# Patient Record
Sex: Male | Born: 1970
Health system: Southern US, Community
[De-identification: ages and names within clinical notes are randomized; demographics above are authoritative.]

## PROBLEM LIST (undated history)

## (undated) DIAGNOSIS — E119 Type 2 diabetes mellitus without complications: Secondary | ICD-10-CM

## (undated) DIAGNOSIS — G43909 Migraine, unspecified, not intractable, without status migrainosus: Secondary | ICD-10-CM

## (undated) HISTORY — DX: Migraine, unspecified, not intractable, without status migrainosus: G43.909

---

## 2006-07-31 ENCOUNTER — Emergency Department (HOSPITAL_COMMUNITY): Admission: EM | Admit: 2006-07-31 | Discharge: 2006-07-31 | Payer: Self-pay | Admitting: Emergency Medicine

## 2007-11-21 ENCOUNTER — Emergency Department (HOSPITAL_COMMUNITY): Admission: EM | Admit: 2007-11-21 | Discharge: 2007-11-21 | Payer: Self-pay | Admitting: Emergency Medicine

## 2013-06-30 ENCOUNTER — Encounter (HOSPITAL_COMMUNITY): Payer: Self-pay | Admitting: Emergency Medicine

## 2013-06-30 ENCOUNTER — Inpatient Hospital Stay (HOSPITAL_COMMUNITY)
Admission: EM | Admit: 2013-06-30 | Discharge: 2013-07-04 | DRG: 639 | Disposition: A | Discharge status: Home / Self Care | Payer: Self-pay | Attending: Internal Medicine | Admitting: Internal Medicine

## 2013-06-30 ENCOUNTER — Emergency Department (INDEPENDENT_AMBULATORY_CARE_PROVIDER_SITE_OTHER)
Admission: EM | Admit: 2013-06-30 | Discharge: 2013-06-30 | Disposition: A | Discharge status: Other Acute Inpatient Hospital | Payer: Self-pay | Source: Home / Self Care | Attending: Family Medicine | Admitting: Family Medicine

## 2013-06-30 DIAGNOSIS — R112 Nausea with vomiting, unspecified: Secondary | ICD-10-CM | POA: Diagnosis present

## 2013-06-30 DIAGNOSIS — IMO0001 Reserved for inherently not codable concepts without codable children: Secondary | ICD-10-CM | POA: Diagnosis present

## 2013-06-30 DIAGNOSIS — E1110 Type 2 diabetes mellitus with ketoacidosis without coma: Secondary | ICD-10-CM | POA: Diagnosis present

## 2013-06-30 DIAGNOSIS — Z833 Family history of diabetes mellitus: Secondary | ICD-10-CM

## 2013-06-30 DIAGNOSIS — E1165 Type 2 diabetes mellitus with hyperglycemia: Secondary | ICD-10-CM

## 2013-06-30 DIAGNOSIS — F17200 Nicotine dependence, unspecified, uncomplicated: Secondary | ICD-10-CM | POA: Diagnosis present

## 2013-06-30 DIAGNOSIS — E785 Hyperlipidemia, unspecified: Secondary | ICD-10-CM | POA: Diagnosis present

## 2013-06-30 DIAGNOSIS — R739 Hyperglycemia, unspecified: Secondary | ICD-10-CM

## 2013-06-30 DIAGNOSIS — E860 Dehydration: Secondary | ICD-10-CM | POA: Diagnosis present

## 2013-06-30 DIAGNOSIS — Z794 Long term (current) use of insulin: Secondary | ICD-10-CM

## 2013-06-30 DIAGNOSIS — D696 Thrombocytopenia, unspecified: Secondary | ICD-10-CM | POA: Diagnosis present

## 2013-06-30 DIAGNOSIS — E1310 Other specified diabetes mellitus with ketoacidosis without coma: Principal | ICD-10-CM | POA: Diagnosis present

## 2013-06-30 DIAGNOSIS — R7309 Other abnormal glucose: Secondary | ICD-10-CM

## 2013-06-30 LAB — URINALYSIS, ROUTINE W REFLEX MICROSCOPIC
Bilirubin Urine: NEGATIVE
Glucose, UA: >1000 mg/dL — AB
Hgb urine dipstick: NEGATIVE
Ketones, ur: >80 mg/dL — AB
Leukocytes, UA: NEGATIVE
Nitrite: NEGATIVE
Protein, ur: NEGATIVE mg/dL
Specific Gravity, Urine: 1.037 — ABNORMAL HIGH (ref 1.005–1.030)
Urobilinogen, UA: 0.2 mg/dL (ref 0.0–1.0)
pH: 5 (ref 5.0–8.0)

## 2013-06-30 LAB — POCT I-STAT, CHEM 8
BUN: 14 mg/dL (ref 6–23)
Calcium, Ion: 1.27 mmol/L — ABNORMAL HIGH (ref 1.12–1.23)
Chloride: 104 mEq/L (ref 96–112)
Creatinine, Ser: 0.9 mg/dL (ref 0.50–1.35)
Glucose, Bld: 352 mg/dL — ABNORMAL HIGH (ref 70–99)
HCT: 58 % — ABNORMAL HIGH (ref 39.0–52.0)
Hemoglobin: 19.7 g/dL — ABNORMAL HIGH (ref 13.0–17.0)
Potassium: 4.8 mEq/L (ref 3.7–5.3)
Sodium: 137 mEq/L (ref 137–147)
TCO2: 15 mmol/L (ref 0–100)

## 2013-06-30 LAB — POCT URINALYSIS DIP (DEVICE)
Glucose, UA: 500 mg/dL — AB
Ketones, ur: 80 mg/dL — AB
Leukocytes, UA: NEGATIVE
Nitrite: NEGATIVE
Protein, ur: 100 mg/dL — AB
Specific Gravity, Urine: >=1.03 (ref 1.005–1.030)
Urobilinogen, UA: 0.2 mg/dL (ref 0.0–1.0)
pH: 5.5 (ref 5.0–8.0)

## 2013-06-30 LAB — POCT I-STAT 3, VENOUS BLOOD GAS (G3P V)
Acid-base deficit: 13 mmol/L — ABNORMAL HIGH (ref 0.0–2.0)
Bicarbonate: 11.7 mEq/L — ABNORMAL LOW (ref 20.0–24.0)
O2 Saturation: 94 %
TCO2: 12 mmol/L (ref 0–100)
pCO2, Ven: 25.7 mmHg — ABNORMAL LOW (ref 45.0–50.0)
pH, Ven: 7.268 (ref 7.250–7.300)
pO2, Ven: 81 mmHg — ABNORMAL HIGH (ref 30.0–45.0)

## 2013-06-30 LAB — CBC WITH DIFFERENTIAL/PLATELET
Basophils Absolute: 0.1 10*3/uL (ref 0.0–0.1)
Basophils Relative: 1 % (ref 0–1)
Eosinophils Absolute: 0.1 10*3/uL (ref 0.0–0.7)
Eosinophils Relative: 1 % (ref 0–5)
HCT: 51.5 % (ref 39.0–52.0)
Hemoglobin: 17.2 g/dL — ABNORMAL HIGH (ref 13.0–17.0)
Lymphocytes Relative: 44 % (ref 12–46)
Lymphs Abs: 2.5 10*3/uL (ref 0.7–4.0)
MCH: 26.9 pg (ref 26.0–34.0)
MCHC: 33.4 g/dL (ref 30.0–36.0)
MCV: 80.5 fL (ref 78.0–100.0)
Monocytes Absolute: 0.4 10*3/uL (ref 0.1–1.0)
Monocytes Relative: 8 % (ref 3–12)
Neutro Abs: 2.6 10*3/uL (ref 1.7–7.7)
Neutrophils Relative %: 46 % (ref 43–77)
Platelets: 150 10*3/uL (ref 150–400)
RBC: 6.4 MIL/uL — ABNORMAL HIGH (ref 4.22–5.81)
RDW: 13.6 % (ref 11.5–15.5)
WBC: 5.6 10*3/uL (ref 4.0–10.5)

## 2013-06-30 LAB — GLUCOSE, CAPILLARY
Glucose-Capillary: 386 mg/dL — ABNORMAL HIGH (ref 70–99)
Glucose-Capillary: 347 mg/dL — ABNORMAL HIGH (ref 70–99)
Glucose-Capillary: 361 mg/dL — ABNORMAL HIGH (ref 70–99)

## 2013-06-30 LAB — COMPREHENSIVE METABOLIC PANEL
ALT: 28 U/L (ref 0–53)
AST: 24 U/L (ref 0–37)
Albumin: 4.5 g/dL (ref 3.5–5.2)
Alkaline Phosphatase: 110 U/L (ref 39–117)
BUN: 14 mg/dL (ref 6–23)
CO2: 12 mEq/L — ABNORMAL LOW (ref 19–32)
Calcium: 9.5 mg/dL (ref 8.4–10.5)
Chloride: 94 mEq/L — ABNORMAL LOW (ref 96–112)
Creatinine, Ser: 0.83 mg/dL (ref 0.50–1.35)
GFR calc Af Amer: >90 mL/min (ref >90)
GFR calc non Af Amer: >90 mL/min (ref >90)
Glucose, Bld: 359 mg/dL — ABNORMAL HIGH (ref 70–99)
Potassium: 5 mEq/L (ref 3.7–5.3)
Sodium: 137 mEq/L (ref 137–147)
Total Bilirubin: 0.6 mg/dL (ref 0.3–1.2)
Total Protein: 8.5 g/dL — ABNORMAL HIGH (ref 6.0–8.3)

## 2013-06-30 LAB — URINE MICROSCOPIC-ADD ON

## 2013-06-30 MED ORDER — SODIUM CHLORIDE 0.9 % IV SOLN
INTRAVENOUS | Status: AC
  Administered 2013-07-01: 19:00:00 via INTRAVENOUS
  Filled 2013-06-30: qty 1

## 2013-06-30 MED ORDER — ENOXAPARIN SODIUM 40 MG/0.4ML ~~LOC~~ SOLN
40.0000 mg | SUBCUTANEOUS | Status: DC
  Administered 2013-07-01 – 2013-07-03 (×3): 40 mg via SUBCUTANEOUS
  Filled 2013-06-30 (×3): qty 0.4

## 2013-06-30 MED ORDER — DEXTROSE 50 % IV SOLN
25.0000 mL | INTRAVENOUS | Status: DC | PRN
Start: 2013-06-30 — End: 2013-07-02

## 2013-06-30 MED ORDER — DEXTROSE-NACL 5-0.45 % IV SOLN: INTRAVENOUS | Status: DC

## 2013-06-30 MED ORDER — SODIUM CHLORIDE 0.9 % IV SOLN
INTRAVENOUS | Status: DC
  Administered 2013-06-30: 2.9 [IU]/h via INTRAVENOUS
  Filled 2013-06-30 (×2): qty 1

## 2013-06-30 MED ORDER — SODIUM CHLORIDE 0.9 % IV SOLN: INTRAVENOUS | Status: DC

## 2013-06-30 MED ORDER — SODIUM CHLORIDE 0.9 % IV BOLUS (SEPSIS)
1000.0000 mL | Freq: Once | INTRAVENOUS | Status: AC
  Administered 2013-06-30: 1000 mL via INTRAVENOUS

## 2013-06-30 MED ORDER — SODIUM CHLORIDE 0.9 % IV SOLN
1000.0000 mL | INTRAVENOUS | Status: DC
  Administered 2013-06-30: 1000 mL via INTRAVENOUS

## 2013-06-30 MED ORDER — SODIUM CHLORIDE 0.9 % IV SOLN
INTRAVENOUS | Status: DC
  Administered 2013-06-30: 23:00:00 via INTRAVENOUS

## 2013-06-30 MED ORDER — POTASSIUM CHLORIDE 10 MEQ/100ML IV SOLN
10.0000 meq | INTRAVENOUS | Status: AC
  Administered 2013-06-30 – 2013-07-01 (×4): 10 meq via INTRAVENOUS
  Filled 2013-06-30 (×4): qty 100

## 2013-06-30 MED ORDER — DEXTROSE-NACL 5-0.45 % IV SOLN
INTRAVENOUS | Status: AC
  Administered 2013-07-01 (×2): via INTRAVENOUS

## 2013-06-30 NOTE — ED Provider Notes (Signed)
CSN: [id number]     Arrival date & time 06/30/13  1502 History   First MD Initiated Contact with Patient 06/30/13 1626     Chief Complaint  Patient presents with  . Emesis   (Consider location/radiation/quality/duration/timing/severity/associated sxs/prior Treatment) HPI Comments: Patient presents with 3 days of nausea, vomiting, loose BMs, polyuria and polydipsia. Denies fever, abdominal pain, known ill contacts. States symptoms are mode worse when he tries to eat or drink. States there is little he can do to make symptoms better. No dysuria or hematuria. No URI sx. Reports difficulty keeping clear liquids down over past 24-48 hours.    The history is provided by the patient.    History reviewed. No pertinent past medical history. History reviewed. No pertinent past surgical history. History reviewed. No pertinent family history. History  Substance Use Topics  . Smoking status: Current Every Day Smoker  . Smokeless tobacco: Not on file  . Alcohol Use: Yes    Review of Systems  Constitutional: Positive for appetite change and fatigue.  HENT: Negative.   Eyes: Negative.   Respiratory: Negative.   Cardiovascular: Negative.   Gastrointestinal: Positive for nausea, vomiting and diarrhea. Negative for constipation and blood in stool.  Endocrine: Negative for polydipsia and polyuria.  Genitourinary: Positive for urgency and frequency. Negative for dysuria and hematuria.  Musculoskeletal: Negative.   Skin: Negative.   Allergic/Immunologic: Negative for immunocompromised state.  Neurological: Positive for dizziness. Negative for seizures, light-headedness and headaches.  Psychiatric/Behavioral: Negative for confusion.    Allergies  Review of patient's allergies indicates no known allergies.  Home Medications  No current outpatient prescriptions on file. There were no vitals taken for this visit. Physical Exam  Nursing note and vitals reviewed. Constitutional: He is oriented to  person, place, and time. He appears well-developed and well-nourished. No distress.  HENT:  Head: Normocephalic and atraumatic.  Nose: Nose normal.  Mouth/Throat: Oropharynx is clear and moist.  Eyes: Conjunctivae are normal. No scleral icterus.  Neck: Normal range of motion. Neck supple.  Cardiovascular: Regular rhythm and normal heart sounds.   Mild tachycardia  Pulmonary/Chest: Effort normal and breath sounds normal. No respiratory distress. He has no wheezes.  Abdominal: Soft. He exhibits no distension. There is no tenderness.  +hypoactive BS  Musculoskeletal: Normal range of motion.  Neurological: He is alert and oriented to person, place, and time.  Skin: Skin is warm and dry.  Psychiatric: He has a normal mood and affect. His behavior is normal.    ED Course  Procedures (including critical care time) Labs Review Labs Reviewed - No data to display Imaging Review No results found.    MDM  History and labs and suggest new onset diabetes with dehydration and ketonuria. Patient states he is not surprised given that his four brothers are diabetic patients. Will transfer to [facility] for re-hydration and glucose stabilization. Given outpatient resource information prior to transfer.     Jess Barters [place], [place] 06/30/13 1740

## 2013-06-30 NOTE — H&P (Signed)
[facility] History and Physical  Joshua Escobar DOB: Apr 23, 1971 DOA: 06/30/2013  Referring physician: ER physician. PCP: No PCP Per Patient   Chief Complaint: Polydipsia and polyuria.  HPI: Joshua Escobar is a 43 y.o. male with no significant past medical history presented to the urgent care at [facility] with complaints of polyuria and polydipsia. Patient has been experiencing these symptoms for a week. Patient also has nausea vomiting yesterday. Denies any chest pain shortness of breath abdominal pain or diarrhea. Patient's blood sugar was found to be elevated and was referred to the ER. In the ER patient's labs show elevated anion gap with elevated blood sugar and is being admitted for diabetic ketoacidosis and new-onset diabetes mellitus type 2.   Review of Systems: As presented in the history of presenting illness, rest negative.  Past Medical History  Diagnosis Date  . Medical history non-contributory    Past Surgical History  Procedure Laterality Date  . No past surgeries     Social History:  reports that he has been smoking.  He does not have any smokeless tobacco history on file. He reports that he drinks alcohol. His drug history is not on file. Where does patient live home. Can patient participate in ADLs? Yes.  No Known Allergies  Family History:  Family History  Problem Relation Age of Onset  . Diabetes Mellitus II Sister   . Diabetes Mellitus II Brother       Prior to Admission medications   Not on File    Physical Exam: Filed Vitals:   06/30/13 2045 06/30/13 2115 06/30/13 2144 06/30/13 2145  BP: 131/92 137/99 131/91 128/89  Pulse: 93 95 92 90  Temp:      TempSrc:      Resp:   20   Weight:      SpO2: 98% 97% 100% 100%     General:  Well-developed and nourished.  Eyes: Anicteric no pallor.  ENT: No discharge from ears eyes nose mouth.  Neck: No mass felt.  Cardiovascular: S1-S2 heard.  Respiratory: No rhonchi  or crepitations.  Abdomen: Soft nontender bowel sounds present.  Skin: No rash.  Musculoskeletal: No edema.  Psychiatric: Appears normal.  Neurologic: Alert awake oriented to time place and person. Moves all extremities.  Labs on Admission:  Basic Metabolic Panel:  Recent Labs Lab 06/30/13 1702 06/30/13 1805  NA 137 137  K 4.8 5.0  CL 104 94*  CO2  --  12*  GLUCOSE 352* 359*  BUN 14 14  CREATININE 0.90 0.83  CALCIUM  --  9.5   Liver Function Tests:  Recent Labs Lab 06/30/13 1805  AST 24  ALT 28  ALKPHOS 110  BILITOT 0.6  PROT 8.5*  ALBUMIN 4.5   No results found for this basename: LIPASE, AMYLASE,  in the last 168 hours No results found for this basename: AMMONIA,  in the last 168 hours CBC:  Recent Labs Lab 06/30/13 1702 06/30/13 1805  WBC  --  5.6  NEUTROABS  --  2.6  HGB 19.7* 17.2*  HCT 58.0* 51.5  MCV  --  80.5  PLT  --  150   Cardiac Enzymes: No results found for this basename: CKTOTAL, CKMB, CKMBINDEX, TROPONINI,  in the last 168 hours  BNP (last 3 results) No results found for this basename: PROBNP,  in the last 8760 hours CBG:  Recent Labs Lab 06/30/13 1803 06/30/13 2132 06/30/13 2229  GLUCAP 386* 361* 347*  Radiological Exams on Admission: No results found.   Assessment/Plan Principal Problem:   Diabetic ketoacidosis Active Problems:   Nausea with vomiting   DKA (diabetic ketoacidoses)   1. DKA with new-onset diabetes mellitus - probably diabetes mellitus type 2. Patient has been placed on IV insulin infusion with aggressive IV hydration. Change to subcutaneous Lantus once anion gap gets corrected. I have ordered anti-glutamic acid decarboxylase antibodies and C-peptide to differentiate type I and type 2 diabetes mellitus. Closely follow metabolic panel. Check hemoglobin A1c. Lipid panel. TSH. 2. Nausea vomiting probably from diabetes mellitus - patient abdomen appears benign. Check LFTs. 3. Tobacco abuse - strongly  advised to quit smoking.    Code Status: Full code.  Family Communication: None.  Disposition Plan: Admit to inpatient.    KAKRAKANDY,ARSHAD N. [facility] Pager [phone].  If 7PM-7AM, please contact night-coverage [email or web address] Password [email or web address] 06/30/2013, 10:37 PM

## 2013-06-30 NOTE — ED Notes (Addendum)
C/o vomiting x past 3 days, reportedly unable to keep down anything . Last stool 2-3 days ago. NAD at present. Denies pain

## 2013-06-30 NOTE — ED Provider Notes (Signed)
CSN: [id number]     Arrival date & time 06/30/13  1737 History   First MD Initiated Contact with Patient 06/30/13 2000     Chief Complaint  Patient presents with  . Emesis   (Consider location/radiation/quality/duration/timing/severity/associated sxs/prior Treatment) Patient is a 43 y.o. male presenting with vomiting. The history is provided by the patient.  Emesis  patient here complaining of 3 days of worsening polyuria, polydipsia as well as emesis. Went to urgent care Center and was diagnosed with new-onset diabetes. He does note recent weight loss. Has a strong family history of diabetes. Currently denies any diarrhea. Does note some weakness. No urinary symptoms of dysuria or hematuria. No vomiting for the past 48 hours. Denies any abdominal pain. No treatment used prior to arrival and nothing makes her symptoms better worse in no prior history of same.  History reviewed. No pertinent past medical history. History reviewed. No pertinent past surgical history. History reviewed. No pertinent family history. History  Substance Use Topics  . Smoking status: Current Every Day Smoker  . Smokeless tobacco: Not on file  . Alcohol Use: Yes    Review of Systems  Gastrointestinal: Positive for vomiting.  All other systems reviewed and are negative.    Allergies  Review of patient's allergies indicates no known allergies.  Home Medications  No current outpatient prescriptions on file. BP 140/92  Pulse 100  Temp(Src) 98.3 F (36.8 C) (Oral)  Resp 20  Wt 183 lb 7 oz (83.207 kg)  SpO2 100% Physical Exam  Nursing note and vitals reviewed. Constitutional: He is oriented to person, place, and time. He appears well-developed and well-nourished.  Non-toxic appearance. No distress.  HENT:  Head: Normocephalic and atraumatic.  Eyes: Conjunctivae, EOM and lids are normal. Pupils are equal, round, and reactive to light.  Neck: Normal range of motion. Neck supple. No tracheal deviation  present. No mass present.  Cardiovascular: Regular rhythm and normal heart sounds.  Tachycardia present.  Exam reveals no gallop.   No murmur heard. Pulmonary/Chest: Effort normal and breath sounds normal. No stridor. No respiratory distress. He has no decreased breath sounds. He has no wheezes. He has no rhonchi. He has no rales.  Abdominal: Soft. Normal appearance and bowel sounds are normal. He exhibits no distension. There is no tenderness. There is no rebound and no CVA tenderness.  Musculoskeletal: Normal range of motion. He exhibits no edema and no tenderness.  Neurological: He is alert and oriented to person, place, and time. He has normal strength. No cranial nerve deficit or sensory deficit. GCS eye subscore is 4. GCS verbal subscore is 5. GCS motor subscore is 6.  Skin: Skin is warm and dry. No abrasion and no rash noted.  Psychiatric: He has a normal mood and affect. His speech is normal and behavior is normal.    ED Course  Procedures (including critical care time) Labs Review Labs Reviewed  COMPREHENSIVE METABOLIC PANEL - Abnormal; Notable for the following:    Chloride 94 (*)    CO2 12 (*)    Glucose, Bld 359 (*)    Total Protein 8.5 (*)    All other components within normal limits  CBC WITH DIFFERENTIAL - Abnormal; Notable for the following:    RBC 6.40 (*)    Hemoglobin 17.2 (*)    All other components within normal limits  GLUCOSE, CAPILLARY - Abnormal; Notable for the following:    Glucose-Capillary 386 (*)    All other components within normal limits  URINALYSIS,  ROUTINE W REFLEX MICROSCOPIC  BLOOD GAS, VENOUS   Imaging Review No results found.  EKG Interpretation   None       MDM  No diagnosis found. Patient given IV fluids here and will be started on a glucose stabilizer and admitted to step down   Toy Baker, MD 06/30/13 2120

## 2013-06-30 NOTE — ED Provider Notes (Signed)
Medical screening examination/treatment/procedure(s) were performed by resident physician or non-physician practitioner and as supervising physician I was immediately available for consultation/collaboration.   KINDL,JAMES DOUGLAS MD.   James D Kindl, MD 06/30/13 2057 

## 2013-06-30 NOTE — ED Notes (Addendum)
Presents from urgent care center with increased urination, thirst and weight loss assocaited with nausea and emesis over the past 3 days. Seen at [facility] and told he was diabetic, sent here for further work up. Alert, oriented, answering all questions appropriately.

## 2013-06-30 NOTE — Progress Notes (Signed)
[facility] ED CM noted patient to be without a [facility]. Pt presented to ED from Urgent Care with s/s of DM for further evaluation. In room to meet with patient. Pt reports not having a PCP but he does have health insurance through his employer [employer].  Pt informed that he will need to call [employer] number is on the card to find a PCP in the network. Pt verbalized understanding and agrees with plan. Further evaluation pending. Disposition plan pending further eval. Pt was admitted 5W

## 2013-07-01 LAB — BASIC METABOLIC PANEL
BUN: 12 mg/dL (ref 6–23)
BUN: 10 mg/dL (ref 6–23)
BUN: 10 mg/dL (ref 6–23)
BUN: 11 mg/dL (ref 6–23)
BUN: 9 mg/dL (ref 6–23)
BUN: 8 mg/dL (ref 6–23)
CO2: 15 mEq/L — ABNORMAL LOW (ref 19–32)
CO2: 19 mEq/L (ref 19–32)
CO2: 19 mEq/L (ref 19–32)
CO2: 20 mEq/L (ref 19–32)
CO2: 24 mEq/L (ref 19–32)
CO2: 20 mEq/L (ref 19–32)
Calcium: 9 mg/dL (ref 8.4–10.5)
Calcium: 9 mg/dL (ref 8.4–10.5)
Calcium: 8.8 mg/dL (ref 8.4–10.5)
Calcium: 8.8 mg/dL (ref 8.4–10.5)
Calcium: 8.9 mg/dL (ref 8.4–10.5)
Calcium: 8.8 mg/dL (ref 8.4–10.5)
Chloride: 101 mEq/L (ref 96–112)
Chloride: 106 mEq/L (ref 96–112)
Chloride: 103 mEq/L (ref 96–112)
Chloride: 98 mEq/L (ref 96–112)
Chloride: 101 mEq/L (ref 96–112)
Chloride: 97 mEq/L (ref 96–112)
Creatinine, Ser: 0.9 mg/dL (ref 0.50–1.35)
Creatinine, Ser: 0.75 mg/dL (ref 0.50–1.35)
Creatinine, Ser: 0.74 mg/dL (ref 0.50–1.35)
Creatinine, Ser: 0.8 mg/dL (ref 0.50–1.35)
Creatinine, Ser: 0.73 mg/dL (ref 0.50–1.35)
Creatinine, Ser: 0.7 mg/dL (ref 0.50–1.35)
GFR calc Af Amer: >90 mL/min (ref >90)
GFR calc Af Amer: >90 mL/min (ref >90)
GFR calc Af Amer: >90 mL/min (ref >90)
GFR calc Af Amer: >90 mL/min (ref >90)
GFR calc Af Amer: >90 mL/min (ref >90)
GFR calc Af Amer: >90 mL/min (ref >90)
GFR calc non Af Amer: >90 mL/min (ref >90)
GFR calc non Af Amer: >90 mL/min (ref >90)
GFR calc non Af Amer: >90 mL/min (ref >90)
GFR calc non Af Amer: >90 mL/min (ref >90)
GFR calc non Af Amer: >90 mL/min (ref >90)
GFR calc non Af Amer: >90 mL/min (ref >90)
Glucose, Bld: 282 mg/dL — ABNORMAL HIGH (ref 70–99)
Glucose, Bld: 136 mg/dL — ABNORMAL HIGH (ref 70–99)
Glucose, Bld: 153 mg/dL — ABNORMAL HIGH (ref 70–99)
Glucose, Bld: 206 mg/dL — ABNORMAL HIGH (ref 70–99)
Glucose, Bld: 109 mg/dL — ABNORMAL HIGH (ref 70–99)
Glucose, Bld: 110 mg/dL — ABNORMAL HIGH (ref 70–99)
Potassium: 4.4 mEq/L (ref 3.7–5.3)
Potassium: 3.9 mEq/L (ref 3.7–5.3)
Potassium: 4.2 mEq/L (ref 3.7–5.3)
Potassium: 3.7 mEq/L (ref 3.7–5.3)
Potassium: 3.7 mEq/L (ref 3.7–5.3)
Potassium: 3.4 mEq/L — ABNORMAL LOW (ref 3.7–5.3)
Sodium: 141 mEq/L (ref 137–147)
Sodium: 143 mEq/L (ref 137–147)
Sodium: 139 mEq/L (ref 137–147)
Sodium: 135 mEq/L — ABNORMAL LOW (ref 137–147)
Sodium: 136 mEq/L — ABNORMAL LOW (ref 137–147)
Sodium: 134 mEq/L — ABNORMAL LOW (ref 137–147)

## 2013-07-01 LAB — CBC
HCT: 46.8 % (ref 39.0–52.0)
Hemoglobin: 15.2 g/dL (ref 13.0–17.0)
MCH: 26.3 pg (ref 26.0–34.0)
MCHC: 32.5 g/dL (ref 30.0–36.0)
MCV: 80.8 fL (ref 78.0–100.0)
Platelets: 132 10*3/uL — ABNORMAL LOW (ref 150–400)
RBC: 5.79 MIL/uL (ref 4.22–5.81)
RDW: 13.5 % (ref 11.5–15.5)
WBC: 5.5 10*3/uL (ref 4.0–10.5)

## 2013-07-01 LAB — GLUCOSE, CAPILLARY
Glucose-Capillary: 121 mg/dL — ABNORMAL HIGH (ref 70–99)
Glucose-Capillary: 127 mg/dL — ABNORMAL HIGH (ref 70–99)
Glucose-Capillary: 137 mg/dL — ABNORMAL HIGH (ref 70–99)
Glucose-Capillary: 143 mg/dL — ABNORMAL HIGH (ref 70–99)
Glucose-Capillary: 143 mg/dL — ABNORMAL HIGH (ref 70–99)
Glucose-Capillary: 143 mg/dL — ABNORMAL HIGH (ref 70–99)
Glucose-Capillary: 153 mg/dL — ABNORMAL HIGH (ref 70–99)
Glucose-Capillary: 163 mg/dL — ABNORMAL HIGH (ref 70–99)
Glucose-Capillary: 169 mg/dL — ABNORMAL HIGH (ref 70–99)
Glucose-Capillary: 184 mg/dL — ABNORMAL HIGH (ref 70–99)
Glucose-Capillary: 185 mg/dL — ABNORMAL HIGH (ref 70–99)
Glucose-Capillary: 191 mg/dL — ABNORMAL HIGH (ref 70–99)
Glucose-Capillary: 214 mg/dL — ABNORMAL HIGH (ref 70–99)
Glucose-Capillary: 223 mg/dL — ABNORMAL HIGH (ref 70–99)
Glucose-Capillary: 233 mg/dL — ABNORMAL HIGH (ref 70–99)
Glucose-Capillary: 275 mg/dL — ABNORMAL HIGH (ref 70–99)
Glucose-Capillary: 296 mg/dL — ABNORMAL HIGH (ref 70–99)
Glucose-Capillary: 299 mg/dL — ABNORMAL HIGH (ref 70–99)
Glucose-Capillary: 89 mg/dL (ref 70–99)
Glucose-Capillary: 89 mg/dL (ref 70–99)
Glucose-Capillary: 90 mg/dL (ref 70–99)
Glucose-Capillary: 98 mg/dL (ref 70–99)
Glucose-Capillary: 99 mg/dL (ref 70–99)

## 2013-07-01 LAB — COMPREHENSIVE METABOLIC PANEL
ALT: 22 U/L (ref 0–53)
AST: 17 U/L (ref 0–37)
Albumin: 3.6 g/dL (ref 3.5–5.2)
Alkaline Phosphatase: 87 U/L (ref 39–117)
BUN: 11 mg/dL (ref 6–23)
CO2: 20 mEq/L (ref 19–32)
Calcium: 8.9 mg/dL (ref 8.4–10.5)
Chloride: 102 mEq/L (ref 96–112)
Creatinine, Ser: 0.92 mg/dL (ref 0.50–1.35)
GFR calc Af Amer: >90 mL/min (ref >90)
GFR calc non Af Amer: >90 mL/min (ref >90)
Glucose, Bld: 203 mg/dL — ABNORMAL HIGH (ref 70–99)
Potassium: 4 mEq/L (ref 3.7–5.3)
Sodium: 140 mEq/L (ref 137–147)
Total Bilirubin: 0.5 mg/dL (ref 0.3–1.2)
Total Protein: 6.8 g/dL (ref 6.0–8.3)

## 2013-07-01 LAB — LIPID PANEL
Cholesterol: 205 mg/dL — ABNORMAL HIGH (ref 0–200)
HDL: 43 mg/dL (ref >39)
LDL Cholesterol: 145 mg/dL — ABNORMAL HIGH (ref 0–99)
Total CHOL/HDL Ratio: 4.8 RATIO
Triglycerides: 86 mg/dL (ref <150)
VLDL: 17 mg/dL (ref 0–40)

## 2013-07-01 LAB — CBC WITH DIFFERENTIAL/PLATELET
Basophils Absolute: 0 10*3/uL (ref 0.0–0.1)
Basophils Relative: 0 % (ref 0–1)
Eosinophils Absolute: 0.1 10*3/uL (ref 0.0–0.7)
Eosinophils Relative: 2 % (ref 0–5)
HCT: 44.7 % (ref 39.0–52.0)
Hemoglobin: 15.1 g/dL (ref 13.0–17.0)
Lymphocytes Relative: 49 % — ABNORMAL HIGH (ref 12–46)
Lymphs Abs: 2.8 10*3/uL (ref 0.7–4.0)
MCH: 27.1 pg (ref 26.0–34.0)
MCHC: 33.8 g/dL (ref 30.0–36.0)
MCV: 80.3 fL (ref 78.0–100.0)
Monocytes Absolute: 0.6 10*3/uL (ref 0.1–1.0)
Monocytes Relative: 11 % (ref 3–12)
Neutro Abs: 2.2 10*3/uL (ref 1.7–7.7)
Neutrophils Relative %: 38 % — ABNORMAL LOW (ref 43–77)
Platelets: 132 10*3/uL — ABNORMAL LOW (ref 150–400)
RBC: 5.57 MIL/uL (ref 4.22–5.81)
RDW: 13.3 % (ref 11.5–15.5)
WBC: 5.7 10*3/uL (ref 4.0–10.5)

## 2013-07-01 LAB — TROPONIN I: Troponin I: <0.3 ng/mL (ref <0.30)

## 2013-07-01 LAB — HEMOGLOBIN A1C
Hgb A1c MFr Bld: 13.2 % — ABNORMAL HIGH (ref <5.7)
Mean Plasma Glucose: 332 mg/dL — ABNORMAL HIGH (ref <117)

## 2013-07-01 LAB — TSH: TSH: 1.356 u[IU]/mL (ref 0.350–4.500)

## 2013-07-01 LAB — MRSA PCR SCREENING: MRSA by PCR: NEGATIVE

## 2013-07-01 MED ORDER — INSULIN DETEMIR 100 UNIT/ML ~~LOC~~ SOLN
10.0000 [IU] | Freq: Once | SUBCUTANEOUS | Status: AC
  Administered 2013-07-01: 10 [IU] via SUBCUTANEOUS
  Filled 2013-07-01: qty 0.1

## 2013-07-01 MED ORDER — LIVING WELL WITH DIABETES BOOK
Freq: Once | Status: AC
  Administered 2013-07-01: 11:00:00
  Filled 2013-07-01: qty 1

## 2013-07-01 MED ORDER — POTASSIUM CHLORIDE 10 MEQ/100ML IV SOLN
10.0000 meq | INTRAVENOUS | Status: AC
  Administered 2013-07-01 (×2): 10 meq via INTRAVENOUS
  Filled 2013-07-01 (×2): qty 100

## 2013-07-01 MED ORDER — POTASSIUM CHLORIDE 10 MEQ/100ML IV SOLN
10.0000 meq | INTRAVENOUS | Status: AC
  Administered 2013-07-01 – 2013-07-02 (×3): 10 meq via INTRAVENOUS
  Filled 2013-07-01 (×3): qty 100

## 2013-07-01 NOTE — Progress Notes (Signed)
Utilization review completed.  

## 2013-07-01 NOTE — Plan of Care (Signed)
Problem: Food- and Nutrition-Related Knowledge Deficit (NB-1.1) Goal: Nutrition education Formal process to instruct or train a patient/client in a skill or to impart knowledge to help patients/clients voluntarily manage or modify food choices and eating behavior to maintain or improve health. Nutrition Education Note  Dietetic intern consulted for nutrition education regarding new onset diabetes.  Dietetic intern provided "Carbohydrate Counting for people with diabetes" handout from the Academy of Nutrition and Dietetics. Reviewed patient's dietary recall. Discouraged intake of sugar sweetened beverages and concentrated sweets. Patient reported eating 2 meals a day. Dietetic intern encouraged patient to incorporated 2-3 small snacks throughout the day. Dietetic intern reviewed carbohydrate containing foods.   Dietetic intern discussed why it is important for patient to adhere to diet recommendations and emphasized the role carbohydrates in blood glucose control. Patient was accepting to education. Teach back method used.  Expect fair compliance.  Current diet order is clear liquids, no PO intake has been recorded. Labs and medications reviewed. No further nutrition interventions warranted at this time. If additional nutrition issues arise, please re-consult RD.  Marlane Mingle, Dietetic Intern Pager: [phone]    I agree with the above information and made appropriate revisions. Jarold Motto MS, RD, LDN Pager: [phone] After-hours pager: [phone]

## 2013-07-01 NOTE — Progress Notes (Addendum)
Inpatient Diabetes Program Recommendations  AACE/ADA: New Consensus Statement on Inpatient Glycemic Control (2013)  Target Ranges:  Prepandial:   less than 140 mg/dL      Peak postprandial:   less than 180 mg/dL (1-2 hours)      Critically ill patients:  140 - 180 mg/dL   No Bmet since mid-day today. Gap at that time was at 17. Pt still on IV insulin drip. Glucose increased after a small portion of clear liquids without carb coverage. Requested RN to check order for Bmet and she ordered and called lab for a bmet asap.  Inpatient Diabetes Program Recommendations Insulin - Basal: Will need start with 20 units lantus/levemir 2 hrs before the insulin drip is discontinued. Correction (SSI): Will need to start the correction at the same time that the drip is d/c'd Recommend starting with moderate correction q 4hrs while not eating well. RN states pt is very receptive to teaching.  Will talk with pt more tomorrow when hopefully feeling better. Added carb modified to existing diet orders [place] [facility]   Ad: talked a while with patient.  He states that his brother has type 1 dm on insulin.  I am unsure if he is referring to insulin requiring vs insulin dependent.  Pt states he will quit smoking as his daughters will be happy. Pt lives alone with daughters close by.

## 2013-07-01 NOTE — Progress Notes (Addendum)
Inpatient Diabetes Program Recommendations  AACE/ADA: New Consensus Statement on Inpatient Glycemic Control (2013)  Target Ranges:  Prepandial:   less than 140 mg/dL      Peak postprandial:   less than 180 mg/dL (1-2 hours)      Critically ill patients:  140 - 180 mg/dL  I Ad @ 0263: Noted GAP still high at 24 It is recommended per ADA guidelines to continue IV insulin until GAP is normalized.  Inpatient Diabetes Program Recommendations Insulin - Basal: Will need start with 20 units lantus/levemir 2 hrs before the insulin drip is discontinued. Correction (SSI): Will need to start the correction at the same time that the drip is d/c'd  New onset DM: Inpatient Diabetes Program Recommendations Insulin - Basal: Will need start with 20 units lantus/levemir 2 hrs before the insulin drip is discontinued. Correction (SSI): Will need to start the correction at the same time that the drip is d/c'd According to the last bmet at 0400 this am, pt still has GAP of 18, C02 of 19 and K+ of 3.9. Assume next bmet will be ordered within the next 3-4 hrs before decision to stop the IV insulin drip. Noted that the GAD antibody ordered as well as a C-peptide, however C-peptide will probably be low due to stress of insulin needs/resistance prior to diagnosis. Will follow and glad to assist in any way.  Will order education per bedside RN/staff using system network videos, mosby notes, ed booklet as well as RD consult. Thank you, Lenor Coffin, RN, CNS, Diabetes Coordinator [phone])

## 2013-07-01 NOTE — Progress Notes (Addendum)
[facility] Progress Note [place] TEAM 1 - Stepdown/ICU TEAM   Joshua NapKenneth A Takacs [id number] DOB: 01-15-1971 DOA: 06/30/2013 PCP: No PCP Per Patient  Brief narrative: Joshua Escobar is a 43 y.o. male with no significant past medical history [facility]  with complaints of polyuria and polydipsia. Patient has been experiencing these symptoms for a week. Patient also has nausea vomiting yesterday. Denies any chest pain shortness of breath abdominal pain or diarrhea. Patient's blood sugar was found to be elevated and was referred to the ER. In the ER patient's labs show elevated anion gap with elevated blood sugar and is being admitted for diabetic ketoacidosis and new-onset diabetes mellitus type 2.    Subjective: No complaints- overall feels better.   Assessment/Plan: Principal Problem:   Diabetic ketoacidosis - anion gap still not closed- cont Q6 bmets - given a dose of Lantus 10 U - cont Insulin infusion as well. - f/u A1c  Active Problems:   Nausea with vomiting - resolved- DKA vs Viral gastroenteritis - non-caloric clears   HLP - start Statin  Thrombocytopenia - follow   Dehydration - cont IVF   Code Status: full code  Family Communication: none Disposition Plan: follow in SDU  Consultants: none  Procedures: none  Antibiotics: none  DVT prophylaxis: Lovenox  Objective: Blood pressure 103/59, pulse 78, temperature 98.4 F (36.9 C), temperature source Oral, resp. rate 16, height 5\' 8"  (1.727 m), weight 83.2 kg (183 lb 6.8 oz), SpO2 99.00%.  Intake/Output Summary (Last 24 hours) at 07/01/13 1506 Last data filed at 07/01/13 0817  Gross per 24 hour  Intake 1678.33 ml  Output    850 ml  Net 828.33 ml     Exam: General: No acute respiratory distress Lungs: Clear to auscultation bilaterally without wheezes or crackles Cardiovascular: Regular rate and rhythm without murmur gallop or rub normal S1 and  S2 Abdomen: Nontender, nondistended, soft, bowel sounds positive, no rebound, no ascites, no appreciable mass Extremities: No significant cyanosis, clubbing, or edema bilateral lower extremities  Data Reviewed: Basic Metabolic Panel:  Recent Labs Lab 06/30/13 2356 07/01/13 0230 07/01/13 0440 07/01/13 0848 07/01/13 1245  NA 141 140 143 139 135*  K 4.4 4.0 3.9 4.2 3.7  CL 101 102 106 103 98  CO2 15* 20 19 19 20   GLUCOSE 282* 203* 136* 153* 206*  BUN 12 11 10 10 11   CREATININE 0.90 0.92 0.75 0.74 0.80  CALCIUM 9.0 8.9 9.0 8.8 8.8   Liver Function Tests:  Recent Labs Lab 06/30/13 1805 07/01/13 0230  AST 24 17  ALT 28 22  ALKPHOS 110 87  BILITOT 0.6 0.5  PROT 8.5* 6.8  ALBUMIN 4.5 3.6   No results found for this basename: LIPASE, AMYLASE,  in the last 168 hours No results found for this basename: AMMONIA,  in the last 168 hours CBC:  Recent Labs Lab 06/30/13 1702 06/30/13 1805 06/30/13 2356 07/01/13 0230  WBC  --  5.6 5.5 5.7  NEUTROABS  --  2.6  --  2.2  HGB 19.7* 17.2* 15.2 15.1  HCT 58.0* 51.5 46.8 44.7  MCV  --  80.5 80.8 80.3  PLT  --  150 132* 132*   Cardiac Enzymes:  Recent Labs Lab 06/30/13 2356  TROPONINI <0.30   BNP (last 3 results) No results found for this basename: PROBNP,  in the last 8760 hours CBG:  Recent Labs Lab 07/01/13 0914 07/01/13 1018 07/01/13 1130 07/01/13 1235  07/01/13 1339  GLUCAP 169* 163* 191* 214* 184*    Recent Results (from the past 240 hour(s))  MRSA PCR SCREENING     Status: None   Collection Time    06/30/13 11:29 PM      Result Value Range Status   MRSA by PCR NEGATIVE  NEGATIVE Final   Comment:            The GeneXpert MRSA Assay (FDA     approved for NASAL specimens     only), is one component of a     comprehensive MRSA colonization     surveillance program. It is not     intended to diagnose MRSA     infection nor to guide or     monitor treatment for     MRSA infections.      Studies:  Recent x-ray studies have been reviewed in detail by the Attending Physician  Scheduled Meds:  Scheduled Meds: . enoxaparin (LOVENOX) injection  40 mg Subcutaneous Q24H   Continuous Infusions: . sodium chloride Stopped (07/01/13 0052)  . dextrose 5 % and 0.45% NaCl 100 mL/hr at 07/01/13 0052  . insulin (NOVOLIN-R) infusion 5 Units/hr (07/01/13 1341)    Time spent on care of this patient: >35 min   Calvert Cantor, MD  [facility] Office  [phone] Pager - Text Page per Amion as per below:  On-Call/Text Page:      [email or web address]      password [email or web address]  If 7PM-7AM, please contact night-coverage [email or web address] Password [email or web address] 07/01/2013, 3:06 PM   LOS: 1 day

## 2013-07-01 NOTE — Progress Notes (Signed)
Nutrition Brief Note  Patient identified on the Malnutrition Screening Tool (MST) Report  Patient reports weight loss, but unable to verify this. However, suspect weight loss related to new onset of diabetes. Patient appears well nourished.   Body mass index is 27.9 kg/(m^2).  Current diet order is clear liquids. No PO intake currently recorded. Labs and medications reviewed. No further nutrition interventions warranted at this time. If additional nutrition issues arise, please re-consult RD.   Marlane Mingle, Dietetic Intern Pager: [phone]  I agree with the above information and made appropriate revisions. Jarold Motto MS, RD, LDN Pager: [phone] After-hours pager: [phone]

## 2013-07-01 NOTE — Care Management Note (Signed)
    Page 1 of 2   07/04/2013     2:28:25 PM   CARE MANAGEMENT NOTE 07/04/2013  Patient:  NYHEEM, MADURO   Account Number:  [id number]  Date Initiated:  07/01/2013  Documentation initiated by:  Donn Pierini  Subjective/Objective Assessment:   Pt admitted with DKA     Action/Plan:   PTA pt lived at home- anticipate return home   Anticipated DC Date:  07/03/2013   Anticipated DC Plan:  HOME/SELF CARE      DC Planning Services  CM consult  MATCH Program      Choice offered to / List presented to:             Status of service:  Completed, signed off Medicare Important Message given?   (If response is "NO", the following Medicare IM given date fields will be blank) Date Medicare IM given:   Date Additional Medicare IM given:    Discharge Disposition:  HOME/SELF CARE  Per UR Regulation:  Reviewed for med. necessity/level of care/duration of stay  If discussed at Long Length of Stay Meetings, dates discussed:    Comments:  07/04/13 14:25 Letha Cape RN, BSN  [phone] patient for dc today, he has not signed papers for his insurance yet so it will not be active until he signs it, he will need insulin pens today at dc, lantus pens are very expensive , he thought he would be able to afford them but the diabetic educator states they will run $400. NCM assisted patient with the Match Program.  07/01/13- 1100- Donn Pierini RN, BSN [phone] Referral received for DM and PCP needs- in to speak with pt at bedside- per conversation pt states that he works for [employer] and that he does have insurance- he is awaiting his insurance card in the mail (he believes his coverage is with [employer]) discussed PCP options including calling his insurance provider and [facility]- pt understands and will f/u to find a PCP- will provide [facility] # on d/c instructions to further assist. Call made to [facility] to see if they could assist in verifying pt's coverage through [facility]- awaiting return  call. 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

## 2013-07-02 LAB — GLUCOSE, CAPILLARY
Glucose-Capillary: 106 mg/dL — ABNORMAL HIGH (ref 70–99)
Glucose-Capillary: 123 mg/dL — ABNORMAL HIGH (ref 70–99)
Glucose-Capillary: 129 mg/dL — ABNORMAL HIGH (ref 70–99)
Glucose-Capillary: 132 mg/dL — ABNORMAL HIGH (ref 70–99)
Glucose-Capillary: 142 mg/dL — ABNORMAL HIGH (ref 70–99)
Glucose-Capillary: 147 mg/dL — ABNORMAL HIGH (ref 70–99)
Glucose-Capillary: 148 mg/dL — ABNORMAL HIGH (ref 70–99)
Glucose-Capillary: 149 mg/dL — ABNORMAL HIGH (ref 70–99)
Glucose-Capillary: 157 mg/dL — ABNORMAL HIGH (ref 70–99)
Glucose-Capillary: 158 mg/dL — ABNORMAL HIGH (ref 70–99)
Glucose-Capillary: 162 mg/dL — ABNORMAL HIGH (ref 70–99)
Glucose-Capillary: 181 mg/dL — ABNORMAL HIGH (ref 70–99)
Glucose-Capillary: 183 mg/dL — ABNORMAL HIGH (ref 70–99)
Glucose-Capillary: 198 mg/dL — ABNORMAL HIGH (ref 70–99)
Glucose-Capillary: 218 mg/dL — ABNORMAL HIGH (ref 70–99)
Glucose-Capillary: 218 mg/dL — ABNORMAL HIGH (ref 70–99)
Glucose-Capillary: 247 mg/dL — ABNORMAL HIGH (ref 70–99)
Glucose-Capillary: 78 mg/dL (ref 70–99)
Glucose-Capillary: 87 mg/dL (ref 70–99)

## 2013-07-02 LAB — BASIC METABOLIC PANEL
BUN: 8 mg/dL (ref 6–23)
BUN: 7 mg/dL (ref 6–23)
BUN: 6 mg/dL (ref 6–23)
BUN: 6 mg/dL (ref 6–23)
BUN: 6 mg/dL (ref 6–23)
CO2: 22 mEq/L (ref 19–32)
CO2: 20 mEq/L (ref 19–32)
CO2: 22 mEq/L (ref 19–32)
CO2: 21 mEq/L (ref 19–32)
CO2: 23 mEq/L (ref 19–32)
Calcium: 8.8 mg/dL (ref 8.4–10.5)
Calcium: 8.7 mg/dL (ref 8.4–10.5)
Calcium: 8.5 mg/dL (ref 8.4–10.5)
Calcium: 8.7 mg/dL (ref 8.4–10.5)
Calcium: 8.4 mg/dL (ref 8.4–10.5)
Chloride: 97 mEq/L (ref 96–112)
Chloride: 99 mEq/L (ref 96–112)
Chloride: 100 mEq/L (ref 96–112)
Chloride: 102 mEq/L (ref 96–112)
Chloride: 101 mEq/L (ref 96–112)
Creatinine, Ser: 0.78 mg/dL (ref 0.50–1.35)
Creatinine, Ser: 0.74 mg/dL (ref 0.50–1.35)
Creatinine, Ser: 0.75 mg/dL (ref 0.50–1.35)
Creatinine, Ser: 0.62 mg/dL (ref 0.50–1.35)
Creatinine, Ser: 0.76 mg/dL (ref 0.50–1.35)
GFR calc Af Amer: >90 mL/min (ref >90)
GFR calc Af Amer: >90 mL/min (ref >90)
GFR calc Af Amer: >90 mL/min (ref >90)
GFR calc Af Amer: >90 mL/min (ref >90)
GFR calc Af Amer: >90 mL/min (ref >90)
GFR calc non Af Amer: >90 mL/min (ref >90)
GFR calc non Af Amer: >90 mL/min (ref >90)
GFR calc non Af Amer: >90 mL/min (ref >90)
GFR calc non Af Amer: >90 mL/min (ref >90)
GFR calc non Af Amer: >90 mL/min (ref >90)
Glucose, Bld: 131 mg/dL — ABNORMAL HIGH (ref 70–99)
Glucose, Bld: 194 mg/dL — ABNORMAL HIGH (ref 70–99)
Glucose, Bld: 175 mg/dL — ABNORMAL HIGH (ref 70–99)
Glucose, Bld: 98 mg/dL (ref 70–99)
Glucose, Bld: 253 mg/dL — ABNORMAL HIGH (ref 70–99)
Potassium: 3.5 mEq/L — ABNORMAL LOW (ref 3.7–5.3)
Potassium: 3.7 mEq/L (ref 3.7–5.3)
Potassium: 3.2 mEq/L — ABNORMAL LOW (ref 3.7–5.3)
Potassium: 3.2 mEq/L — ABNORMAL LOW (ref 3.7–5.3)
Potassium: 4.1 mEq/L (ref 3.7–5.3)
Sodium: 133 mEq/L — ABNORMAL LOW (ref 137–147)
Sodium: 134 mEq/L — ABNORMAL LOW (ref 137–147)
Sodium: 134 mEq/L — ABNORMAL LOW (ref 137–147)
Sodium: 136 mEq/L — ABNORMAL LOW (ref 137–147)
Sodium: 135 mEq/L — ABNORMAL LOW (ref 137–147)

## 2013-07-02 LAB — GLUTAMIC ACID DECARBOXYLASE AUTO ABS: Glutamic Acid Decarb Ab: <1 U/mL (ref <=1.0)

## 2013-07-02 LAB — C-PEPTIDE: C-Peptide: 0.51 ng/mL — ABNORMAL LOW (ref 0.80–3.90)

## 2013-07-02 MED ORDER — INSULIN ASPART 100 UNIT/ML ~~LOC~~ SOLN
3.0000 [IU] | Freq: Three times a day (TID) | SUBCUTANEOUS | Status: DC
  Administered 2013-07-02 – 2013-07-03 (×2): 3 [IU] via SUBCUTANEOUS

## 2013-07-02 MED ORDER — INSULIN NPH (HUMAN) (ISOPHANE) 100 UNIT/ML ~~LOC~~ SUSP
14.0000 [IU] | Freq: Two times a day (BID) | SUBCUTANEOUS | Status: DC
  Administered 2013-07-03: 14 [IU] via SUBCUTANEOUS
  Filled 2013-07-02: qty 10

## 2013-07-02 MED ORDER — SODIUM CHLORIDE 0.9 % IV SOLN
INTRAVENOUS | Status: DC
  Administered 2013-07-02: 18:00:00 via INTRAVENOUS

## 2013-07-02 MED ORDER — POTASSIUM CHLORIDE CRYS ER 20 MEQ PO TBCR
40.0000 meq | EXTENDED_RELEASE_TABLET | Freq: Once | ORAL | Status: AC
  Administered 2013-07-02: 40 meq via ORAL
  Filled 2013-07-02: qty 2

## 2013-07-02 MED ORDER — INSULIN NPH (HUMAN) (ISOPHANE) 100 UNIT/ML ~~LOC~~ SUSP
20.0000 [IU] | Freq: Once | SUBCUTANEOUS | Status: AC
  Administered 2013-07-02: 20 [IU] via SUBCUTANEOUS
  Filled 2013-07-02: qty 10

## 2013-07-02 MED ORDER — LISINOPRIL 2.5 MG PO TABS
2.5000 mg | ORAL_TABLET | Freq: Every day | ORAL | Status: DC
  Administered 2013-07-02 – 2013-07-04 (×2): 2.5 mg via ORAL
  Filled 2013-07-02 (×3): qty 1

## 2013-07-02 MED ORDER — INSULIN ASPART 100 UNIT/ML ~~LOC~~ SOLN
0.0000 [IU] | Freq: Three times a day (TID) | SUBCUTANEOUS | Status: DC
  Administered 2013-07-02 – 2013-07-03 (×2): 2 [IU] via SUBCUTANEOUS

## 2013-07-02 MED ORDER — ASPIRIN EC 81 MG PO TBEC
81.0000 mg | DELAYED_RELEASE_TABLET | Freq: Every day | ORAL | Status: DC
  Administered 2013-07-02 – 2013-07-04 (×3): 81 mg via ORAL
  Filled 2013-07-02 (×3): qty 1

## 2013-07-02 NOTE — Progress Notes (Addendum)
Noted pt still on IV insulin drip.  Glucose up after breakfast this am due to no carb coverage entered in GS program. Drip rate increased significantly due to elevated glucose following breakfast.   Instructed RN how to enter carb grams into GS program and to check next glucose one hour after entering the carbs and bolus IV per instruction.  2/4 at 1045: Acidosis is cleared at this time. Recommend basal Lantus 20 units now and in 2 hrs., d/c drip and start correction. Rec 3 units meal coverage tidwc as well (to start).    Insulin - Basal: Will need start with 20 units lantus/levemir 2 hrs before the insulin drip is discontinued. Correction (SSI): Will need to start the correction at the same time that the drip is d/c'd

## 2013-07-02 NOTE — Progress Notes (Signed)
Progress Note [place] TEAM 1 - Stepdown/ICU TEAM   Jeannetta NapKenneth A Wiechman WUJ:[id number] DOB: 06-15-1970 DOA: 06/30/2013 PCP: No PCP Per Patient  Brief narrative: 43 y.o. male with no significant past medical history who [facility] with complaints of polyuria and polydipsia. Patient had been experiencing these symptoms for a week.  Patient's blood sugar was found to be elevated and he was referred to the ER. In the ER patient's labs show elevated anion gap.  Subjective: No complaints today.  Is hungry and wants to eat.  Reports that multiple family members have DM.    Assessment/Plan:  Diabetic ketoacidosis in newly diagnosed DM - anion gap nearly normal at 13 - bicarb normal at 21 - will go ahead and transition off insulin gtt and follow CBG - need to establish adequate home dose for new insulin start - check BMET tonight to assure remains out of DKA - A1c noted to be markedly elevated at 13.2 - begin ASA - start ACE - DM education / connect w/ PCP / teach insulin injections and CBG monitoring - diet education   Nausea with vomiting - resolved - due to DKA   HLP - start Statin  Thrombocytopenia - recheck in AM   Dehydration - due to DKA - resolved - slow IVF  Code Status: FULL Family Communication: none Disposition Plan: SDU tonight - probable transfer in AM if remains off gtt  Consultants: none  Procedures: none  Antibiotics: none  DVT prophylaxis: Lovenox  Objective: Blood pressure 104/64, pulse 71, temperature 98 F (36.7 C), temperature source Oral, resp. rate 15, height 5\' 8"  (1.727 m), weight 83.2 kg (183 lb 6.8 oz), SpO2 99.00%.  Intake/Output Summary (Last 24 hours) at 07/02/13 1722 Last data filed at 07/02/13 1659  Gross per 24 hour  Intake 3381.3 ml  Output   1425 ml  Net 1956.3 ml   Exam: General: No acute respiratory distress Lungs: Clear to auscultation bilaterally without wheezes or crackles Cardiovascular: Regular rate and  rhythm without murmur gallop or rub normal S1 and S2 Abdomen: Nontender, nondistended, soft, bowel sounds positive, no rebound, no ascites, no appreciable mass Extremities: No significant cyanosis, clubbing, or edema bilateral lower extremities  Data Reviewed: Basic Metabolic Panel:  Recent Labs Lab 07/01/13 2145 07/02/13 0110 07/02/13 0607 07/02/13 0822 07/02/13 1324  NA 134* 133* 134* 134* 136*  K 3.4* 3.5* 3.7 3.2* 3.2*  CL 97 97 99 100 102  CO2 20 22 20 22 21   GLUCOSE 110* 131* 194* 175* 98  BUN 8 8 7 6 6   CREATININE 0.70 0.78 0.74 0.75 0.62  CALCIUM 8.8 8.8 8.7 8.5 8.7   Liver Function Tests:  Recent Labs Lab 06/30/13 1805 07/01/13 0230  AST 24 17  ALT 28 22  ALKPHOS 110 87  BILITOT 0.6 0.5  PROT 8.5* 6.8  ALBUMIN 4.5 3.6   CBC:  Recent Labs Lab 06/30/13 1702 06/30/13 1805 06/30/13 2356 07/01/13 0230  WBC  --  5.6 5.5 5.7  NEUTROABS  --  2.6  --  2.2  HGB 19.7* 17.2* 15.2 15.1  HCT 58.0* 51.5 46.8 44.7  MCV  --  80.5 80.8 80.3  PLT  --  150 132* 132*   Cardiac Enzymes:  Recent Labs Lab 06/30/13 2356  TROPONINI <0.30   CBG:  Recent Labs Lab 07/02/13 1243 07/02/13 1350 07/02/13 1445 07/02/13 1548 07/02/13 1654  GLUCAP 78 106* 132* 142* 157*    Recent Results (from the past  240 hour(s))  MRSA PCR SCREENING     Status: None   Collection Time    06/30/13 11:29 PM      Result Value Range Status   MRSA by PCR NEGATIVE  NEGATIVE Final   Comment:            The GeneXpert MRSA Assay (FDA     approved for NASAL specimens     only), is one component of a     comprehensive MRSA colonization     surveillance program. It is not     intended to diagnose MRSA     infection nor to guide or     monitor treatment for     MRSA infections.     Studies:  Recent x-ray studies have been reviewed in detail by the Attending Physician  Scheduled Meds:  Scheduled Meds: . aspirin EC  81 mg Oral Daily  . enoxaparin (LOVENOX) injection  40 mg  Subcutaneous Q24H  . insulin aspart  0-15 Units Subcutaneous TID WC  . insulin aspart  3 Units Subcutaneous TID WC  . [START ON 07/03/2013] insulin NPH Human  14 Units Subcutaneous BID AC & HS  . lisinopril  2.5 mg Oral Daily   Continuous Infusions: . sodium chloride      Time spent on care of this patient: 35 min   MCCLUNG,JEFFREY T, MD  [facility] Office  [phone] Pager - Text Page per Loretha Stapler as per below:  On-Call/Text Page:      [email or web address]      password [email or web address]  If 7PM-7AM, please contact night-coverage [email or web address] Password [email or web address] 07/02/2013, 5:22 PM   LOS: 2 days

## 2013-07-03 LAB — CBC
HCT: 41 % (ref 39.0–52.0)
Hemoglobin: 13.8 g/dL (ref 13.0–17.0)
MCH: 26.5 pg (ref 26.0–34.0)
MCHC: 33.7 g/dL (ref 30.0–36.0)
MCV: 78.8 fL (ref 78.0–100.0)
Platelets: 106 10*3/uL — ABNORMAL LOW (ref 150–400)
RBC: 5.2 MIL/uL (ref 4.22–5.81)
RDW: 13.2 % (ref 11.5–15.5)
WBC: 4.5 10*3/uL (ref 4.0–10.5)

## 2013-07-03 LAB — GLUCOSE, CAPILLARY
Glucose-Capillary: 148 mg/dL — ABNORMAL HIGH (ref 70–99)
Glucose-Capillary: 141 mg/dL — ABNORMAL HIGH (ref 70–99)
Glucose-Capillary: 223 mg/dL — ABNORMAL HIGH (ref 70–99)
Glucose-Capillary: 192 mg/dL — ABNORMAL HIGH (ref 70–99)

## 2013-07-03 LAB — BASIC METABOLIC PANEL
BUN: 5 mg/dL — ABNORMAL LOW (ref 6–23)
CO2: 23 mEq/L (ref 19–32)
Calcium: 8.4 mg/dL (ref 8.4–10.5)
Chloride: 102 mEq/L (ref 96–112)
Creatinine, Ser: 0.69 mg/dL (ref 0.50–1.35)
GFR calc Af Amer: >90 mL/min (ref >90)
GFR calc non Af Amer: >90 mL/min (ref >90)
Glucose, Bld: 168 mg/dL — ABNORMAL HIGH (ref 70–99)
Potassium: 3.7 mEq/L (ref 3.7–5.3)
Sodium: 135 mEq/L — ABNORMAL LOW (ref 137–147)

## 2013-07-03 MED ORDER — INSULIN GLARGINE 100 UNIT/ML ~~LOC~~ SOLN
40.0000 [IU] | Freq: Every day | SUBCUTANEOUS | Status: DC
  Filled 2013-07-03: qty 0.4

## 2013-07-03 MED ORDER — INSULIN PEN STARTER KIT
1.0000 | Freq: Once | Status: DC
  Filled 2013-07-03: qty 1

## 2013-07-03 MED ORDER — INSULIN ASPART 100 UNIT/ML ~~LOC~~ SOLN
5.0000 [IU] | Freq: Three times a day (TID) | SUBCUTANEOUS | Status: DC
  Administered 2013-07-03: 5 [IU] via SUBCUTANEOUS

## 2013-07-03 MED ORDER — INSULIN GLARGINE 100 UNIT/ML ~~LOC~~ SOLN
35.0000 [IU] | Freq: Every day | SUBCUTANEOUS | Status: DC
  Administered 2013-07-03: 35 [IU] via SUBCUTANEOUS
  Filled 2013-07-03 (×2): qty 0.35

## 2013-07-03 MED ORDER — ATORVASTATIN CALCIUM 20 MG PO TABS
20.0000 mg | ORAL_TABLET | Freq: Every day | ORAL | Status: DC
  Administered 2013-07-03: 20 mg via ORAL
  Filled 2013-07-03 (×2): qty 1

## 2013-07-03 MED ORDER — INJECTION DEVICE FOR INSULIN DEVI: Freq: Once | Status: DC

## 2013-07-03 MED ORDER — "BD GETTING STARTED TAKE HOME KIT: 1ML X 30 G SYRINGES, "
1.0000 | Freq: Once | Status: DC
  Filled 2013-07-03: qty 1

## 2013-07-03 MED ORDER — ATORVASTATIN CALCIUM 20 MG PO TABS
20.0000 mg | ORAL_TABLET | Freq: Every day | ORAL | Status: DC
  Administered 2013-07-03: 20 mg via ORAL
  Filled 2013-07-03: qty 1

## 2013-07-03 MED ORDER — BD GETTING STARTED TAKE HOME KIT: 3/10ML X 30G SYRINGES: 1.0000 | Freq: Once | Status: DC

## 2013-07-03 NOTE — Progress Notes (Signed)
Patient being transferred to 5 west per MD order, report called to RN. Patient Alert and oriented, VSS.

## 2013-07-03 NOTE — Progress Notes (Signed)
Progress Note [place] TEAM 1 - Stepdown/ICU TEAM   Joshua Escobar PJA:[id number] DOB: Dec 07, 1970 DOA: 06/30/2013 PCP: No PCP Per Patient  Brief narrative: 43 y.o. male with no significant past medical history who presented to UC at [facility] with complaints of polyuria and polydipsia. Patient had been experiencing these symptoms for a week.  Patient's blood sugar was found to be elevated and he was referred to the ER. In the ER patient's labs show elevated anion gap.  Subjective: No complaints today.    Assessment/Plan:  Diabetic ketoacidosis in newly diagnosed DM - anion gap nearly normal at 13 - bicarb normal at 21 - will go ahead and transition off insulin gtt and follow CBG - need to establish adequate home dose for new insulin start - Hba1c noted to be markedly elevated at 13.2 - begin ASA - start ACE - DM education / connect w/ PCP / teach insulin injections (has not started yet) and CBG monitoring - diet education -  - start Lantus- he states his insurance should be starting today and therefore should be able to afford it-  If it will be a few wks, pt can receiving assistance via match program one time   Nausea with vomiting - resolved - due to DKA   HLP - start Statin  Thrombocytopenia - plt dropping  Dehydration - due to DKA - resolved - slow IVF  Code Status: FULL Family Communication: none Disposition Plan: d/c home tomorrow  Consultants: none  Procedures: none  Antibiotics: none  DVT prophylaxis: Lovenox  Objective: Blood pressure 117/74, pulse 75, temperature 98.4 F (36.9 C), temperature source Oral, resp. rate 22, height '5\' 8"'  (1.727 m), weight 83.2 kg (183 lb 6.8 oz), SpO2 98.00%.  Intake/Output Summary (Last 24 hours) at 07/03/13 1924 Last data filed at 07/03/13 1600  Gross per 24 hour  Intake   1610 ml  Output   2400 ml  Net   -790 ml   Exam: General: No acute respiratory distress Lungs: Clear to auscultation bilaterally without wheezes  or crackles Cardiovascular: Regular rate and rhythm without murmur gallop or rub normal S1 and S2 Abdomen: Nontender, nondistended, soft, bowel sounds positive, no rebound, no ascites, no appreciable mass Extremities: No significant cyanosis, clubbing, or edema bilateral lower extremities  Data Reviewed: Basic Metabolic Panel:  Recent Labs Lab 07/02/13 0607 07/02/13 0822 07/02/13 1324 07/02/13 2035 07/03/13 0313  NA 134* 134* 136* 135* 135*  K 3.7 3.2* 3.2* 4.1 3.7  CL 99 100 102 101 102  CO2 '20 22 21 23 23  ' GLUCOSE 194* 175* 98 253* 168*  BUN '7 6 6 6 ' 5*  CREATININE 0.74 0.75 0.62 0.76 0.69  CALCIUM 8.7 8.5 8.7 8.4 8.4   Liver Function Tests:  Recent Labs Lab 06/30/13 1805 07/01/13 0230  AST 24 17  ALT 28 22  ALKPHOS 110 87  BILITOT 0.6 0.5  PROT 8.5* 6.8  ALBUMIN 4.5 3.6   CBC:  Recent Labs Lab 06/30/13 1702 06/30/13 1805 06/30/13 2356 07/01/13 0230 07/03/13 0313  WBC  --  5.6 5.5 5.7 4.5  NEUTROABS  --  2.6  --  2.2  --   HGB 19.7* 17.2* 15.2 15.1 13.8  HCT 58.0* 51.5 46.8 44.7 41.0  MCV  --  80.5 80.8 80.3 78.8  PLT  --  150 132* 132* 106*   Cardiac Enzymes:  Recent Labs Lab 06/30/13 2356  TROPONINI <0.30   CBG:  Recent Labs Lab 07/02/13 1814 07/02/13 2137  07/03/13 0721 07/03/13 1227 07/03/13 1627  GLUCAP 147* 247* 148* 141* 223*    Recent Results (from the past 240 hour(s))  MRSA PCR SCREENING     Status: None   Collection Time    06/30/13 11:29 PM      Result Value Range Status   MRSA by PCR NEGATIVE  NEGATIVE Final   Comment:            The GeneXpert MRSA Assay (FDA     approved for NASAL specimens     only), is one component of a     comprehensive MRSA colonization     surveillance program. It is not     intended to diagnose MRSA     infection nor to guide or     monitor treatment for     MRSA infections.     Studies:  Recent x-ray studies have been reviewed in detail by the Attending Physician  Scheduled  Meds:  Scheduled Meds: . aspirin EC  81 mg Oral Daily  . atorvastatin  20 mg Oral q1800  . bd getting started take home kit  1 kit Other Once  . insulin aspart  5 Units Subcutaneous TID WC  . insulin glargine  35 Units Subcutaneous QHS  . Insulin Pen Starter Kit  1 kit Other Once  . lisinopril  2.5 mg Oral Daily   Continuous Infusions:    Time spent on care of this patient: 65 min   Debbe Odea, MD  [facility] Office  [phone] Pager - Text Page per Shea Evans as per below:  On-Call/Text Page:      [email or web address]      password [email or web address]  If 7PM-7AM, please contact night-coverage [email or web address] Password [email or web address] 07/03/2013, 7:24 PM   LOS: 3 days

## 2013-07-03 NOTE — Progress Notes (Signed)
Patient trasfered from 3S to [phone] via wheelchair; alert and oriented x 4; no complaints of pain; IV saline locked in LFA ; skin intact; room air. Orient patient to room and unit;  instructed how to use the call bell and  fall risk precautions. Will continue to monitor the patient.

## 2013-07-03 NOTE — Progress Notes (Addendum)
Inpatient Diabetes Program Recommendations  AACE/ADA: New Consensus Statement on Inpatient Glycemic Control (2013)  Target Ranges:  Prepandial:   less than 140 mg/dL      Peak postprandial:   less than 180 mg/dL (1-2 hours)      Critically ill patients:  140 - 180 mg/dL   Inpatient Diabetes Program Recommendations Insulin - Basal: xxxxxxx Correction (SSI): xxxxxxxxxxxxx  Noted pt is medicaid pending. NPH ordered at 14 units in am and at HS. Meal coverage at 3 units tidwc. I would recommend using Novolin 70/30 at 20 units am and 20 units ac super. This dosage calculates to 13 units NPH twice a day and 7 units Regular twice a day which should be a good starting dose. Pre-mix is easier to use and with log of cbg's as OP, his doses can be titrated. Noted GAD antibody was negative, indicating most probable type 2, insulin requiring at this time.  With exercise and diet changes, pt could well in time be managed without insulin. Will talk with patient and relate this information. Have ordered insulin starter kit and RN's to instruct pt how to draw up and inject insulin. Pt can use the ReliOn glucose meter as well as the ReliOn 70/30 insulin at discharge. Hopeful that care management will refer pt to the [facility] and make his first OP appt.Thank you, Rosita Kea, RN, CNS, Diabetes Coordinator [phone]) OP education ordered, but pt will need the start at the clinic and if medicaid approved, he will be able to get the OP ed.  Ad Pt is not medicaid pending as documented earlier. He is to get insurance once he goes back to work as he has just talked with his boss. Pt will need the generic ReliOn insulin and glucometer until he is able to get his insurance. Ad: 8676: [facility] with Dr Wynelle Cleveland regarding insulin regimen at discharge. Pt can use Match program to get novolog and lantus and then use his insurance for refills. Dr Wynelle Cleveland has already spoken with Care management. Pt watching videos on  insulin administration and checking cbg's. Will introduce pt to insulin pen id that is an option for him to use.

## 2013-07-03 NOTE — Plan of Care (Signed)
Problem: Food- and Nutrition-Related Knowledge Deficit (NB-1.1) Goal: Nutrition education Formal process to instruct or train a patient/client in a skill or to impart knowledge to help patients/clients voluntarily manage or modify food choices and eating behavior to maintain or improve health.  Outcome: Completed/Met Date Met:  07/03/13  Nutrition Education Note  RD consulted for nutrition education regarding diabetes. Please note that dietetic intern spoke with patient on 2/3 re: DM. RD reviewed information again with patient. Pt is currently without further questions at this time.    Lab Results  Component Value Date    HGBA1C 13.2* 06/30/2013    RD provided "Balanced Plate" handout to patient. Discussed different food groups and their effects on blood sugar, emphasizing carbohydrate-containing foods. Provided list of carbohydrates and recommended serving sizes of common foods.  Discussed importance of controlled and consistent carbohydrate intake throughout the day. Teach back method used.  Expect fair compliance.  Body mass index is 27.9 kg/(m^2). Pt meets criteria for Overweight based on current BMI.  Current diet order is Carbohydrate Modified Medium, patient is consuming approximately 100% of meals at this time. Labs and medications reviewed. No further nutrition interventions warranted at this time. RD contact information provided. If additional nutrition issues arise, please re-consult RD.  Inda Coke MS, RD, LDN Pager: [phone] After-hours pager: [phone]

## 2013-07-04 DIAGNOSIS — Z794 Long term (current) use of insulin: Secondary | ICD-10-CM

## 2013-07-04 DIAGNOSIS — E1165 Type 2 diabetes mellitus with hyperglycemia: Secondary | ICD-10-CM

## 2013-07-04 DIAGNOSIS — IMO0001 Reserved for inherently not codable concepts without codable children: Secondary | ICD-10-CM | POA: Diagnosis present

## 2013-07-04 LAB — BASIC METABOLIC PANEL
BUN: 8 mg/dL (ref 6–23)
CO2: 27 mEq/L (ref 19–32)
Calcium: 8.8 mg/dL (ref 8.4–10.5)
Chloride: 101 mEq/L (ref 96–112)
Creatinine, Ser: 0.78 mg/dL (ref 0.50–1.35)
GFR calc Af Amer: >90 mL/min (ref >90)
GFR calc non Af Amer: >90 mL/min (ref >90)
Glucose, Bld: 149 mg/dL — ABNORMAL HIGH (ref 70–99)
Potassium: 3.6 mEq/L — ABNORMAL LOW (ref 3.7–5.3)
Sodium: 139 mEq/L (ref 137–147)

## 2013-07-04 LAB — GLUCOSE, CAPILLARY
Glucose-Capillary: 118 mg/dL — ABNORMAL HIGH (ref 70–99)
Glucose-Capillary: 110 mg/dL — ABNORMAL HIGH (ref 70–99)

## 2013-07-04 LAB — CBC
HCT: 41.9 % (ref 39.0–52.0)
Hemoglobin: 13.9 g/dL (ref 13.0–17.0)
MCH: 26.4 pg (ref 26.0–34.0)
MCHC: 33.2 g/dL (ref 30.0–36.0)
MCV: 79.7 fL (ref 78.0–100.0)
Platelets: 117 10*3/uL — ABNORMAL LOW (ref 150–400)
RBC: 5.26 MIL/uL (ref 4.22–5.81)
RDW: 13.3 % (ref 11.5–15.5)
WBC: 4.9 10*3/uL (ref 4.0–10.5)

## 2013-07-04 MED ORDER — INSULIN GLARGINE 100 UNIT/ML SOLOSTAR PEN: 35.0000 [IU] | PEN_INJECTOR | Freq: Every day | SUBCUTANEOUS | Status: DC

## 2013-07-04 MED ORDER — INSULIN ASPART 100 UNIT/ML ~~LOC~~ SOLN
5.0000 [IU] | Freq: Three times a day (TID) | SUBCUTANEOUS | Status: DC
  Administered 2013-07-04 (×2): 5 [IU] via SUBCUTANEOUS

## 2013-07-04 MED ORDER — LISINOPRIL 2.5 MG PO TABS: 2.5000 mg | ORAL_TABLET | Freq: Every day | ORAL | Status: DC

## 2013-07-04 MED ORDER — ATORVASTATIN CALCIUM 20 MG PO TABS: 20.0000 mg | ORAL_TABLET | Freq: Every day | ORAL | Status: DC

## 2013-07-04 MED ORDER — ASPIRIN 81 MG PO TBEC: 81.0000 mg | DELAYED_RELEASE_TABLET | Freq: Every day | ORAL | Status: DC

## 2013-07-04 MED ORDER — INSULIN ASPART 100 UNIT/ML FLEXPEN: 5.0000 [IU] | PEN_INJECTOR | Freq: Three times a day (TID) | SUBCUTANEOUS | Status: DC

## 2013-07-04 NOTE — Discharge Instructions (Signed)
To obtain a Primary Care Physician you can call the toll free number on your insurance card and request a providers list for your area. You may also visit the insurance provider online. Or you can contact Health Connect.   Health Connect [phone] or Physician Referral service [phone] choose option #2 or 1 [phone]   Type 2 Diabetes Mellitus, Adult  Type 2 diabetes mellitus, often simply referred to as type 2 diabetes, is a long-lasting (chronic) disease. In type 2 diabetes, the pancreas does not make enough insulin (a hormone), the cells are less responsive to the insulin that is made (insulin resistance), or both. Normally, insulin moves sugars from food into the tissue cells. The tissue cells use the sugars for energy. The lack of insulin or the lack of normal response to insulin causes excess sugars to build up in the blood instead of going into the tissue cells. As a result, high blood sugar (hyperglycemia) develops. The effect of high sugar (glucose) levels can cause many complications. Type 2 diabetes was also previously called adult-onset diabetes but it can occur at any age.  RISK FACTORS  A person is predisposed to developing type 2 diabetes if someone in the family has the disease and also has one or more of the following primary risk factors:  Overweight.  An inactive lifestyle.  A history of consistently eating high-calorie foods. Maintaining a normal weight and regular physical activity can reduce the chance of developing type 2 diabetes. SYMPTOMS  A person with type 2 diabetes may not show symptoms initially. The symptoms of type 2 diabetes appear slowly. The symptoms include:  Increased thirst (polydipsia).  Increased urination (polyuria).  Increased urination during the night (nocturia).  Weight loss. This weight loss may be rapid.  Frequent, recurring infections.  Tiredness (fatigue).  Weakness.  Vision changes, such as blurred vision.  Fruity  smell to your breath.  Abdominal pain.  Nausea or vomiting.  Cuts or bruises which are slow to heal.  Tingling or numbness in the hands or feet. DIAGNOSIS Type 2 diabetes is frequently not diagnosed until complications of diabetes are present. Type 2 diabetes is diagnosed when symptoms or complications are present and when blood glucose levels are increased. Your blood glucose level may be checked by one or more of the following blood tests:  A fasting blood glucose test. You will not be allowed to eat for at least 8 hours before a blood sample is taken.  A random blood glucose test. Your blood glucose is checked at any time of the day regardless of when you ate.  A hemoglobin A1c blood glucose test. A hemoglobin A1c test provides information about blood glucose control over the previous 3 months.  An oral glucose tolerance test (OGTT). Your blood glucose is measured after you have not eaten (fasted) for 2 hours and then after you drink a glucose-containing beverage. TREATMENT   You may need to take insulin or diabetes medicine daily to keep blood glucose levels in the desired range.  You will need to match insulin dosing with exercise and healthy food choices. The treatment goal is to maintain the before meal blood sugar (preprandial glucose) level at 70 130 mg/dL. HOME CARE INSTRUCTIONS   Have your hemoglobin A1c level checked twice a year.  Perform daily blood glucose monitoring as directed by your caregiver.  Monitor urine ketones when you are ill and as directed by your caregiver.  Take your diabetes medicine or insulin as directed  by your caregiver to maintain your blood glucose levels in the desired range.  Never run out of diabetes medicine or insulin. It is needed every day.  Adjust insulin based on your intake of carbohydrates. Carbohydrates can raise blood glucose levels but need to be included in your diet. Carbohydrates provide vitamins, minerals, and fiber which are  an essential part of a healthy diet. Carbohydrates are found in fruits, vegetables, whole grains, dairy products, legumes, and foods containing added sugars.    Eat healthy foods. Alternate 3 meals with 3 snacks.  Lose weight if overweight.  Carry a medical alert card or wear your medical alert jewelry.  Carry a 15 gram carbohydrate snack with you at all times to treat low blood glucose (hypoglycemia). Some examples of 15 gram carbohydrate snacks include:  Glucose tablets, 3 or 4   Glucose gel, 15 gram tube  Raisins, 2 tablespoons (24 grams)  Jelly beans, 6  Animal crackers, 8  Regular pop, 4 ounces (120 mL)  Gummy treats, 9  Recognize hypoglycemia. Hypoglycemia occurs with blood glucose levels of 70 mg/dL and below. The risk for hypoglycemia increases when fasting or skipping meals, during or after intense exercise, and during sleep. Hypoglycemia symptoms can include:  Tremors or shakes.  Decreased ability to concentrate.  Sweating.  Increased heart rate.  Headache.  Dry mouth.  Hunger.  Irritability.  Anxiety.  Restless sleep.  Altered speech or coordination.  Confusion.  Treat hypoglycemia promptly. If you are alert and able to safely swallow, follow the 15:15 rule:  Take 15 20 grams of rapid-acting glucose or carbohydrate. Rapid-acting options include glucose gel, glucose tablets, or 4 ounces (120 mL) of fruit juice, regular soda, or low fat milk.  Check your blood glucose level 15 minutes after taking the glucose.  Take 15 20 grams more of glucose if the repeat blood glucose level is still 70 mg/dL or below.  Eat a meal or snack within 1 hour once blood glucose levels return to normal.    Be alert to polyuria and polydipsia which are early signs of hyperglycemia. An early awareness of hyperglycemia allows for prompt treatment. Treat hyperglycemia as directed by your caregiver.  Engage in at least 150 minutes of moderate-intensity physical  activity a week, spread over at least 3 days of the week or as directed by your caregiver. In addition, you should engage in resistance exercise at least 2 times a week or as directed by your caregiver.  Adjust your medicine and food intake as needed if you start a new exercise or sport.  Follow your sick day plan at any time you are unable to eat or drink as usual.  Avoid tobacco use.  Limit alcohol intake to no more than 1 drink per day for nonpregnant women and 2 drinks per day for men. You should drink alcohol only when you are also eating food. Talk with your caregiver whether alcohol is safe for you. Tell your caregiver if you drink alcohol several times a week.  Follow up with your caregiver regularly.  Schedule an eye exam soon after the diagnosis of type 2 diabetes and then annually.  Perform daily skin and foot care. Examine your skin and feet daily for cuts, bruises, redness, nail problems, bleeding, blisters, or sores. A foot exam by a caregiver should be done annually.  Brush your teeth and gums at least twice a day and floss at least once a day. Follow up with your dentist regularly.  Share your diabetes  management plan with your workplace or school.  Stay up-to-date with immunizations.  Learn to manage stress.  Obtain ongoing diabetes education and support as needed.  Participate in, or seek rehabilitation as needed to maintain or improve independence and quality of life. Request a physical or occupational therapy referral if you are having foot or hand numbness or difficulties with grooming, dressing, eating, or physical activity. SEEK MEDICAL CARE IF:   You are unable to eat food or drink fluids for more than 6 hours.  You have nausea and vomiting for more than 6 hours.  Your blood glucose level is over 240 mg/dL.  There is a change in mental status.  You develop an additional serious illness.  You have diarrhea for more than 6 hours.  You have been sick or  have had a fever for a couple of days and are not getting better.  You have pain during any physical activity.  SEEK IMMEDIATE MEDICAL CARE IF:  You have difficulty breathing.  You have moderate to large ketone levels. MAKE SURE YOU:  Understand these instructions.  Will watch your condition.  Will get help right away if you are not doing well or get worse. Document Released: 05/15/2005 Document Revised: 02/07/2012 Document Reviewed: 12/12/2011 [facility] Patient Information 2014 [place], [place].

## 2013-07-04 NOTE — Progress Notes (Signed)
Joshua Escobar to be D/C'd Home per MD order.  Discussed with the patient and all questions fully answered.    Medication List         aspirin 81 MG EC tablet  Take 1 tablet (81 mg total) by mouth daily.     atorvastatin 20 MG tablet  Commonly known as:  LIPITOR  Take 1 tablet (20 mg total) by mouth daily at 6 PM.     insulin aspart 100 UNIT/ML FlexPen  Commonly known as:  NOVOLOG FLEXPEN  Inject 5 Units into the skin 3 (three) times daily with meals.     Insulin Glargine 100 UNIT/ML Solostar Pen  Commonly known as:  LANTUS SOLOSTAR  Inject 35 Units into the skin daily at 10 pm.     lisinopril 2.5 MG tablet  Commonly known as:  PRINIVIL,ZESTRIL  Take 1 tablet (2.5 mg total) by mouth daily.        VVS, Skin clean, dry and intact without evidence of skin break down, no evidence of skin tears noted. IV catheter discontinued intact. Site without signs and symptoms of complications. Dressing and pressure applied.  An After Visit Summary was printed and given to the patient.  D/c education completed with patient/family including follow up instructions, medication list, d/c activities limitations if indicated, with other d/c instructions as indicated by MD - patient able to verbalize understanding, all questions fully answered.   Patient instructed to return to ED, call 911, or call MD for any changes in condition.   Patient escorted via WC, and D/C home via private auto.  Aldean Ast 07/04/2013 3:33 PM

## 2013-07-04 NOTE — Progress Notes (Signed)
Educated patient on insulin pen use at home. Reviewed all steps of insulin pen including attachment of needle, 2-unit air shot, dialing up dose, giving injection, removing needle, disposal of sharps, storage of unused insulin, disposal of insulin etc. Patient able to provide successful return demonstration. Patient reports that he has watched all diabetes videos as requested to do.  However, video #511 (Insulin Pen) was not on his list so I have asked patient to be sure he watches the video before leaving. Reviewed signs and symptoms of hyperglycemia and hypoglycemia along with treatment for both. Talked with Gavin Pound, RN, CM and patient will be given a MATCH voucher for insulin pens but they are not able to give [facility] voucher for insulin pen needles.  Provided patient with 6 sample pack of insulin needles to bridge the gap between now and the time he is able to get his insurance initiated at his work place.   Thanks, Orlando Penner, RN, MSN, CCRN Diabetes Coordinator [facility] [phone] (Team Pager) [phone] ([facility] office) [phone] [facility] office)

## 2013-07-04 NOTE — Discharge Summary (Signed)
DISCHARGE SUMMARY  Joshua Escobar  MR#: [id number]  DOB:Jun 18, 1970  Date of Admission: 06/30/2013 Date of Discharge: 07/04/2013  Attending Physician:MCCLUNG,JEFFREY T  Patient's PCP: No PCP - Pt has insurance and has requested that he be allowed to find his own PCP and make his own appointment.    Consults: none  Disposition: D/C home   Follow-up Appts:     Follow-up Information   Follow up with Obtain a Primary Care MD as soon as possible . Schedule an appointment as soon as possible for a visit in 3 days.     Tests Needing Follow-up: -) check CBG log -) check BMET to assure ACE is being tolerated - check CBC to recheck plt count  -) check LFTs to assure statin is being tolerated  Discharge Diagnoses: Diabetic ketoacidosis in newly diagnosed DM Nausea with vomiting  HLP  Thrombocytopenia  Dehydration   Initial presentation: 43 y.o. male with no significant past medical history who presented to UC at [facility] with complaints of polyuria and polydipsia. Patient had been experiencing these symptoms for a week. Patient's blood sugar was found to be elevated and he was referred to the ER. In the ER patient's labs revealed an elevated anion gap.  Hospital Course:  Diabetic ketoacidosis in newly diagnosed DM  - pt rapidly corrected w/ insulin gtt - transitioned to long acting insulin w/ meal doses of short acting insulin - Hba1c noted to be markedly elevated at 13.2 - began ASA - started ACE - DM education provided / attemtpted to connect w/ PCP but pt has insurance and requested that he be allowed to find his own PCP and make his own appointment / taught insulin injections and CBG monitoring (pt was doing both on his on before d/c - diet education completed - started Lantus- he stated his insurance should be starting now and therefore should be able to afford it - If it will be a few wks, pt can receive assistance via the match program one time   Nausea with vomiting  -  resolved - due to DKA   HLP  - started Statin   Thrombocytopenia  - plt stable at 117 at time of d/c   Dehydration  - due to DKA - resolved     Medication List         aspirin 81 MG EC tablet  Take 1 tablet (81 mg total) by mouth daily.     atorvastatin 20 MG tablet  Commonly known as:  LIPITOR  Take 1 tablet (20 mg total) by mouth daily at 6 PM.     insulin aspart 100 UNIT/ML FlexPen  Commonly known as:  NOVOLOG FLEXPEN  Inject 5 Units into the skin 3 (three) times daily with meals.     Insulin Glargine 100 UNIT/ML Solostar Pen  Commonly known as:  LANTUS SOLOSTAR  Inject 35 Units into the skin daily at 10 pm.     lisinopril 2.5 MG tablet  Commonly known as:  PRINIVIL,ZESTRIL  Take 1 tablet (2.5 mg total) by mouth daily.       Day of Discharge BP 114/71  Pulse 72  Temp(Src) 98.2 F (36.8 C) (Oral)  Resp 20  Ht 5\' 8"  (1.727 m)  Wt 83.2 kg (183 lb 6.8 oz)  BMI 27.90 kg/m2  SpO2 97%  Physical Exam: General: No acute respiratory distress Lungs: Clear to auscultation bilaterally without wheezes or crackles Cardiovascular: Regular rate and rhythm without murmur gallop or rub normal S1 and  S2 Abdomen: Nontender, nondistended, soft, bowel sounds positive, no rebound, no ascites, no appreciable mass Extremities: No significant cyanosis, clubbing, or edema bilateral lower extremities  CBC     Status: Abnormal   Collection Time    07/04/13  5:40 AM      Result Value Range   WBC 4.9  4.0 - 10.5 K/uL   RBC 5.26  4.22 - 5.81 MIL/uL   Hemoglobin 13.9  13.0 - 17.0 g/dL   HCT 40.941.9  81.139.0 - 91.452.0 %   MCV 79.7  78.0 - 100.0 fL   MCH 26.4  26.0 - 34.0 pg   MCHC 33.2  30.0 - 36.0 g/dL   RDW 78.213.3  95.611.5 - 21.315.5 %   Platelets 117 (*) 150 - 400 K/uL  BASIC METABOLIC PANEL     Status: Abnormal   Collection Time    07/04/13  5:40 AM      Result Value Range   Sodium 139  137 - 147 mEq/L   Potassium 3.6 (*) 3.7 - 5.3 mEq/L   Chloride 101  96 - 112 mEq/L   CO2 27  19 - 32  mEq/L   Glucose, Bld 149 (*) 70 - 99 mg/dL   BUN 8  6 - 23 mg/dL   Creatinine, Ser 0.860.78  0.50 - 1.35 mg/dL   Calcium 8.8  8.4 - 57.810.5 mg/dL   GFR calc non Af Amer >90  >90 mL/min   GFR calc Af Amer >90  >90 mL/min  GLUCOSE, CAPILLARY     Status: Abnormal   Collection Time    07/04/13  8:31 AM      Result Value Range   Glucose-Capillary 118 (*) 70 - 99 mg/dL  GLUCOSE, CAPILLARY     Status: Abnormal   Collection Time      Result Value Range   Glucose-Capillary 110 (*) 70 - 99 mg/dL   Time spent in discharge (includes decision making & examination of pt): > 30 minutes  07/04/2013, 2:46 PM   Lonia BloodJeffrey T. McClung, MD [facility] Office  [phone] Pager [phone]  On-Call/Text Page:      [email or web address]      password [email or web address]

## 2013-07-28 ENCOUNTER — Telehealth: Payer: Self-pay

## 2013-08-14 ENCOUNTER — Ambulatory Visit: Payer: Self-pay | Admitting: *Deleted

## 2013-09-10 ENCOUNTER — Ambulatory Visit: Payer: Self-pay | Admitting: *Deleted

## 2015-05-04 ENCOUNTER — Encounter (HOSPITAL_BASED_OUTPATIENT_CLINIC_OR_DEPARTMENT_OTHER): Payer: Self-pay | Admitting: *Deleted

## 2015-05-04 ENCOUNTER — Emergency Department (HOSPITAL_BASED_OUTPATIENT_CLINIC_OR_DEPARTMENT_OTHER)
Admission: EM | Admit: 2015-05-04 | Discharge: 2015-05-04 | Disposition: A | Discharge status: Home / Self Care | Payer: BLUE CROSS/BLUE SHIELD | Attending: Emergency Medicine | Admitting: Emergency Medicine

## 2015-05-04 DIAGNOSIS — Z794 Long term (current) use of insulin: Secondary | ICD-10-CM | POA: Diagnosis not present

## 2015-05-04 DIAGNOSIS — L03011 Cellulitis of right finger: Secondary | ICD-10-CM | POA: Diagnosis not present

## 2015-05-04 DIAGNOSIS — Z7982 Long term (current) use of aspirin: Secondary | ICD-10-CM | POA: Diagnosis not present

## 2015-05-04 DIAGNOSIS — M79641 Pain in right hand: Secondary | ICD-10-CM | POA: Diagnosis present

## 2015-05-04 DIAGNOSIS — F17210 Nicotine dependence, cigarettes, uncomplicated: Secondary | ICD-10-CM | POA: Insufficient documentation

## 2015-05-04 DIAGNOSIS — E119 Type 2 diabetes mellitus without complications: Secondary | ICD-10-CM | POA: Diagnosis not present

## 2015-05-04 DIAGNOSIS — Z79899 Other long term (current) drug therapy: Secondary | ICD-10-CM | POA: Insufficient documentation

## 2015-05-04 MED ORDER — LIDOCAINE HCL (PF) 2 % IJ SOLN: 10.0000 mL | Freq: Once | INTRAMUSCULAR | Status: DC

## 2015-05-04 MED ORDER — CEPHALEXIN 500 MG PO CAPS: 500.0000 mg | ORAL_CAPSULE | Freq: Four times a day (QID) | ORAL | Status: DC

## 2015-05-04 MED ORDER — LIDOCAINE HCL (PF) 2 % IJ SOLN
INTRAMUSCULAR | Status: AC
  Filled 2015-05-04: qty 2

## 2015-05-04 NOTE — ED Provider Notes (Addendum)
CSN: [id number]     Arrival date & time 05/04/15  1219 History   First MD Initiated Contact with Patient 05/04/15 1242     Chief Complaint  Patient presents with  . Hand Pain     (Consider location/radiation/quality/duration/timing/severity/associated sxs/prior Treatment) HPI Comments: Patient is a 44 year old male with history of insulin-dependent diabetes. He presents with pain and swelling to the tip of his right finger. He denies any injury or trauma. He denies any fevers or chills.  Patient is a 44 y.o. male presenting with hand pain. The history is provided by the patient.  Hand Pain This is a new problem. The current episode started 2 days ago. The problem occurs constantly. The problem has been gradually worsening. Nothing aggravates the symptoms. Nothing relieves the symptoms.    Past Medical History  Diagnosis Date  . Medical history non-contributory    Past Surgical History  Procedure Laterality Date  . No past surgeries     Family History  Problem Relation Age of Onset  . Diabetes Mellitus II Sister   . Diabetes Mellitus II Brother    Social History  Substance Use Topics  . Smoking status: Current Every Day Smoker -- 0.50 packs/day    Types: Cigarettes  . Smokeless tobacco: None  . Alcohol Use: Yes     Comment: ocaasionally    Review of Systems  All other systems reviewed and are negative.     Allergies  Review of patient's allergies indicates no known allergies.  Home Medications   Prior to Admission medications   Medication Sig Start Date End Date Taking? Authorizing Provider  aspirin EC 81 MG EC tablet Take 1 tablet (81 mg total) by mouth daily. 07/04/13   Lonia Blood, MD  atorvastatin (LIPITOR) 20 MG tablet Take 1 tablet (20 mg total) by mouth daily at 6 PM. 07/04/13   Lonia Blood, MD  insulin aspart (NOVOLOG FLEXPEN) 100 UNIT/ML FlexPen Inject 5 Units into the skin 3 (three) times daily with meals. 07/04/13   Lonia Blood, MD  Insulin  Glargine (LANTUS SOLOSTAR) 100 UNIT/ML Solostar Pen Inject 35 Units into the skin daily at 10 pm. 07/04/13   Lonia Blood, MD  lisinopril (PRINIVIL,ZESTRIL) 2.5 MG tablet Take 1 tablet (2.5 mg total) by mouth daily. 07/04/13   Lonia Blood, MD   BP 134/91 mmHg  Pulse 100  Temp(Src) 98.4 F (36.9 C) (Oral)  Resp 20  Ht 5' 8.5" (1.74 m)  Wt 183 lb (83.008 kg)  BMI 27.42 kg/m2  SpO2 100% Physical Exam  Constitutional: He is oriented to person, place, and time. He appears well-developed and well-nourished.  HENT:  Head: Normocephalic and atraumatic.  Neck: Normal range of motion. Neck supple.  Neurological: He is alert and oriented to person, place, and time.  Skin: Skin is warm and dry.  There is swelling and tenderness adjacent to the nail of the right fifth finger.  Nursing note and vitals reviewed.   ED Course  Procedures (including critical care time) Labs Review Labs Reviewed - No data to display  Imaging Review No results found. I have personally reviewed and evaluated these images and lab results as part of my medical decision-making.   EKG Interpretation None     INCISION AND DRAINAGE Performed by: Geoffery Lyons Consent: Verbal consent obtained. Risks and benefits: risks, benefits and alternatives were discussed Type: abscess  Body area: Finger  Anesthesia: local infiltration  Incision was made with a scalpel.  Local  anesthetic: lidocaine 2% without epinephrine  Anesthetic total: 4 ml  Complexity: complex Blunt dissection to break up loculations  Drainage: purulent  Drainage amount: Moderate   Packing material: None   Patient tolerance: Patient tolerated the procedure well with no immediate complications.    MDM   Final diagnoses:  None    The swelling represents a paronychia. This was treated with incision and drainage after a digital block was performed using 2% lidocaine. A significant quantity of purulent material was expressed. A  she will be treated with Keflex, warm soaks, dressings, and when necessary return.    Geoffery Lyons, MD 05/04/15 1302  Geoffery Lyons, MD 05/14/15 [id number]

## 2015-05-04 NOTE — ED Notes (Signed)
Swelling, pain and yellow pus under the nailbed of his right 5th digit.

## 2015-05-04 NOTE — Discharge Instructions (Signed)
Keflex as prescribed.  Perform warm soaks as frequently as possible for the next 2 days. When not soaking the area, apply bacitracin and a Band-Aid.  Return to the ER symptoms significant we worsen or change.   Paronychia Paronychia is an infection of the skin that surrounds a nail. It usually affects the skin around a fingernail, but it may also occur near a toenail. It often causes pain and swelling around the nail. This condition may come on suddenly or develop over a longer period. In some cases, a collection of pus (abscess) can form near or under the nail. Usually, paronychia is not serious and it clears up with treatment. CAUSES This condition may be caused by bacteria or fungi. It is commonly caused by either Streptococcus or Staphylococcus bacteria. The bacteria or fungi often cause the infection by getting into the affected area through an opening in the skin, such as a cut or a hangnail. RISK FACTORS This condition is more likely to develop in:  People who get their hands wet often, such as those who work as Fish farm manager, bartenders, or nurses.  People who bite their fingernails or suck their thumbs.  People who trim their nails too short.  People who have hangnails or injured fingertips.  People who get manicures.  People who have diabetes. SYMPTOMS Symptoms of this condition include:  Redness and swelling of the skin near the nail.  Tenderness around the nail when you touch the area.  Pus-filled bumps under the cuticle. The cuticle is the skin at the base or sides of the nail.  Fluid or pus under the nail.  Throbbing pain in the area. DIAGNOSIS This condition is usually diagnosed with a physical exam. In some cases, a sample of pus may be taken from an abscess to be tested in a lab. This can help to determine what type of bacteria or fungi is causing the condition. TREATMENT Treatment for this condition depends on the cause and severity of the condition. If the  condition is mild, it may clear up on its own in a few days. Your health care provider may recommend soaking the affected area in warm water a few times a day. When treatment is needed, the options may include:  Antibiotic medicine, if the condition is caused by a bacterial infection.  Antifungal medicine, if the condition is caused by a fungal infection.  Incision and drainage, if an abscess is present. In this procedure, the health care provider will cut open the abscess so the pus can drain out. HOME CARE INSTRUCTIONS  Soak the affected area in warm water if directed to do so by your health care provider. You may be told to do this for 20 minutes, 2-3 times a day. Keep the area dry in between soakings.  Take medicines only as directed by your health care provider.  If you were prescribed an antibiotic medicine, finish all of it even if you start to feel better.  Keep the affected area clean.  Do not try to drain a fluid-filled bump yourself.  If you will be washing dishes or performing other tasks that require your hands to get wet, wear rubber gloves. You should also wear gloves if your hands might come in contact with irritating substances, such as cleaners or chemicals.  Follow your health care provider's instructions about:  Wound care.  Bandage (dressing) changes and removal. SEEK MEDICAL CARE IF:  Your symptoms get worse or do not improve with treatment.  You have a  fever or chills.  You have redness spreading from the affected area.  You have continued or increased fluid, blood, or pus coming from the affected area.  Your finger or knuckle becomes swollen or is difficult to move.   This information is not intended to replace advice given to you by your health care provider. Make sure you discuss any questions you have with your health care provider.   Document Released: 11/08/2000 Document Revised: 09/29/2014 Document Reviewed: 04/22/2014 [employer] Interactive  Patient Education [employer]

## 2015-11-02 ENCOUNTER — Ambulatory Visit (HOSPITAL_COMMUNITY)
Admission: EM | Admit: 2015-11-02 | Discharge: 2015-11-02 | Disposition: A | Discharge status: Home / Self Care | Payer: BLUE CROSS/BLUE SHIELD | Attending: Emergency Medicine | Admitting: Emergency Medicine

## 2015-11-02 ENCOUNTER — Encounter (HOSPITAL_COMMUNITY): Payer: Self-pay | Admitting: Emergency Medicine

## 2015-11-02 DIAGNOSIS — R51 Headache: Secondary | ICD-10-CM | POA: Diagnosis not present

## 2015-11-02 DIAGNOSIS — R519 Headache, unspecified: Secondary | ICD-10-CM

## 2015-11-02 LAB — GLUCOSE, CAPILLARY: Glucose-Capillary: 305 mg/dL — ABNORMAL HIGH (ref 65–99)

## 2015-11-02 MED ORDER — TETRACAINE HCL 0.5 % OP SOLN
OPHTHALMIC | Status: AC
  Filled 2015-11-02: qty 2

## 2015-11-02 MED ORDER — NORTRIPTYLINE HCL 25 MG PO CAPS: 25.0000 mg | ORAL_CAPSULE | Freq: Every day | ORAL | Status: DC

## 2015-11-02 NOTE — ED Notes (Signed)
Pt was having left facial pain over the weekend.  Yesterday he started having the pain in the right side of his face.  He was also having some dizziness.  He denies any chest pain, numbness or tingling in his extremities orvision changes.

## 2015-11-02 NOTE — ED Provider Notes (Signed)
CSN: [id number]     Arrival date & time 11/02/15  1836 History   First MD Initiated Contact with Patient 11/02/15 1912     Chief Complaint  Patient presents with  . Facial Pain  . Dizziness   (Consider location/radiation/quality/duration/timing/severity/associated sxs/prior Treatment) HPI  He is a 45 year old man here for evaluation of facial pain. He states this started over the weekend with pain and sensitivity throughout the left face. Yesterday, it switched and is now just the right side of his face. He states the skin feels tender. No rashes or lesions. He denies any significant eye drainage. He does report some increased difficulty with near vision over the last several days. Denies eye pain. No pain in the back of his head. He has taken Tylenol, ibuprofen, and Goody powders with temporary relief of about 15 minutes.  He denies any chest pain or shortness of breath. No numbness or tingling in his extremities. No difficulty with speech or swallowing. No history of headaches. He did have an episode today at work where he felt dizzy and lightheaded. His coworkers had him sit down and this resolved over about an hour. No recurrent episodes.  Past Medical History  Diagnosis Date  . Medical history non-contributory   . Diabetes mellitus without complication [facility])    Past Surgical History  Procedure Laterality Date  . No past surgeries     Family History  Problem Relation Age of Onset  . Diabetes Mellitus II Sister   . Diabetes Mellitus II Brother   . Diabetes Mellitus II Mother   . Diabetes Mellitus II Father    Social History  Substance Use Topics  . Smoking status: Current Every Day Smoker -- 0.25 packs/day    Types: Cigarettes  . Smokeless tobacco: None  . Alcohol Use: Yes     Comment: ocaasionally    Review of Systems As in history of present illness Allergies  Review of patient's allergies indicates no known allergies.  Home Medications   Prior to Admission medications    Medication Sig Start Date End Date Taking? Authorizing Provider  insulin aspart (NOVOLOG FLEXPEN) 100 UNIT/ML FlexPen Inject 5 Units into the skin 3 (three) times daily with meals. 07/04/13  Yes Lonia Blood, MD  aspirin EC 81 MG EC tablet Take 1 tablet (81 mg total) by mouth daily. 07/04/13   Lonia Blood, MD  atorvastatin (LIPITOR) 20 MG tablet Take 1 tablet (20 mg total) by mouth daily at 6 PM. 07/04/13   Lonia Blood, MD  Insulin Glargine (LANTUS SOLOSTAR) 100 UNIT/ML Solostar Pen Inject 35 Units into the skin daily at 10 pm. 07/04/13   Lonia Blood, MD  lisinopril (PRINIVIL,ZESTRIL) 2.5 MG tablet Take 1 tablet (2.5 mg total) by mouth daily. 07/04/13   Lonia Blood, MD  nortriptyline (PAMELOR) 25 MG capsule Take 1 capsule (25 mg total) by mouth at bedtime. 11/02/15   Charm Rings, MD   Meds Ordered and Administered this Visit  Medications - No data to display  BP 149/93 mmHg  Pulse 108  Temp(Src) 100.1 F (37.8 C) (Oral)  Resp 16  SpO2 97% No data found.   Physical Exam  Constitutional: He is oriented to person, place, and time. He appears well-developed and well-nourished. No distress.  HENT:  Mouth/Throat: Oropharynx is clear and moist.  No rashes or lesions on the face. He does have sensitivity to light touch throughout the right side of his face.  Eyes: EOM are normal.  Pupils are equal, round, and reactive to light.  Mild injection of the sclera bilaterally. Tono-Pen was attempted. Between 2 providers we were only able to get one reading on the right eye of 11. No readings registered when testing the left eye.  Neck: Neck supple.  Cardiovascular: Normal rate, regular rhythm and normal heart sounds.   No murmur heard. Pulmonary/Chest: Effort normal and breath sounds normal. No respiratory distress. He has no wheezes. He has no rales.  Lymphadenopathy:    He has no cervical adenopathy.  Neurological: He is alert and oriented to person, place, and time. No  cranial nerve deficit. He exhibits normal muscle tone. Coordination normal.    ED Course  Procedures (including critical care time)  Labs Review Labs Reviewed  GLUCOSE, CAPILLARY - Abnormal; Notable for the following:    Glucose-Capillary 305 (*)    All other components within normal limits    Imaging Review No results found.   MDM   1. Right facial pain    Neurologic exam is normal. With the sensitivity and facial involvement, concern is for trigeminal neuralgia. We'll try nortriptyline at bedtime for the next 2 weeks. If this does not improve or symptoms worsen, he will go to the emergency room.    Charm Rings, MD 11/02/15 2020

## 2015-11-02 NOTE — Discharge Instructions (Signed)
Your pain may be coming from an irritated trigeminal nerve. Take nortriptyline at bedtime for the next 2 weeks. I do not see any sign of anything going on inside your brain or with your eyes. If this is getting worse, please go to the emergency room.

## 2015-12-24 DIAGNOSIS — E089 Diabetes mellitus due to underlying condition without complications: Secondary | ICD-10-CM | POA: Diagnosis not present

## 2015-12-24 DIAGNOSIS — R634 Abnormal weight loss: Secondary | ICD-10-CM | POA: Diagnosis not present

## 2015-12-24 DIAGNOSIS — R5383 Other fatigue: Secondary | ICD-10-CM | POA: Diagnosis not present

## 2016-10-03 ENCOUNTER — Encounter (HOSPITAL_COMMUNITY): Payer: Self-pay | Admitting: Emergency Medicine

## 2016-10-03 ENCOUNTER — Observation Stay (HOSPITAL_COMMUNITY)
Admission: EM | Admit: 2016-10-03 | Discharge: 2016-10-05 | Disposition: A | Discharge status: Home / Self Care | Payer: BLUE CROSS/BLUE SHIELD | Attending: Internal Medicine | Admitting: Internal Medicine

## 2016-10-03 ENCOUNTER — Emergency Department (HOSPITAL_COMMUNITY): Payer: BLUE CROSS/BLUE SHIELD

## 2016-10-03 ENCOUNTER — Emergency Department (HOSPITAL_BASED_OUTPATIENT_CLINIC_OR_DEPARTMENT_OTHER): Payer: BLUE CROSS/BLUE SHIELD

## 2016-10-03 ENCOUNTER — Encounter (HOSPITAL_COMMUNITY)
Admission: EM | Disposition: A | Discharge status: Home / Self Care | Payer: Self-pay | Source: Home / Self Care | Attending: Emergency Medicine

## 2016-10-03 DIAGNOSIS — K3184 Gastroparesis: Secondary | ICD-10-CM | POA: Diagnosis not present

## 2016-10-03 DIAGNOSIS — R112 Nausea with vomiting, unspecified: Secondary | ICD-10-CM | POA: Insufficient documentation

## 2016-10-03 DIAGNOSIS — R000 Tachycardia, unspecified: Secondary | ICD-10-CM | POA: Diagnosis not present

## 2016-10-03 DIAGNOSIS — N179 Acute kidney failure, unspecified: Secondary | ICD-10-CM | POA: Diagnosis not present

## 2016-10-03 DIAGNOSIS — E784 Other hyperlipidemia: Secondary | ICD-10-CM

## 2016-10-03 DIAGNOSIS — E1110 Type 2 diabetes mellitus with ketoacidosis without coma: Secondary | ICD-10-CM | POA: Diagnosis not present

## 2016-10-03 DIAGNOSIS — I10 Essential (primary) hypertension: Secondary | ICD-10-CM | POA: Diagnosis not present

## 2016-10-03 DIAGNOSIS — F17210 Nicotine dependence, cigarettes, uncomplicated: Secondary | ICD-10-CM | POA: Diagnosis not present

## 2016-10-03 DIAGNOSIS — Z794 Long term (current) use of insulin: Secondary | ICD-10-CM | POA: Insufficient documentation

## 2016-10-03 DIAGNOSIS — E1143 Type 2 diabetes mellitus with diabetic autonomic (poly)neuropathy: Secondary | ICD-10-CM | POA: Insufficient documentation

## 2016-10-03 DIAGNOSIS — Z7982 Long term (current) use of aspirin: Secondary | ICD-10-CM | POA: Insufficient documentation

## 2016-10-03 DIAGNOSIS — R9431 Abnormal electrocardiogram [ECG] [EKG]: Secondary | ICD-10-CM | POA: Diagnosis not present

## 2016-10-03 DIAGNOSIS — R197 Diarrhea, unspecified: Secondary | ICD-10-CM

## 2016-10-03 DIAGNOSIS — Z79899 Other long term (current) drug therapy: Secondary | ICD-10-CM | POA: Diagnosis not present

## 2016-10-03 DIAGNOSIS — E785 Hyperlipidemia, unspecified: Secondary | ICD-10-CM | POA: Insufficient documentation

## 2016-10-03 DIAGNOSIS — R1110 Vomiting, unspecified: Secondary | ICD-10-CM | POA: Diagnosis present

## 2016-10-03 DIAGNOSIS — I248 Other forms of acute ischemic heart disease: Secondary | ICD-10-CM | POA: Diagnosis not present

## 2016-10-03 HISTORY — DX: Type 2 diabetes mellitus without complications: E11.9

## 2016-10-03 LAB — URINALYSIS, ROUTINE W REFLEX MICROSCOPIC
Bacteria, UA: NONE SEEN
Bilirubin Urine: NEGATIVE
Glucose, UA: >=500 mg/dL — AB
Hgb urine dipstick: NEGATIVE
Ketones, ur: 80 mg/dL — AB
Leukocytes, UA: NEGATIVE
Nitrite: NEGATIVE
Protein, ur: NEGATIVE mg/dL
Specific Gravity, Urine: 1.028 (ref 1.005–1.030)
Squamous Epithelial / LPF: NONE SEEN
pH: 5 (ref 5.0–8.0)

## 2016-10-03 LAB — GLUCOSE, CAPILLARY
Glucose-Capillary: 113 mg/dL — ABNORMAL HIGH (ref 65–99)
Glucose-Capillary: 111 mg/dL — ABNORMAL HIGH (ref 65–99)
Glucose-Capillary: 109 mg/dL — ABNORMAL HIGH (ref 65–99)

## 2016-10-03 LAB — COMPREHENSIVE METABOLIC PANEL
ALT: 15 U/L — ABNORMAL LOW (ref 17–63)
AST: 15 U/L (ref 15–41)
Albumin: 4.8 g/dL (ref 3.5–5.0)
Alkaline Phosphatase: 87 U/L (ref 38–126)
Anion gap: 23 — ABNORMAL HIGH (ref 5–15)
BUN: 15 mg/dL (ref 6–20)
CO2: 15 mmol/L — ABNORMAL LOW (ref 22–32)
Calcium: 10.2 mg/dL (ref 8.9–10.3)
Chloride: 96 mmol/L — ABNORMAL LOW (ref 101–111)
Creatinine, Ser: 1.32 mg/dL — ABNORMAL HIGH (ref 0.61–1.24)
GFR calc Af Amer: >60 mL/min (ref >60)
GFR calc non Af Amer: >60 mL/min (ref >60)
Glucose, Bld: 341 mg/dL — ABNORMAL HIGH (ref 65–99)
Potassium: 4.6 mmol/L (ref 3.5–5.1)
Sodium: 134 mmol/L — ABNORMAL LOW (ref 135–145)
Total Bilirubin: 1.6 mg/dL — ABNORMAL HIGH (ref 0.3–1.2)
Total Protein: 8.4 g/dL — ABNORMAL HIGH (ref 6.5–8.1)

## 2016-10-03 LAB — I-STAT CG4 LACTIC ACID, ED
Lactic Acid, Venous: 2.73 mmol/L (ref 0.5–1.9)
Lactic Acid, Venous: 2.17 mmol/L (ref 0.5–1.9)

## 2016-10-03 LAB — CBG MONITORING, ED
Glucose-Capillary: 376 mg/dL — ABNORMAL HIGH (ref 65–99)
Glucose-Capillary: 148 mg/dL — ABNORMAL HIGH (ref 65–99)
Glucose-Capillary: 236 mg/dL — ABNORMAL HIGH (ref 65–99)
Glucose-Capillary: 226 mg/dL — ABNORMAL HIGH (ref 65–99)
Glucose-Capillary: 188 mg/dL — ABNORMAL HIGH (ref 65–99)
Glucose-Capillary: 179 mg/dL — ABNORMAL HIGH (ref 65–99)

## 2016-10-03 LAB — I-STAT VENOUS BLOOD GAS, ED
Acid-base deficit: 8 mmol/L — ABNORMAL HIGH (ref 0.0–2.0)
Bicarbonate: 16.6 mmol/L — ABNORMAL LOW (ref 20.0–28.0)
O2 Saturation: 60 %
TCO2: 18 mmol/L (ref 0–100)
pCO2, Ven: 32.5 mmHg — ABNORMAL LOW (ref 44.0–60.0)
pH, Ven: 7.316 (ref 7.250–7.430)
pO2, Ven: 34 mmHg (ref 32.0–45.0)

## 2016-10-03 LAB — I-STAT TROPONIN, ED: Troponin i, poc: 0 ng/mL (ref 0.00–0.08)

## 2016-10-03 LAB — CBC
HCT: 50.8 % (ref 39.0–52.0)
Hemoglobin: 17 g/dL (ref 13.0–17.0)
MCH: 27.4 pg (ref 26.0–34.0)
MCHC: 33.5 g/dL (ref 30.0–36.0)
MCV: 81.9 fL (ref 78.0–100.0)
Platelets: 248 10*3/uL (ref 150–400)
RBC: 6.2 MIL/uL — ABNORMAL HIGH (ref 4.22–5.81)
RDW: 13.2 % (ref 11.5–15.5)
WBC: 8.8 10*3/uL (ref 4.0–10.5)

## 2016-10-03 LAB — BASIC METABOLIC PANEL
Anion gap: 14 (ref 5–15)
Anion gap: 16 — ABNORMAL HIGH (ref 5–15)
BUN: 12 mg/dL (ref 6–20)
BUN: 11 mg/dL (ref 6–20)
CO2: 18 mmol/L — ABNORMAL LOW (ref 22–32)
CO2: 22 mmol/L (ref 22–32)
Calcium: 9.2 mg/dL (ref 8.9–10.3)
Calcium: 9.3 mg/dL (ref 8.9–10.3)
Chloride: 103 mmol/L (ref 101–111)
Chloride: 95 mmol/L — ABNORMAL LOW (ref 101–111)
Creatinine, Ser: 0.97 mg/dL (ref 0.61–1.24)
Creatinine, Ser: 1.01 mg/dL (ref 0.61–1.24)
GFR calc Af Amer: >60 mL/min (ref >60)
GFR calc Af Amer: >60 mL/min (ref >60)
GFR calc non Af Amer: >60 mL/min (ref >60)
GFR calc non Af Amer: >60 mL/min (ref >60)
Glucose, Bld: 231 mg/dL — ABNORMAL HIGH (ref 65–99)
Glucose, Bld: 285 mg/dL — ABNORMAL HIGH (ref 65–99)
Potassium: 4.3 mmol/L (ref 3.5–5.1)
Potassium: 3.8 mmol/L (ref 3.5–5.1)
Sodium: 135 mmol/L (ref 135–145)
Sodium: 133 mmol/L — ABNORMAL LOW (ref 135–145)

## 2016-10-03 LAB — ECHOCARDIOGRAM COMPLETE
Height: 68 in
Weight: 2672 oz

## 2016-10-03 LAB — APTT: aPTT: 27 seconds (ref 24–36)

## 2016-10-03 LAB — PHOSPHORUS: Phosphorus: 2.8 mg/dL (ref 2.5–4.6)

## 2016-10-03 LAB — BETA-HYDROXYBUTYRIC ACID: Beta-Hydroxybutyric Acid: 7.04 mmol/L — ABNORMAL HIGH (ref 0.05–0.27)

## 2016-10-03 LAB — MRSA PCR SCREENING: MRSA by PCR: NEGATIVE

## 2016-10-03 LAB — MAGNESIUM: Magnesium: 1.8 mg/dL (ref 1.7–2.4)

## 2016-10-03 LAB — LIPASE, BLOOD: Lipase: 25 U/L (ref 11–51)

## 2016-10-03 SURGERY — LEFT HEART CATH AND CORONARY ANGIOGRAPHY
Anesthesia: LOCAL

## 2016-10-03 MED ORDER — SODIUM CHLORIDE 0.9 % IV SOLN
INTRAVENOUS | Status: DC
Start: 2016-10-03 — End: 2016-10-03

## 2016-10-03 MED ORDER — ONDANSETRON 4 MG PO TBDP
ORAL_TABLET | ORAL | Status: AC
  Filled 2016-10-03: qty 1

## 2016-10-03 MED ORDER — SODIUM CHLORIDE 0.9 % IV SOLN
INTRAVENOUS | Status: DC
  Administered 2016-10-03: 3.2 [IU]/h via INTRAVENOUS
  Filled 2016-10-03: qty 2.5

## 2016-10-03 MED ORDER — INSULIN ASPART 100 UNIT/ML ~~LOC~~ SOLN: 0.0000 [IU] | Freq: Three times a day (TID) | SUBCUTANEOUS | Status: DC

## 2016-10-03 MED ORDER — DEXTROSE-NACL 5-0.45 % IV SOLN
INTRAVENOUS | Status: DC
  Administered 2016-10-03: 10:00:00 via INTRAVENOUS

## 2016-10-03 MED ORDER — SODIUM CHLORIDE 0.9 % IV BOLUS (SEPSIS)
1000.0000 mL | Freq: Once | INTRAVENOUS | Status: AC
  Administered 2016-10-03: 1000 mL via INTRAVENOUS

## 2016-10-03 MED ORDER — INSULIN ASPART 100 UNIT/ML ~~LOC~~ SOLN
0.0000 [IU] | Freq: Every day | SUBCUTANEOUS | Status: DC
  Administered 2016-10-03: 3 [IU] via SUBCUTANEOUS

## 2016-10-03 MED ORDER — SODIUM CHLORIDE 0.9 % IV SOLN
INTRAVENOUS | Status: DC
  Administered 2016-10-03: 1.3 [IU]/h via INTRAVENOUS
  Filled 2016-10-03: qty 1

## 2016-10-03 MED ORDER — SODIUM CHLORIDE 0.9 % IV SOLN
INTRAVENOUS | Status: DC
  Filled 2016-10-03: qty 1

## 2016-10-03 MED ORDER — ONDANSETRON HCL 4 MG/2ML IJ SOLN
4.0000 mg | Freq: Four times a day (QID) | INTRAMUSCULAR | Status: DC | PRN
  Administered 2016-10-03 – 2016-10-05 (×2): 4 mg via INTRAVENOUS
  Filled 2016-10-03 (×2): qty 2

## 2016-10-03 MED ORDER — ENOXAPARIN SODIUM 40 MG/0.4ML ~~LOC~~ SOLN
40.0000 mg | SUBCUTANEOUS | Status: DC
Start: 2016-10-03 — End: 2016-10-05
  Administered 2016-10-03 – 2016-10-04 (×2): 40 mg via SUBCUTANEOUS
  Filled 2016-10-03 (×2): qty 0.4

## 2016-10-03 MED ORDER — SODIUM CHLORIDE 0.9 % IV SOLN
INTRAVENOUS | Status: DC
  Administered 2016-10-03: 19:00:00 via INTRAVENOUS

## 2016-10-03 MED ORDER — INSULIN GLARGINE 100 UNIT/ML ~~LOC~~ SOLN
15.0000 [IU] | Freq: Every day | SUBCUTANEOUS | Status: DC
  Administered 2016-10-03 – 2016-10-04 (×2): 15 [IU] via SUBCUTANEOUS
  Filled 2016-10-03 (×2): qty 0.15

## 2016-10-03 MED ORDER — POTASSIUM CHLORIDE 10 MEQ/100ML IV SOLN: 10.0000 meq | INTRAVENOUS | Status: DC

## 2016-10-03 MED ORDER — SODIUM CHLORIDE 0.9 % IV SOLN
INTRAVENOUS | Status: DC
  Administered 2016-10-03: 0.9 [IU]/h via INTRAVENOUS
  Filled 2016-10-03: qty 1

## 2016-10-03 MED ORDER — PROMETHAZINE HCL 25 MG/ML IJ SOLN
25.0000 mg | Freq: Four times a day (QID) | INTRAMUSCULAR | Status: DC | PRN
  Administered 2016-10-03 – 2016-10-04 (×2): 25 mg via INTRAVENOUS
  Filled 2016-10-03 (×2): qty 1

## 2016-10-03 MED ORDER — PROMETHAZINE HCL 25 MG/ML IJ SOLN
25.0000 mg | Freq: Once | INTRAMUSCULAR | Status: AC
  Administered 2016-10-03: 25 mg via INTRAVENOUS
  Filled 2016-10-03: qty 1

## 2016-10-03 MED ORDER — ONDANSETRON 4 MG PO TBDP
4.0000 mg | ORAL_TABLET | Freq: Once | ORAL | Status: AC | PRN
  Administered 2016-10-03: 4 mg via ORAL

## 2016-10-03 MED ORDER — POTASSIUM CHLORIDE CRYS ER 20 MEQ PO TBCR
20.0000 meq | EXTENDED_RELEASE_TABLET | Freq: Once | ORAL | Status: AC
  Administered 2016-10-03: 20 meq via ORAL
  Filled 2016-10-03: qty 1

## 2016-10-03 MED ORDER — ONDANSETRON HCL 4 MG/2ML IJ SOLN
4.0000 mg | Freq: Once | INTRAMUSCULAR | Status: AC
  Administered 2016-10-03: 4 mg via INTRAVENOUS
  Filled 2016-10-03: qty 2

## 2016-10-03 NOTE — ED Notes (Signed)
Patient was triage by this RN

## 2016-10-03 NOTE — Plan of Care (Signed)
Problem: Education: Goal: Knowledge of [place] [facility] Education information/materials will improve Outcome: Progressing Pt understands to call if assistance  Needed before getting up.

## 2016-10-03 NOTE — Progress Notes (Signed)
Physician notified: Konrad Dolores At: 1500  Regarding: Please call re: DKA protocol and transition orders placed for 1345.

## 2016-10-03 NOTE — Progress Notes (Signed)
  Echocardiogram 2D Echocardiogram has been performed.  Leta Jungling M 10/03/2016, 10:45 AM

## 2016-10-03 NOTE — H&P (Signed)
History and Physical    Joshua Escobar XBJ:[id number] DOB: [birth year] DOA: 10/03/2016   PCP: Renaye Rakers, MD   Patient coming from/Resides with: Private residence/brother  Admission status: Observation/SDU-it may be medically necessary to stay a minimum 2 midnights to rule out impending and/or unexpected changes in physiologic status that may differ from initial evaluation performed in the ER and/or at time of admission-consider reevaluation and admission status in 24 hours.   Chief Complaint: Intractable nausea and vomiting  HPI: Joshua Escobar is a 46 y.o. male with medical history significant for type 2 diabetes on insulin (diagnosed about 3-4 years ago), hypertension and dyslipidemia. Patient presented to the ER after 48 hours of nausea and vomiting without fever or blood. No diarrhea. Reports diffuse mild abdominal pain. States sugars at home have been a little high. States has never had DKA before. No known sick contacts. Patient denies noncompliance with medications when asked specifically regarding Lantus and meal coverage. Patient presented tachycardic with ST elevation in anterior leads and downsloping ST segments with T-wave inversion in the inferior lateral leads. Cardiology was consulted. Bedside echo completed and it was felt patient's abnormal EKG related to LVH/LV strain. Code STEMI was canceled.  ED Course:  Vital Signs: BP 106/81   Pulse 99   Temp 99 F (37.2 C) (Oral)   Resp 16   Ht 5\' 8"  (1.727 m)   Wt 75.8 kg (167 lb)   SpO2 100%   BMI 25.39 kg/m  PCXR: Neg Lab data: Sodium 134, potassium 4.6, chloride 96, CO2 15, glucose 341, BUN 15, creatinine 1.32, calcium 10.2, anion gap 23, LFTs not elevated except for mild elevation in total bilirubin 1.6, poc troponin 0.00, lactic acid 2.73 with repeat 2.17, white count 8800 differential not obtained, hemoglobin 17, platelets 248,000, beta hydroxybutyric acid elevated 7.04, urinalysis abnormal with glycosuria greater than 500  and ketones 80 otherwise unremarkable Medications and treatments: Zofran 4 mg 1, insulin infusion titrated, normal saline 2 L, Phenergan 25 mg IV 1  Review of Systems:  In addition to the HPI above,  No Fever-chills, myalgias or other constitutional symptoms No Headache, changes with Vision or hearing, new weakness, tingling, numbness in any extremity, dizziness, dysarthria or word finding difficulty, gait disturbance or imbalance, tremors or seizure activity No problems swallowing food or Liquids, indigestion/reflux, choking or coughing while eating, abdominal pain with or after eating No Chest pain, Cough or Shortness of Breath, palpitations, orthopnea or DOE No melena,hematochezia, dark tarry stools, constipation No dysuria, malodorous urine, hematuria or flank pain No new skin rashes, lesions, masses or bruises, No new joint pains, aches, swelling or redness No recent unintentional weight gain or loss    Past Medical History:  Diagnosis Date  . Diabetes mellitus without complication (HCC)   . Medical history non-contributory     Past Surgical History:  Procedure Laterality Date  . NO PAST SURGERIES      Social History   Social History  . Marital status: Single    Spouse name: N/A  . Number of children: N/A  . Years of education: N/A   Occupational History  . Not on file.   Social History Main Topics  . Smoking status: Current Every Day Smoker    Packs/day: 0.25    Types: Cigarettes  . Smokeless tobacco: Never Used  . Alcohol use Yes     Comment: ocaasionally  . Drug use: No  . Sexual activity: Not on file   Other Topics Concern  .  Not on file   Social History Narrative  . No narrative on file    Mobility: Independent Work history: Works on [employer])   No Known Allergies  Family History  Problem Relation Age of Onset  . Diabetes Mellitus II Sister   . Diabetes Mellitus II Brother   . Diabetes Mellitus II Mother   . Diabetes  Mellitus II Father      Prior to Admission medications   Medication Sig Start Date End Date Taking? Authorizing Provider  insulin aspart (NOVOLOG FLEXPEN) 100 UNIT/ML FlexPen Inject 5 Units into the skin 3 (three) times daily with meals. 07/04/13  Yes Lonia Blood, MD  aspirin EC 81 MG EC tablet Take 1 tablet (81 mg total) by mouth daily. Patient not taking: Reported on 10/03/2016 07/04/13   Lonia Blood, MD  atorvastatin (LIPITOR) 20 MG tablet Take 1 tablet (20 mg total) by mouth daily at 6 PM. Patient not taking: Reported on 10/03/2016 07/04/13   Lonia Blood, MD  Insulin Glargine (LANTUS SOLOSTAR) 100 UNIT/ML Solostar Pen Inject 35 Units into the skin daily at 10 pm. Patient not taking: Reported on 10/03/2016 07/04/13   Lonia Blood, MD  lisinopril (PRINIVIL,ZESTRIL) 2.5 MG tablet Take 1 tablet (2.5 mg total) by mouth daily. Patient not taking: Reported on 10/03/2016 07/04/13   Lonia Blood, MD  nortriptyline (PAMELOR) 25 MG capsule Take 1 capsule (25 mg total) by mouth at bedtime. Patient not taking: Reported on 10/03/2016 11/02/15   Charm Rings, MD    Physical Exam: Vitals:   10/03/16 1045 10/03/16 1100 10/03/16 1115 10/03/16 1130  BP: 110/80 119/77 107/75 106/81  Pulse: 93 95 99 99  Resp: 16 17 17 16   Temp:      TempSrc:      SpO2: 100% 100% 100% 100%  Weight:      Height:          Constitutional: NAD, calm, comfortable but very sleepy Eyes: PERRL, lids and conjunctivae normal ENMT: Mucous membranes are dry. Posterior pharynx clear of any exudate or lesions.Normal dentition.  Neck: normal, supple, no masses, no thyromegaly Respiratory: clear to auscultation bilaterally, no wheezing, no crackles. Normal respiratory effort. No accessory muscle use.  Cardiovascular: Regular, tachycardic rate and rhythm, no murmurs / rubs / gallops. No extremity edema. 2+ pedal pulses. No carotid bruits.  Abdomen: no tenderness, no masses palpated. No hepatosplenomegaly. Bowel  sounds positive.  Musculoskeletal: no clubbing / cyanosis. No joint deformity upper and lower extremities. Good ROM, no contractures. Normal muscle tone.  Skin: no rashes, lesions, ulcers. No induration Neurologic: CN 2-12 grossly intact. Sensation intact, DTR normal. Strength 5/5 x all 4 extremities. Lethargic but awakens easily to voice and tactile stimulation Psychiatric: Normal judgment and insight. Awakens and is oriented x 3. Normal mood.    Labs on Admission: I have personally reviewed following labs and imaging studies  CBC:  Recent Labs Lab 10/03/16 0636  WBC 8.8  HGB 17.0  HCT 50.8  MCV 81.9  PLT 248   Basic Metabolic Panel:  Recent Labs Lab 10/03/16 0636  NA 134*  K 4.6  CL 96*  CO2 15*  GLUCOSE 341*  BUN 15  CREATININE 1.32*  CALCIUM 10.2   GFR: Estimated Creatinine Clearance: 69.1 mL/min (A) (by C-G formula based on SCr of 1.32 mg/dL (H)). Liver Function Tests:  Recent Labs Lab 10/03/16 0636  AST 15  ALT 15*  ALKPHOS 87  BILITOT 1.6*  PROT  8.4*  ALBUMIN 4.8    Recent Labs Lab 10/03/16 0636  LIPASE 25   No results for input(s): AMMONIA in the last 168 hours. Coagulation Profile: No results for input(s): INR, PROTIME in the last 168 hours. Cardiac Enzymes: No results for input(s): CKTOTAL, CKMB, CKMBINDEX, TROPONINI in the last 168 hours. BNP (last 3 results) No results for input(s): PROBNP in the last 8760 hours. HbA1C: No results for input(s): HGBA1C in the last 72 hours. CBG:  Recent Labs Lab 10/03/16 0805 10/03/16 0945 10/03/16 1049  GLUCAP 376* 148* 236*   Lipid Profile: No results for input(s): CHOL, HDL, LDLCALC, TRIG, CHOLHDL, LDLDIRECT in the last 72 hours. Thyroid Function Tests: No results for input(s): TSH, T4TOTAL, FREET4, T3FREE, THYROIDAB in the last 72 hours. Anemia Panel: No results for input(s): VITAMINB12, FOLATE, FERRITIN, TIBC, IRON, RETICCTPCT in the last 72 hours. Urine analysis:    Component Value  Date/Time   COLORURINE YELLOW 10/03/2016 0632   APPEARANCEUR CLEAR 10/03/2016 0632   LABSPEC 1.028 10/03/2016 0632   PHURINE 5.0 10/03/2016 0632   GLUCOSEU >=500 (A) 10/03/2016 0632   HGBUR NEGATIVE 10/03/2016 0632   BILIRUBINUR NEGATIVE 10/03/2016 0632   KETONESUR 80 (A) 10/03/2016 0632   PROTEINUR NEGATIVE 10/03/2016 0632   UROBILINOGEN 0.2 06/30/2013 2031   NITRITE NEGATIVE 10/03/2016 0632   LEUKOCYTESUR NEGATIVE 10/03/2016 3358   Sepsis Labs: @LABRCNTIP (procalcitonin:4,lacticidven:4) )No results found for this or any previous visit (from the past 240 hour(s)).   Radiological Exams on Admission: Dg Chest Portable 1 View  Result Date: 10/03/2016 CLINICAL DATA:  STEMI, diabetes, current smoker. EXAM: PORTABLE CHEST 1 VIEW COMPARISON:  None in PACs FINDINGS: The lungs are adequately inflated and clear. The heart and pulmonary vascularity are normal. The mediastinum is normal in width. There is no pleural effusion. The bony thorax exhibits no acute abnormality. IMPRESSION: No pneumonia, CHF, nor other acute cardiopulmonary abnormality. Electronically Signed   By: David  [place] M.D.   On: 10/03/2016 09:35    EKG: (Independently reviewed) Sinus tachycardia with ST segment elevation in V1 and V2 with downsloping ST segments with T-wave inversion in inferior lateral leads. EKG reviewed by cardiologist and felt to be nonischemic in nature with complementary bedside echo completed during their evaluation  Assessment/Plan Principal Problem:   DKA, type 2 -Presents with nausea and vomiting likely from acute gastroparesis in the setting of DKA -AG 23 -Patient denies noncompliance with Lantus and meal coverage but upon review of medication reconciliation appears he was only taking milk coverage-this needs to be clarified -Case management consulted to ensure patient able to obtain appropriate insulins after discharge -Continue insulin drip until AG then transition to long-acting insulin with  SSI -Elevated beta hydroxybutyric acid concerning for poorly controlled diabetes prior to admission -Continue volume resuscitation -Electrolytes every 4 hours until AG closed (obtain stat now since last completed at 6:30 AM) -Noncaloric clears until AG closed -Diabetes educator consultation  Active Problems:   Acute kidney injury  -Baseline renal function 8 and 0.78 -Current renal function 15 and 1.32 -Continue hydration -Labs in a.m.    Abnormal EKG -Echocardiogram today reveals preserved LV function without regional wall motion abnormalities as well as normal left ventricular diastolic function and no apparent LVH or valvular abnormalities -Evaluated by cardiology and no urgent cardiac evaluation indicated -Patient not reporting any chest pain or dyspnea on exertion at time of admission    HTN (hypertension) -Patient states no longer taking ACE inhibitor    Nausea & vomiting -Secondary to  gastroparesis from DKA -Continue Zofran and Phenergan PRN    HLD (hyperlipidemia) -States no longer taking Lipitor -Lipid panel      DVT prophylaxis: Lovenox Code Status: Full  Family Communication: None Disposition Plan: Home Consults called: None    ELLIS,ALLISON L. ANP-BC [facility] Pager [phone]   If 7PM-7AM, please contact night-coverage [email or web address] Password [facility]  10/03/2016, 11:47 AM

## 2016-10-03 NOTE — ED Provider Notes (Signed)
[facility]-EMERGENCY DEPT Provider Note   CSN: [id number] Arrival date & time: 10/03/16  4098     History   Chief Complaint Chief Complaint  Patient presents with  . Emesis  . Abdominal Pain  . Headache    HPI Joshua Escobar is a 46 y.o. male.  HPI  46 year old male with a history of diabetes who presents today with 2 days of nausea and vomiting. He denies any fever, chills, abdominal pain, diarrhea, increased frequency of urination, or pain with urination. He states he had similar symptoms one time before he found out that he had diabetes. He takes insulin but has not been checking his blood sugars over the last 2 days due to increased sleepiness.  He states that his sugars usually run about 90. He takes a sliding scale of insulin with 3 meals. He describes the emesis as initially having a little bit of red color which cleared and then became green. After that the emesis was with her he has tried to eat or drink.  Past Medical History:  Diagnosis Date  . Diabetes mellitus without complication (HCC)   . Medical history non-contributory     Patient Active Problem List   Diagnosis Date Noted  . DM (diabetes mellitus) type 2, uncontrolled, with ketoacidosis (HCC) 07/04/2013    Past Surgical History:  Procedure Laterality Date  . NO PAST SURGERIES         Home Medications    Prior to Admission medications   Medication Sig Start Date End Date Taking? Authorizing Provider  aspirin EC 81 MG EC tablet Take 1 tablet (81 mg total) by mouth daily. 07/04/13   Lonia Blood, MD  atorvastatin (LIPITOR) 20 MG tablet Take 1 tablet (20 mg total) by mouth daily at 6 PM. 07/04/13   Lonia Blood, MD  insulin aspart (NOVOLOG FLEXPEN) 100 UNIT/ML FlexPen Inject 5 Units into the skin 3 (three) times daily with meals. 07/04/13   Lonia Blood, MD  Insulin Glargine (LANTUS SOLOSTAR) 100 UNIT/ML Solostar Pen Inject 35 Units into the skin daily at 10 pm. 07/04/13   Lonia Blood, MD    lisinopril (PRINIVIL,ZESTRIL) 2.5 MG tablet Take 1 tablet (2.5 mg total) by mouth daily. 07/04/13   Lonia Blood, MD  nortriptyline (PAMELOR) 25 MG capsule Take 1 capsule (25 mg total) by mouth at bedtime. 11/02/15   Charm Rings, MD    Family History Family History  Problem Relation Age of Onset  . Diabetes Mellitus II Sister   . Diabetes Mellitus II Brother   . Diabetes Mellitus II Mother   . Diabetes Mellitus II Father     Social History Social History  Substance Use Topics  . Smoking status: Current Every Day Smoker    Packs/day: 0.25    Types: Cigarettes  . Smokeless tobacco: Never Used  . Alcohol use Yes     Comment: ocaasionally     Allergies   Patient has no known allergies.   Review of Systems Review of Systems  Constitutional: Positive for activity change, appetite change and fatigue.  HENT: Negative.   Eyes: Negative.   Respiratory: Negative.   Cardiovascular: Negative.   Gastrointestinal: Positive for nausea and vomiting.  Endocrine: Negative.   Genitourinary: Positive for difficulty urinating.  Musculoskeletal: Negative.   Skin: Negative.   Allergic/Immunologic: Negative.   Neurological: Negative.   Hematological: Negative.   Psychiatric/Behavioral: Negative.   All other systems reviewed and are negative.    Physical Exam  Updated Vital Signs BP (!) 143/101   Pulse 94   Temp 99 F (37.2 C) (Oral)   Resp 18   Ht 5\' 8"  (1.727 m)   Wt 75.8 kg   SpO2 100%   BMI 25.39 kg/m   Physical Exam  Constitutional: He is oriented to person, place, and time. He appears well-developed and well-nourished.  HENT:  Head: Normocephalic and atraumatic.  Mucous membranes appear dry  Eyes: Pupils are equal, round, and reactive to light.  Neck: Normal range of motion.  Cardiovascular: Tachycardia present.   Pulmonary/Chest: Effort normal and breath sounds normal.  Abdominal: Soft. Bowel sounds are normal. He exhibits no distension. There is no  tenderness. There is no guarding.  Musculoskeletal: Normal range of motion.  Neurological: He is alert and oriented to person, place, and time. He displays normal reflexes. No cranial nerve deficit. Coordination normal.  Skin: Skin is warm. Capillary refill takes less than 2 seconds.  Skin appears dry with some tenting  Psychiatric: He has a normal mood and affect.  Nursing note and vitals reviewed.    ED Treatments / Results  Labs (all labs ordered are listed, but only abnormal results are displayed) Labs Reviewed  COMPREHENSIVE METABOLIC PANEL - Abnormal; Notable for the following:       Result Value   Sodium 134 (*)    Chloride 96 (*)    CO2 15 (*)    Glucose, Bld 341 (*)    Creatinine, Ser 1.32 (*)    Total Protein 8.4 (*)    ALT 15 (*)    Total Bilirubin 1.6 (*)    Anion gap 23 (*)    All other components within normal limits  CBC - Abnormal; Notable for the following:    RBC 6.20 (*)    All other components within normal limits  URINALYSIS, ROUTINE W REFLEX MICROSCOPIC - Abnormal; Notable for the following:    Glucose, UA >=500 (*)    Ketones, ur 80 (*)    All other components within normal limits  BETA-HYDROXYBUTYRIC ACID - Abnormal; Notable for the following:    Beta-Hydroxybutyric Acid 7.04 (*)    All other components within normal limits  I-STAT VENOUS BLOOD GAS, ED - Abnormal; Notable for the following:    pCO2, Ven 32.5 (*)    Bicarbonate 16.6 (*)    Acid-base deficit 8.0 (*)    All other components within normal limits  I-STAT CG4 LACTIC ACID, ED - Abnormal; Notable for the following:    Lactic Acid, Venous 2.73 (*)    All other components within normal limits  CBG MONITORING, ED - Abnormal; Notable for the following:    Glucose-Capillary 376 (*)    All other components within normal limits  CULTURE, BLOOD (ROUTINE X 2)  CULTURE, BLOOD (ROUTINE X 2)  LIPASE, BLOOD    EKG  EKG Interpretation  Date/Time:  Tuesday Oct 03 2016 08:48:24 EDT Ventricular  Rate:  103 PR Interval:    QRS Duration: 88 QT Interval:  321 QTC Calculation: 421 R Axis:   73 Text Interpretation:  Sinus tachycardia Biatrial enlargement Anteroseptal infarct, possibly acute ** ** ACUTE MI / STEMI ** ** Confirmed by Andrika Peraza MD, Saysha Menta ([id number]) on 10/03/2016 9:14:30 AM      EKG with st elevatin v1-3 and some reciprocity in inferior leads.  Patient not having chest pain.  Given dka, plan activation of code stemi.  Discussed with Dr. Eldridge Dace and will see in ed. Troponin pending.  Radiology No  results found.  Procedures Procedures (including critical care time)  Medications Ordered in ED Medications  ondansetron (ZOFRAN-ODT) 4 MG disintegrating tablet (not administered)  insulin regular (NOVOLIN R,HUMULIN R) 250 Units in sodium chloride 0.9 % 250 mL (1 Units/mL) infusion (3.2 Units/hr Intravenous New Bag/Given 10/03/16 0842)  dextrose 5 %-0.45 % sodium chloride infusion (not administered)  sodium chloride 0.9 % bolus 1,000 mL (1,000 mLs Intravenous New Bag/Given 10/03/16 0842)    And  sodium chloride 0.9 % bolus 1,000 mL (1,000 mLs Intravenous New Bag/Given 10/03/16 0853)    And  0.9 %  sodium chloride infusion (not administered)  ondansetron (ZOFRAN-ODT) disintegrating tablet 4 mg (4 mg Oral Given 10/03/16 0637)  ondansetron (ZOFRAN) injection 4 mg (4 mg Intravenous Given 10/03/16 0844)     Initial Impression / Assessment and Plan / ED Course  I have reviewed the triage vital signs and the nursing notes.  Pertinent labs & imaging results that were available during my care of the patient were reviewed by me and considered in my medical decision making (see chart for details).     1- dka- patient with nausea and vomiting , ph 7.31 with bs 341. I v fluids given and insulin drip started per glucose stabilizer.   2- ekg with st elevation  Troponin is 0. Dr. Eldridge Dace bedside. Plan echocardiogram. If normal, plan admission to hospitalist. Echo verbally reported as normal wall  function. Plan admission to hospitalist. Final Clinical Impressions(s) / ED Diagnoses   Final diagnoses:  Diabetic ketoacidosis without coma associated with type 2 diabetes mellitus (HCC)  Intractable vomiting with nausea, unspecified vomiting type    New Prescriptions New Prescriptions   No medications on file     Margarita Grizzle, MD 10/03/16 1555

## 2016-10-03 NOTE — ED Notes (Signed)
Echo tech arrived to bedside.

## 2016-10-03 NOTE — ED Notes (Signed)
ACTIVATED CODE STEMI WITH CARELINK 

## 2016-10-03 NOTE — ED Notes (Signed)
Pt's CBG result was 188. Informed Anna - RN.

## 2016-10-03 NOTE — ED Triage Notes (Signed)
c/o nausea and vomiting onset 2 days ago states he isn't able to keep anything down

## 2016-10-03 NOTE — ED Notes (Signed)
Attempted to call Echo x 2

## 2016-10-03 NOTE — Progress Notes (Signed)
  Echocardiogram 2D Echocardiogram has been performed.  Leta Jungling M 10/03/2016, 10:44 AM

## 2016-10-03 NOTE — ED Notes (Signed)
ACTIVATED CODE STEMI WITH CARELINK

## 2016-10-03 NOTE — ED Notes (Signed)
Pt's CBG result was 226. Informed Anna - RN.

## 2016-10-04 DIAGNOSIS — Z794 Long term (current) use of insulin: Secondary | ICD-10-CM | POA: Diagnosis not present

## 2016-10-04 DIAGNOSIS — E1110 Type 2 diabetes mellitus with ketoacidosis without coma: Secondary | ICD-10-CM | POA: Diagnosis not present

## 2016-10-04 LAB — LIPID PANEL
Cholesterol: 180 mg/dL (ref 0–200)
HDL: 60 mg/dL (ref >40)
LDL Cholesterol: 105 mg/dL — ABNORMAL HIGH (ref 0–99)
Total CHOL/HDL Ratio: 3 RATIO
Triglycerides: 73 mg/dL (ref <150)
VLDL: 15 mg/dL (ref 0–40)

## 2016-10-04 LAB — BASIC METABOLIC PANEL
Anion gap: 16 — ABNORMAL HIGH (ref 5–15)
BUN: 9 mg/dL (ref 6–20)
CO2: 22 mmol/L (ref 22–32)
Calcium: 9.3 mg/dL (ref 8.9–10.3)
Chloride: 96 mmol/L — ABNORMAL LOW (ref 101–111)
Creatinine, Ser: 0.9 mg/dL (ref 0.61–1.24)
GFR calc Af Amer: >60 mL/min (ref >60)
GFR calc non Af Amer: >60 mL/min (ref >60)
Glucose, Bld: 267 mg/dL — ABNORMAL HIGH (ref 65–99)
Potassium: 3.8 mmol/L (ref 3.5–5.1)
Sodium: 134 mmol/L — ABNORMAL LOW (ref 135–145)

## 2016-10-04 LAB — HIV ANTIBODY (ROUTINE TESTING W REFLEX): HIV Screen 4th Generation wRfx: NONREACTIVE

## 2016-10-04 LAB — GLUCOSE, CAPILLARY
Glucose-Capillary: 255 mg/dL — ABNORMAL HIGH (ref 65–99)
Glucose-Capillary: 247 mg/dL — ABNORMAL HIGH (ref 65–99)
Glucose-Capillary: 219 mg/dL — ABNORMAL HIGH (ref 65–99)
Glucose-Capillary: 172 mg/dL — ABNORMAL HIGH (ref 65–99)
Glucose-Capillary: 178 mg/dL — ABNORMAL HIGH (ref 65–99)

## 2016-10-04 MED ORDER — ATORVASTATIN CALCIUM 40 MG PO TABS
40.0000 mg | ORAL_TABLET | Freq: Every day | ORAL | Status: DC
  Administered 2016-10-04: 40 mg via ORAL
  Filled 2016-10-04: qty 1

## 2016-10-04 MED ORDER — INSULIN GLARGINE 100 UNIT/ML ~~LOC~~ SOLN
20.0000 [IU] | Freq: Every day | SUBCUTANEOUS | Status: DC
  Filled 2016-10-04: qty 0.2

## 2016-10-04 MED ORDER — LISINOPRIL 5 MG PO TABS
5.0000 mg | ORAL_TABLET | Freq: Every day | ORAL | Status: DC
  Administered 2016-10-04 – 2016-10-05 (×2): 5 mg via ORAL
  Filled 2016-10-04 (×2): qty 1

## 2016-10-04 MED ORDER — INSULIN ASPART 100 UNIT/ML ~~LOC~~ SOLN
0.0000 [IU] | Freq: Three times a day (TID) | SUBCUTANEOUS | Status: DC
  Administered 2016-10-04: 5 [IU] via SUBCUTANEOUS
  Administered 2016-10-04 – 2016-10-05 (×2): 3 [IU] via SUBCUTANEOUS
  Administered 2016-10-05: 5 [IU] via SUBCUTANEOUS

## 2016-10-04 NOTE — Care Management Note (Signed)
Case Management Note  Patient Details  Name: Joshua Escobar MRN: [id number] Date of Birth: 10/26/1970  Subjective/Objective:   Presents with DKA , NCM awaiting benefit check for lantus and levemir to see if this is covered by patients insurance ( pen or vial)                Action/Plan: NCM will follow for dc needs.  Expected Discharge Date:                  Expected Discharge Plan:  Home/Self Care  In-House Referral:     Discharge planning Services  CM Consult  Post Acute Care Choice:    Choice offered to:     DME Arranged:    DME Agency:     HH Arranged:    HH Agency:     Status of Service:  In process, will continue to follow  If discussed at Long Length of Stay Meetings, dates discussed:    Additional Comments:  Leone Haven, RN 10/04/2016, 1:22 PM

## 2016-10-04 NOTE — Progress Notes (Addendum)
Inpatient Diabetes Program Recommendations  AACE/ADA: New Consensus Statement on Inpatient Glycemic Control (2015)  Target Ranges:  Prepandial:   less than 140 mg/dL      Peak postprandial:   less than 180 mg/dL (1-2 hours)      Critically ill patients:  140 - 180 mg/dL   Review of Glycemic Control  Diabetes history: DM 2 Outpatient Diabetes medications: Lantus 35 units, Novolog 5 units with meals Current orders for Inpatient glycemic control: Lantus 15 units, Novolog Sensitive Correction + HS scale  Inpatient Diabetes Program Recommendations:   Fasting glucose in the 200's. Consider increasing Lantus to 20 units. Consider an A1c level to assess glucose control over the past 2-3 months.  Spoke with patient about DM control. Patient reports "running out of Lantus 2 months ago." A1c back in February was 13.2%. Patient reports being able to obtain Lantus and Novolog and meter supplies. Patient was due for a check up and never went to get his prescriptions refilled. Patient reports checking his glucose 3 times a day and that his glucose usually runs in the 200's. Patient stated "he was too sick to take even his Novolog" right before this admission. Explained to patient about gastroparesis and discussed the importance of glucose control. Discussed A1c and glucose goals. Patient feels sick still this am. Patient reports wanting to find a new PCP.  Discharge Medication Assistance: There is a copay card available with his commercial insurance to receive Lantus at a $0 copay  Will follow trends while inpatient  Thanks,  Christena Deem RN, MSN, [facility] Inpatient Diabetes Coordinator Team Pager [phone] (8a-5p)

## 2016-10-04 NOTE — Progress Notes (Signed)
PROGRESS NOTE    Joshua Escobar  ZOX:[id number] DOB: November 30, 1970 DOA: 10/03/2016 PCP: Renaye Rakers, MD     Brief Narrative:  WRANGLER PENNING is a 46 y.o. male with medical history significant for type 2 diabetes on insulin (diagnosed about 3-4 years ago), hypertension and dyslipidemia. Patient presented to the ER after 48 hours of nausea and vomiting. Reports diffuse mild abdominal pain. Patient presented tachycardic with ST elevation in anterior leads and downsloping ST segments with T-wave inversion in the inferior lateral leads. Cardiology was consulted. Bedside echo completed and it was felt patient's abnormal EKG related to LVH/LV strain. Code STEMI was canceled. Patient was admitted for DKA.  Assessment & Plan:   Principal Problem:   DKA, type 2 (HCC) Active Problems:   Acute kidney injury (HCC)   HTN (hypertension)   Nausea & vomiting   HLD (hyperlipidemia)  DKA -Initially denied noncompliance with insulin, however, on further evaluation determined that he was only taking meal coverage NovoLog 3 times daily without Lantus as he ran out 2 months ago -Appreciate diabetic coordinator -Ha1c pending -Lantus 20 units, NovoLog sliding scale  Nausea and vomiting -Continue Zofran, Phenergan as needed  Acute kidney injury -Resolved with IVF   Abnormal EKG -Evaluated by cardiology, underwent bedside echocardiogram without regional wall motion abnormalities as well as normal LV function  Hypertension -Has not been taking lisinopril -Recommend starting low-dose lisinopril for renal protection   Hyperlipidemia -Has not been taking Lipitor -Calculated 10 year ASCVD risk is 12.1%. Recommend high intensity statin    DVT prophylaxis: lovenox Code Status: full Family Communication: no family at bedside Disposition Plan: pending improvement    Consultants:   none  Procedures:   none  Antimicrobials:   none     Subjective: Still feeling ill this morning. Continues to  have nausea. No chest pain.  Objective: Vitals:   10/03/16 2255 10/04/16 0411 10/04/16 0800 10/04/16 0806  BP: (!) 141/98 (!) 128/96 (!) 133/95 (!) 133/95  Pulse: (!) 104 (!) 110 (!) 112 (!) 108  Resp: 13 18 20  (!) 22  Temp: 99.2 F (37.3 C) 99.2 F (37.3 C) 99.3 F (37.4 C)   TempSrc: Oral Oral Oral   SpO2: 99% 100% 99% 99%  Weight:      Height:        Intake/Output Summary (Last 24 hours) at 10/04/16 1140 Last data filed at 10/04/16 0900  Gross per 24 hour  Intake             1760 ml  Output             1300 ml  Net              460 ml   Filed Weights   10/03/16 0631  Weight: 75.8 kg (167 lb)    Examination:  General exam: Appears calm and comfortable  Respiratory system: Clear to auscultation. Respiratory effort normal. Cardiovascular system: S1 & S2 heard, regular rhythm, tachycardic. No JVD, murmurs, rubs, gallops or clicks. No pedal edema. Gastrointestinal system: Abdomen is nondistended, soft and nontender. No organomegaly or masses felt. Normal bowel sounds heard. Central nervous system: Alert and oriented. No focal neurological deficits. Extremities: Symmetric 5 x 5 power. Skin: No rashes, lesions or ulcers Psychiatry: Judgement and insight appear normal. Mood & affect appropriate.   Data Reviewed: I have personally reviewed following labs and imaging studies  CBC:  Recent Labs Lab 10/03/16 0636  WBC 8.8  HGB 17.0  HCT 50.8  MCV 81.9  PLT 248   Basic Metabolic Panel:  Recent Labs Lab 10/03/16 0636 10/03/16 1229 10/03/16 1501 10/03/16 2135 10/04/16 0146  NA 134* 135  --  133* 134*  K 4.6 4.3  --  3.8 3.8  CL 96* 103  --  95* 96*  CO2 15* 18*  --  22 22  GLUCOSE 341* 231*  --  285* 267*  BUN 15 12  --  11 9  CREATININE 1.32* 0.97  --  1.01 0.90  CALCIUM 10.2 9.2  --  9.3 9.3  MG  --   --  1.8  --   --   PHOS  --   --  2.8  --   --    GFR: Estimated Creatinine Clearance: 101.3 mL/min (by C-G formula based on SCr of 0.9 mg/dL). Liver  Function Tests:  Recent Labs Lab 10/03/16 0636  AST 15  ALT 15*  ALKPHOS 87  BILITOT 1.6*  PROT 8.4*  ALBUMIN 4.8    Recent Labs Lab 10/03/16 0636  LIPASE 25   No results for input(s): AMMONIA in the last 168 hours. Coagulation Profile: No results for input(s): INR, PROTIME in the last 168 hours. Cardiac Enzymes: No results for input(s): CKTOTAL, CKMB, CKMBINDEX, TROPONINI in the last 168 hours. BNP (last 3 results) No results for input(s): PROBNP in the last 8760 hours. HbA1C: No results for input(s): HGBA1C in the last 72 hours. CBG:  Recent Labs Lab 10/03/16 1415 10/03/16 1524 10/03/16 1630 10/03/16 1733 10/03/16 2100  GLUCAP 179* 113* 111* 109* 255*   Lipid Profile:  Recent Labs  10/04/16 0146  CHOL 180  HDL 60  LDLCALC 105*  TRIG 73  CHOLHDL 3.0   Thyroid Function Tests: No results for input(s): TSH, T4TOTAL, FREET4, T3FREE, THYROIDAB in the last 72 hours. Anemia Panel: No results for input(s): VITAMINB12, FOLATE, FERRITIN, TIBC, IRON, RETICCTPCT in the last 72 hours. Sepsis Labs:  Recent Labs Lab 10/03/16 0751 10/03/16 1000  LATICACIDVEN 2.73* 2.17*    Recent Results (from the past 240 hour(s))  Blood culture (routine x 2)     Status: None (Preliminary result)   Collection Time: 10/03/16  7:35 AM  Result Value Ref Range Status   Specimen Description BLOOD RIGHT FOREARM  Final   Special Requests   Final    BOTTLES DRAWN AEROBIC AND ANAEROBIC Blood Culture adequate volume   Culture NO GROWTH 1 DAY  Final   Report Status PENDING  Incomplete  Blood culture (routine x 2)     Status: None (Preliminary result)   Collection Time: 10/03/16  7:35 AM  Result Value Ref Range Status   Specimen Description BLOOD LEFT ANTECUBITAL  Final   Special Requests   Final    BOTTLES DRAWN AEROBIC AND ANAEROBIC Blood Culture adequate volume   Culture NO GROWTH 1 DAY  Final   Report Status PENDING  Incomplete  MRSA PCR Screening     Status: None    Collection Time: 10/03/16  4:53 PM  Result Value Ref Range Status   MRSA by PCR NEGATIVE NEGATIVE Final    Comment:        The GeneXpert MRSA Assay (FDA approved for NASAL specimens only), is one component of a comprehensive MRSA colonization surveillance program. It is not intended to diagnose MRSA infection nor to guide or monitor treatment for MRSA infections.        Radiology Studies: Dg Chest Portable 1 View  Result Date: 10/03/2016 CLINICAL DATA:  STEMI, diabetes, current smoker. EXAM: PORTABLE CHEST 1 VIEW COMPARISON:  None in PACs FINDINGS: The lungs are adequately inflated and clear. The heart and pulmonary vascularity are normal. The mediastinum is normal in width. There is no pleural effusion. The bony thorax exhibits no acute abnormality. IMPRESSION: No pneumonia, CHF, nor other acute cardiopulmonary abnormality. Electronically Signed   By: David  [place] M.D.   On: 10/03/2016 09:35      Scheduled Meds: . atorvastatin  40 mg Oral q1800  . enoxaparin (LOVENOX) injection  40 mg Subcutaneous Q24H  . insulin aspart  0-5 Units Subcutaneous QHS  . insulin aspart  0-9 Units Subcutaneous TID WC  . [START ON 10/05/2016] insulin glargine  20 Units Subcutaneous Daily  . lisinopril  5 mg Oral Daily   Continuous Infusions:    LOS: 0 days    Time spent: 40 minutes   Noralee Stain, DO [facility] [email or web address] Password [email or web address] 10/04/2016, 11:40 AM

## 2016-10-04 NOTE — Progress Notes (Signed)
#   4.  S/W SHARON @ [facility] # [phone]   INSULIN :  1. LANTUS  100 UNITS PENS AND VIALS   COVER- PEN AND VIALS -ARE NOT COVER  PRIOR APPROVAL- YES # [phone]   2. LEVEMIR 100 UNITS PENS  COVER- YES  CO-PAY- $ 416.00  PRIOR APPROVAL- NO   3LEVEMIR 100 UNITS VIALS  COVER- YES  CO-PAY- $ 277.51  PRIOR APPROVAL- NO   PHARMACY : [facility]

## 2016-10-05 DIAGNOSIS — Z794 Long term (current) use of insulin: Secondary | ICD-10-CM | POA: Diagnosis not present

## 2016-10-05 DIAGNOSIS — E1110 Type 2 diabetes mellitus with ketoacidosis without coma: Secondary | ICD-10-CM | POA: Diagnosis not present

## 2016-10-05 LAB — BASIC METABOLIC PANEL
Anion gap: 13 (ref 5–15)
BUN: 10 mg/dL (ref 6–20)
CO2: 23 mmol/L (ref 22–32)
Calcium: 9.5 mg/dL (ref 8.9–10.3)
Chloride: 96 mmol/L — ABNORMAL LOW (ref 101–111)
Creatinine, Ser: 0.85 mg/dL (ref 0.61–1.24)
GFR calc Af Amer: >60 mL/min (ref >60)
GFR calc non Af Amer: >60 mL/min (ref >60)
Glucose, Bld: 220 mg/dL — ABNORMAL HIGH (ref 65–99)
Potassium: 3.6 mmol/L (ref 3.5–5.1)
Sodium: 132 mmol/L — ABNORMAL LOW (ref 135–145)

## 2016-10-05 LAB — GLUCOSE, CAPILLARY
Glucose-Capillary: 219 mg/dL — ABNORMAL HIGH (ref 65–99)
Glucose-Capillary: 182 mg/dL — ABNORMAL HIGH (ref 65–99)

## 2016-10-05 LAB — HEMOGLOBIN A1C
Hgb A1c MFr Bld: 14.7 % — ABNORMAL HIGH (ref 4.8–5.6)
Mean Plasma Glucose: 375 mg/dL

## 2016-10-05 MED ORDER — INSULIN GLARGINE 100 UNIT/ML SOLOSTAR PEN: 20.0000 [IU] | PEN_INJECTOR | Freq: Every day | SUBCUTANEOUS | 0 refills | Status: DC

## 2016-10-05 MED ORDER — ACETAMINOPHEN 325 MG PO TABS
650.0000 mg | ORAL_TABLET | Freq: Four times a day (QID) | ORAL | Status: DC | PRN
  Administered 2016-10-05: 650 mg via ORAL
  Filled 2016-10-05: qty 2

## 2016-10-05 MED ORDER — INSULIN ASPART 100 UNIT/ML FLEXPEN: 5.0000 [IU] | PEN_INJECTOR | Freq: Three times a day (TID) | SUBCUTANEOUS | 0 refills | Status: DC

## 2016-10-05 MED ORDER — BLOOD GLUCOSE METER KIT: PACK | 0 refills | Status: DC

## 2016-10-05 MED ORDER — INSULIN GLARGINE 100 UNIT/ML ~~LOC~~ SOLN
20.0000 [IU] | Freq: Every day | SUBCUTANEOUS | Status: DC
  Administered 2016-10-05: 20 [IU] via SUBCUTANEOUS
  Filled 2016-10-05: qty 0.2

## 2016-10-05 MED ORDER — ASPIRIN 81 MG PO TBEC: 81.0000 mg | DELAYED_RELEASE_TABLET | Freq: Every day | ORAL | 0 refills | Status: DC

## 2016-10-05 MED ORDER — INSULIN ASPART 100 UNIT/ML ~~LOC~~ SOLN
3.0000 [IU] | Freq: Three times a day (TID) | SUBCUTANEOUS | Status: DC
  Administered 2016-10-05 (×2): 3 [IU] via SUBCUTANEOUS

## 2016-10-05 MED ORDER — ATORVASTATIN CALCIUM 40 MG PO TABS: 40.0000 mg | ORAL_TABLET | Freq: Every day | ORAL | 0 refills | Status: DC

## 2016-10-05 MED ORDER — INSULIN GLARGINE 100 UNIT/ML ~~LOC~~ SOLN: 25.0000 [IU] | Freq: Every day | SUBCUTANEOUS | Status: DC

## 2016-10-05 MED ORDER — INSULIN PEN NEEDLE 31G X 5 MM MISC: 0 refills | Status: DC

## 2016-10-05 MED ORDER — LISINOPRIL 5 MG PO TABS: 5.0000 mg | ORAL_TABLET | Freq: Every day | ORAL | 0 refills | Status: DC

## 2016-10-05 NOTE — Discharge Summary (Signed)
Physician Discharge Summary  Joshua Escobar [facility]:[id number] DOB: 1971-04-08 DOA: 10/03/2016  PCP: Lucianne Lei, MD  Admit date: 10/03/2016 Discharge date: 10/05/2016  Admitted From: Home Disposition:  Home  Recommendations for Outpatient Follow-up:  1. Follow up with PCP in 1 week 2. Follow up with Ha1c result which is pending at time of discharge   Home Health: No  Equipment/Devices: None   Discharge Condition: Stable CODE STATUS: Full  Diet recommendation: Carb modified   Brief/Interim Summary: Joshua Escobar a 46 y.o.malewith medical history significant for type 2 diabetes on insulin (diagnosed about 3-4 years ago), hypertension and dyslipidemia. Patient presented to the ER after 48 hours of nausea and vomiting. Reports diffuse mild abdominal pain. Patient presented tachycardic with ST elevation in anterior leadsand downsloping ST segments with T-wave inversion in the inferior lateral leads. Cardiology was consulted. Bedside echo completed and it was felt patient's abnormal EKG related to LVH/LV strain. Code STEMI was canceled. Patient was admitted for DKA. He was treated with insulin drip and transitioned to Lantus and NovoLog. Blood sugar continued to improve. He was evaluated by diabetic coordinator as well as case management. They provided with Lantus card copay is $0.  Discharge Diagnoses:  Principal Problem:   DKA, type 2 ([place]) Active Problems:   Acute kidney injury ([place])   HTN (hypertension)   Nausea & vomiting   HLD (hyperlipidemia)  DKA -Initially denied noncompliance with insulin, however, on further evaluation determined that he was only taking meal coverage NovoLog 3 times daily without Lantus as he ran out 2 months ago -Appreciate diabetic coordinator -Ha1c pending -Lantus, Novolog, supplies prescribed at discharge   Nausea and vomiting -Resolved   Acute kidney injury -Resolved with IVF   Abnormal EKG -Evaluated by cardiology, underwent bedside  echocardiogram without regional wall motion abnormalities as well as normal LV function  Hypertension -Has not been taking lisinopril -Recommend starting low-dose lisinopril for renal protection   Hyperlipidemia -Has not been taking Lipitor -Calculated 10 year ASCVD risk is 12.1%. Recommend high intensity statin    Discharge Instructions  Discharge Instructions    Diet Carb Modified    Complete by:  As directed    Increase activity slowly    Complete by:  As directed      Allergies as of 10/05/2016   No Known Allergies     Medication List    STOP taking these medications   nortriptyline 25 MG capsule Commonly known as:  PAMELOR     TAKE these medications   aspirin 81 MG EC tablet Take 1 tablet (81 mg total) by mouth daily.   atorvastatin 40 MG tablet Commonly known as:  LIPITOR Take 1 tablet (40 mg total) by mouth daily. What changed:  medication strength  how much to take  when to take this   blood glucose meter kit and supplies Dispense based on patient and insurance preference. Use up to four times daily as directed. (FOR ICD-9 250.00, 250.01).   insulin aspart 100 UNIT/ML FlexPen Commonly known as:  NOVOLOG FLEXPEN Inject 5 Units into the skin 3 (three) times daily with meals.   Insulin Glargine 100 UNIT/ML Solostar Pen Commonly known as:  LANTUS SOLOSTAR Inject 20 Units into the skin at bedtime. What changed:  how much to take  when to take this   Insulin Pen Needle 31G X 5 MM Misc Use 4 times daily with insulin   lisinopril 5 MG tablet Commonly known as:  PRINIVIL,ZESTRIL Take 1 tablet (5 mg  total) by mouth daily. What changed:  medication strength  how much to take      Follow-up Information    Lucianne Lei, MD. Schedule an appointment as soon as possible for a visit in 1 week(s).   Specialty:  Family Medicine Contact information: [address] [place] [address] [phone]          No Known  Allergies  Consultations:  None   Procedures/Studies: Dg Chest Portable 1 View  Result Date: 10/03/2016 CLINICAL DATA:  STEMI, diabetes, current smoker. EXAM: PORTABLE CHEST 1 VIEW COMPARISON:  None in PACs FINDINGS: The lungs are adequately inflated and clear. The heart and pulmonary vascularity are normal. The mediastinum is normal in width. There is no pleural effusion. The bony thorax exhibits no acute abnormality. IMPRESSION: No pneumonia, CHF, nor other acute cardiopulmonary abnormality. Electronically Signed   By: David  Martinique M.D.   On: 10/03/2016 09:35    Echo Study Conclusions  - Left ventricle: The cavity size was normal. Systolic function was   normal. The estimated ejection fraction was in the range of 55%   to 60%. Wall motion was normal; there were no regional wall   motion abnormalities. Left ventricular diastolic function   parameters were normal. - Atrial septum: No defect or patent foramen ovale was identified.    Discharge Exam: Vitals:   10/05/16 0622 10/05/16 1013  BP: 130/77 132/88  Pulse: 96   Resp: 16   Temp: 99.2 F (37.3 C)    Vitals:   10/04/16 1714 10/04/16 2126 10/05/16 0622 10/05/16 1013  BP: 121/76 120/81 130/77 132/88  Pulse: (!) 109 (!) 113 96   Resp: '18 18 16   ' Temp: 98.5 F (36.9 C) 99.7 F (37.6 C) 99.2 F (37.3 C)   TempSrc:   Oral   SpO2: 98% 97% 97%   Weight: 64.7 kg (142 lb 10.2 oz)     Height: '5\' 8"'  (1.727 m)       General: Pt is alert, awake, not in acute distress Cardiovascular: RRR, S1/S2 +, no rubs, no gallops Respiratory: CTA bilaterally, no wheezing, no rhonchi Abdominal: Soft, NT, ND, bowel sounds + Extremities: no edema, no cyanosis    The results of significant diagnostics from this hospitalization (including imaging, microbiology, ancillary and laboratory) are listed below for reference.     Microbiology: Recent Results (from the past 240 hour(s))  Blood culture (routine x 2)     Status: None  (Preliminary result)   Collection Time: 10/03/16  7:35 AM  Result Value Ref Range Status   Specimen Description BLOOD RIGHT FOREARM  Final   Special Requests   Final    BOTTLES DRAWN AEROBIC AND ANAEROBIC Blood Culture adequate volume   Culture NO GROWTH 2 DAYS  Final   Report Status PENDING  Incomplete  Blood culture (routine x 2)     Status: None (Preliminary result)   Collection Time: 10/03/16  7:35 AM  Result Value Ref Range Status   Specimen Description BLOOD LEFT ANTECUBITAL  Final   Special Requests   Final    BOTTLES DRAWN AEROBIC AND ANAEROBIC Blood Culture adequate volume   Culture NO GROWTH 2 DAYS  Final   Report Status PENDING  Incomplete  MRSA PCR Screening     Status: None   Collection Time: 10/03/16  4:53 PM  Result Value Ref Range Status   MRSA by PCR NEGATIVE NEGATIVE Final    Comment:  The GeneXpert MRSA Assay (FDA approved for NASAL specimens only), is one component of a comprehensive MRSA colonization surveillance program. It is not intended to diagnose MRSA infection nor to guide or monitor treatment for MRSA infections.      Labs: BNP (last 3 results) No results for input(s): BNP in the last 8760 hours. Basic Metabolic Panel:  Recent Labs Lab 10/03/16 0636 10/03/16 1229 10/03/16 1501 10/03/16 2135 10/04/16 0146 10/05/16 0503  NA 134* 135  --  133* 134* 132*  K 4.6 4.3  --  3.8 3.8 3.6  CL 96* 103  --  95* 96* 96*  CO2 15* 18*  --  '22 22 23  ' GLUCOSE 341* 231*  --  285* 267* 220*  BUN 15 12  --  '11 9 10  ' CREATININE 1.32* 0.97  --  1.01 0.90 0.85  CALCIUM 10.2 9.2  --  9.3 9.3 9.5  MG  --   --  1.8  --   --   --   PHOS  --   --  2.8  --   --   --    Liver Function Tests:  Recent Labs Lab 10/03/16 0636  AST 15  ALT 15*  ALKPHOS 87  BILITOT 1.6*  PROT 8.4*  ALBUMIN 4.8    Recent Labs Lab 10/03/16 0636  LIPASE 25   No results for input(s): AMMONIA in the last 168 hours. CBC:  Recent Labs Lab 10/03/16 0636  WBC 8.8   HGB 17.0  HCT 50.8  MCV 81.9  PLT 248   Cardiac Enzymes: No results for input(s): CKTOTAL, CKMB, CKMBINDEX, TROPONINI in the last 168 hours. BNP: Invalid input(s): POCBNP CBG:  Recent Labs Lab 10/04/16 1208 10/04/16 1712 10/04/16 2124 10/05/16 0800 10/05/16 1214  GLUCAP 219* 172* 178* 219* 182*   D-Dimer No results for input(s): DDIMER in the last 72 hours. Hgb A1c No results for input(s): HGBA1C in the last 72 hours. Lipid Profile  Recent Labs  10/04/16 0146  CHOL 180  HDL 60  LDLCALC 105*  TRIG 73  CHOLHDL 3.0   Thyroid function studies No results for input(s): TSH, T4TOTAL, T3FREE, THYROIDAB in the last 72 hours.  Invalid input(s): FREET3 Anemia work up No results for input(s): VITAMINB12, FOLATE, FERRITIN, TIBC, IRON, RETICCTPCT in the last 72 hours. Urinalysis    Component Value Date/Time   COLORURINE YELLOW 10/03/2016 [place] 10/03/2016 0632   LABSPEC 1.028 10/03/2016 0632   PHURINE 5.0 10/03/2016 0632   GLUCOSEU >=500 (A) 10/03/2016 0632   HGBUR NEGATIVE 10/03/2016 0632   BILIRUBINUR NEGATIVE 10/03/2016 0632   KETONESUR 80 (A) 10/03/2016 0632   PROTEINUR NEGATIVE 10/03/2016 0632   UROBILINOGEN 0.2 06/30/2013 2031   NITRITE NEGATIVE 10/03/2016 0632   LEUKOCYTESUR NEGATIVE 10/03/2016 2426   Sepsis Labs Invalid input(s): PROCALCITONIN,  WBC,  LACTICIDVEN Microbiology Recent Results (from the past 240 hour(s))  Blood culture (routine x 2)     Status: None (Preliminary result)   Collection Time: 10/03/16  7:35 AM  Result Value Ref Range Status   Specimen Description BLOOD RIGHT FOREARM  Final   Special Requests   Final    BOTTLES DRAWN AEROBIC AND ANAEROBIC Blood Culture adequate volume   Culture NO GROWTH 2 DAYS  Final   Report Status PENDING  Incomplete  Blood culture (routine x 2)     Status: None (Preliminary result)   Collection Time: 10/03/16  7:35 AM  Result Value Ref Range Status  Specimen Description BLOOD LEFT  ANTECUBITAL  Final   Special Requests   Final    BOTTLES DRAWN AEROBIC AND ANAEROBIC Blood Culture adequate volume   Culture NO GROWTH 2 DAYS  Final   Report Status PENDING  Incomplete  MRSA PCR Screening     Status: None   Collection Time: 10/03/16  4:53 PM  Result Value Ref Range Status   MRSA by PCR NEGATIVE NEGATIVE Final    Comment:        The GeneXpert MRSA Assay (FDA approved for NASAL specimens only), is one component of a comprehensive MRSA colonization surveillance program. It is not intended to diagnose MRSA infection nor to guide or monitor treatment for MRSA infections.      Time coordinating discharge: 45 minutes  SIGNED:  Dessa Phi, DO [facility] Pager [phone]  If 7PM-7AM, please contact night-coverage [email or web address] Password [email or web address] 10/05/2016, 2:24 PM

## 2016-10-05 NOTE — Progress Notes (Signed)
Joshua Escobar to be D/C'd Home per MD order.  Discussed with the patient and all questions fully answered.  VSS, Skin clean, dry and intact without evidence of skin break down, no evidence of skin tears noted. IV catheter discontinued intact. Site without signs and symptoms of complications. Dressing and pressure applied.  An After Visit Summary was printed and given to the patient. Patient received prescription.  D/c education completed with patient/family including follow up instructions, medication list, d/c activities limitations if indicated, with other d/c instructions as indicated by MD - patient able to verbalize understanding, all questions fully answered.   Patient instructed to return to ED, call 911, or call MD for any changes in condition.   Patient escorted via WC, and D/C home via private auto.  Rock Nephew 10/05/2016 4:18 PM

## 2016-10-05 NOTE — Progress Notes (Signed)
CM explained and provided pt with Lantus 0- copay card. Pt verbally stated understanding of card usage. Pt appreciative of card. Gae Gallop RN,BSN,CM

## 2016-10-08 LAB — CULTURE, BLOOD (ROUTINE X 2)
Culture: NO GROWTH
Culture: NO GROWTH
Special Requests: ADEQUATE
Special Requests: ADEQUATE

## 2016-10-31 ENCOUNTER — Encounter: Payer: Self-pay | Admitting: Family

## 2016-10-31 ENCOUNTER — Ambulatory Visit: Payer: BLUE CROSS/BLUE SHIELD | Admitting: Family

## 2016-10-31 ENCOUNTER — Ambulatory Visit (INDEPENDENT_AMBULATORY_CARE_PROVIDER_SITE_OTHER): Payer: BLUE CROSS/BLUE SHIELD | Admitting: Family

## 2016-10-31 VITALS — BP 112/80 | HR 120 | Temp 99.5°F | Resp 16 | Ht 68.0 in | Wt 161.8 lb

## 2016-10-31 DIAGNOSIS — E1110 Type 2 diabetes mellitus with ketoacidosis without coma: Secondary | ICD-10-CM | POA: Diagnosis not present

## 2016-10-31 DIAGNOSIS — Z794 Long term (current) use of insulin: Secondary | ICD-10-CM

## 2016-10-31 MED ORDER — INSULIN GLARGINE 100 UNIT/ML SOLOSTAR PEN: 25.0000 [IU] | PEN_INJECTOR | Freq: Every day | SUBCUTANEOUS | 0 refills | Status: DC

## 2016-10-31 MED ORDER — INSULIN ASPART 100 UNIT/ML FLEXPEN: 7.0000 [IU] | PEN_INJECTOR | Freq: Three times a day (TID) | SUBCUTANEOUS | 0 refills | Status: DC

## 2016-10-31 NOTE — Patient Instructions (Addendum)
Thank you for choosing [facility].  SUMMARY AND INSTRUCTIONS:  Please increase your Lantus to 25 units nightly.   Increase the Novolog to 7 units with each meal.  Continue to monitor your carbohydrate intake.   They will call to schedule a diabetic eye exam.   We will check your A1c is 2 months.   We will forward the office notes to the case manager.  Please follow up with your blood sugars in 1 week via phone/MyChart for medication adjustment.   Please check on the price of the following medications:  Farxiga, Jardiance, Invokana, Trulicity, and Bydureon  Can also check on the price of Humalog.   Medication:  Your prescription(s) have been submitted to your pharmacy or been printed and provided for you. Please take as directed and contact our office if you believe you are having problem(s) with the medication(s) or have any questions.    Follow up:  If your symptoms worsen or fail to improve, please contact our office for further instruction, or in case of emergency go directly to the emergency room at the closest medical facility.    DIABETES CARE INSTRUCTIONS:  Current A1c:  Lab Results  Component Value Date   HGBA1C 14.7 (H) 10/04/2016    A1C Goal: <7.0%  Fasting Blood Sugar Goal: 80-130 Blood sugar check that you take after fasting for at least 8 hours which is usually in the morning before eating.  Post-prandial Blood Sugar Goal: <180 Blood sugar check approximately 1-2 hours after the start of a meal.    Diabetes Prevention Screens:  1.) Ensure you have an annual eye exam by an ophthalmologist or optometrist.   2,) Ensure you have an annual foot exam or when any changes are noted including new onset numbness/tingling or wounds. Check your feet daily!  3.) The American Diabetes Association recommends the Pneumovax vaccination against pneumonia every 5 years.  4.) We will check your kidney function with a urine test on an annual basis.

## 2016-10-31 NOTE — Progress Notes (Signed)
Subjective:    Patient ID: Joshua Escobar, male    DOB: [birth year], 46 y.o.   MRN: [id number]  Chief Complaint  Patient presents with  . Establish Care    hospital follow up, needs FMLA for work has been out of work since may 8th     HPI:  Joshua Escobar is a 46 y.o. male who  has a past medical history of Migraine headache and Type II diabetes mellitus ([place]). and presents today for an office visit to establish care.   1.) Type 2 diabetes - Currently maintained on Lantus (20 units) and Novolog (5 units) daily. Reports taking the medications as prescribed and denies adverse side effects or hypoglycemic readings. No excessive hunger, thirst or urination. No recent eye exam. Working on a low carbohydrate diet. Currently check his blood sugars 4x per day and it remains elevated in the 200's. He notes that he has not been back to work secondary to needing to see a primary care provider Following a recent hospitalization where he was noted to have a hemoglobin A1c of 14.7 and poorly controlled diabetes with poor compliance with medication regimen.  Lab Results  Component Value Date   HGBA1C 14.7 (H) 10/04/2016      No Known Allergies    Outpatient Medications Prior to Visit  Medication Sig Dispense Refill  . aspirin 81 MG EC tablet Take 1 tablet (81 mg total) by mouth daily. 30 tablet 0  . atorvastatin (LIPITOR) 40 MG tablet Take 1 tablet (40 mg total) by mouth daily. 30 tablet 0  . blood glucose meter kit and supplies Dispense based on patient and insurance preference. Use up to four times daily as directed. (FOR ICD-9 250.00, 250.01). 1 each 0  . Insulin Pen Needle 31G X 5 MM MISC Use 4 times daily with insulin 120 each 0  . lisinopril (PRINIVIL,ZESTRIL) 5 MG tablet Take 1 tablet (5 mg total) by mouth daily. 30 tablet 0  . insulin aspart (NOVOLOG FLEXPEN) 100 UNIT/ML FlexPen Inject 5 Units into the skin 3 (three) times daily with meals. 4.5 mL 0  . Insulin Glargine (LANTUS  SOLOSTAR) 100 UNIT/ML Solostar Pen Inject 20 Units into the skin at bedtime. 6 mL 0   No facility-administered medications prior to visit.      Past Medical History:  Diagnosis Date  . Migraine headache   . Type II diabetes mellitus ([place])       Past Surgical History:  Procedure Laterality Date  . NO PAST SURGERIES        Family History  Problem Relation Age of Onset  . Diabetes Mellitus II Sister   . Diabetes Mellitus II Brother   . Diabetes Mellitus II Mother   . Diabetes Mellitus II Father       Social History   Social History  . Marital status: Divorced    Spouse name: N/A  . Number of children: 2  . Years of education: 12   Occupational History  . Engineer, civil (consulting)    Social History Main Topics  . Smoking status: Current Every Day Smoker    Packs/day: 0.50    Years: 20.00    Types: Cigarettes  . Smokeless tobacco: Never Used  . Alcohol use 3.6 oz/week    6 Cans of beer per week  . Drug use: No  . Sexual activity: Not Currently   Other Topics Concern  . Not on file   Social History Narrative   Fun/Hobby: Basketball, fish,  bowling.       Review of Systems  Eyes:       Negative for changes in vision.  Respiratory: Negative for chest tightness and shortness of breath.   Cardiovascular: Negative for chest pain, palpitations and leg swelling.  Endocrine: Negative for polydipsia, polyphagia and polyuria.  Neurological: Negative for dizziness, weakness, light-headedness and headaches.       Objective:    BP 112/80 (BP Location: Left Arm, Patient Position: Sitting, Cuff Size: Normal)   Pulse (!) 120   Temp 99.5 F (37.5 C) (Oral)   Resp 16   Ht _0  (1.727 m)   Wt 161 lb 12.8 oz (73.4 kg)   SpO2 97%   BMI 24.60 kg/m  Nursing note and vital signs reviewed.  Physical Exam  Constitutional: He is oriented to person, place, and time. He appears well-developed and well-nourished. No distress.  Cardiovascular: Normal rate, regular rhythm,  normal heart sounds and intact distal pulses.   Pulmonary/Chest: Effort normal and breath sounds normal.  Neurological: He is alert and oriented to person, place, and time.  Diabetic foot exam - bilateral feet are free from skin breakdown, cuts, and abrasions. Toenails are neatly trimmed. Pulses are intact and appropriate. Sensation is intact to monofilament bilaterally.  Skin: Skin is warm and dry.  Psychiatric: He has a normal mood and affect. His behavior is normal. Judgment and thought content normal.        Assessment & Plan:   Problem List Items Addressed This Visit      Endocrine   DM (diabetes mellitus) type 2, uncontrolled, with ketoacidosis ([place]) - Primary    Poorly controlled type 2 diabetes with most recent A1c of 14.7 approximately one month ago. Blood sugars at home appear to be improving although remain elevated. Increase Lantus to 25 units nightly and NovoLog to 7 units 3 times daily with meals. Continue to monitor blood sugars 3-4 times per day. Follow-up in one week with blood sugar numbers for medication adjustment. Diabetic foot exam completed today. Refer to ophthalmology for diabetic eye exam. Pneumovax is up-to-date. Maintained on atorvastatin and lisinopril for CAD risk reduction.  Patient presented short-term disability papers during office visit. He has been out of work for approximately 3 weeks with the instructions of inability to return to work without a medical note. During his hospitalization he was advised to follow-up with a primary care provider which he indicates was his brothers provider who he did not wish to see. He did call our office within the nearest appointment available for new patient being today. Patient advised cannot justify whereabouts or need for being out of work for 3 weeks as medical relationship was performed today cannot account for previous weeks out of work.      Relevant Medications   insulin aspart (NOVOLOG FLEXPEN) 100 UNIT/ML FlexPen     Insulin Glargine (LANTUS SOLOSTAR) 100 UNIT/ML Solostar Pen       I have changed Mr. Depuy insulin aspart and Insulin Glargine. I am also having him maintain his aspirin, atorvastatin, lisinopril, Insulin Pen Needle, and blood glucose meter kit and supplies.  A total of 30  minutes were spent face-to-face with the patient during this encounter and over half of that time was spent on counseling and coordination of care.  We discussed in depth the importance of blood sugar management to prevent end organ damage in the future, diabetic preventative care and possibility of short term disability.    Follow-up: Return in about  2 months (around 12/31/2016), or if symptoms worsen or fail to improve.  Mauricio Po, FNP

## 2016-10-31 NOTE — Assessment & Plan Note (Signed)
Poorly controlled type 2 diabetes with most recent A1c of 14.7 approximately one month ago. Blood sugars at home appear to be improving although remain elevated. Increase Lantus to 25 units nightly and NovoLog to 7 units 3 times daily with meals. Continue to monitor blood sugars 3-4 times per day. Follow-up in one week with blood sugar numbers for medication adjustment. Diabetic foot exam completed today. Refer to ophthalmology for diabetic eye exam. Pneumovax is up-to-date. Maintained on atorvastatin and lisinopril for CAD risk reduction.  Patient presented short-term disability papers during office visit. He has been out of work for approximately 3 weeks with the instructions of inability to return to work without a medical note. During his hospitalization he was advised to follow-up with a primary care provider which he indicates was his brothers provider who he did not wish to see. He did call our office within the nearest appointment available for new patient being today. Patient advised cannot justify whereabouts or need for being out of work for 3 weeks as medical relationship was performed today cannot account for previous weeks out of work.

## 2016-11-20 ENCOUNTER — Encounter (HOSPITAL_COMMUNITY): Payer: Self-pay

## 2016-11-20 ENCOUNTER — Emergency Department (HOSPITAL_COMMUNITY): Payer: BLUE CROSS/BLUE SHIELD

## 2016-11-20 DIAGNOSIS — R079 Chest pain, unspecified: Secondary | ICD-10-CM | POA: Diagnosis not present

## 2016-11-20 DIAGNOSIS — Z794 Long term (current) use of insulin: Secondary | ICD-10-CM | POA: Insufficient documentation

## 2016-11-20 DIAGNOSIS — F17210 Nicotine dependence, cigarettes, uncomplicated: Secondary | ICD-10-CM | POA: Diagnosis not present

## 2016-11-20 DIAGNOSIS — E119 Type 2 diabetes mellitus without complications: Secondary | ICD-10-CM | POA: Diagnosis not present

## 2016-11-20 DIAGNOSIS — I10 Essential (primary) hypertension: Secondary | ICD-10-CM | POA: Diagnosis not present

## 2016-11-20 DIAGNOSIS — R1110 Vomiting, unspecified: Secondary | ICD-10-CM | POA: Diagnosis not present

## 2016-11-20 DIAGNOSIS — Z79899 Other long term (current) drug therapy: Secondary | ICD-10-CM | POA: Diagnosis not present

## 2016-11-20 DIAGNOSIS — R1010 Upper abdominal pain, unspecified: Secondary | ICD-10-CM | POA: Diagnosis not present

## 2016-11-20 DIAGNOSIS — R112 Nausea with vomiting, unspecified: Secondary | ICD-10-CM | POA: Diagnosis not present

## 2016-11-20 DIAGNOSIS — R109 Unspecified abdominal pain: Secondary | ICD-10-CM | POA: Diagnosis not present

## 2016-11-20 LAB — CBC
HCT: 45.1 % (ref 39.0–52.0)
Hemoglobin: 15.1 g/dL (ref 13.0–17.0)
MCH: 27.3 pg (ref 26.0–34.0)
MCHC: 33.5 g/dL (ref 30.0–36.0)
MCV: 81.6 fL (ref 78.0–100.0)
Platelets: 238 10*3/uL (ref 150–400)
RBC: 5.53 MIL/uL (ref 4.22–5.81)
RDW: 12.8 % (ref 11.5–15.5)
WBC: 5.5 10*3/uL (ref 4.0–10.5)

## 2016-11-20 LAB — BASIC METABOLIC PANEL
Anion gap: 8 (ref 5–15)
BUN: 10 mg/dL (ref 6–20)
CO2: 28 mmol/L (ref 22–32)
Calcium: 9.7 mg/dL (ref 8.9–10.3)
Chloride: 98 mmol/L — ABNORMAL LOW (ref 101–111)
Creatinine, Ser: 0.9 mg/dL (ref 0.61–1.24)
GFR calc Af Amer: >60 mL/min (ref >60)
GFR calc non Af Amer: >60 mL/min (ref >60)
Glucose, Bld: 201 mg/dL — ABNORMAL HIGH (ref 65–99)
Potassium: 3.8 mmol/L (ref 3.5–5.1)
Sodium: 134 mmol/L — ABNORMAL LOW (ref 135–145)

## 2016-11-20 LAB — I-STAT TROPONIN, ED: Troponin i, poc: 0.01 ng/mL (ref 0.00–0.08)

## 2016-11-20 NOTE — ED Triage Notes (Signed)
Pt reports non-radiating left sided sharp chest pain that started 2 days ago associated with n/v and dizziness, denies SHOB.

## 2016-11-21 ENCOUNTER — Other Ambulatory Visit: Payer: Self-pay

## 2016-11-21 ENCOUNTER — Emergency Department (HOSPITAL_COMMUNITY)
Admission: EM | Admit: 2016-11-21 | Discharge: 2016-11-21 | Disposition: A | Discharge status: Home / Self Care | Payer: BLUE CROSS/BLUE SHIELD | Attending: Emergency Medicine | Admitting: Emergency Medicine

## 2016-11-21 ENCOUNTER — Emergency Department (HOSPITAL_COMMUNITY): Payer: BLUE CROSS/BLUE SHIELD

## 2016-11-21 DIAGNOSIS — R1010 Upper abdominal pain, unspecified: Secondary | ICD-10-CM

## 2016-11-21 DIAGNOSIS — R109 Unspecified abdominal pain: Secondary | ICD-10-CM | POA: Diagnosis not present

## 2016-11-21 DIAGNOSIS — R1110 Vomiting, unspecified: Secondary | ICD-10-CM | POA: Diagnosis not present

## 2016-11-21 DIAGNOSIS — R112 Nausea with vomiting, unspecified: Secondary | ICD-10-CM | POA: Diagnosis not present

## 2016-11-21 LAB — HEPATIC FUNCTION PANEL
ALT: 16 U/L — ABNORMAL LOW (ref 17–63)
AST: 21 U/L (ref 15–41)
Albumin: 4.6 g/dL (ref 3.5–5.0)
Alkaline Phosphatase: 61 U/L (ref 38–126)
Bilirubin, Direct: 0.2 mg/dL (ref 0.1–0.5)
Indirect Bilirubin: 1.1 mg/dL — ABNORMAL HIGH (ref 0.3–0.9)
Total Bilirubin: 1.3 mg/dL — ABNORMAL HIGH (ref 0.3–1.2)
Total Protein: 7.4 g/dL (ref 6.5–8.1)

## 2016-11-21 LAB — LIPASE, BLOOD: Lipase: 23 U/L (ref 11–51)

## 2016-11-21 MED ORDER — IOPAMIDOL (ISOVUE-300) INJECTION 61%
INTRAVENOUS | Status: AC
  Administered 2016-11-21: 100 mL
  Filled 2016-11-21: qty 100

## 2016-11-21 MED ORDER — MORPHINE SULFATE (PF) 4 MG/ML IV SOLN
6.0000 mg | Freq: Once | INTRAVENOUS | Status: AC
  Administered 2016-11-21: 6 mg via INTRAVENOUS
  Filled 2016-11-21: qty 2

## 2016-11-21 MED ORDER — OMEPRAZOLE 20 MG PO CPDR: 20.0000 mg | DELAYED_RELEASE_CAPSULE | Freq: Every day | ORAL | 0 refills | Status: DC

## 2016-11-21 MED ORDER — ONDANSETRON 8 MG PO TBDP: 8.0000 mg | ORAL_TABLET | Freq: Three times a day (TID) | ORAL | 0 refills | Status: DC | PRN

## 2016-11-21 NOTE — ED Notes (Signed)
Pt verbalized understanding of d/c instructions and has no further questions. Pt is stable, A&Ox4, VSS.  

## 2016-11-21 NOTE — ED Provider Notes (Signed)
[facility] DEPT Provider Note   CSN: [id number] Arrival date & time: 11/20/16  2136     History   Chief Complaint Chief Complaint  Patient presents with  . Chest Pain    HPI Joshua Escobar is a 46 y.o. male.  HPI Patient presents to the emergency department with complaints of upper abdominal pain with associated nausea vomiting and lightheadedness over the past 48 hours.  No shortness of breath.  No blood noted in his vomit.  Denies diarrhea.  Reports upper abdominal cramping.  Reports radiation of his discomfort from his upper abdomen up into his chest.  Denies shortness of breath.  No history of cardiac disease.  No urinary complaints.  No low back pain.  Denies active chest pain at this time   Past Medical History:  Diagnosis Date  . Migraine headache   . Type II diabetes mellitus [facility])     Patient Active Problem List   Diagnosis Date Noted  . DKA, type 2 ([place]) 10/03/2016  . Acute kidney injury ([place]) 10/03/2016  . HTN (hypertension) 10/03/2016  . Nausea & vomiting 10/03/2016  . HLD (hyperlipidemia) 10/03/2016  . Demand ischemia ([place])   . AKI (acute kidney injury) ([place])   . DM (diabetes mellitus) type 2, uncontrolled, with ketoacidosis ([place]) 07/04/2013    Past Surgical History:  Procedure Laterality Date  . NO PAST SURGERIES         Home Medications    Prior to Admission medications   Medication Sig Start Date End Date Taking? Authorizing Provider  blood glucose meter kit and supplies Dispense based on patient and insurance preference. Use up to four times daily as directed. (FOR ICD-9 250.00, 250.01). 10/05/16  Yes Dessa Phi Chahn-Yang, DO  insulin aspart (NOVOLOG FLEXPEN) 100 UNIT/ML FlexPen Inject 7 Units into the skin 3 (three) times daily with meals. 10/31/16 11/30/16 Yes Golden Circle, FNP  Insulin Glargine (LANTUS SOLOSTAR) 100 UNIT/ML Solostar Pen Inject 25 Units into the skin at bedtime. Patient taking differently: Inject 24 Units into the  skin at bedtime.  10/31/16 11/30/16 Yes Golden Circle, FNP  Insulin Pen Needle 31G X 5 MM MISC Use 4 times daily with insulin 10/05/16  Yes Dessa Phi Chahn-Yang, DO  Multiple Vitamin (MULTIVITAMIN WITH MINERALS) TABS tablet Take 1 tablet by mouth daily.   Yes [provider]  omeprazole (PRILOSEC) 20 MG capsule Take 1 capsule (20 mg total) by mouth daily. 11/21/16   Jola Schmidt, MD  ondansetron (ZOFRAN ODT) 8 MG disintegrating tablet Take 1 tablet (8 mg total) by mouth every 8 (eight) hours as needed for nausea or vomiting. 11/21/16   Jola Schmidt, MD    Family History Family History  Problem Relation Age of Onset  . Diabetes Mellitus II Sister   . Diabetes Mellitus II Brother   . Diabetes Mellitus II Mother   . Diabetes Mellitus II Father     Social History Social History  Substance Use Topics  . Smoking status: Current Every Day Smoker    Packs/day: 0.50    Years: 20.00    Types: Cigarettes  . Smokeless tobacco: Never Used  . Alcohol use 3.6 oz/week    6 Cans of beer per week     Allergies   Patient has no known allergies.   Review of Systems Review of Systems  All other systems reviewed and are negative.    Physical Exam Updated Vital Signs BP 130/89 (BP Location: Right Arm)   Pulse (!) 109  Temp 98.9 F (37.2 C) (Oral)   Resp 18   SpO2 99%   Physical Exam  Constitutional: He is oriented to person, place, and time. He appears well-developed and well-nourished.  HENT:  Head: Normocephalic and atraumatic.  Eyes: EOM are normal.  Neck: Normal range of motion.  Cardiovascular: Normal rate, regular rhythm and normal heart sounds.   Pulmonary/Chest: Effort normal and breath sounds normal. No respiratory distress.  Abdominal: Soft. He exhibits no distension. There is no tenderness.  Musculoskeletal: Normal range of motion.  Neurological: He is alert and oriented to person, place, and time.  Skin: Skin is warm and dry.  Psychiatric: He has a  normal mood and affect. Judgment normal.  Nursing note and vitals reviewed.    ED Treatments / Results  Labs (all labs ordered are listed, but only abnormal results are displayed) Labs Reviewed  BASIC METABOLIC PANEL - Abnormal; Notable for the following:       Result Value   Sodium 134 (*)    Chloride 98 (*)    Glucose, Bld 201 (*)    All other components within normal limits  CBC  HEPATIC FUNCTION PANEL  LIPASE, BLOOD  I-STAT TROPOININ, ED    EKG  EKG Interpretation None       Radiology Dg Chest 2 View  Result Date: 11/20/2016 CLINICAL DATA:  Left chest pain for 2 days. EXAM: CHEST  2 VIEW COMPARISON:  10/03/2016 FINDINGS: The cardiomediastinal silhouette is unremarkable. There is no evidence of focal airspace disease, pulmonary edema, suspicious pulmonary nodule/mass, pleural effusion, or pneumothorax. No acute bony abnormalities are identified. IMPRESSION: No active cardiopulmonary disease. Electronically Signed   By: Margarette Canada M.D.   On: 11/20/2016 22:01   Ct Abdomen Pelvis W Contrast  Result Date: 11/21/2016 CLINICAL DATA:  Upper abdominal pain, nausea and vomiting. EXAM: CT ABDOMEN AND PELVIS WITH CONTRAST TECHNIQUE: Multidetector CT imaging of the abdomen and pelvis was performed using the standard protocol following bolus administration of intravenous contrast. CONTRAST:  178m ISOVUE-300 IOPAMIDOL (ISOVUE-300) INJECTION 61% COMPARISON:  None FINDINGS: Lower chest: No acute abnormality. Hepatobiliary: No focal liver abnormality is seen. No gallstones, gallbladder wall thickening, or biliary dilatation. Pancreas: Unremarkable. No pancreatic ductal dilatation or surrounding inflammatory changes. Spleen: Normal in size without focal abnormality. Adrenals/Urinary Tract: Adrenal glands are unremarkable. Kidneys are normal, without renal calculi, focal lesion, or hydronephrosis. Bladder is unremarkable. Stomach/Bowel: Stomach is within normal limits. Appendix is normal. No  evidence of bowel wall thickening, distention, or inflammatory changes. Vascular/Lymphatic: Mild aortic atherosclerosis. No aneurysm or dissection. Reproductive: Unremarkable Other: No focal inflammation. No ascites. Low midline anterior abdominal wall subcutaneous 1.5 cm lesion, probably an incidental sebaceous cyst. Musculoskeletal: No significant skeletal lesion. IMPRESSION: Mild aortic atherosclerosis. No acute findings in the abdomen or pelvis. Electronically Signed   By: DAndreas NewportM.D.   On: 11/21/2016 06:00    Procedures Procedures (including critical care time)  Medications Ordered in ED Medications  morphine 4 MG/ML injection 6 mg (6 mg Intravenous Given 11/21/16 0459)  iopamidol (ISOVUE-300) 61 % injection (100 mLs  Contrast Given 11/21/16 0528)     Initial Impression / Assessment and Plan / ED Course  I have reviewed the triage vital signs and the nursing notes.  Pertinent labs & imaging results that were available during my care of the patient were reviewed by me and considered in my medical decision making (see chart for details).     Patient feels better this time.  Doubt ACS.  No evidence of acute pathology found on CT abdomen pelvis.  Patient will need outpatient GI follow-up.  He may benefit from endoscopy for evaluation for gastritis versus peptic ulcer disease.  Home on Prilosec.  Home and nausea medication.  No hematemesis described.  Vital signs stable.  Patient understands to return to the ER for new or worsening symptoms  Final Clinical Impressions(s) / ED Diagnoses   Final diagnoses:  Upper abdominal pain    New Prescriptions New Prescriptions   OMEPRAZOLE (PRILOSEC) 20 MG CAPSULE    Take 1 capsule (20 mg total) by mouth daily.   ONDANSETRON (ZOFRAN ODT) 8 MG DISINTEGRATING TABLET    Take 1 tablet (8 mg total) by mouth every 8 (eight) hours as needed for nausea or vomiting.     Jola Schmidt, MD 11/21/16 [phone]

## 2016-11-25 ENCOUNTER — Encounter (HOSPITAL_COMMUNITY): Payer: Self-pay | Admitting: Emergency Medicine

## 2016-11-25 ENCOUNTER — Other Ambulatory Visit: Payer: Self-pay

## 2016-11-25 ENCOUNTER — Inpatient Hospital Stay (HOSPITAL_COMMUNITY)
Admission: EM | Admit: 2016-11-25 | Discharge: 2016-11-27 | DRG: 287 | Disposition: A | Discharge status: Home / Self Care | Payer: BLUE CROSS/BLUE SHIELD | Attending: Cardiovascular Disease | Admitting: Cardiovascular Disease

## 2016-11-25 ENCOUNTER — Emergency Department (HOSPITAL_COMMUNITY): Payer: BLUE CROSS/BLUE SHIELD

## 2016-11-25 DIAGNOSIS — I10 Essential (primary) hypertension: Secondary | ICD-10-CM | POA: Diagnosis present

## 2016-11-25 DIAGNOSIS — Z79899 Other long term (current) drug therapy: Secondary | ICD-10-CM | POA: Diagnosis not present

## 2016-11-25 DIAGNOSIS — Z7982 Long term (current) use of aspirin: Secondary | ICD-10-CM

## 2016-11-25 DIAGNOSIS — Z794 Long term (current) use of insulin: Secondary | ICD-10-CM | POA: Diagnosis not present

## 2016-11-25 DIAGNOSIS — R9431 Abnormal electrocardiogram [ECG] [EKG]: Secondary | ICD-10-CM | POA: Diagnosis not present

## 2016-11-25 DIAGNOSIS — R0789 Other chest pain: Secondary | ICD-10-CM | POA: Diagnosis present

## 2016-11-25 DIAGNOSIS — E1165 Type 2 diabetes mellitus with hyperglycemia: Secondary | ICD-10-CM

## 2016-11-25 DIAGNOSIS — F17210 Nicotine dependence, cigarettes, uncomplicated: Secondary | ICD-10-CM | POA: Diagnosis present

## 2016-11-25 DIAGNOSIS — K219 Gastro-esophageal reflux disease without esophagitis: Secondary | ICD-10-CM | POA: Diagnosis present

## 2016-11-25 DIAGNOSIS — I200 Unstable angina: Principal | ICD-10-CM | POA: Diagnosis present

## 2016-11-25 DIAGNOSIS — I201 Angina pectoris with documented spasm: Secondary | ICD-10-CM | POA: Diagnosis not present

## 2016-11-25 DIAGNOSIS — R079 Chest pain, unspecified: Secondary | ICD-10-CM

## 2016-11-25 DIAGNOSIS — R000 Tachycardia, unspecified: Secondary | ICD-10-CM | POA: Diagnosis not present

## 2016-11-25 DIAGNOSIS — R072 Precordial pain: Secondary | ICD-10-CM | POA: Diagnosis not present

## 2016-11-25 LAB — BASIC METABOLIC PANEL
Anion gap: 9 (ref 5–15)
BUN: 11 mg/dL (ref 6–20)
CO2: 22 mmol/L (ref 22–32)
Calcium: 9.7 mg/dL (ref 8.9–10.3)
Chloride: 100 mmol/L — ABNORMAL LOW (ref 101–111)
Creatinine, Ser: 0.87 mg/dL (ref 0.61–1.24)
GFR calc Af Amer: >60 mL/min (ref >60)
GFR calc non Af Amer: >60 mL/min (ref >60)
Glucose, Bld: 280 mg/dL — ABNORMAL HIGH (ref 65–99)
Potassium: 4 mmol/L (ref 3.5–5.1)
Sodium: 131 mmol/L — ABNORMAL LOW (ref 135–145)

## 2016-11-25 LAB — CBC
HCT: 47.8 % (ref 39.0–52.0)
Hemoglobin: 15.7 g/dL (ref 13.0–17.0)
MCH: 26.6 pg (ref 26.0–34.0)
MCHC: 32.8 g/dL (ref 30.0–36.0)
MCV: 80.9 fL (ref 78.0–100.0)
Platelets: 264 10*3/uL (ref 150–400)
RBC: 5.91 MIL/uL — ABNORMAL HIGH (ref 4.22–5.81)
RDW: 12.8 % (ref 11.5–15.5)
WBC: 5.2 10*3/uL (ref 4.0–10.5)

## 2016-11-25 LAB — I-STAT TROPONIN, ED
Troponin i, poc: 0 ng/mL (ref 0.00–0.08)
Troponin i, poc: 0 ng/mL (ref 0.00–0.08)

## 2016-11-25 LAB — BRAIN NATRIURETIC PEPTIDE: B Natriuretic Peptide: 4.8 pg/mL (ref 0.0–100.0)

## 2016-11-25 LAB — D-DIMER, QUANTITATIVE: D-Dimer, Quant: <0.27 ug/mL-FEU (ref 0.00–0.50)

## 2016-11-25 LAB — TROPONIN I: Troponin I: <0.03 ng/mL (ref <0.03)

## 2016-11-25 LAB — CBG MONITORING, ED: Glucose-Capillary: 227 mg/dL — ABNORMAL HIGH (ref 65–99)

## 2016-11-25 LAB — TSH: TSH: 1.225 u[IU]/mL (ref 0.350–4.500)

## 2016-11-25 MED ORDER — INSULIN ASPART 100 UNIT/ML ~~LOC~~ SOLN
0.0000 [IU] | Freq: Every day | SUBCUTANEOUS | Status: DC
  Administered 2016-11-25: 3 [IU] via SUBCUTANEOUS
  Filled 2016-11-25: qty 1

## 2016-11-25 MED ORDER — HEPARIN (PORCINE) IN NACL 100-0.45 UNIT/ML-% IJ SOLN
1150.0000 [IU]/h | INTRAMUSCULAR | Status: DC
  Administered 2016-11-25: 1000 [IU]/h via INTRAVENOUS
  Administered 2016-11-26 – 2016-11-27 (×2): 1150 [IU]/h via INTRAVENOUS
  Filled 2016-11-25 (×3): qty 250

## 2016-11-25 MED ORDER — HEPARIN BOLUS VIA INFUSION
4000.0000 [IU] | Freq: Once | INTRAVENOUS | Status: AC
  Administered 2016-11-25: 4000 [IU] via INTRAVENOUS
  Filled 2016-11-25: qty 4000

## 2016-11-25 MED ORDER — INSULIN ASPART 100 UNIT/ML ~~LOC~~ SOLN
0.0000 [IU] | Freq: Three times a day (TID) | SUBCUTANEOUS | Status: DC
  Administered 2016-11-26: 5 [IU] via SUBCUTANEOUS
  Administered 2016-11-26: 2 [IU] via SUBCUTANEOUS
  Administered 2016-11-27: 3 [IU] via SUBCUTANEOUS

## 2016-11-25 MED ORDER — INSULIN GLARGINE 100 UNIT/ML ~~LOC~~ SOLN
24.0000 [IU] | Freq: Every day | SUBCUTANEOUS | Status: DC
  Administered 2016-11-26: 24 [IU] via SUBCUTANEOUS
  Administered 2016-11-26: 12 [IU] via SUBCUTANEOUS
  Filled 2016-11-25 (×4): qty 0.24

## 2016-11-25 MED ORDER — NITROGLYCERIN 0.4 MG SL SUBL
0.4000 mg | SUBLINGUAL_TABLET | SUBLINGUAL | Status: AC | PRN
  Administered 2016-11-25 (×3): 0.4 mg via SUBLINGUAL
  Filled 2016-11-25 (×3): qty 1

## 2016-11-25 MED ORDER — ASPIRIN 325 MG PO TABS
325.0000 mg | ORAL_TABLET | Freq: Every day | ORAL | Status: DC
  Administered 2016-11-25: 325 mg via ORAL
  Filled 2016-11-25: qty 1

## 2016-11-25 NOTE — ED Provider Notes (Signed)
[place] DEPT Provider Note   CSN: [id number] Arrival date & time: 11/25/16  1626     History   Chief Complaint Chief Complaint  Patient presents with  . Chest Pain    HPI Joshua Escobar is a 46 y.o. male.  HPI  46 year old male with past medical history significant for DM, tobacco use, who presents to the emergency department for chest pain. States that he was taking a nap and woke up from sleep with severe substernal chest pain. Describes as a squeezing and pressure sensation, constant, nonradiating. Associated with diaphoresis. Nothing improves or worsens the pain. Has not taken anything for the pain. Denies nausea, vomiting, shortness of breath, syncope. States for the past 4 days he has also been having some epigastric and lower abdominal pain that is intermittent. Not currently with abdominal pain at this time. Had 4 days of emesis but has not thrown up today. No hematemesis, no melena. Denies fever, chills, dysuria.    Past Medical History:  Diagnosis Date  . Migraine headache   . Type II diabetes mellitus [facility])     Patient Active Problem List   Diagnosis Date Noted  . Unstable angina ([place]) 11/25/2016  . Abnormal ECG   . DKA, type 2 ([place]) 10/03/2016  . Acute kidney injury ([place]) 10/03/2016  . HTN (hypertension) 10/03/2016  . Nausea & vomiting 10/03/2016  . HLD (hyperlipidemia) 10/03/2016  . Demand ischemia ([place])   . AKI (acute kidney injury) ([place])   . Uncontrolled type 2 diabetes mellitus without complication, with long-term current use of insulin ([place]) 07/04/2013    Past Surgical History:  Procedure Laterality Date  . NO PAST SURGERIES         Home Medications    Prior to Admission medications   Medication Sig Start Date End Date Taking? Authorizing Provider  blood glucose meter kit and supplies Dispense based on patient and insurance preference. Use up to four times daily as directed. (FOR ICD-9 250.00, 250.01). 10/05/16  Yes Dessa Phi  Chahn-Yang, DO  insulin aspart (NOVOLOG FLEXPEN) 100 UNIT/ML FlexPen Inject 7 Units into the skin 3 (three) times daily with meals. 10/31/16 11/30/16 Yes Golden Circle, FNP  Insulin Glargine (LANTUS SOLOSTAR) 100 UNIT/ML Solostar Pen Inject 25 Units into the skin at bedtime. Patient taking differently: Inject 24 Units into the skin at bedtime.  10/31/16 11/30/16 Yes Golden Circle, FNP  Insulin Pen Needle 31G X 5 MM MISC Use 4 times daily with insulin 10/05/16  Yes Dessa Phi Chahn-Yang, DO  Multiple Vitamin (MULTIVITAMIN WITH MINERALS) TABS tablet Take 1 tablet by mouth daily.   Yes [provider]  omeprazole (PRILOSEC) 20 MG capsule Take 1 capsule (20 mg total) by mouth daily. 11/21/16  Yes Jola Schmidt, MD  ondansetron (ZOFRAN ODT) 8 MG disintegrating tablet Take 1 tablet (8 mg total) by mouth every 8 (eight) hours as needed for nausea or vomiting. 11/21/16  Yes Jola Schmidt, MD    Family History Family History  Problem Relation Age of Onset  . Diabetes Mellitus II Sister   . Diabetes Mellitus II Brother   . Diabetes Mellitus II Mother   . Diabetes Mellitus II Father     Social History Social History  Substance Use Topics  . Smoking status: Current Every Day Smoker    Packs/day: 0.50    Years: 20.00    Types: Cigarettes  . Smokeless tobacco: Never Used  . Alcohol use 3.6 oz/week    6 Cans of beer  per week     Allergies   Patient has no known allergies.   Review of Systems Review of Systems  Constitutional: Positive for diaphoresis.  HENT: Negative for congestion.   Eyes: Negative for visual disturbance.  Respiratory: Positive for chest tightness. Negative for shortness of breath.   Cardiovascular: Negative for chest pain.  Gastrointestinal: Positive for abdominal pain, nausea and vomiting. Negative for blood in stool and diarrhea.  Genitourinary: Negative for decreased urine volume and dysuria.  Musculoskeletal: Negative for back pain.  Neurological:  Negative for dizziness, weakness, light-headedness and headaches.  Psychiatric/Behavioral: Negative for behavioral problems.     Physical Exam Updated Vital Signs BP (!) 141/98   Pulse (!) 109   Temp 99.1 F (37.3 C) (Oral)   Resp 19   Ht '5\' 8"'  (1.727 m)   Wt 73 kg (161 lb)   SpO2 98%   BMI 24.48 kg/m   Physical Exam  Constitutional: He appears well-developed and well-nourished. No distress.  HENT:  Head: Atraumatic.  Eyes: EOM are normal.  Neck: Normal range of motion. No JVD present.  Cardiovascular: Intact distal pulses.   Tachycardia  Pulmonary/Chest: Effort normal.  Abdominal: Soft. He exhibits no distension. There is no tenderness. There is no guarding.  Negative McBurney's, negative Murphy's, no CVA tenderness.  Musculoskeletal: Normal range of motion. He exhibits no edema.  No unilateral leg swelling  Neurological: He is alert.  Skin: Skin is warm. Capillary refill takes less than 2 seconds.  Psychiatric: He has a normal mood and affect.     ED Treatments / Results  Labs (all labs ordered are listed, but only abnormal results are displayed) Labs Reviewed  BASIC METABOLIC PANEL - Abnormal; Notable for the following:       Result Value   Sodium 131 (*)    Chloride 100 (*)    Glucose, Bld 280 (*)    All other components within normal limits  CBC - Abnormal; Notable for the following:    RBC 5.91 (*)    All other components within normal limits  CBG MONITORING, ED - Abnormal; Notable for the following:    Glucose-Capillary 227 (*)    All other components within normal limits  D-DIMER, QUANTITATIVE (NOT AT [facility])  BRAIN NATRIURETIC PEPTIDE  TSH  TROPONIN I  HEPARIN LEVEL (UNFRACTIONATED)  CBC  TROPONIN I  TROPONIN I  HEMOGLOBIN A1C  I-STAT TROPOININ, ED  I-STAT TROPOININ, ED    EKG  EKG Interpretation None       Radiology Dg Chest 2 View  Result Date: 11/25/2016 CLINICAL DATA:  46 year old male with left chest pain onset this morning.  Diaphoresis and nausea. Similar episode 4 days ago. EXAM: CHEST  2 VIEW COMPARISON:  11/20/2016 and earlier. FINDINGS: Mediastinal contours remain normal. The lungs are clear. No pneumothorax or pleural effusion. Tracheal air column appears mildly enlarged but otherwise normal. No acute osseous abnormality identified. Negative visible bowel gas pattern. IMPRESSION: No acute cardiopulmonary abnormality. Electronically Signed   By: Genevie Ann M.D.   On: 11/25/2016 17:05    Procedures Procedures (including critical care time)  Medications Ordered in ED Medications  aspirin tablet 325 mg (325 mg Oral Given 11/25/16 1800)  heparin ADULT infusion 100 units/mL (25000 units/238m sodium chloride 0.45%) (1,000 Units/hr Intravenous New Bag/Given 11/25/16 1840)  insulin glargine (LANTUS) injection 24 Units (not administered)  insulin aspart (novoLOG) injection 0-5 Units (not administered)  insulin aspart (novoLOG) injection 0-9 Units (not administered)  nitroGLYCERIN (NITROSTAT) SL tablet  0.4 mg (0.4 mg Sublingual Given 11/25/16 1943)  heparin bolus via infusion 4,000 Units (4,000 Units Intravenous Bolus from Bag 11/25/16 1840)     Initial Impression / Assessment and Plan / ED Course  I have reviewed the triage vital signs and the nursing notes.  Pertinent labs & imaging results that were available during my care of the patient were reviewed by me and considered in my medical decision making (see chart for details).     46 year old with past medical history significant for diabetes and tobacco use, who presents to the emergency department with typical chest pain for one hour. Patient with 3 emergency departments for chest pain in the past 2 months.   Initial EKG showed ST elevation V1-V3, ST depression inferior and lateral leads. Normal intervals. Atrial enlargement. Signs of LVH. Patient has a concerning EKG but there is no change when compared to prior EKGs over the past 2 months. Patient has not had any  stress testing or heart catheterization. Clinical picture concerning for ACS. Initial troponin negative. Discussed the patient's case with cardiology, Dr. Burt Knack, given EKGs that are similar in the past will not activate the Cath Lab at this time but will admit, put on heparin, likely will go to the cath lab on Monday.  Patient given aspirin, nitroglycerin, heparin. Doubt PE, low risk well's criteria, d-dimer undetectable. No significant electrolyte abnormalities. Glu 280, AG 9, Bicarb 22 - pt not in DKA. No leukocytosis. Chest x-ray without signs of pneumonia or pneumothorax.  Patient admitted to cardiology. Patient stable for the floor. Final Clinical Impressions(s) / ED Diagnoses   Final diagnoses:  Chest pain, unspecified type    New Prescriptions New Prescriptions   No medications on file     Nathaniel Man, MD 11/25/16 2357    Elnora Morrison, MD 11/28/16 [phone]

## 2016-11-25 NOTE — ED Notes (Signed)
Pt ambulatory to the restroom.  

## 2016-11-25 NOTE — ED Triage Notes (Signed)
Pt c/o chest pain, diaphoresis, and nausea that began an hour PTA. Pt states pain woke him from sleep. Describes pain as "squeezing". States he was seen previously for the same.

## 2016-11-25 NOTE — H&P (Signed)
CARDIOLOGY INPATIENT HISTORY AND PHYSICAL EXAMINATION NOTE  Patient ID: Joshua Escobar MRN: [id number], DOB/AGE: 1971/03/04   Admit date: 11/25/2016   Primary Physician: Golden Circle, FNP Primary Cardiologist: new  Reason for admission: chest pain  HPI: This is a 46 y.o. AA male with no known history of CAD but has significant risk factors of diabetes and abnormal ECG presented with epigastric pain.  Patient was admitted 2 months ago with similar complaints and at that time CODE STEMI was called which was cancelled. Echo at that time did nto demonstarte WMA with preserved LVEF. He was r/o for ACS with cycling troponin which were negative. He was treated for AKI and was d/c'ed home. Patient remain symptomless since then however again presented with n/v, epigastric pain which radiated to the chest and started at rest. He does have some cramping and SOB associated with it. CODE STEMI was again called and was cancelled due to no changes in ECG. He has not been able to eat since morning due to nausea. He had similar pain at rest 2 d ago. This pain was not present when he presented 2 mo ago.    Problem List: Past Medical History:  Diagnosis Date  . Migraine headache   . Type II diabetes mellitus ([place])     Past Surgical History:  Procedure Laterality Date  . NO PAST SURGERIES       Allergies: No Known Allergies   Home Medications Current Facility-Administered Medications  Medication Dose Route Frequency Provider Last Rate Last Dose  . aspirin tablet 325 mg  325 mg Oral Daily Mumma, Shannon, MD   325 mg at 11/25/16 1800  . heparin ADULT infusion 100 units/mL (25000 units/24m sodium chloride 0.45%)  1,000 Units/hr Intravenous Continuous Rumbarger, RValeda Malm RPH 10 mL/hr at 11/25/16 1840 1,000 Units/hr at 11/25/16 1840  . nitroGLYCERIN (NITROSTAT) SL tablet 0.4 mg  0.4 mg Sublingual Q5 min PRN MNathaniel Man MD   0.4 mg at 11/25/16 1849   Current Outpatient Prescriptions    Medication Sig Dispense Refill  . blood glucose meter kit and supplies Dispense based on patient and insurance preference. Use up to four times daily as directed. (FOR ICD-9 250.00, 250.01). 1 each 0  . insulin aspart (NOVOLOG FLEXPEN) 100 UNIT/ML FlexPen Inject 7 Units into the skin 3 (three) times daily with meals. 6.3 mL 0  . Insulin Glargine (LANTUS SOLOSTAR) 100 UNIT/ML Solostar Pen Inject 25 Units into the skin at bedtime. (Patient taking differently: Inject 24 Units into the skin at bedtime. ) 7.5 mL 0  . Insulin Pen Needle 31G X 5 MM MISC Use 4 times daily with insulin 120 each 0  . Multiple Vitamin (MULTIVITAMIN WITH MINERALS) TABS tablet Take 1 tablet by mouth daily.    .Marland Kitchenomeprazole (PRILOSEC) 20 MG capsule Take 1 capsule (20 mg total) by mouth daily. 30 capsule 0  . ondansetron (ZOFRAN ODT) 8 MG disintegrating tablet Take 1 tablet (8 mg total) by mouth every 8 (eight) hours as needed for nausea or vomiting. 10 tablet 0     Family History  Problem Relation Age of Onset  . Diabetes Mellitus II Sister   . Diabetes Mellitus II Brother   . Diabetes Mellitus II Mother   . Diabetes Mellitus II Father      Social History   Social History  . Marital status: Divorced    Spouse name: N/A  . Number of children: 2  . Years of education: 141  Occupational  History  . Engineer, civil (consulting)    Social History Main Topics  . Smoking status: Current Every Day Smoker    Packs/day: 0.50    Years: 20.00    Types: Cigarettes  . Smokeless tobacco: Never Used  . Alcohol use 3.6 oz/week    6 Cans of beer per week  . Drug use: No  . Sexual activity: Not Currently   Other Topics Concern  . Not on file   Social History Narrative   Fun/Hobby: Basketball, fish, bowling.      Review of Systems: General: poor appetite  Cardiovascular: chest pain, dyspnea negative for dyspnea on exertion, edema, orthopnea, palpitations, paroxysmal nocturnal dyspnea or shortness of breath  Dermatological:  negative for rash Respiratory: negative for cough or wheezing Urologic: negative for hematuria Abdominal: nausea, abdominal pain, cramps, vomiting      Neurologic: negative for visual changes, syncope, or dizziness Endocrine: diabetes,  Immunological: no lymph adenopathy Psych: non homicidal/suicidal  Physical Exam: Vitals: BP (!) 125/92   Pulse 92   Temp 99.1 F (37.3 C) (Oral)   Resp 14   Ht '5\' 8"'  (1.727 m)   Wt 73 kg (161 lb)   SpO2 100%   BMI 24.48 kg/m  General: not in acute distress Neck: JVP flat, neck supple Heart: regular rate and rhythm, S1, S2, no murmurs  Lungs: CTAB  GI: non tender, non distended, bowel sounds present Extremities: no edema Neuro: AAO x 3  Psych: normal affect, no anxiety   Labs:   Results for orders placed or performed during the hospital encounter of 11/25/16 (from the past 24 hour(s))  Basic metabolic panel     Status: Abnormal   Collection Time: 11/25/16  4:40 PM  Result Value Ref Range   Sodium 131 (L) 135 - 145 mmol/L   Potassium 4.0 3.5 - 5.1 mmol/L   Chloride 100 (L) 101 - 111 mmol/L   CO2 22 22 - 32 mmol/L   Glucose, Bld 280 (H) 65 - 99 mg/dL   BUN 11 6 - 20 mg/dL   Creatinine, Ser 0.87 0.61 - 1.24 mg/dL   Calcium 9.7 8.9 - 10.3 mg/dL   GFR calc non Af Amer >60 >60 mL/min   GFR calc Af Amer >60 >60 mL/min   Anion gap 9 5 - 15  CBC     Status: Abnormal   Collection Time: 11/25/16  4:40 PM  Result Value Ref Range   WBC 5.2 4.0 - 10.5 K/uL   RBC 5.91 (H) 4.22 - 5.81 MIL/uL   Hemoglobin 15.7 13.0 - 17.0 g/dL   HCT 47.8 39.0 - 52.0 %   MCV 80.9 78.0 - 100.0 fL   MCH 26.6 26.0 - 34.0 pg   MCHC 32.8 30.0 - 36.0 g/dL   RDW 12.8 11.5 - 15.5 %   Platelets 264 150 - 400 K/uL  I-stat troponin, ED     Status: None   Collection Time: 11/25/16  4:46 PM  Result Value Ref Range   Troponin i, poc 0.00 0.00 - 0.08 ng/mL   Comment 3          D-dimer, quantitative (not at [facility])     Status: None   Collection Time: 11/25/16  6:00 PM    Result Value Ref Range   D-Dimer, Quant <0.27 0.00 - 0.50 ug/mL-FEU     Radiology/Studies: Dg Chest 2 View  Result Date: 11/25/2016 CLINICAL DATA:  46 year old male with left chest pain onset this morning. Diaphoresis and nausea. Similar  episode 4 days ago. EXAM: CHEST  2 VIEW COMPARISON:  11/20/2016 and earlier. FINDINGS: Mediastinal contours remain normal. The lungs are clear. No pneumothorax or pleural effusion. Tracheal air column appears mildly enlarged but otherwise normal. No acute osseous abnormality identified. Negative visible bowel gas pattern. IMPRESSION: No acute cardiopulmonary abnormality. Electronically Signed   By: Genevie Ann M.D.   On: 11/25/2016 17:05   Dg Chest 2 View  Result Date: 11/20/2016 CLINICAL DATA:  Left chest pain for 2 days. EXAM: CHEST  2 VIEW COMPARISON:  10/03/2016 FINDINGS: The cardiomediastinal silhouette is unremarkable. There is no evidence of focal airspace disease, pulmonary edema, suspicious pulmonary nodule/mass, pleural effusion, or pneumothorax. No acute bony abnormalities are identified. IMPRESSION: No active cardiopulmonary disease. Electronically Signed   By: Margarette Canada M.D.   On: 11/20/2016 22:01   Ct Abdomen Pelvis W Contrast  Result Date: 11/21/2016 CLINICAL DATA:  Upper abdominal pain, nausea and vomiting. EXAM: CT ABDOMEN AND PELVIS WITH CONTRAST TECHNIQUE: Multidetector CT imaging of the abdomen and pelvis was performed using the standard protocol following bolus administration of intravenous contrast. CONTRAST:  130m ISOVUE-300 IOPAMIDOL (ISOVUE-300) INJECTION 61% COMPARISON:  None FINDINGS: Lower chest: No acute abnormality. Hepatobiliary: No focal liver abnormality is seen. No gallstones, gallbladder wall thickening, or biliary dilatation. Pancreas: Unremarkable. No pancreatic ductal dilatation or surrounding inflammatory changes. Spleen: Normal in size without focal abnormality. Adrenals/Urinary Tract: Adrenal glands are unremarkable. Kidneys  are normal, without renal calculi, focal lesion, or hydronephrosis. Bladder is unremarkable. Stomach/Bowel: Stomach is within normal limits. Appendix is normal. No evidence of bowel wall thickening, distention, or inflammatory changes. Vascular/Lymphatic: Mild aortic atherosclerosis. No aneurysm or dissection. Reproductive: Unremarkable Other: No focal inflammation. No ascites. Low midline anterior abdominal wall subcutaneous 1.5 cm lesion, probably an incidental sebaceous cyst. Musculoskeletal: No significant skeletal lesion. IMPRESSION: Mild aortic atherosclerosis. No acute findings in the abdomen or pelvis. Electronically Signed   By: DAndreas NewportM.D.   On: 11/21/2016 06:00    EKG: sinus tachycardia, ST elevation in the anterior leads with T wave inversions in the inferior leads with ST depression, LVH  Echo: 10/03/2016 Left ventricle: The cavity size was normal. Systolic function was   normal. The estimated ejection fraction was in the range of 55%   to 60%. Wall motion was normal; there were no regional wall   motion abnormalities. Left ventricular diastolic function   parameters were normal. - Atrial septum: No defect or patent foramen ovale was identified.  Cardiac cath: none prior  Medical decision making:  Discussed care with the patient Discussed care with the ED physician on the phone Reviewed labs and imaging personally Reviewed prior records  ASSESSMENT AND PLAN:  This is a 46y.o. male with IDDM2 presented with atypical chest pain and abnormal ECG.    Active Problems:   Unstable angina (HCC)   Unstable angina  increasing episodes of chest pain, recent at rest pain  - admit to telemetry floor  - recommend IV heparin - cycle troponin - echocardiogram in the AM - NPO post midnight - CBC, CMP, INR/PTT - TSH, HbA1c, lipid panel - consider cardiac cath on Monday  Diabetes mellitus without complication - SSI, CBG, hba1c, high dose statin  Abnormal ECG - not STEMI  as no acute changes  GERD - continue PPI     Signed, QFlossie Dibble MD MS 11/25/2016, 6:58 PM

## 2016-11-25 NOTE — ED Notes (Signed)
Pt provided with turkey sandwich and ginger ale.

## 2016-11-25 NOTE — Progress Notes (Signed)
ANTICOAGULATION CONSULT NOTE - Initial Consult  Pharmacy Consult for heparin Indication: chest pain/ACS  No Known Allergies  Patient Measurements: Height: 5\' 8"  (172.7 cm) Weight: 161 lb (73 kg) IBW/kg (Calculated) : 68.4 Heparin Dosing Weight: 73kg  Vital Signs: Temp: 99.1 F (37.3 C) (06/30 1634) Temp Source: Oral (06/30 1634) BP: 139/100 (06/30 1800) Pulse Rate: 89 (06/30 1800)  Labs:  Recent Labs  11/25/16 1640  HGB 15.7  HCT 47.8  PLT 264  CREATININE 0.87    Estimated Creatinine Clearance: 103.7 mL/min (by C-G formula based on SCr of 0.87 mg/dL).   Medical History: Past Medical History:  Diagnosis Date  . Migraine headache   . Type II diabetes mellitus (HCC)     Medications:  Infusions:  . heparin      Assessment: 46 yom presented to the ED with CP, diaphoresis and nausea. To start IV heparin for r/o ACS. CBC is WNL. He is not on anticoagulation PTA. Troponin negative so far.    Goal of Therapy:  Heparin level 0.3-0.7 units/ml Monitor platelets by anticoagulation protocol: Yes   Plan:  Heparin bolus 4000 units IV x 1 Heparin gtt 1000 units/hr Check a 6 hr heparin level Daily heparin level and CBC  Rumbarger, Drake Leach 11/25/2016,6:19 PM

## 2016-11-25 NOTE — ED Notes (Signed)
ED Provider at bedside. 

## 2016-11-26 ENCOUNTER — Inpatient Hospital Stay (HOSPITAL_COMMUNITY): Payer: BLUE CROSS/BLUE SHIELD

## 2016-11-26 DIAGNOSIS — I201 Angina pectoris with documented spasm: Secondary | ICD-10-CM

## 2016-11-26 LAB — COMPREHENSIVE METABOLIC PANEL
ALT: 14 U/L — ABNORMAL LOW (ref 17–63)
AST: 15 U/L (ref 15–41)
Albumin: 4 g/dL (ref 3.5–5.0)
Alkaline Phosphatase: 57 U/L (ref 38–126)
Anion gap: 8 (ref 5–15)
BUN: 10 mg/dL (ref 6–20)
CO2: 27 mmol/L (ref 22–32)
Calcium: 9.6 mg/dL (ref 8.9–10.3)
Chloride: 99 mmol/L — ABNORMAL LOW (ref 101–111)
Creatinine, Ser: 0.92 mg/dL (ref 0.61–1.24)
GFR calc Af Amer: >60 mL/min (ref >60)
GFR calc non Af Amer: >60 mL/min (ref >60)
Glucose, Bld: 166 mg/dL — ABNORMAL HIGH (ref 65–99)
Potassium: 3.6 mmol/L (ref 3.5–5.1)
Sodium: 134 mmol/L — ABNORMAL LOW (ref 135–145)
Total Bilirubin: 1.1 mg/dL (ref 0.3–1.2)
Total Protein: 7 g/dL (ref 6.5–8.1)

## 2016-11-26 LAB — TROPONIN I
Troponin I: <0.03 ng/mL (ref <0.03)
Troponin I: <0.03 ng/mL (ref <0.03)

## 2016-11-26 LAB — CBC
HCT: 46 % (ref 39.0–52.0)
HCT: 45.7 % (ref 39.0–52.0)
Hemoglobin: 15.3 g/dL (ref 13.0–17.0)
Hemoglobin: 15.3 g/dL (ref 13.0–17.0)
MCH: 26.9 pg (ref 26.0–34.0)
MCH: 26.9 pg (ref 26.0–34.0)
MCHC: 33.3 g/dL (ref 30.0–36.0)
MCHC: 33.5 g/dL (ref 30.0–36.0)
MCV: 80.8 fL (ref 78.0–100.0)
MCV: 80.3 fL (ref 78.0–100.0)
Platelets: 262 10*3/uL (ref 150–400)
Platelets: 258 10*3/uL (ref 150–400)
RBC: 5.69 MIL/uL (ref 4.22–5.81)
RBC: 5.69 MIL/uL (ref 4.22–5.81)
RDW: 12.7 % (ref 11.5–15.5)
RDW: 12.7 % (ref 11.5–15.5)
WBC: 6.6 10*3/uL (ref 4.0–10.5)
WBC: 6.2 10*3/uL (ref 4.0–10.5)

## 2016-11-26 LAB — HEPARIN LEVEL (UNFRACTIONATED)
Heparin Unfractionated: 0.24 IU/mL — ABNORMAL LOW (ref 0.30–0.70)
Heparin Unfractionated: 0.46 IU/mL (ref 0.30–0.70)

## 2016-11-26 LAB — ECHOCARDIOGRAM COMPLETE
Height: 68 in
Weight: 2328.06 oz

## 2016-11-26 LAB — GLUCOSE, CAPILLARY
Glucose-Capillary: 170 mg/dL — ABNORMAL HIGH (ref 65–99)
Glucose-Capillary: 92 mg/dL (ref 65–99)
Glucose-Capillary: 255 mg/dL — ABNORMAL HIGH (ref 65–99)
Glucose-Capillary: 98 mg/dL (ref 65–99)

## 2016-11-26 LAB — MRSA PCR SCREENING: MRSA by PCR: NEGATIVE

## 2016-11-26 LAB — HEMOGLOBIN A1C
Hgb A1c MFr Bld: 11.2 % — ABNORMAL HIGH (ref 4.8–5.6)
Mean Plasma Glucose: 275 mg/dL

## 2016-11-26 LAB — PROTIME-INR
INR: 1.07
Prothrombin Time: 14 seconds (ref 11.4–15.2)

## 2016-11-26 LAB — CBG MONITORING, ED: Glucose-Capillary: 276 mg/dL — ABNORMAL HIGH (ref 65–99)

## 2016-11-26 MED ORDER — SODIUM CHLORIDE 0.9 % IV SOLN: 250.0000 mL | INTRAVENOUS | Status: DC | PRN

## 2016-11-26 MED ORDER — ASPIRIN EC 81 MG PO TBEC
81.0000 mg | DELAYED_RELEASE_TABLET | Freq: Every day | ORAL | Status: DC
  Administered 2016-11-26: 81 mg via ORAL
  Filled 2016-11-26: qty 1

## 2016-11-26 MED ORDER — ASPIRIN 81 MG PO CHEW
81.0000 mg | CHEWABLE_TABLET | ORAL | Status: AC
  Administered 2016-11-27: 81 mg via ORAL
  Filled 2016-11-26: qty 1

## 2016-11-26 MED ORDER — ATORVASTATIN CALCIUM 80 MG PO TABS
80.0000 mg | ORAL_TABLET | Freq: Every day | ORAL | Status: DC
  Administered 2016-11-26 – 2016-11-27 (×2): 80 mg via ORAL
  Filled 2016-11-26 (×2): qty 1

## 2016-11-26 MED ORDER — SODIUM CHLORIDE 0.9% FLUSH
3.0000 mL | Freq: Two times a day (BID) | INTRAVENOUS | Status: DC
  Administered 2016-11-26 – 2016-11-27 (×3): 3 mL via INTRAVENOUS

## 2016-11-26 MED ORDER — BISACODYL 5 MG PO TBEC
5.0000 mg | DELAYED_RELEASE_TABLET | Freq: Every day | ORAL | Status: DC | PRN
  Administered 2016-11-26: 5 mg via ORAL
  Filled 2016-11-26: qty 1

## 2016-11-26 MED ORDER — SODIUM CHLORIDE 0.9% FLUSH
3.0000 mL | Freq: Two times a day (BID) | INTRAVENOUS | Status: DC
  Administered 2016-11-26 (×3): 3 mL via INTRAVENOUS

## 2016-11-26 MED ORDER — SODIUM CHLORIDE 0.9 % WEIGHT BASED INFUSION: 1.0000 mL/kg/h | INTRAVENOUS | Status: DC

## 2016-11-26 MED ORDER — SODIUM CHLORIDE 0.9 % WEIGHT BASED INFUSION
3.0000 mL/kg/h | INTRAVENOUS | Status: DC
  Administered 2016-11-27: 3 mL/kg/h via INTRAVENOUS

## 2016-11-26 MED ORDER — SODIUM CHLORIDE 0.9% FLUSH: 3.0000 mL | INTRAVENOUS | Status: DC | PRN

## 2016-11-26 MED ORDER — INSULIN ASPART 100 UNIT/ML ~~LOC~~ SOLN
7.0000 [IU] | Freq: Three times a day (TID) | SUBCUTANEOUS | Status: DC
  Administered 2016-11-26 – 2016-11-27 (×4): 7 [IU] via SUBCUTANEOUS

## 2016-11-26 MED ORDER — MORPHINE SULFATE (PF) 4 MG/ML IV SOLN
2.0000 mg | INTRAVENOUS | Status: DC | PRN
  Administered 2016-11-26: 2 mg via INTRAVENOUS
  Filled 2016-11-26: qty 1

## 2016-11-26 NOTE — Progress Notes (Signed)
Md paged pt still having 3/10 cp after received from Ed.  No PRN medications ordered.  Vs stable.  Placed on 4L [name].  New orders received. Will continue to monitor Joshua Escobar

## 2016-11-26 NOTE — Progress Notes (Signed)
ANTICOAGULATION CONSULT NOTE   Pharmacy Consult for heparin Indication: chest pain/ACS  No Known Allergies  Patient Measurements: Height: 5\' 8"  (172.7 cm) Weight: 145 lb 8.1 oz (66 kg) IBW/kg (Calculated) : 68.4 Heparin Dosing Weight: 73kg  Vital Signs: Temp: 98.7 F (37.1 C) (07/01 0743) Temp Source: Oral (07/01 0743) BP: 122/98 (07/01 0743) Pulse Rate: 78 (07/01 0743)  Labs:  Recent Labs  11/25/16 1640 11/25/16 2026 11/26/16 0249 11/26/16 0746 11/26/16 1100  HGB 15.7  --  15.3 15.3  --   HCT 47.8  --  46.0 45.7  --   PLT 264  --  262 258  --   LABPROT  --   --   --  14.0  --   INR  --   --   --  1.07  --   HEPARINUNFRC  --   --  0.24*  --  0.46  CREATININE 0.87  --   --  0.92  --   TROPONINI  --  <0.03 <0.03 <0.03  --     Estimated Creatinine Clearance: 94.7 mL/min (by C-G formula based on SCr of 0.92 mg/dL).   Medical History: Past Medical History:  Diagnosis Date  . Migraine headache   . Type II diabetes mellitus (HCC)     Medications:  Infusions:  . sodium chloride    . [START ON 11/27/2016] sodium chloride     Followed by  . [START ON 11/27/2016] sodium chloride    . heparin 1,150 Units/hr (11/26/16 0326)    Assessment: 46 yom continuing on IV heparin for r/o ACS. He is not on anticoagulation PTA. Troponin negative so far.   Heparin level now therapeutic at 0.46 on heparin infusion at 1150 units/hr. CBC stable.     Goal of Therapy:  Heparin level 0.3-0.7 units/ml Monitor platelets by anticoagulation protocol: Yes   Plan:  Continue heparin gtt at 1150 units/hr Daily heparin level and CBC Monitor for s/sx bleeding Plan cath 7/2   Babs Bertin, PharmD, BCPS Clinical Pharmacist Rx Phone # for today: [phone] After 3:30PM, please call Main Rx: [phone] 11/26/2016 11:53 AM

## 2016-11-26 NOTE — Progress Notes (Signed)
Progress Note  Patient Name: Joshua Escobar Date of Encounter: 11/26/2016  Primary Cardiologist: New  Subjective   No chest pain or shortness of breath at rest, eating breakfast.  Inpatient Medications    Scheduled Meds: . aspirin EC  81 mg Oral Daily  . atorvastatin  80 mg Oral q1800  . insulin aspart  0-5 Units Subcutaneous QHS  . insulin aspart  0-9 Units Subcutaneous TID WC  . insulin aspart  7 Units Subcutaneous TID WC  . insulin glargine  24 Units Subcutaneous QHS  . sodium chloride flush  3 mL Intravenous Q12H   Continuous Infusions: . heparin 1,150 Units/hr (11/26/16 0326)   PRN Meds: morphine injection   Vital Signs    Vitals:   11/26/16 0445 11/26/16 0500 11/26/16 0600 11/26/16 0743  BP: 111/87 128/90 (!) 134/102 (!) 122/98  Pulse: 83 94 75 78  Resp: 16 (!) 29 14 10   Temp:    98.7 F (37.1 C)  TempSrc:    Oral  SpO2: 100% 100% 100% 100%  Weight:      Height:        Intake/Output Summary (Last 24 hours) at 11/26/16 0833 Last data filed at 11/26/16 0600  Gross per 24 hour  Intake           114.83 ml  Output              500 ml  Net          -385.17 ml   Filed Weights   11/25/16 1632 11/26/16 0227  Weight: 161 lb (73 kg) 145 lb 8.1 oz (66 kg)    Telemetry    Sinus rhythm and sinus tachycardia. Personally reviewed.  ECG    Tracing from 11/25/2016 shows sinus rhythm with anterior ST elevation and inferolateral ST segment depression. Personally reviewed.  Physical Exam   GEN: No acute distress.   Neck: No JVD. Cardiac: RRR, no murmur, rub, or gallop.  Respiratory: Nonlabored. Clear to auscultation bilaterally. GI: Soft, nontender, bowel sounds present. MS: No edema; No deformity.  Labs    Chemistry Recent Labs Lab 11/20/16 2141 11/25/16 1640  NA 134* 131*  K 3.8 4.0  CL 98* 100*  CO2 28 22  GLUCOSE 201* 280*  BUN 10 11  CREATININE 0.90 0.87  CALCIUM 9.7 9.7  PROT 7.4  --   ALBUMIN 4.6  --   AST 21  --   ALT 16*  --     ALKPHOS 61  --   BILITOT 1.3*  --   GFRNONAA >60 >60  GFRAA >60 >60  ANIONGAP 8 9     Hematology Recent Labs Lab 11/25/16 1640 11/26/16 0249 11/26/16 0746  WBC 5.2 6.6 6.2  RBC 5.91* 5.69 5.69  HGB 15.7 15.3 15.3  HCT 47.8 46.0 45.7  MCV 80.9 80.8 80.3  MCH 26.6 26.9 26.9  MCHC 32.8 33.3 33.5  RDW 12.8 12.7 12.7  PLT 264 262 258    Cardiac Enzymes Recent Labs Lab 11/25/16 2026 11/26/16 0249  TROPONINI <0.03 <0.03    Recent Labs Lab 11/20/16 2148 11/25/16 1646 11/25/16 2034  TROPIPOC 0.01 0.00 0.00     BNP Recent Labs Lab 11/25/16 2026  BNP 4.8     DDimer  Recent Labs Lab 11/25/16 1800  DDIMER <0.27     Radiology    Dg Chest 2 View  Result Date: 11/25/2016 CLINICAL DATA:  46 year old male with left chest pain onset this morning. Diaphoresis and nausea.  Similar episode 4 days ago. EXAM: CHEST  2 VIEW COMPARISON:  11/20/2016 and earlier. FINDINGS: Mediastinal contours remain normal. The lungs are clear. No pneumothorax or pleural effusion. Tracheal air column appears mildly enlarged but otherwise normal. No acute osseous abnormality identified. Negative visible bowel gas pattern. IMPRESSION: No acute cardiopulmonary abnormality. Electronically Signed   By: Odessa Fleming M.D.   On: 11/25/2016 17:05    Cardiac Studies   Echocardiogram 10/03/2016: Study Conclusions  - Left ventricle: The cavity size was normal. Systolic function was   normal. The estimated ejection fraction was in the range of 55%   to 60%. Wall motion was normal; there were no regional wall   motion abnormalities. Left ventricular diastolic function   parameters were normal. - Atrial septum: No defect or patent foramen ovale was identified.  Patient Profile     46 y.o. male with a history of type 2 diabetes mellitus and tobacco abuse, now presenting with atypical chest pain and chronically abnormal ECG, troponin I levels negative. He is admitted for treatment of unstable angina with  plan for cardiac catheterization on Monday.  Assessment & Plan    1. Unstable angina, ECG chronically abnormal and troponin I level negative. No active chest pain this morning.  2. Type 2 diabetes mellitus.  3. Tobacco abuse.  Plan for cardiac catheterization tomorrow. Continue aspirin, Lipitor, insulin, and heparin.  Signed, Nona Dell, MD  11/26/2016, 8:33 AM

## 2016-11-26 NOTE — Progress Notes (Signed)
ANTICOAGULATION CONSULT NOTE   Pharmacy Consult for heparin Indication: chest pain/ACS  No Known Allergies  Patient Measurements: Height: 5\' 8"  (172.7 cm) Weight: 145 lb 8.1 oz (66 kg) IBW/kg (Calculated) : 68.4 Heparin Dosing Weight: 73kg  Vital Signs: Temp: 98.9 F (37.2 C) (07/01 0227) Temp Source: Oral (06/30 1634) BP: 119/90 (07/01 0227) Pulse Rate: 89 (07/01 0227)  Labs:  Recent Labs  11/25/16 1640 11/25/16 2026 11/26/16 0249  HGB 15.7  --  15.3  HCT 47.8  --  46.0  PLT 264  --  262  HEPARINUNFRC  --   --  0.24*  CREATININE 0.87  --   --   TROPONINI  --  <0.03  --     Estimated Creatinine Clearance: 100.1 mL/min (by C-G formula based on SCr of 0.87 mg/dL).   Medical History: Past Medical History:  Diagnosis Date  . Migraine headache   . Type II diabetes mellitus (HCC)     Medications:  Infusions:  . heparin 1,000 Units/hr (11/26/16 0256)    Assessment: 46 yom presented to the ED with CP, diaphoresis and nausea. To start IV heparin for r/o ACS. CBC is WNL. He is not on anticoagulation PTA. Troponin negative so far.   Initial heparin level is slightly subtherapeutic at 0.24 on heparin infusion at 1000 units/hr. CBC stable.     Goal of Therapy:  Heparin level 0.3-0.7 units/ml Monitor platelets by anticoagulation protocol: Yes   Plan:  1. Increase heparin infusion to 1150 units/hr  2. Recheck heparin level in around 6 hours 3. Follow up cardiology plans   Pollyann Samples, PharmD, BCPS 11/26/2016, 3:30 AM

## 2016-11-26 NOTE — Progress Notes (Signed)
  Echocardiogram 2D Echocardiogram has been performed.  Joshua  Escobar 11/26/2016, 9:56 AM

## 2016-11-26 NOTE — ED Notes (Signed)
Lantus from pharmacy had 23 units not 24units, pharmacy called and will tube down another lantus

## 2016-11-27 ENCOUNTER — Encounter (HOSPITAL_COMMUNITY)
Admission: EM | Disposition: A | Discharge status: Home / Self Care | Payer: Self-pay | Source: Home / Self Care | Attending: Cardiovascular Disease

## 2016-11-27 DIAGNOSIS — R079 Chest pain, unspecified: Secondary | ICD-10-CM

## 2016-11-27 HISTORY — PX: LEFT HEART CATH AND CORONARY ANGIOGRAPHY: CATH118249

## 2016-11-27 LAB — LIPID PANEL
Cholesterol: 123 mg/dL (ref 0–200)
HDL: 63 mg/dL (ref >40)
LDL Cholesterol: 51 mg/dL (ref 0–99)
Total CHOL/HDL Ratio: 2 RATIO
Triglycerides: 46 mg/dL (ref <150)
VLDL: 9 mg/dL (ref 0–40)

## 2016-11-27 LAB — CBC
HCT: 44.3 % (ref 39.0–52.0)
Hemoglobin: 14.6 g/dL (ref 13.0–17.0)
MCH: 26.4 pg (ref 26.0–34.0)
MCHC: 33 g/dL (ref 30.0–36.0)
MCV: 80.3 fL (ref 78.0–100.0)
Platelets: 264 10*3/uL (ref 150–400)
RBC: 5.52 MIL/uL (ref 4.22–5.81)
RDW: 12.6 % (ref 11.5–15.5)
WBC: 6.4 10*3/uL (ref 4.0–10.5)

## 2016-11-27 LAB — BASIC METABOLIC PANEL
Anion gap: 8 (ref 5–15)
BUN: 12 mg/dL (ref 6–20)
CO2: 24 mmol/L (ref 22–32)
Calcium: 9.1 mg/dL (ref 8.9–10.3)
Chloride: 100 mmol/L — ABNORMAL LOW (ref 101–111)
Creatinine, Ser: 0.85 mg/dL (ref 0.61–1.24)
GFR calc Af Amer: >60 mL/min (ref >60)
GFR calc non Af Amer: >60 mL/min (ref >60)
Glucose, Bld: 203 mg/dL — ABNORMAL HIGH (ref 65–99)
Potassium: 3.6 mmol/L (ref 3.5–5.1)
Sodium: 132 mmol/L — ABNORMAL LOW (ref 135–145)

## 2016-11-27 LAB — GLUCOSE, CAPILLARY
Glucose-Capillary: 186 mg/dL — ABNORMAL HIGH (ref 65–99)
Glucose-Capillary: 238 mg/dL — ABNORMAL HIGH (ref 65–99)

## 2016-11-27 LAB — HEPARIN LEVEL (UNFRACTIONATED): Heparin Unfractionated: 0.47 IU/mL (ref 0.30–0.70)

## 2016-11-27 SURGERY — LEFT HEART CATH AND CORONARY ANGIOGRAPHY
Anesthesia: LOCAL

## 2016-11-27 MED ORDER — LIDOCAINE HCL (PF) 1 % IJ SOLN
INTRAMUSCULAR | Status: DC | PRN
  Administered 2016-11-27: 2 mL via INTRADERMAL

## 2016-11-27 MED ORDER — FENTANYL CITRATE (PF) 100 MCG/2ML IJ SOLN
INTRAMUSCULAR | Status: DC | PRN
  Administered 2016-11-27: 25 ug via INTRAVENOUS

## 2016-11-27 MED ORDER — MIDAZOLAM HCL 2 MG/2ML IJ SOLN
INTRAMUSCULAR | Status: DC | PRN
  Administered 2016-11-27: 1 mg via INTRAVENOUS

## 2016-11-27 MED ORDER — VERAPAMIL HCL 2.5 MG/ML IV SOLN
INTRAVENOUS | Status: AC
  Filled 2016-11-27: qty 2

## 2016-11-27 MED ORDER — MORPHINE SULFATE (PF) 4 MG/ML IV SOLN: 2.0000 mg | INTRAVENOUS | Status: DC | PRN

## 2016-11-27 MED ORDER — PANTOPRAZOLE SODIUM 40 MG PO TBEC: 40.0000 mg | DELAYED_RELEASE_TABLET | Freq: Every day | ORAL | 0 refills | Status: DC

## 2016-11-27 MED ORDER — SODIUM CHLORIDE 0.9 % IV SOLN: INTRAVENOUS | Status: AC

## 2016-11-27 MED ORDER — HEPARIN SODIUM (PORCINE) 1000 UNIT/ML IJ SOLN
INTRAMUSCULAR | Status: DC | PRN
  Administered 2016-11-27: 3500 [IU] via INTRAVENOUS

## 2016-11-27 MED ORDER — PANTOPRAZOLE SODIUM 40 MG PO TBEC: 40.0000 mg | DELAYED_RELEASE_TABLET | Freq: Every day | ORAL | Status: DC

## 2016-11-27 MED ORDER — ACETAMINOPHEN 325 MG PO TABS: 650.0000 mg | ORAL_TABLET | ORAL | Status: DC | PRN

## 2016-11-27 MED ORDER — SODIUM CHLORIDE 0.9% FLUSH: 3.0000 mL | INTRAVENOUS | Status: DC | PRN

## 2016-11-27 MED ORDER — SODIUM CHLORIDE 0.9 % IV SOLN: 250.0000 mL | INTRAVENOUS | Status: DC | PRN

## 2016-11-27 MED ORDER — IOPAMIDOL (ISOVUE-370) INJECTION 76%
INTRAVENOUS | Status: DC | PRN
  Administered 2016-11-27: 40 mL via INTRA_ARTERIAL

## 2016-11-27 MED ORDER — NITROGLYCERIN 1 MG/10 ML FOR IR/CATH LAB
INTRA_ARTERIAL | Status: AC
  Filled 2016-11-27: qty 10

## 2016-11-27 MED ORDER — IOPAMIDOL (ISOVUE-370) INJECTION 76%
INTRAVENOUS | Status: AC
  Filled 2016-11-27: qty 100

## 2016-11-27 MED ORDER — MIDAZOLAM HCL 2 MG/2ML IJ SOLN
INTRAMUSCULAR | Status: AC
  Filled 2016-11-27: qty 2

## 2016-11-27 MED ORDER — HEPARIN (PORCINE) IN NACL 2-0.9 UNIT/ML-% IJ SOLN
INTRAMUSCULAR | Status: AC
  Filled 2016-11-27: qty 1000

## 2016-11-27 MED ORDER — VERAPAMIL HCL 2.5 MG/ML IV SOLN
INTRAVENOUS | Status: DC | PRN
  Administered 2016-11-27: 10 mL via INTRA_ARTERIAL

## 2016-11-27 MED ORDER — SODIUM CHLORIDE 0.9% FLUSH: 3.0000 mL | Freq: Two times a day (BID) | INTRAVENOUS | Status: DC

## 2016-11-27 MED ORDER — LIDOCAINE HCL 1 % IJ SOLN
INTRAMUSCULAR | Status: AC
  Filled 2016-11-27: qty 20

## 2016-11-27 MED ORDER — HEPARIN (PORCINE) IN NACL 2-0.9 UNIT/ML-% IJ SOLN
INTRAMUSCULAR | Status: AC | PRN
  Administered 2016-11-27: 1000 mL

## 2016-11-27 MED ORDER — ONDANSETRON HCL 4 MG/2ML IJ SOLN: 4.0000 mg | Freq: Four times a day (QID) | INTRAMUSCULAR | Status: DC | PRN

## 2016-11-27 MED ORDER — HEPARIN SODIUM (PORCINE) 1000 UNIT/ML IJ SOLN
INTRAMUSCULAR | Status: AC
  Filled 2016-11-27: qty 1

## 2016-11-27 MED ORDER — FENTANYL CITRATE (PF) 100 MCG/2ML IJ SOLN
INTRAMUSCULAR | Status: AC
  Filled 2016-11-27: qty 2

## 2016-11-27 SURGICAL SUPPLY — 12 items
CATH INFINITI 5FR ANG PIGTAIL (CATHETERS) ×1 IMPLANT
CATH OPTITORQUE TIG 4.0 5F (CATHETERS) ×1 IMPLANT
DEVICE RAD COMP TR BAND LRG (VASCULAR PRODUCTS) ×1 IMPLANT
GLIDESHEATH SLEND A-KIT 6F 22G (SHEATH) ×1 IMPLANT
GUIDEWIRE INQWIRE 1.5J.035X260 (WIRE) IMPLANT
INQWIRE 1.5J .035X260CM (WIRE) ×2
KIT HEART LEFT (KITS) ×2 IMPLANT
PACK CARDIAC CATHETERIZATION (CUSTOM PROCEDURE TRAY) ×2 IMPLANT
SYR MEDRAD MARK V 150ML (SYRINGE) ×2 IMPLANT
TRANSDUCER W/STOPCOCK (MISCELLANEOUS) ×2 IMPLANT
TUBING CIL FLEX 10 FLL-RA (TUBING) ×2 IMPLANT
WIRE HI TORQ VERSACORE-J 145CM (WIRE) ×1 IMPLANT

## 2016-11-27 NOTE — Discharge Summary (Signed)
Discharge Summary    Patient ID: Joshua Escobar,  MRN: [id number], DOB/AGE: November 28, 1970 46 y.o.  Admit date: 11/25/2016 Discharge date: 11/28/2016  Primary Care Provider: [facility] Primary Cardiologist: Gwenlyn Found   Discharge Diagnoses    Active Problems:   Unstable angina [facility])   Chest pain  Allergies No Known Allergies  Diagnostic Studies/Procedures    LHC: 11/27/16  Conclusion     The left ventricular systolic function is normal.  LV end diastolic pressure is normal.  The left ventricular ejection fraction is 55-65% by visual estimate.    IMPRESSION: Joshua Escobar has normal coronary arteries and normal LV function. I believe his chest pain is noncardiac. His EKG is abnormal but "normal for him". The sheath was removed and a TR band was placed on the right wrist to achieve patent hemostasis. The patient left the lab in stable condition. He can be discharged home later today.  Quay Burow. MD, [facility] _____________   History of Present Illness     46 y.o. AA male with no known history of CAD but has significant risk factors of diabetes and abnormal ECG presented with epigastric pain. Patient was admitted 2 months ago with similar complaints and at that time CODE STEMI was called which was cancelled. Echo at that time did nto demonstarte WMA with preserved LVEF. He was r/o for ACS with cycling troponin which were negative. He was treated for AKI and was d/c'ed home. Patient remain symptomless since then however again presented with n/v, epigastric pain which radiated to the chest and started at rest. He did have some cramping and SOB associated with it. CODE STEMI was again called and was cancelled due to no changes in ECG. He reported ongoing nausea while in the ED. He had similar pain at rest 2 day prior to admission. He was admitted with plans to cycle enzymes, and placed on IV heparin.   Hospital Course    Trop negative x3. Other labs were stable. Underwent LHC  with Dr. Gwenlyn Found noted above with normal coronary anatomy and normal EF. EKG remained unchanged throughout admission. Suspect that his symptoms were more likely GI in nature and not cardiac. Will add PPI to medication regimen. Post cath site was stable. Instructed to follow up with PCP regarding symptoms and further management/work up.  _____________  Discharge Vitals Blood pressure 116/84, pulse 100, temperature 98.2 F (36.8 C), temperature source Oral, resp. rate 16, height _0  (1.727 m), weight 147 lb 7.8 oz (66.9 kg), SpO2 100%  Filed Weights   11/25/16 1632 11/26/16 0227 11/27/16 0330  Weight: 161 lb (73 kg) 145 lb 8.1 oz (66 kg) 147 lb 7.8 oz (66.9 kg)    Labs & Radiologic Studies    CBC  Recent Labs  11/26/16 0746 11/27/16 0254  WBC 6.2 6.4  HGB 15.3 14.6  HCT 45.7 44.3  MCV 80.3 80.3  PLT 258 915   Basic Metabolic Panel  Recent Labs  11/26/16 0746 11/27/16 0254  NA 134* 132*  K 3.6 3.6  CL 99* 100*  CO2 27 24  GLUCOSE 166* 203*  BUN 10 12  CREATININE 0.92 0.85  CALCIUM 9.6 9.1   Liver Function Tests  Recent Labs  11/26/16 0746  AST 15  ALT 14*  ALKPHOS 57  BILITOT 1.1  PROT 7.0  ALBUMIN 4.0   No results for input(s): LIPASE, AMYLASE in the last 72 hours. Cardiac Enzymes  Recent Labs  11/25/16 2026 11/26/16 0249 11/26/16  0746  TROPONINI <0.03 <0.03 <0.03   BNP Invalid input(s): POCBNP D-Dimer  Recent Labs  11/25/16 1800  DDIMER <0.27   Hemoglobin A1C  Recent Labs  11/25/16 2026  HGBA1C 11.2*   Fasting Lipid Panel  Recent Labs  11/27/16 0254  CHOL 123  HDL 63  LDLCALC 51  TRIG 46  CHOLHDL 2.0   Thyroid Function Tests  Recent Labs  11/25/16 2026  TSH 1.225   _____________  Dg Chest 2 View  Result Date: 11/25/2016 CLINICAL DATA:  46 year old male with left chest pain onset this morning. Diaphoresis and nausea. Similar episode 4 days ago. EXAM: CHEST  2 VIEW COMPARISON:  11/20/2016 and earlier. FINDINGS:  Mediastinal contours remain normal. The lungs are clear. No pneumothorax or pleural effusion. Tracheal air column appears mildly enlarged but otherwise normal. No acute osseous abnormality identified. Negative visible bowel gas pattern. IMPRESSION: No acute cardiopulmonary abnormality. Electronically Signed   By: Genevie Ann M.D.   On: 11/25/2016 17:05   Dg Chest 2 View  Result Date: 11/20/2016 CLINICAL DATA:  Left chest pain for 2 days. EXAM: CHEST  2 VIEW COMPARISON:  10/03/2016 FINDINGS: The cardiomediastinal silhouette is unremarkable. There is no evidence of focal airspace disease, pulmonary edema, suspicious pulmonary nodule/mass, pleural effusion, or pneumothorax. No acute bony abnormalities are identified. IMPRESSION: No active cardiopulmonary disease. Electronically Signed   By: Margarette Canada M.D.   On: 11/20/2016 22:01   Ct Abdomen Pelvis W Contrast  Result Date: 11/21/2016 CLINICAL DATA:  Upper abdominal pain, nausea and vomiting. EXAM: CT ABDOMEN AND PELVIS WITH CONTRAST TECHNIQUE: Multidetector CT imaging of the abdomen and pelvis was performed using the standard protocol following bolus administration of intravenous contrast. CONTRAST:  175m ISOVUE-300 IOPAMIDOL (ISOVUE-300) INJECTION 61% COMPARISON:  None FINDINGS: Lower chest: No acute abnormality. Hepatobiliary: No focal liver abnormality is seen. No gallstones, gallbladder wall thickening, or biliary dilatation. Pancreas: Unremarkable. No pancreatic ductal dilatation or surrounding inflammatory changes. Spleen: Normal in size without focal abnormality. Adrenals/Urinary Tract: Adrenal glands are unremarkable. Kidneys are normal, without renal calculi, focal lesion, or hydronephrosis. Bladder is unremarkable. Stomach/Bowel: Stomach is within normal limits. Appendix is normal. No evidence of bowel wall thickening, distention, or inflammatory changes. Vascular/Lymphatic: Mild aortic atherosclerosis. No aneurysm or dissection. Reproductive:  Unremarkable Other: No focal inflammation. No ascites. Low midline anterior abdominal wall subcutaneous 1.5 cm lesion, probably an incidental sebaceous cyst. Musculoskeletal: No significant skeletal lesion. IMPRESSION: Mild aortic atherosclerosis. No acute findings in the abdomen or pelvis. Electronically Signed   By: DAndreas NewportM.D.   On: 11/21/2016 06:00   Disposition   Pt is being discharged home today in good condition.  Follow-up Plans & Appointments    Follow-up Information    [facility] FNP Follow up.   Specialty:  Family Medicine Why:  Please follow up within 2 weeks regarding your symptoms.  Contact information: [address] [place] [phone]           Discharge Medications   Discharge Medication List as of 11/27/2016  7:19 PM    START taking these medications   Details  pantoprazole (PROTONIX) 40 MG tablet Take 1 tablet (40 mg total) by mouth daily., Starting Tue 11/28/2016, Normal      CONTINUE these medications which have NOT CHANGED   Details  blood glucose meter kit and supplies Dispense based on patient and insurance preference. Use up to four times daily as directed. (FOR ICD-9 250.00, 250.01)., Print  insulin aspart (NOVOLOG FLEXPEN) 100 UNIT/ML FlexPen Inject 7 Units into the skin 3 (three) times daily with meals., Starting Tue 10/31/2016, Until Thu 11/30/2016, No Print    Insulin Glargine (LANTUS SOLOSTAR) 100 UNIT/ML Solostar Pen Inject 25 Units into the skin at bedtime., Starting Tue 10/31/2016, Until Thu 11/30/2016, No Print    Insulin Pen Needle 31G X 5 MM MISC Use 4 times daily with insulin, Normal    Multiple Vitamin (MULTIVITAMIN WITH MINERALS) TABS tablet Take 1 tablet by mouth daily., Historical Med      STOP taking these medications     omeprazole (PRILOSEC) 20 MG capsule      ondansetron (ZOFRAN ODT) 8 MG disintegrating tablet           Outstanding Labs/Studies    Duration of Discharge Encounter    Greater than 30 minutes including physician time.  Signed, Reino Bellis NP-C 11/28/2016, 12:52 PM

## 2016-11-27 NOTE — Progress Notes (Signed)
Discharged from room 2 IV discontinued with Hemostasis achieved.  No bleeding from wirst site on right. Discharge papers signed. Out to front via wheelchair.

## 2016-11-27 NOTE — H&P (View-Only) (Signed)
Plan for cardiac cath later today. Labs reviewed. Pt is having no chest pain. Further plans to follow after cardiac cath.   Verne Carrow 11/27/2016 9:10 AM

## 2016-11-27 NOTE — Interval H&P Note (Signed)
Cath Lab Visit (complete for each Cath Lab visit)  Clinical Evaluation Leading to the Procedure:   ACS: Yes.    Non-ACS:    Anginal Classification: CCS III  Anti-ischemic medical therapy: No Therapy  Non-Invasive Test Results: No non-invasive testing performed  Prior CABG: No previous CABG      History and Physical Interval Note:  11/27/2016 3:44 PM  Joshua Escobar  has presented today for surgery, with the diagnosis of cp  The various methods of treatment have been discussed with the patient and family. After consideration of risks, benefits and other options for treatment, the patient has consented to  Procedure(s): Left Heart Cath and Coronary Angiography (N/A) as a surgical intervention .  The patient's history has been reviewed, patient examined, no change in status, stable for surgery.  I have reviewed the patient's chart and labs.  Questions were answered to the patient's satisfaction.     Nanetta Batty

## 2016-11-27 NOTE — Progress Notes (Signed)
Nutrition Brief Note  Patient identified on the Malnutrition Screening Tool (MST) Report  Wt Readings from Last 15 Encounters:  11/27/16 147 lb 7.8 oz (66.9 kg)  10/31/16 161 lb 12.8 oz (73.4 kg)  10/04/16 142 lb 10.2 oz (64.7 kg)  05/04/15 183 lb (83 kg)  06/30/13 183 lb 6.8 oz (83.2 kg)   This is a 46 y.o. AA male with no known history of CAD but has significant risk factors of diabetes and abnormal ECG presented with epigastric pain.   Pt NPO for cardiac cath today.   Spoke with pt, who repots decreased appetite over the past 1-2 weeks PTA, related to nausea and vomiting. Pt reports he was unable to keep most foods down during this time period, however, this issue has now resolved. Per pt, he consumed 100% of meals yesterday.   Pt reports UBW is around 160#. He denies any weight loss. Noted wt readings have ranged between 147-161#. Unable to obtain weight from bedscale, so unsure of current wt accuracy.   Nutrition-Focused physical exam completed. Findings are no fat depletion, mild (clavicel only) muscle depletion, and no edema.   Reviewed DM regimen. Outpatient DM regimen includes 25 units of insulin glagrine daily and 7 units of insulin aspart TID. Current hospital regimen includes HS scale correction coverage, meal coverage, and insulin aspart 7 units TID with meals and 24 units insulin glargine q HS. Pr reports he checks his CBGS 3-4 times per day and typically gets numbers within the 105 range, however, noted Hgb A1c of 11.2 (11/25/16). CBGS: 92-255 (trending down).   Body mass index is 22.43 kg/m. Patient meets criteria for normal weight range based on current BMI.   Current diet order is NPO, patient is consuming approximately n/a% of meals at this time. Labs and medications reviewed.   No nutrition interventions warranted at this time. If nutrition issues arise, please consult RD.   Jenifer A. Mayford Knife, RD, LDN, CDE Pager: [phone] After hours Pager: [phone]

## 2016-11-27 NOTE — Progress Notes (Signed)
Plan for cardiac cath later today. Labs reviewed. Pt is having no chest pain. Further plans to follow after cardiac cath.   Christopher McAlhany 11/27/2016 9:10 AM  

## 2016-11-27 NOTE — Progress Notes (Signed)
Results for ARIELLE, HOLLINS (MRN [id number]) as of 11/27/2016 13:19  Ref. Range 11/26/2016 07:41 11/26/2016 12:51 11/26/2016 17:58 11/26/2016 21:07 11/27/2016 12:06  Glucose-Capillary Latest Ref Range: 65 - 99 mg/dL 875 (H) 92 643 (H) 98 329 (H)  Noted that blood sugars have been less than 100 mg/dl X2. At noon today, CBG was 186 mg/dl.  Recommend changing Lantus dosage to 12 units daily and continue Novolog SENSITIVE correction scale TID & HS and Novolog 7 units TID if eats at least 50% of meals. Can titrate Lantus dosage as needed.  Will continue to monitor blood sugars while in the hospital.   Smith Mince RN BSN CDE Diabetes Coordinator Pager: [phone]  8am-5pm

## 2016-11-27 NOTE — Progress Notes (Signed)
ANTICOAGULATION CONSULT NOTE   Pharmacy Consult for heparin Indication: chest pain/ACS  No Known Allergies  Patient Measurements: Height: 5\' 8"  (172.7 cm) Weight: 147 lb 7.8 oz (66.9 kg) IBW/kg (Calculated) : 68.4 Heparin Dosing Weight: 73kg  Vital Signs: Temp: 98.7 F (37.1 C) (07/02 1203) Temp Source: Oral (07/02 1203) BP: 136/99 (07/02 1203) Pulse Rate: 88 (07/02 1203)  Labs:  Recent Labs  11/25/16 1640 11/25/16 2026 11/26/16 0249 11/26/16 0746 11/26/16 1100 11/27/16 0254  HGB 15.7  --  15.3 15.3  --  14.6  HCT 47.8  --  46.0 45.7  --  44.3  PLT 264  --  262 258  --  264  LABPROT  --   --   --  14.0  --   --   INR  --   --   --  1.07  --   --   HEPARINUNFRC  --   --  0.24*  --  0.46 0.47  CREATININE 0.87  --   --  0.92  --  0.85  TROPONINI  --  <0.03 <0.03 <0.03  --   --     Estimated Creatinine Clearance: 103.8 mL/min (by C-G formula based on SCr of 0.85 mg/dL).   Medical History: Past Medical History:  Diagnosis Date  . Migraine headache   . Type II diabetes mellitus (HCC)     Medications:  Infusions:  . sodium chloride    . sodium chloride 1 mL/kg/hr (11/27/16 0700)  . heparin 1,150 Units/hr (11/26/16 1516)    Assessment: 46 yom continuing on IV heparin for r/o ACS. He was not on anticoagulation PTA. Troponin negative so far. Plan cath later today.  Heparin level therapeutic at 0.76 on heparin infusion at 1150 units/hr. CBC stable.     Goal of Therapy:  Heparin level 0.3-0.7 units/ml Monitor platelets by anticoagulation protocol: Yes   Plan:  Continue heparin gtt at 1150 units/hr Daily heparin level and CBC Monitor for s/sx bleeding F/u after cath 7/2    Leota Sauers Pharm.D. CPP, BCPS Clinical Pharmacist [phone] 11/27/2016 1:33 PM

## 2016-11-28 ENCOUNTER — Encounter (HOSPITAL_COMMUNITY): Payer: Self-pay | Admitting: Cardiovascular Disease

## 2016-11-28 MED FILL — Nitroglycerin IV Soln 100 MCG/ML in D5W: INTRA_ARTERIAL | Qty: 10 | Status: AC

## 2016-12-23 ENCOUNTER — Emergency Department (HOSPITAL_COMMUNITY)
Admission: EM | Admit: 2016-12-23 | Discharge: 2016-12-24 | Disposition: A | Discharge status: Home / Self Care | Payer: BLUE CROSS/BLUE SHIELD | Attending: Emergency Medicine | Admitting: Emergency Medicine

## 2016-12-23 ENCOUNTER — Encounter (HOSPITAL_COMMUNITY): Payer: Self-pay

## 2016-12-23 DIAGNOSIS — Z794 Long term (current) use of insulin: Secondary | ICD-10-CM | POA: Diagnosis not present

## 2016-12-23 DIAGNOSIS — R109 Unspecified abdominal pain: Secondary | ICD-10-CM | POA: Diagnosis not present

## 2016-12-23 DIAGNOSIS — R1111 Vomiting without nausea: Secondary | ICD-10-CM | POA: Insufficient documentation

## 2016-12-23 DIAGNOSIS — R1032 Left lower quadrant pain: Secondary | ICD-10-CM | POA: Diagnosis not present

## 2016-12-23 DIAGNOSIS — E119 Type 2 diabetes mellitus without complications: Secondary | ICD-10-CM | POA: Insufficient documentation

## 2016-12-23 DIAGNOSIS — I10 Essential (primary) hypertension: Secondary | ICD-10-CM | POA: Diagnosis not present

## 2016-12-23 DIAGNOSIS — F17210 Nicotine dependence, cigarettes, uncomplicated: Secondary | ICD-10-CM | POA: Diagnosis not present

## 2016-12-23 DIAGNOSIS — R1110 Vomiting, unspecified: Secondary | ICD-10-CM | POA: Diagnosis not present

## 2016-12-23 DIAGNOSIS — R112 Nausea with vomiting, unspecified: Secondary | ICD-10-CM | POA: Diagnosis not present

## 2016-12-23 DIAGNOSIS — N433 Hydrocele, unspecified: Secondary | ICD-10-CM | POA: Diagnosis not present

## 2016-12-23 LAB — CBC
HCT: 45.5 % (ref 39.0–52.0)
Hemoglobin: 15.5 g/dL (ref 13.0–17.0)
MCH: 27.1 pg (ref 26.0–34.0)
MCHC: 34.1 g/dL (ref 30.0–36.0)
MCV: 79.7 fL (ref 78.0–100.0)
Platelets: 307 10*3/uL (ref 150–400)
RBC: 5.71 MIL/uL (ref 4.22–5.81)
RDW: 13.4 % (ref 11.5–15.5)
WBC: 7 10*3/uL (ref 4.0–10.5)

## 2016-12-23 NOTE — ED Triage Notes (Signed)
Vomiting and LLQ abdominal pain since Monday with nausea no fever and left flank pain.

## 2016-12-24 ENCOUNTER — Emergency Department (HOSPITAL_COMMUNITY): Payer: BLUE CROSS/BLUE SHIELD

## 2016-12-24 DIAGNOSIS — N433 Hydrocele, unspecified: Secondary | ICD-10-CM | POA: Diagnosis not present

## 2016-12-24 DIAGNOSIS — R1110 Vomiting, unspecified: Secondary | ICD-10-CM | POA: Diagnosis not present

## 2016-12-24 DIAGNOSIS — R109 Unspecified abdominal pain: Secondary | ICD-10-CM | POA: Diagnosis not present

## 2016-12-24 LAB — URINALYSIS, ROUTINE W REFLEX MICROSCOPIC
Bilirubin Urine: NEGATIVE
Glucose, UA: 50 mg/dL — AB
Hgb urine dipstick: NEGATIVE
Ketones, ur: 20 mg/dL — AB
Leukocytes, UA: NEGATIVE
Nitrite: NEGATIVE
Protein, ur: NEGATIVE mg/dL
Specific Gravity, Urine: 1.025 (ref 1.005–1.030)
pH: 5 (ref 5.0–8.0)

## 2016-12-24 LAB — COMPREHENSIVE METABOLIC PANEL
ALT: 17 U/L (ref 17–63)
AST: 46 U/L — ABNORMAL HIGH (ref 15–41)
Albumin: 4.8 g/dL (ref 3.5–5.0)
Alkaline Phosphatase: 50 U/L (ref 38–126)
Anion gap: 8 (ref 5–15)
BUN: 17 mg/dL (ref 6–20)
CO2: 27 mmol/L (ref 22–32)
Calcium: 9.7 mg/dL (ref 8.9–10.3)
Chloride: 100 mmol/L — ABNORMAL LOW (ref 101–111)
Creatinine, Ser: 1.3 mg/dL — ABNORMAL HIGH (ref 0.61–1.24)
GFR calc Af Amer: >60 mL/min (ref >60)
GFR calc non Af Amer: >60 mL/min (ref >60)
Glucose, Bld: 206 mg/dL — ABNORMAL HIGH (ref 65–99)
Potassium: 5.9 mmol/L — ABNORMAL HIGH (ref 3.5–5.1)
Sodium: 135 mmol/L (ref 135–145)
Total Bilirubin: 1.4 mg/dL — ABNORMAL HIGH (ref 0.3–1.2)
Total Protein: 8.2 g/dL — ABNORMAL HIGH (ref 6.5–8.1)

## 2016-12-24 LAB — BASIC METABOLIC PANEL
Anion gap: 7 (ref 5–15)
BUN: 15 mg/dL (ref 6–20)
CO2: 26 mmol/L (ref 22–32)
Calcium: 9 mg/dL (ref 8.9–10.3)
Chloride: 106 mmol/L (ref 101–111)
Creatinine, Ser: 0.75 mg/dL (ref 0.61–1.24)
GFR calc Af Amer: >60 mL/min (ref >60)
GFR calc non Af Amer: >60 mL/min (ref >60)
Glucose, Bld: 165 mg/dL — ABNORMAL HIGH (ref 65–99)
Potassium: 3.8 mmol/L (ref 3.5–5.1)
Sodium: 139 mmol/L (ref 135–145)

## 2016-12-24 LAB — PROTIME-INR
INR: 1.11
Prothrombin Time: 14.3 seconds (ref 11.4–15.2)

## 2016-12-24 LAB — I-STAT TROPONIN, ED: Troponin i, poc: 0 ng/mL (ref 0.00–0.08)

## 2016-12-24 LAB — TYPE AND SCREEN
ABO/RH(D): O POS
Antibody Screen: NEGATIVE

## 2016-12-24 LAB — ABO/RH: ABO/RH(D): O POS

## 2016-12-24 LAB — LIPASE, BLOOD: Lipase: 25 U/L (ref 11–51)

## 2016-12-24 MED ORDER — HYDROCODONE-ACETAMINOPHEN 5-325 MG PO TABS: 1.0000 | ORAL_TABLET | Freq: Four times a day (QID) | ORAL | 0 refills | Status: DC | PRN

## 2016-12-24 MED ORDER — SODIUM CHLORIDE 0.9 % IV BOLUS (SEPSIS)
1000.0000 mL | Freq: Once | INTRAVENOUS | Status: AC
  Administered 2016-12-24: 1000 mL via INTRAVENOUS

## 2016-12-24 MED ORDER — PANTOPRAZOLE SODIUM 40 MG PO TBEC: 40.0000 mg | DELAYED_RELEASE_TABLET | Freq: Every day | ORAL | 0 refills | Status: DC

## 2016-12-24 MED ORDER — IOPAMIDOL (ISOVUE-300) INJECTION 61%
INTRAVENOUS | Status: AC
  Filled 2016-12-24: qty 100

## 2016-12-24 MED ORDER — SODIUM CHLORIDE 0.9 % IV BOLUS (SEPSIS)
1000.0000 mL | Freq: Once | INTRAVENOUS | Status: AC
Start: 2016-12-24 — End: 2016-12-24
  Administered 2016-12-24: 1000 mL via INTRAVENOUS

## 2016-12-24 MED ORDER — ONDANSETRON HCL 4 MG/2ML IJ SOLN
4.0000 mg | Freq: Once | INTRAMUSCULAR | Status: AC
  Administered 2016-12-24: 4 mg via INTRAVENOUS
  Filled 2016-12-24: qty 2

## 2016-12-24 MED ORDER — MORPHINE SULFATE (PF) 2 MG/ML IV SOLN
4.0000 mg | Freq: Once | INTRAVENOUS | Status: AC
  Administered 2016-12-24: 4 mg via INTRAVENOUS
  Filled 2016-12-24: qty 2

## 2016-12-24 MED ORDER — ONDANSETRON 8 MG PO TBDP: ORAL_TABLET | ORAL | 0 refills | Status: DC

## 2016-12-24 MED ORDER — IOPAMIDOL (ISOVUE-300) INJECTION 61%
100.0000 mL | Freq: Once | INTRAVENOUS | Status: AC | PRN
  Administered 2016-12-24: 100 mL via INTRAVENOUS

## 2016-12-24 NOTE — ED Provider Notes (Signed)
[facility] DEPT Provider Note   CSN: [id number] Arrival date & time: 12/23/16  2244     History   Chief Complaint Chief Complaint  Patient presents with  . Abdominal Pain    HPI NIMA KEMPPAINEN is a 46 y.o. male with a hx of IDDM, migraine headache presents to the Emergency Department complaining of gradual, persistent, progressively worsening LLQ abd pain onset 5 days ago.  Pt reports the pain is severe and radiates into the left low back and flank. Associated symptoms include nausea and vomiting up to 5x per day.  He reports that today he vomited several blood clots.  He denies EtOH usage, NSAID usage, hx of GI bleed, melena, hematochezia.  He denies recent travel, anticoagulation, recent antibiotics, diarrhea, weakness, dizziness, syncope, near syncope, chest pain, SOB.  Nothing makes it better and nothing makes it worse.     Cardiac cath on 11/27/16 with normal LV function, normal coronaries and EF of 55-60% by Dr. Gwenlyn Found.   The history is provided by the patient and medical records. No language interpreter was used.    Past Medical History:  Diagnosis Date  . Migraine headache   . Type II diabetes mellitus [facility])     Patient Active Problem List   Diagnosis Date Noted  . Chest pain   . Unstable angina ([place]) 11/25/2016  . Abnormal ECG   . DKA, type 2 ([place]) 10/03/2016  . Acute kidney injury ([employer]) 10/03/2016  . HTN (hypertension) 10/03/2016  . Nausea & vomiting 10/03/2016  . HLD (hyperlipidemia) 10/03/2016  . Demand ischemia ([place])   . AKI (acute kidney injury) ([place])   . Uncontrolled type 2 diabetes mellitus without complication, with long-term current use of insulin ([place]) 07/04/2013    Past Surgical History:  Procedure Laterality Date  . LEFT HEART CATH AND CORONARY ANGIOGRAPHY N/A 11/27/2016   Procedure: Left Heart Cath and Coronary Angiography;  Surgeon: Lorretta Harp, MD;  Location: [place] [facility];  Service: Cardiovascular;  Laterality: N/A;  . NO PAST  SURGERIES         Home Medications    Prior to Admission medications   Medication Sig Start Date End Date Taking? Authorizing Provider  blood glucose meter kit and supplies Dispense based on patient and insurance preference. Use up to four times daily as directed. (FOR ICD-9 250.00, 250.01). 10/05/16  Yes Dessa Phi Chahn-Yang, DO  insulin aspart (NOVOLOG FLEXPEN) 100 UNIT/ML FlexPen Inject 7 Units into the skin 3 (three) times daily with meals. 10/31/16 12/24/16 Yes Golden Circle, FNP  Insulin Glargine (LANTUS SOLOSTAR) 100 UNIT/ML Solostar Pen Inject 25 Units into the skin at bedtime. Patient taking differently: Inject 24 Units into the skin at bedtime.  10/31/16 12/24/16 Yes Golden Circle, FNP  Insulin Pen Needle 31G X 5 MM MISC Use 4 times daily with insulin 10/05/16  Yes Dessa Phi Chahn-Yang, DO  Multiple Vitamin (MULTIVITAMIN WITH MINERALS) TABS tablet Take 1 tablet by mouth daily.   Yes [provider]  HYDROcodone-acetaminophen (NORCO/VICODIN) 5-325 MG tablet Take 1-2 tablets by mouth every 6 (six) hours as needed. 12/24/16   Muthersbaugh, Jarrett Soho, PA-C  ondansetron (ZOFRAN ODT) 8 MG disintegrating tablet 60m ODT q4 hours prn nausea 12/24/16   Muthersbaugh, HJarrett Soho PA-C  pantoprazole (PROTONIX) 40 MG tablet Take 1 tablet (40 mg total) by mouth daily. 12/24/16   Muthersbaugh, HJarrett Soho PA-C    Family History Family History  Problem Relation Age of Onset  . Diabetes Mellitus II Sister   .  Diabetes Mellitus II Brother   . Diabetes Mellitus II Mother   . Diabetes Mellitus II Father     Social History Social History  Substance Use Topics  . Smoking status: Current Every Day Smoker    Packs/day: 0.50    Years: 20.00    Types: Cigarettes  . Smokeless tobacco: Never Used  . Alcohol use 3.6 oz/week    6 Cans of beer per week     Allergies   Patient has no known allergies.   Review of Systems Review of Systems  Constitutional: Negative for appetite change,  diaphoresis, fatigue, fever and unexpected weight change.  HENT: Negative for mouth sores.   Eyes: Negative for visual disturbance.  Respiratory: Negative for cough, chest tightness, shortness of breath and wheezing.   Cardiovascular: Negative for chest pain.  Gastrointestinal: Positive for abdominal pain, nausea and vomiting. Negative for constipation and diarrhea.  Endocrine: Negative for polydipsia, polyphagia and polyuria.  Genitourinary: Negative for dysuria, frequency, hematuria and urgency.  Musculoskeletal: Negative for back pain and neck stiffness.  Skin: Negative for rash.  Allergic/Immunologic: Negative for immunocompromised state.  Neurological: Negative for syncope, light-headedness and headaches.  Hematological: Does not bruise/bleed easily.  Psychiatric/Behavioral: Negative for sleep disturbance. The patient is not nervous/anxious.   All other systems reviewed and are negative.    Physical Exam Updated Vital Signs BP (!) 139/104   Pulse 90   Temp 98.4 F (36.9 C) (Oral)   Resp 17   Ht 5' 8" (1.727 m)   Wt 73.9 kg (163 lb)   SpO2 99%   BMI 24.78 kg/m   Physical Exam  Constitutional: He appears well-developed and well-nourished. No distress.  Awake, alert, nontoxic appearance  HENT:  Head: Normocephalic and atraumatic.  Mouth/Throat: Oropharynx is clear and moist. Mucous membranes are dry. No oropharyngeal exudate.  Slightly dry mucous membranes  Eyes: Conjunctivae are normal. No scleral icterus.  Neck: Normal range of motion. Neck supple.  Cardiovascular: Normal rate, regular rhythm and intact distal pulses.   Pulmonary/Chest: Effort normal and breath sounds normal. No respiratory distress. He has no wheezes.  Equal chest expansion  Abdominal: Soft. Bowel sounds are normal. He exhibits no mass. There is tenderness in the left lower quadrant. There is no rigidity, no rebound, no guarding, no CVA tenderness, no tenderness at McBurney's point and negative  Murphy's sign. Hernia confirmed negative in the right inguinal area and confirmed negative in the left inguinal area.  Genitourinary: Penis normal. Right testis shows tenderness. Right testis shows no mass and no swelling. Right testis is descended. Cremasteric reflex is not absent on the right side. Left testis shows tenderness. Left testis shows no mass and no swelling. Left testis is descended. Cremasteric reflex is not absent on the left side. Circumcised.  Genitourinary Comments: Katrina, EMT present for genital exam as chaperone  Musculoskeletal: Normal range of motion. He exhibits no edema.  Lymphadenopathy: No inguinal adenopathy noted on the right or left side.  Neurological: He is alert.  Speech is clear and goal oriented Moves extremities without ataxia  Skin: Skin is warm and dry. He is not diaphoretic.  Psychiatric: He has a normal mood and affect.  Nursing note and vitals reviewed.    ED Treatments / Results  Labs (all labs ordered are listed, but only abnormal results are displayed) Labs Reviewed  COMPREHENSIVE METABOLIC PANEL - Abnormal; Notable for the following:       Result Value   Potassium 5.9 (*)    Chloride  100 (*)    Glucose, Bld 206 (*)    Creatinine, Ser 1.30 (*)    Total Protein 8.2 (*)    AST 46 (*)    Total Bilirubin 1.4 (*)    All other components within normal limits  URINALYSIS, ROUTINE W REFLEX MICROSCOPIC - Abnormal; Notable for the following:    Glucose, UA 50 (*)    Ketones, ur 20 (*)    All other components within normal limits  BASIC METABOLIC PANEL - Abnormal; Notable for the following:    Glucose, Bld 165 (*)    All other components within normal limits  LIPASE, BLOOD  CBC  PROTIME-INR  I-STAT TROPONIN, ED  TYPE AND SCREEN  ABO/RH    EKG  EKG Interpretation  Date/Time:  Sunday December 24 2016 00:17:38 EDT Ventricular Rate:  94 PR Interval:    QRS Duration: 83 QT Interval:  332 QTC Calculation: 416 R Axis:   77 Text  Interpretation:  Sinus rhythm Probable LVH with secondary repol abnrm Anterior ST elevation, probably due to LVH No significant change since last tracing Confirmed by Duffy Bruce [phone]) on 12/24/2016 12:19:41 AM Also confirmed by Duffy Bruce [phone])  on 12/24/2016 12:30:20 AM       Radiology US Scrotum  Result Date: 12/24/2016 CLINICAL DATA:  46 y/o M; bilateral scrotal pain and swelling for 1 week. EXAM: SCROTAL ULTRASOUND DOPPLER ULTRASOUND OF THE TESTICLES TECHNIQUE: Complete ultrasound examination of the testicles, epididymis, and other scrotal structures was performed. Color and spectral Doppler ultrasound were also utilized to evaluate blood flow to the testicles. COMPARISON:  None. FINDINGS: Right testicle Measurements: 4.0 x 2.2 x 1.6 cm. No mass or microlithiasis visualized. Left testicle Measurements: 3.8 x 2.2 x 1.7 cm. No mass or microlithiasis visualized. Right epididymis:  Normal in size and appearance. Left epididymis:  Normal in size and appearance. Hydrocele:  Small right hydrocele. Varicocele:  None visualized. Pulsed Doppler interrogation of both testes demonstrates normal low resistance arterial and venous waveforms bilaterally. IMPRESSION: No acute process identified. Small right hydrocele. Otherwise unremarkable scrotal ultrasound and Doppler. Electronically Signed   By: Kristine Garbe M.D.   On: 12/24/2016 03:21   Ct Abdomen Pelvis W Contrast  Result Date: 12/24/2016 CLINICAL DATA:  Vomiting and left lower quadrant abdominal pain for 6 days. EXAM: CT ABDOMEN AND PELVIS WITH CONTRAST TECHNIQUE: Multidetector CT imaging of the abdomen and pelvis was performed using the standard protocol following bolus administration of intravenous contrast. CONTRAST:  120m ISOVUE-300 IOPAMIDOL (ISOVUE-300) INJECTION 61% COMPARISON:  11/21/2016 FINDINGS: Lower chest: No acute abnormality. Hepatobiliary: No focal liver abnormality is seen. No gallstones, gallbladder wall thickening,  or biliary dilatation. Pancreas: Unremarkable. No pancreatic ductal dilatation or surrounding inflammatory changes. Spleen: Normal in size without focal abnormality. Adrenals/Urinary Tract: Adrenal glands are unremarkable. Kidneys are normal, without renal calculi, focal lesion, or hydronephrosis. Bladder is unremarkable. Stomach/Bowel: Stomach is within normal limits. Appendix is normal. No evidence of bowel wall thickening, distention, or inflammatory changes. Vascular/Lymphatic: The abdominal aorta is normal in caliber with mild atherosclerotic calcification. No adenopathy in the abdomen or pelvis. Reproductive: Unremarkable Other: 1.5 cm subcutaneous lesion in the low midline anterior abdominal wall, probably an incidental sebaceous cyst. Musculoskeletal: No significant skeletal lesion. IMPRESSION: No significant abnormality. Electronically Signed   By: DAndreas NewportM.D.   On: 12/24/2016 01:14   [place]Art/ven Flow Abd Pelv Doppler  Result Date: 12/24/2016 CLINICAL DATA:  46y/o M; bilateral scrotal pain and swelling for 1 week. EXAM:  SCROTAL ULTRASOUND DOPPLER ULTRASOUND OF THE TESTICLES TECHNIQUE: Complete ultrasound examination of the testicles, epididymis, and other scrotal structures was performed. Color and spectral Doppler ultrasound were also utilized to evaluate blood flow to the testicles. COMPARISON:  None. FINDINGS: Right testicle Measurements: 4.0 x 2.2 x 1.6 cm. No mass or microlithiasis visualized. Left testicle Measurements: 3.8 x 2.2 x 1.7 cm. No mass or microlithiasis visualized. Right epididymis:  Normal in size and appearance. Left epididymis:  Normal in size and appearance. Hydrocele:  Small right hydrocele. Varicocele:  None visualized. Pulsed Doppler interrogation of both testes demonstrates normal low resistance arterial and venous waveforms bilaterally. IMPRESSION: No acute process identified. Small right hydrocele. Otherwise unremarkable scrotal ultrasound and Doppler.  Electronically Signed   By: Kristine Garbe M.D.   On: 12/24/2016 03:21    Procedures Procedures (including critical care time)  Medications Ordered in ED Medications  iopamidol (ISOVUE-300) 61 % injection (not administered)  iopamidol (ISOVUE-300) 61 % injection 100 mL (100 mLs Intravenous Contrast Given 12/24/16 0057)  sodium chloride 0.9 % bolus 1,000 mL (0 mLs Intravenous Stopped 12/24/16 0230)  morphine 2 MG/ML injection 4 mg (4 mg Intravenous Given 12/24/16 0226)  ondansetron (ZOFRAN) injection 4 mg (4 mg Intravenous Given 12/24/16 0226)  sodium chloride 0.9 % bolus 1,000 mL (1,000 mLs Intravenous New Bag/Given 12/24/16 0422)     Initial Impression / Assessment and Plan / ED Course  I have reviewed the triage vital signs and the nursing notes.  Pertinent labs & imaging results that were available during my care of the patient were reviewed by me and considered in my medical decision making (see chart for details).  Clinical Course as of Dec 25 534  Sun Dec 24, 2016  7062 Abdomen is soft and nontender on repeat exam.  [HM]  [phone] Patient with initial tachycardia but this has resolved. Pulse Rate: (!) 116 [HM]  0534 Elevated Potassium: (!) 5.9 [HM]  0534 AK I Creatinine: (!) 1.30 [HM]  0535 Improved with fluid hydration. Potassium: 3.8 [HM]  0535 Improved with fluid hydration. Creatinine: 0.75 [HM]  0535 Mild hyperglycemia, CO2 within normal limits and no anion gap. No evidence of DKA. Glucose: (!) 206 [HM]    Clinical Course User Index [HM] Muthersbaugh, Gwenlyn Perking    EScription presents with left lower quadrant abdominal pain, nausea and vomiting for 5 days. He reports several blood clots today however he has had no emesis here in the emergency department. No melena or hematochezia at home, and no anemia here.  Suspect sequela of a Mallory-Weiss tear. Patient appears dehydrated. CMP with hyperkalemia and mild AK. Patient has been given fluids.  CT scan of the abdomen  acute abnormality. Scrotal ultrasound evidence of torsion.  Patient with some tenderness to the skin of the left lower quadrant and back. No rashes seen.  The patient was discussed with and seen by Dr. Roxanne Mins who agrees with the treatment plan.  BP (!) 113/91   Pulse 79   Temp 98.4 F (36.9 C) (Oral)   Resp 13   Ht 5' 8" (1.727 m)   Wt 73.9 kg (163 lb)   SpO2 99%   BMI 24.78 kg/m     Final Clinical Impressions(s) / ED Diagnoses   Final diagnoses:  Left lower quadrant pain  Non-intractable vomiting with nausea, unspecified vomiting type    New Prescriptions New Prescriptions   HYDROCODONE-ACETAMINOPHEN (NORCO/VICODIN) 5-325 MG TABLET    Take 1-2 tablets by mouth every 6 (six) hours as needed.  ONDANSETRON (ZOFRAN ODT) 8 MG DISINTEGRATING TABLET    51m ODT q4 hours prn nausea   PANTOPRAZOLE (PROTONIX) 40 MG TABLET    Take 1 tablet (40 mg total) by mouth daily.     Muthersbaugh, HGwenlyn [id number]   GDelora Fuel MD [id number]   GDelora Fuel MD [id number]/22/[phone]

## 2016-12-24 NOTE — Discharge Instructions (Signed)
1. Medications: zofran, vicodin, protonix, usual home medications 2. Treatment: rest, drink plenty of fluids, advance diet slowly 3. Follow Up: Please followup with your primary doctor in 2 days for discussion of your diagnoses and further evaluation after today's visit; if you do not have a primary care doctor use the resource guide provided to find one; Please return to the ER for persistent vomiting, high fevers, vomiting blood, bloody or coffee ground stools, worsening pain or worsening symptoms

## 2016-12-28 ENCOUNTER — Ambulatory Visit (INDEPENDENT_AMBULATORY_CARE_PROVIDER_SITE_OTHER): Payer: BLUE CROSS/BLUE SHIELD | Admitting: Family

## 2016-12-28 ENCOUNTER — Encounter: Payer: Self-pay | Admitting: Family

## 2016-12-28 VITALS — BP 142/94 | HR 89 | Temp 99.0°F | Resp 16 | Ht 68.0 in | Wt 157.0 lb

## 2016-12-28 DIAGNOSIS — M545 Low back pain, unspecified: Secondary | ICD-10-CM

## 2016-12-28 DIAGNOSIS — R1084 Generalized abdominal pain: Secondary | ICD-10-CM | POA: Diagnosis not present

## 2016-12-28 DIAGNOSIS — E1140 Type 2 diabetes mellitus with diabetic neuropathy, unspecified: Secondary | ICD-10-CM | POA: Insufficient documentation

## 2016-12-28 DIAGNOSIS — E1142 Type 2 diabetes mellitus with diabetic polyneuropathy: Secondary | ICD-10-CM | POA: Diagnosis not present

## 2016-12-28 DIAGNOSIS — M549 Dorsalgia, unspecified: Secondary | ICD-10-CM | POA: Insufficient documentation

## 2016-12-28 MED ORDER — GABAPENTIN 100 MG PO CAPS: 100.0000 mg | ORAL_CAPSULE | Freq: Every day | ORAL | 0 refills | Status: DC

## 2016-12-28 MED ORDER — PREDNISONE 5 MG (21) PO TBPK: ORAL_TABLET | ORAL | 0 refills | Status: DC

## 2016-12-28 NOTE — Assessment & Plan Note (Signed)
Physical exam with sensitivity of bilateral low back pain even to light touch with no trauma or injury. No evidence of rash. No changes in bowel habits or saddle anesthesia. No evidence of infection. Range of motion within normal limits and question origin of musculoskeletal versus possible shingles as previous noted. Start prednisone taper. Treat conservatively with ice and home exercise therapy. Follow up if symptoms worsen or do not improve.

## 2016-12-28 NOTE — Patient Instructions (Signed)
Thank you for choosing [facility].  SUMMARY AND INSTRUCTIONS:  Ice / Moist heat x 20 minutes every 2 hours.   Continue with the Zofran as needed.   Stretches and exercises.  Follow up for worsening.   Medication:  Your prescription(s) have been submitted to your pharmacy or been printed and provided for you. Please take as directed and contact our office if you believe you are having problem(s) with the medication(s) or have any questions.  Referrals:  Referrals have been made during this visit. You should expect to hear back from our schedulers in about 7-10 days in regards to establishing an appointment with the specialists we discussed.   Follow up:  If your symptoms worsen or fail to improve, please contact our office for further instruction, or in case of emergency go directly to the emergency room at the closest medical facility.    Low Back Strain Rehab Ask your health care provider which exercises are safe for you. Do exercises exactly as told by your health care provider and adjust them as directed. It is normal to feel mild stretching, pulling, tightness, or discomfort as you do these exercises, but you should stop right away if you feel sudden pain or your pain gets worse. Do not begin these exercises until told by your health care provider. Stretching and range of motion exercises These exercises warm up your muscles and joints and improve the movement and flexibility of your back. These exercises also help to relieve pain, numbness, and tingling. Exercise A: Single knee to chest  1. Lie on your back on a firm surface with both legs straight. 2. Bend one of your knees. Use your hands to move your knee up toward your chest until you feel a gentle stretch in your lower back and buttock. ? Hold your leg in this position by holding onto the front of your knee. ? Keep your other leg as straight as possible. 3. Hold for __________ seconds. 4. Slowly return to the  starting position. 5. Repeat with your other leg. Repeat __________ times. Complete this exercise __________ times a day. Exercise B: Prone extension on elbows  1. Lie on your abdomen on a firm surface. 2. Prop yourself up on your elbows. 3. Use your arms to help lift your chest up until you feel a gentle stretch in your abdomen and your lower back. ? This will place some of your body weight on your elbows. If this is uncomfortable, try stacking pillows under your chest. ? Your hips should stay down, against the surface that you are lying on. Keep your hip and back muscles relaxed. 4. Hold for __________ seconds. 5. Slowly relax your upper body and return to the starting position. Repeat __________ times. Complete this exercise __________ times a day. Strengthening exercises These exercises build strength and endurance in your back. Endurance is the ability to use your muscles for a long time, even after they get tired. Exercise C: Pelvic tilt 1. Lie on your back on a firm surface. Bend your knees and keep your feet flat. 2. Tense your abdominal muscles. Tip your pelvis up toward the ceiling and flatten your lower back into the floor. ? To help with this exercise, you may place a small towel under your lower back and try to push your back into the towel. 3. Hold for __________ seconds. 4. Let your muscles relax completely before you repeat this exercise. Repeat __________ times. Complete this exercise __________ times a day. Exercise D:  Alternating arm and leg raises  1. Get on your hands and knees on a firm surface. If you are on a hard floor, you may want to use padding to cushion your knees, such as an exercise mat. 2. Line up your arms and legs. Your hands should be below your shoulders, and your knees should be below your hips. 3. Lift your left leg behind you. At the same time, raise your right arm and straighten it in front of you. ? Do not lift your leg higher than your hip. ? Do  not lift your arm higher than your shoulder. ? Keep your abdominal and back muscles tight. ? Keep your hips facing the ground. ? Do not arch your back. ? Keep your balance carefully, and do not hold your breath. 4. Hold for __________ seconds. 5. Slowly return to the starting position and repeat with your right leg and your left arm. Repeat __________ times. Complete this exercise __________times a day. Exercise J: Single leg lower with bent knees 1. Lie on your back on a firm surface. 2. Tense your abdominal muscles and lift your feet off the floor, one foot at a time, so your knees and hips are bent in an "L" shape (at about 90 degrees). ? Your knees should be over your hips and your lower legs should be parallel to the floor. 3. Keeping your abdominal muscles tense and your knee bent, slowly lower one of your legs so your toe touches the ground. 4. Lift your leg back up to return to the starting position. ? Do not hold your breath. ? Do not let your back arch. Keep your back flat against the ground. 5. Repeat with your other leg. Repeat __________ times. Complete this exercise __________ times a day. Posture and body mechanics  Body mechanics refers to the movements and positions of your body while you do your daily activities. Posture is part of body mechanics. Good posture and healthy body mechanics can help to relieve stress in your body's tissues and joints. Good posture means that your spine is in its natural S-curve position (your spine is neutral), your shoulders are pulled back slightly, and your head is not tipped forward. The following are general guidelines for applying improved posture and body mechanics to your everyday activities. Standing   When standing, keep your spine neutral and your feet about hip-width apart. Keep a slight bend in your knees. Your ears, shoulders, and hips should line up.  When you do a task in which you stand in one place for a long time, place one  foot up on a stable object that is 2-4 inches (5-10 cm) high, such as a footstool. This helps keep your spine neutral. Sitting   When sitting, keep your spine neutral and keep your feet flat on the floor. Use a footrest, if necessary, and keep your thighs parallel to the floor. Avoid rounding your shoulders, and avoid tilting your head forward.  When working at a desk or a computer, keep your desk at a height where your hands are slightly lower than your elbows. Slide your chair under your desk so you are close enough to maintain good posture.  When working at a computer, place your monitor at a height where you are looking straight ahead and you do not have to tilt your head forward or downward to look at the screen. Resting   When lying down and resting, avoid positions that are most painful for you.  If you  have pain with activities such as sitting, bending, stooping, or squatting (flexion-based activities), lie in a position in which your body does not bend very much. For example, avoid curling up on your side with your arms and knees near your chest (fetal position).  If you have pain with activities such as standing for a long time or reaching with your arms (extension-based activities), lie with your spine in a neutral position and bend your knees slightly. Try the following positions: ? Lying on your side with a pillow between your knees. ? Lying on your back with a pillow under your knees. Lifting   When lifting objects, keep your feet at least shoulder-width apart and tighten your abdominal muscles.  Bend your knees and hips and keep your spine neutral. It is important to lift using the strength of your legs, not your back. Do not lock your knees straight out.  Always ask for help to lift heavy or awkward objects. This information is not intended to replace advice given to you by your health care provider. Make sure you discuss any questions you have with your health care  provider. Document Released: 05/15/2005 Document Revised: 01/20/2016 Document Reviewed: 02/24/2015 Elsevier Interactive Patient Education  [employer].

## 2016-12-28 NOTE — Assessment & Plan Note (Signed)
Generalized abdominal pain in the setting of normal CT scan and worsening pain today that was initially improved. Recommend continue ondansetron as needed. If possible shingles related will be covered by prednisone taper. Continue to monitor with referral to GI if symptoms worsen or do not improve.

## 2016-12-28 NOTE — Progress Notes (Signed)
Subjective:    Patient ID: Joshua Escobar, male    DOB: 1971-01-09, 46 y.o.   MRN: [id number]  Chief Complaint  Patient presents with  . Establish Care    stomach and back still hurting, states he did have an episode of vomiting this morning    HPI:  Joshua Escobar is a 46 y.o. male who  has a past medical history of Migraine headache and Type II diabetes mellitus ([place]). and presents today for an acute office visit.  This is a new problem. Associated symptom of pain located in the lower left quadrant of his abdomen with radiating to his lower back has been going on for about 1 week and has been treated in the ED with no significant findings on his CT scan and diagnosed with early possible shingles. He was noted to have hyperkalemia and and elevated creatinine with concern for early AKI. He was given a prescription for hydrocodone-acetaminophen and ondansetron with instructions to follow up with GI and primary care. Since leaving the ED he was able to eat and had no discomfort or pain, but now is continues to have sharp pains radiating around his abdomen that initially improved and then worsened today. Pain is constant. No changes to bowel or bladder habits. No constipation or diarrhea. Frequency of bowel movements is twice per day. No trauma or injury. No rashes that he has seen. At night experiencing neuropathy that goes down both his legs and make his feet feel like they are really cold.   No Known Allergies    Outpatient Medications Prior to Visit  Medication Sig Dispense Refill  . blood glucose meter kit and supplies Dispense based on patient and insurance preference. Use up to four times daily as directed. (FOR ICD-9 250.00, 250.01). 1 each 0  . HYDROcodone-acetaminophen (NORCO/VICODIN) 5-325 MG tablet Take 1-2 tablets by mouth every 6 (six) hours as needed. 6 tablet 0  . Insulin Pen Needle 31G X 5 MM MISC Use 4 times daily with insulin 120 each 0  . Multiple Vitamin (MULTIVITAMIN  WITH MINERALS) TABS tablet Take 1 tablet by mouth daily.    . ondansetron (ZOFRAN ODT) 8 MG disintegrating tablet 72m ODT q4 hours prn nausea 4 tablet 0  . pantoprazole (PROTONIX) 40 MG tablet Take 1 tablet (40 mg total) by mouth daily. 30 tablet 0  . insulin aspart (NOVOLOG FLEXPEN) 100 UNIT/ML FlexPen Inject 7 Units into the skin 3 (three) times daily with meals. 6.3 mL 0  . Insulin Glargine (LANTUS SOLOSTAR) 100 UNIT/ML Solostar Pen Inject 25 Units into the skin at bedtime. (Patient taking differently: Inject 24 Units into the skin at bedtime. ) 7.5 mL 0   No facility-administered medications prior to visit.       Past Surgical History:  Procedure Laterality Date  . LEFT HEART CATH AND CORONARY ANGIOGRAPHY N/A 11/27/2016   Procedure: Left Heart Cath and Coronary Angiography;  Surgeon: BLorretta Harp MD;  [facility];  Service: Cardiovascular;  Laterality: N/A;  . NO PAST SURGERIES        Past Medical History:  Diagnosis Date  . Migraine headache   . Type II diabetes mellitus ([place]       Review of Systems  Constitutional: Negative for chills and fever.  Respiratory: Negative for chest tightness, shortness of breath and wheezing.   Cardiovascular: Negative for chest pain and palpitations.  Gastrointestinal: Positive for abdominal pain. Negative for blood in stool, constipation, diarrhea and  vomiting.  Musculoskeletal: Positive for back pain.  Neurological: Positive for numbness. Negative for weakness.      Objective:    BP (!) 142/94 (BP Location: Left Arm, Patient Position: Sitting, Cuff Size: Normal)   Pulse 89   Temp 99 F (37.2 C) (Oral)   Resp 16   Ht '5\' 8"'  (1.727 m)   Wt 157 lb (71.2 kg)   SpO2 96%   BMI 23.87 kg/m  Nursing note and vital signs reviewed.  Physical Exam  Constitutional: He is oriented to person, place, and time. He appears well-developed and well-nourished. No distress.  Cardiovascular: Normal rate, regular rhythm, normal  heart sounds and intact distal pulses.   Pulmonary/Chest: Effort normal and breath sounds normal. No respiratory distress. He has no wheezes. He has no rales. He exhibits no tenderness.  Abdominal: Normal appearance and bowel sounds are normal. There is no hepatosplenomegaly. There is generalized tenderness. There is no rigidity, no rebound, no guarding, no tenderness at McBurney's point and negative Murphy's sign.  Neurological: He is alert and oriented to person, place, and time.  Skin: Skin is warm and dry.  Psychiatric: He has a normal mood and affect. His behavior is normal. Judgment and thought content normal.       Assessment & Plan:   Problem List Items Addressed This Visit      Endocrine   Diabetic neuropathy ([place])    Symptoms and exam are consistent with diabetic neuropathy although cannot exclude possible lumbar pathology. Continue to work on blood sugar control. Start gabapentin for neuropathy. Follow up in 3 months or sooner if needed.         Other   Back pain - Primary    Physical exam with sensitivity of bilateral low back pain even to light touch with no trauma or injury. No evidence of rash. No changes in bowel habits or saddle anesthesia. No evidence of infection. Range of motion within normal limits and question origin of musculoskeletal versus possible shingles as previous noted. Start prednisone taper. Treat conservatively with ice and home exercise therapy. Follow up if symptoms worsen or do not improve.       Relevant Medications   predniSONE (STERAPRED UNI-PAK 21 TAB) 5 MG (21) TBPK tablet   Generalized abdominal pain    Generalized abdominal pain in the setting of normal CT scan and worsening pain today that was initially improved. Recommend continue ondansetron as needed. If possible shingles related will be covered by prednisone taper. Continue to monitor with referral to GI if symptoms worsen or do not improve.           I am having Mr. Youtsey start on  predniSONE and gabapentin. I am also having him maintain his Insulin Pen Needle, blood glucose meter kit and supplies, insulin aspart, Insulin Glargine, multivitamin with minerals, ondansetron, pantoprazole, and HYDROcodone-acetaminophen.   Meds ordered this encounter  Medications  . predniSONE (STERAPRED UNI-PAK 21 TAB) 5 MG (21) TBPK tablet    Sig: Take 6 tablets x 1 day, 5 tablets x 1 day, 4 tablets x 1 day, 3 tablets x 1 day, 2 tablets x 1 day, 1 tablet x 1 day    Dispense:  21 tablet    Refill:  0    Order Specific Question:   Supervising Provider    Answer:   Pricilla Holm A [[id number]]  . gabapentin (NEURONTIN) 100 MG capsule    Sig: Take 1 capsule (100 mg total) by mouth at bedtime.  Dispense:  30 capsule    Refill:  0    Order Specific Question:   Supervising Provider    Answer:   Pricilla Holm A [[id number]]     Follow-up: Return in about 3 months (around 03/30/2017), or if symptoms worsen or fail to improve.  Mauricio Po, FNP

## 2016-12-28 NOTE — Assessment & Plan Note (Signed)
Symptoms and exam are consistent with diabetic neuropathy although cannot exclude possible lumbar pathology. Continue to work on blood sugar control. Start gabapentin for neuropathy. Follow up in 3 months or sooner if needed.

## 2017-01-30 ENCOUNTER — Emergency Department (HOSPITAL_COMMUNITY): Payer: BLUE CROSS/BLUE SHIELD

## 2017-01-30 ENCOUNTER — Emergency Department (HOSPITAL_COMMUNITY)
Admission: EM | Admit: 2017-01-30 | Discharge: 2017-01-30 | Disposition: A | Discharge status: Home / Self Care | Payer: BLUE CROSS/BLUE SHIELD | Attending: Emergency Medicine | Admitting: Emergency Medicine

## 2017-01-30 DIAGNOSIS — E119 Type 2 diabetes mellitus without complications: Secondary | ICD-10-CM | POA: Insufficient documentation

## 2017-01-30 DIAGNOSIS — R079 Chest pain, unspecified: Secondary | ICD-10-CM | POA: Diagnosis not present

## 2017-01-30 DIAGNOSIS — R61 Generalized hyperhidrosis: Secondary | ICD-10-CM | POA: Insufficient documentation

## 2017-01-30 DIAGNOSIS — R0789 Other chest pain: Secondary | ICD-10-CM | POA: Insufficient documentation

## 2017-01-30 DIAGNOSIS — Z794 Long term (current) use of insulin: Secondary | ICD-10-CM | POA: Insufficient documentation

## 2017-01-30 DIAGNOSIS — Z79899 Other long term (current) drug therapy: Secondary | ICD-10-CM | POA: Diagnosis not present

## 2017-01-30 DIAGNOSIS — R110 Nausea: Secondary | ICD-10-CM | POA: Insufficient documentation

## 2017-01-30 LAB — CBC WITH DIFFERENTIAL/PLATELET
Basophils Absolute: 0 10*3/uL (ref 0.0–0.1)
Basophils Relative: 0 %
Eosinophils Absolute: 0 10*3/uL (ref 0.0–0.7)
Eosinophils Relative: 0 %
HCT: 41.5 % (ref 39.0–52.0)
Hemoglobin: 13.4 g/dL (ref 13.0–17.0)
Lymphocytes Relative: 41 %
Lymphs Abs: 2.3 10*3/uL (ref 0.7–4.0)
MCH: 25.9 pg — ABNORMAL LOW (ref 26.0–34.0)
MCHC: 32.3 g/dL (ref 30.0–36.0)
MCV: 80.3 fL (ref 78.0–100.0)
Monocytes Absolute: 0.5 10*3/uL (ref 0.1–1.0)
Monocytes Relative: 10 %
Neutro Abs: 2.7 10*3/uL (ref 1.7–7.7)
Neutrophils Relative %: 49 %
Platelets: 340 10*3/uL (ref 150–400)
RBC: 5.17 MIL/uL (ref 4.22–5.81)
RDW: 13.6 % (ref 11.5–15.5)
WBC: 5.6 10*3/uL (ref 4.0–10.5)

## 2017-01-30 LAB — COMPREHENSIVE METABOLIC PANEL
ALT: 15 U/L — ABNORMAL LOW (ref 17–63)
AST: 23 U/L (ref 15–41)
Albumin: 4 g/dL (ref 3.5–5.0)
Alkaline Phosphatase: 60 U/L (ref 38–126)
Anion gap: 10 (ref 5–15)
BUN: 17 mg/dL (ref 6–20)
CO2: 25 mmol/L (ref 22–32)
Calcium: 9.5 mg/dL (ref 8.9–10.3)
Chloride: 98 mmol/L — ABNORMAL LOW (ref 101–111)
Creatinine, Ser: 1.3 mg/dL — ABNORMAL HIGH (ref 0.61–1.24)
GFR calc Af Amer: >60 mL/min (ref >60)
GFR calc non Af Amer: >60 mL/min (ref >60)
Glucose, Bld: 297 mg/dL — ABNORMAL HIGH (ref 65–99)
Potassium: 4.4 mmol/L (ref 3.5–5.1)
Sodium: 133 mmol/L — ABNORMAL LOW (ref 135–145)
Total Bilirubin: 1.3 mg/dL — ABNORMAL HIGH (ref 0.3–1.2)
Total Protein: 6.7 g/dL (ref 6.5–8.1)

## 2017-01-30 LAB — TROPONIN I
Troponin I: <0.03 ng/mL (ref <0.03)
Troponin I: <0.03 ng/mL (ref <0.03)

## 2017-01-30 LAB — D-DIMER, QUANTITATIVE: D-Dimer, Quant: <0.27 ug/mL-FEU (ref 0.00–0.50)

## 2017-01-30 NOTE — ED Provider Notes (Signed)
  Physical Exam  BP 109/69   Pulse (!) 105   Temp 98.7 F (37.1 C) (Oral)   Resp 16   Ht 5\' 8"  (1.727 m)   Wt 71.2 kg (157 lb)   SpO2 98%   BMI 23.87 kg/m   Physical Exam  ED Course  Procedures  MDM Second troponin negative. Will discharge home. Patient has been given a copy of his EKG so he can compare with old if he goes somewhere or is seen with a do not have access to an old EKG.       Benjiman Core, MD 01/30/17 [phone]

## 2017-01-30 NOTE — ED Triage Notes (Signed)
Pt arrived via EMS after experiencing chest pain while at work. Pt has family hx of MI and hx of smoking and DM. Pt described pain as a "squeezing" and stated he was short of breath initially, but it resolved quickly. Chest pain has completely resolved and stated pain is currently 0/10. EMS gave 324 ASA and 3 nitro en route to ED. Pt is alert and oriented x4.

## 2017-01-30 NOTE — ED Provider Notes (Signed)
[place] [facility]PT [facility] Provider Note   CSN: [id number] Arrival date & time: 01/30/17  1308     History   Chief Complaint Chief Complaint  Patient presents with  . Chest Pain    HPI Joshua Escobar is a 46 y.o. male.  HPI Patient with left-sided chest pain while at work today. States he was eating lunch when symptoms started. Describes pain as squeezing in nature. Associated with nausea and diaphoresis. Given aspirin and nitroglycerin 3 by EMS en route. Pain is completely resolved. Denies any new lower extremity swelling or pain. States father had MI in his 42s. Past Medical History:  Diagnosis Date  . Migraine headache   . Type II diabetes mellitus [facility])     Patient Active Problem List   Diagnosis Date Noted  . Atypical chest pain 02/01/2017  . Back pain 12/28/2016  . Diabetic neuropathy ([place]) 12/28/2016  . Generalized abdominal pain 12/28/2016  . Chest pain   . Unstable angina ([place]) 11/25/2016  . Abnormal ECG   . DKA, type 2 ([place]) 10/03/2016  . Acute kidney injury ([place]) 10/03/2016  . HTN (hypertension) 10/03/2016  . Nausea & vomiting 10/03/2016  . HLD (hyperlipidemia) 10/03/2016  . Demand ischemia ([place])   . AKI (acute kidney injury) ([place])   . Uncontrolled type 2 diabetes mellitus without complication, with long-term current use of insulin ([place]) 07/04/2013    Past Surgical History:  Procedure Laterality Date  . LEFT HEART CATH AND CORONARY ANGIOGRAPHY N/A 11/27/2016   Procedure: Left Heart Cath and Coronary Angiography;  Surgeon: Lorretta Harp, MD;  Location: [place] [facility];  Service: Cardiovascular;  Laterality: N/A;  . NO PAST SURGERIES         Home Medications    Prior to Admission medications   Medication Sig Start Date End Date Taking? Authorizing Provider  blood glucose meter kit and supplies Dispense based on patient and insurance preference. Use up to four times daily as directed. (FOR ICD-9 250.00, 250.01). 10/05/16  Yes Dessa Phi  Chahn-Yang, DO  gabapentin (NEURONTIN) 100 MG capsule Take 1 capsule (100 mg total) by mouth at bedtime. 12/28/16  Yes Golden Circle, FNP  ibuprofen (ADVIL,MOTRIN) 200 MG tablet Take 400 mg by mouth every 6 (six) hours as needed for headache or mild pain.   Yes [provider]  insulin aspart (NOVOLOG FLEXPEN) 100 UNIT/ML FlexPen Inject 7 Units into the skin 3 (three) times daily with meals. 10/31/16 01/30/17 Yes Golden Circle, FNP  Insulin Glargine (LANTUS SOLOSTAR) 100 UNIT/ML Solostar Pen Inject 25 Units into the skin at bedtime. Patient taking differently: Inject 24 Units into the skin at bedtime.  10/31/16 01/30/17 Yes Golden Circle, FNP  Insulin Pen Needle 31G X 5 MM MISC Use 4 times daily with insulin 10/05/16  Yes Dessa Phi Chahn-Yang, DO  Multiple Vitamin (MULTIVITAMIN WITH MINERALS) TABS tablet Take 1 tablet by mouth daily.   Yes [provider]  ondansetron (ZOFRAN ODT) 8 MG disintegrating tablet 44m ODT q4 hours prn nausea Patient taking differently: Take 8 mg by mouth every 4 (four) hours as needed for nausea. 859mODT q4 hours prn nausea 12/24/16  Yes Muthersbaugh, HaJarrett SohoPA-C  dicyclomine (BENTYL) 10 MG capsule Take 1 capsule (10 mg total) by mouth 3 (three) times daily before meals. 02/01/17   CaGolden CircleFNP  HYDROcodone-acetaminophen (NORCO/VICODIN) 5-325 MG tablet Take 1-2 tablets by mouth every 6 (six) hours as needed. 12/24/16   Muthersbaugh, HaJarrett SohoPA-C  omeprazole (  PRILOSEC) 40 MG capsule Take 1 capsule (40 mg total) by mouth daily. 02/01/17   Golden Circle, FNP    Family History Family History  Problem Relation Age of Onset  . Diabetes Mellitus II Sister   . Diabetes Mellitus II Brother   . Diabetes Mellitus II Mother   . Diabetes Mellitus II Father     Social History Social History  Substance Use Topics  . Smoking status: Current Every Day Smoker    Packs/day: 0.50    Years: 20.00    Types: Cigarettes  . Smokeless tobacco: Never  Used  . Alcohol use 3.6 oz/week    6 Cans of beer per week     Allergies   Patient has no known allergies.   Review of Systems Review of Systems  Constitutional: Positive for diaphoresis. Negative for chills and fever.  HENT: Negative for sore throat and trouble swallowing.   Eyes: Negative for visual disturbance.  Respiratory: Positive for chest tightness. Negative for cough, shortness of breath and wheezing.   Cardiovascular: Negative for chest pain, palpitations and leg swelling.  Gastrointestinal: Positive for nausea. Negative for abdominal pain, diarrhea and vomiting.  Musculoskeletal: Negative for back pain, myalgias, neck pain and neck stiffness.  Skin: Negative for rash and wound.  Neurological: Negative for dizziness, weakness, light-headedness, numbness and headaches.  All other systems reviewed and are negative.    Physical Exam Updated Vital Signs BP 109/69   Pulse (!) 105   Temp 98.7 F (37.1 C) (Oral)   Resp 16   Ht _0  (1.727 m)   Wt 71.2 kg (157 lb)   SpO2 98%   BMI 23.87 kg/m   Physical Exam  Constitutional: He is oriented to person, place, and time. He appears well-developed and well-nourished.  HENT:  Head: Normocephalic and atraumatic.  Mouth/Throat: Oropharynx is clear and moist.  Eyes: Pupils are equal, round, and reactive to light. EOM are normal.  Neck: Normal range of motion. Neck supple.  Cardiovascular: Normal rate and regular rhythm.  Exam reveals no gallop and no friction rub.   No murmur heard. Pulmonary/Chest: Effort normal and breath sounds normal. No respiratory distress. He has no wheezes. He has no rales. He exhibits no tenderness.  Abdominal: Soft. Bowel sounds are normal. There is no tenderness. There is no rebound and no guarding.  Musculoskeletal: Normal range of motion. He exhibits no edema or tenderness.  No lower extremity swelling, asymmetry or tenderness. 2+ distal pulses  Neurological: He is alert and oriented to  person, place, and time.  5/5 motor in all extremities. Sensation fully intact.  Skin: Skin is warm and dry. Capillary refill takes less than 2 seconds. No rash noted. No erythema.  Psychiatric: He has a normal mood and affect. His behavior is normal.  Nursing note and vitals reviewed.    ED Treatments / Results  Labs (all labs ordered are listed, but only abnormal results are displayed) Labs Reviewed  CBC WITH DIFFERENTIAL/PLATELET - Abnormal; Notable for the following:       Result Value   MCH 25.9 (*)    All other components within normal limits  COMPREHENSIVE METABOLIC PANEL - Abnormal; Notable for the following:    Sodium 133 (*)    Chloride 98 (*)    Glucose, Bld 297 (*)    Creatinine, Ser 1.30 (*)    ALT 15 (*)    Total Bilirubin 1.3 (*)    All other components within normal limits  TROPONIN I  D-DIMER, QUANTITATIVE (NOT AT [facility])  TROPONIN I    EKG  EKG Interpretation  Date/Time:  Tuesday January 30 2017 13:13:20 EDT Ventricular Rate:  101 PR Interval:    QRS Duration: 83 QT Interval:  327 QTC Calculation: 424 R Axis:   74 Text Interpretation:  Sinus tachycardia Prominent P waves, nondiagnostic No significant change since last tracing Reconfirmed by Davonna Belling [phone]) on 01/30/2017 3:37:11 PM       Radiology No results found.  Procedures Procedures (including critical care time)  Medications Ordered in ED Medications - No data to display   Initial Impression / Assessment and Plan / ED Course  I have reviewed the triage vital signs and the nursing notes.  Pertinent labs & imaging results that were available during my care of the patient were reviewed by me and considered in my medical decision making (see chart for details).    After initial review of patient's EKG, code STEMI was called.On further review patient with coronary cath 2 months ago with normal coronary arteries. EKG is similar morphology to previous EKGs. Discussed with Dr. Burt Knack  who reviewed the patient's chart. Agrees with calling off code STEMI.  Discussed with oncoming emergency physician. Will check a delta troponin. Anticipate discharge home. Final Clinical Impressions(s) / ED Diagnoses   Final diagnoses:  Nonspecific chest pain    New Prescriptions Discharge Medication List as of 01/30/2017  6:14 PM       Julianne Rice, MD 02/02/17 [phone]

## 2017-01-30 NOTE — ED Notes (Signed)
Code Stemi @13 :18

## 2017-02-01 ENCOUNTER — Other Ambulatory Visit: Payer: Self-pay | Admitting: Family

## 2017-02-01 ENCOUNTER — Other Ambulatory Visit (INDEPENDENT_AMBULATORY_CARE_PROVIDER_SITE_OTHER): Payer: BLUE CROSS/BLUE SHIELD

## 2017-02-01 ENCOUNTER — Encounter: Payer: Self-pay | Admitting: Family

## 2017-02-01 ENCOUNTER — Ambulatory Visit (INDEPENDENT_AMBULATORY_CARE_PROVIDER_SITE_OTHER): Payer: BLUE CROSS/BLUE SHIELD | Admitting: Family

## 2017-02-01 VITALS — BP 114/80 | HR 125 | Temp 100.0°F | Resp 16 | Ht 68.0 in | Wt 149.4 lb

## 2017-02-01 DIAGNOSIS — R0789 Other chest pain: Secondary | ICD-10-CM | POA: Insufficient documentation

## 2017-02-01 DIAGNOSIS — R1084 Generalized abdominal pain: Secondary | ICD-10-CM

## 2017-02-01 LAB — CBC
HCT: 46.5 % (ref 39.0–52.0)
Hemoglobin: 15 g/dL (ref 13.0–17.0)
MCHC: 32.2 g/dL (ref 30.0–36.0)
MCV: 82.5 fl (ref 78.0–100.0)
Platelets: 336 10*3/uL (ref 150.0–400.0)
RBC: 5.64 Mil/uL (ref 4.22–5.81)
RDW: 14 % (ref 11.5–15.5)
WBC: 6.6 10*3/uL (ref 4.0–10.5)

## 2017-02-01 LAB — AMYLASE: Amylase: 11 U/L — ABNORMAL LOW (ref 27–131)

## 2017-02-01 LAB — HEMOGLOBIN A1C: Hgb A1c MFr Bld: 9.3 % — ABNORMAL HIGH (ref 4.6–6.5)

## 2017-02-01 LAB — LIPASE: Lipase: 14 U/L (ref 11.0–59.0)

## 2017-02-01 MED ORDER — OMEPRAZOLE 40 MG PO CPDR: 40.0000 mg | DELAYED_RELEASE_CAPSULE | Freq: Every day | ORAL | 3 refills | Status: DC

## 2017-02-01 MED ORDER — DICYCLOMINE HCL 10 MG PO CAPS: 10.0000 mg | ORAL_CAPSULE | Freq: Three times a day (TID) | ORAL | 0 refills | Status: DC

## 2017-02-01 NOTE — Assessment & Plan Note (Signed)
Atypical chest pain with most recent cardiac workup in the emergency department negative for acute coronary syndrome with stable EKG. Question possible vasospasms. Continue to monitor symptoms as they may also be related to gastrointestinal origin. Advised to seek further care if symptoms worsen or do not improve.

## 2017-02-01 NOTE — Progress Notes (Signed)
Subjective:    Patient ID: Joshua Escobar, male    DOB: September 25, 1970, 46 y.o.   MRN: [id number]  Chief Complaint  Patient presents with  . Hospitalization Follow-up    chest pain    HPI:  Joshua Escobar is a 46 y.o. male who  has a past medical history of Migraine headache and Type II diabetes mellitus ([place]). and presents today for a follow up  This is a new problem. Recently evaluated in the ED for chest pain. Initially brought in via EMS with "squeezing" chest pain that was relieved with nitroglycerin and aspirin. ECG was consistent with previous tracing with concern for STEMI which was further evaluated with previous cardiac cath 2 months prior with normal coronary arteries. He was discharged on pantoprazole and instructed to follow up if symptoms worsen or do not improve. All ED records, labs and imaging were reviewed in detail.   Since leaving the ED he reports taking the pantoprazole as prescribed and notes increased nausea when taking the medication and notes no significant improvement in symptoms. Continues to experience occasional chest pain described as squeezing on occasion generally waxing and waining lasting for about 20 minutes. Expresses diziness with change of position that is resolved in a few minutes. Drinking lot so of water. Has difficulty sleeping. Endorses subjective fevers going on for the past few days. Denies symptoms when leaning forward. Does have generalized stomach pains.   No Known Allergies    Outpatient Medications Prior to Visit  Medication Sig Dispense Refill  . blood glucose meter kit and supplies Dispense based on patient and insurance preference. Use up to four times daily as directed. (FOR ICD-9 250.00, 250.01). 1 each 0  . gabapentin (NEURONTIN) 100 MG capsule Take 1 capsule (100 mg total) by mouth at bedtime. 30 capsule 0  . HYDROcodone-acetaminophen (NORCO/VICODIN) 5-325 MG tablet Take 1-2 tablets by mouth every 6 (six) hours as needed. 6 tablet 0    . ibuprofen (ADVIL,MOTRIN) 200 MG tablet Take 400 mg by mouth every 6 (six) hours as needed for headache or mild pain.    . Insulin Pen Needle 31G X 5 MM MISC Use 4 times daily with insulin 120 each 0  . Multiple Vitamin (MULTIVITAMIN WITH MINERALS) TABS tablet Take 1 tablet by mouth daily.    . ondansetron (ZOFRAN ODT) 8 MG disintegrating tablet 20m ODT q4 hours prn nausea (Patient taking differently: Take 8 mg by mouth every 4 (four) hours as needed for nausea. 859mODT q4 hours prn nausea) 4 tablet 0  . predniSONE (STERAPRED UNI-PAK 21 TAB) 5 MG (21) TBPK tablet Take 6 tablets x 1 day, 5 tablets x 1 day, 4 tablets x 1 day, 3 tablets x 1 day, 2 tablets x 1 day, 1 tablet x 1 day 21 tablet 0  . insulin aspart (NOVOLOG FLEXPEN) 100 UNIT/ML FlexPen Inject 7 Units into the skin 3 (three) times daily with meals. 6.3 mL 0  . Insulin Glargine (LANTUS SOLOSTAR) 100 UNIT/ML Solostar Pen Inject 25 Units into the skin at bedtime. (Patient taking differently: Inject 24 Units into the skin at bedtime. ) 7.5 mL 0  . pantoprazole (PROTONIX) 40 MG tablet Take 1 tablet (40 mg total) by mouth daily. (Patient not taking: Reported on 02/01/2017) 30 tablet 0   No facility-administered medications prior to visit.       Past Surgical History:  Procedure Laterality Date  . LEFT HEART CATH AND CORONARY ANGIOGRAPHY N/A 11/27/2016   Procedure: Left  Heart Cath and Coronary Angiography;  Surgeon: Lorretta Harp, MD;  Location: [place] [facility];  Service: Cardiovascular;  Laterality: N/A;  . NO PAST SURGERIES        Past Medical History:  Diagnosis Date  . Migraine headache   . Type II diabetes mellitus ([place])       Review of Systems  Constitutional: Positive for chills and fever. Negative for activity change, appetite change and unexpected weight change.  Respiratory: Negative for cough, chest tightness and shortness of breath.   Cardiovascular: Positive for chest pain. Negative for palpitations and leg  swelling.  Gastrointestinal: Positive for abdominal pain. Negative for constipation, diarrhea, nausea and vomiting.      Objective:    BP 114/80 (BP Location: Left Arm, Patient Position: Sitting, Cuff Size: Normal)   Pulse (!) 125   Temp 100 F (37.8 C) (Oral)   Resp 16   Ht '5\' 8"'  (1.727 m)   Wt 149 lb 6.4 oz (67.8 kg)   SpO2 97%   BMI 22.72 kg/m  Nursing note and vital signs reviewed.  Physical Exam  Constitutional: He is oriented to person, place, and time. He appears well-developed and well-nourished. No distress.  Cardiovascular: Regular rhythm, normal heart sounds and intact distal pulses.  Tachycardia present.   Pulmonary/Chest: Effort normal and breath sounds normal.  Abdominal: Normal appearance. He exhibits no mass. There is no hepatosplenomegaly. There is generalized tenderness. There is no rigidity, no rebound, no guarding, no tenderness at McBurney's point and negative Murphy's sign.  Neurological: He is alert and oriented to person, place, and time.  Skin: Skin is warm and dry.  Psychiatric: He has a normal mood and affect. His behavior is normal. Judgment and thought content normal.        Assessment & Plan:   Problem List Items Addressed This Visit      Other   Generalized abdominal pain - Primary    Continues to experience generalized abdominal pain with previous CT scans being negative with the exception of a 1.5 cm sebaceous cyst unlikely related to his current pain. Obtain lipase, amylase, and CBC. Start Bentyl. Change pantoprazole to omeprazole. Referral to gastroenterology placed for further assessment and treatment. Advised to seek further care if symptoms worsen or do not improve.      Relevant Orders   CBC (Completed)   Lipase (Completed)   Amylase (Completed)   Hemoglobin A1c (Completed)   Atypical chest pain    Atypical chest pain with most recent cardiac workup in the emergency department negative for acute coronary syndrome with stable EKG.  Question possible vasospasms. Continue to monitor symptoms as they may also be related to gastrointestinal origin. Advised to seek further care if symptoms worsen or do not improve.          I have discontinued Mr. Bissonnette pantoprazole and predniSONE. I am also having him start on omeprazole and dicyclomine. Additionally, I am having him maintain his Insulin Pen Needle, blood glucose meter kit and supplies, insulin aspart, Insulin Glargine, multivitamin with minerals, ondansetron, HYDROcodone-acetaminophen, gabapentin, and ibuprofen.   Meds ordered this encounter  Medications  . omeprazole (PRILOSEC) 40 MG capsule    Sig: Take 1 capsule (40 mg total) by mouth daily.    Dispense:  30 capsule    Refill:  3    Order Specific Question:   Supervising Provider    Answer:   Pricilla Holm A [[id number]]  . dicyclomine (BENTYL) 10 MG capsule  Sig: Take 1 capsule (10 mg total) by mouth 3 (three) times daily before meals.    Dispense:  60 capsule    Refill:  0    Order Specific Question:   Supervising Provider    Answer:   Pricilla Holm A [[id number]]      Follow-up: Return in about 1 month (around 03/03/2017), or if symptoms worsen or fail to improve.  Mauricio Po, FNP

## 2017-02-01 NOTE — Patient Instructions (Addendum)
Thank you for choosing [facility].  SUMMARY AND INSTRUCTIONS:  A referral to GI has been placed.  Start the omeprazole in place of the pantoprazole.  Start Bentyl to help with the abdominal pain.  Follow up if symptoms worsen or do not improve.   Medication:  Your prescription(s) have been submitted to your pharmacy or been printed and provided for you. Please take as directed and contact our office if you believe you are having problem(s) with the medication(s) or have any questions.  Labs:  Please stop by the lab on the lower level of the building for your blood work. Your results will be released to MyChart (or called to you) after review, usually within 72 hours after test completion. If any changes need to be made, you will be notified at that same time.  1.) The lab is open from 7:30am to 5:30 pm Monday-Friday 2.) No appointment is necessary 3.) Fasting (if needed) is 6-8 hours after food and drink; black coffee and water are okay     Follow up:  If your symptoms worsen or fail to improve, please contact our office for further instruction, or in case of emergency go directly to the emergency room at the closest medical facility.

## 2017-02-01 NOTE — Assessment & Plan Note (Signed)
Continues to experience generalized abdominal pain with previous CT scans being negative with the exception of a 1.5 cm sebaceous cyst unlikely related to his current pain. Obtain lipase, amylase, and CBC. Start Bentyl. Change pantoprazole to omeprazole. Referral to gastroenterology placed for further assessment and treatment. Advised to seek further care if symptoms worsen or do not improve.

## 2017-02-08 ENCOUNTER — Encounter: Payer: Self-pay | Admitting: Family

## 2017-02-08 ENCOUNTER — Ambulatory Visit (INDEPENDENT_AMBULATORY_CARE_PROVIDER_SITE_OTHER): Payer: BLUE CROSS/BLUE SHIELD | Admitting: Family

## 2017-02-08 VITALS — BP 134/88 | HR 96 | Temp 98.9°F | Resp 16 | Ht 68.0 in | Wt 157.4 lb

## 2017-02-08 DIAGNOSIS — Z0000 Encounter for general adult medical examination without abnormal findings: Secondary | ICD-10-CM

## 2017-02-08 DIAGNOSIS — Z794 Long term (current) use of insulin: Secondary | ICD-10-CM

## 2017-02-08 DIAGNOSIS — IMO0001 Reserved for inherently not codable concepts without codable children: Secondary | ICD-10-CM

## 2017-02-08 DIAGNOSIS — E1165 Type 2 diabetes mellitus with hyperglycemia: Secondary | ICD-10-CM

## 2017-02-08 MED ORDER — DOXYCYCLINE HYCLATE 100 MG PO TABS: 100.0000 mg | ORAL_TABLET | Freq: Two times a day (BID) | ORAL | 0 refills | Status: DC

## 2017-02-08 NOTE — Patient Instructions (Addendum)
Thank you for choosing [facility].  SUMMARY AND INSTRUCTIONS:  Please continue to take your medications as prescribed.  Start the doxycycline - if no improvement we will plan to incise and drain it.   Blood work when fasting.   Medication:  Your prescription(s) have been submitted to your pharmacy or been printed and provided for you. Please take as directed and contact our office if you believe you are having problem(s) with the medication(s) or have any questions.  Labs:  Please stop by the lab on the lower level of the building for your blood work. Your results will be released to MyChart (or called to you) after review, usually within 72 hours after test completion. If any changes need to be made, you will be notified at that same time.  1.) The lab is open from 7:30am to 5:30 pm Monday-Friday 2.) No appointment is necessary 3.) Fasting (if needed) is 6-8 hours after food and drink; black coffee and water are okay   Follow up:  If your symptoms worsen or fail to improve, please contact our office for further instruction, or in case of emergency go directly to the emergency room at the closest medical facility.    Health Maintenance, Male A healthy lifestyle and preventive care is important for your health and wellness. Ask your health care provider about what schedule of regular examinations is right for you. What should I know about weight and diet? Eat a Healthy Diet  Eat plenty of vegetables, fruits, whole grains, low-fat dairy products, and lean protein.  Do not eat a lot of foods high in solid fats, added sugars, or salt.  Maintain a Healthy Weight Regular exercise can help you achieve or maintain a healthy weight. You should:  Do at least 150 minutes of exercise each week. The exercise should increase your heart rate and make you sweat (moderate-intensity exercise).  Do strength-training exercises at least twice a week.  Watch Your Levels of Cholesterol  and Blood Lipids  Have your blood tested for lipids and cholesterol every 5 years starting at 45 years of age. If you are at high risk for heart disease, you should start having your blood tested when you are 45 years old. You may need to have your cholesterol levels checked more often if: ? Your lipid or cholesterol levels are high. ? You are older than 45 years of age. ? You are at high risk for heart disease.  What should I know about cancer screening? Many types of cancers can be detected early and may often be prevented. Lung Cancer  You should be screened every year for lung cancer if: ? You are a current smoker who has smoked for at least 30 years. ? You are a former smoker who has quit within the past 15 years.  Talk to your health care provider about your screening options, when you should start screening, and how often you should be screened.  Colorectal Cancer  Routine colorectal cancer screening usually begins at 45 years of age and should be repeated every 5-10 years until you are 45 years old. You may need to be screened more often if early forms of precancerous polyps or small growths are found. Your health care provider may recommend screening at an earlier age if you have risk factors for colon cancer.  Your health care provider may recommend using home test kits to check for hidden blood in the stool.  A small camera at the end of a tube  can be used to examine your colon (sigmoidoscopy or colonoscopy). This checks for the earliest forms of colorectal cancer.  Prostate and Testicular Cancer  Depending on your age and overall health, your health care provider may do certain tests to screen for prostate and testicular cancer.  Talk to your health care provider about any symptoms or concerns you have about testicular or prostate cancer.  Skin Cancer  Check your skin from head to toe regularly.  Tell your health care provider about any new moles or changes in moles,  especially if: ? There is a change in a mole's size, shape, or color. ? You have a mole that is larger than a pencil eraser.  Always use sunscreen. Apply sunscreen liberally and repeat throughout the day.  Protect yourself by wearing long sleeves, pants, a wide-brimmed hat, and sunglasses when outside.  What should I know about heart disease, diabetes, and high blood pressure?  If you are 32-65 years of age, have your blood pressure checked every 3-5 years. If you are 91 years of age or older, have your blood pressure checked every year. You should have your blood pressure measured twice-once when you are at a hospital or clinic, and once when you are not at a hospital or clinic. Record the average of the two measurements. To check your blood pressure when you are not at a hospital or clinic, you can use: ? An automated blood pressure machine at a pharmacy. ? A home blood pressure monitor.  Talk to your health care provider about your target blood pressure.  If you are between 24-6 years old, ask your health care provider if you should take aspirin to prevent heart disease.  Have regular diabetes screenings by checking your fasting blood sugar level. ? If you are at a normal weight and have a low risk for diabetes, have this test once every three years after the age of 75. ? If you are overweight and have a high risk for diabetes, consider being tested at a younger age or more often.  A one-time screening for abdominal aortic aneurysm (AAA) by ultrasound is recommended for men aged 65-75 years who are current or former smokers. What should I know about preventing infection? Hepatitis B If you have a higher risk for hepatitis B, you should be screened for this virus. Talk with your health care provider to find out if you are at risk for hepatitis B infection. Hepatitis C Blood testing is recommended for:  Everyone born from [birth year] through 1964.  Anyone with known risk factors for  hepatitis C.  Sexually Transmitted Diseases (STDs)  You should be screened each year for STDs including gonorrhea and chlamydia if: ? You are sexually active and are younger than 45 years of age. ? You are older than 45 years of age and your health care provider tells you that you are at risk for this type of infection. ? Your sexual activity has changed since you were last screened and you are at an increased risk for chlamydia or gonorrhea. Ask your health care provider if you are at risk.  Talk with your health care provider about whether you are at high risk of being infected with HIV. Your health care provider may recommend a prescription medicine to help prevent HIV infection.  What else can I do?  Schedule regular health, dental, and eye exams.  Stay current with your vaccines (immunizations).  Do not use any tobacco products, such as cigarettes, chewing tobacco, and  e-cigarettes. If you need help quitting, ask your health care provider.  Limit alcohol intake to no more than 2 drinks per day. One drink equals 12 ounces of beer, 5 ounces of wine, or 1 ounces of hard liquor.  Do not use street drugs.  Do not share needles.  Ask your health care provider for help if you need support or information about quitting drugs.  Tell your health care provider if you often feel depressed.  Tell your health care provider if you have ever been abused or do not feel safe at home. This information is not intended to replace advice given to you by your health care provider. Make sure you discuss any questions you have with your health care provider. Document Released: 11/11/2007 Document Revised: 01/12/2016 Document Reviewed: 02/16/2015 Elsevier Interactive Patient Education  [employer].

## 2017-02-08 NOTE — Progress Notes (Signed)
Subjective:    Patient ID: Joshua Escobar, male    DOB: 06-Jul-1970, 46 y.o.   MRN: [id number]  Chief Complaint  Patient presents with  . CPE    not fasting    HPI:  Joshua Escobar is a 46 y.o. male who presents today for an annual wellness visit.   1) Health Maintenance -   Diet - Averages about 3 meals per day consisting of a regular diet; No caffeine intake.   Exercise -Occasional; push ups, sit-ups, walking; job is physical   2) Preventative Exams / Immunizations:  Dental -- Due for exam  Vision -- Due for exam    Health Maintenance  Topic Date Due  . FOOT EXAM  11/08/1981  . OPHTHALMOLOGY EXAM  11/08/1981  . URINE MICROALBUMIN  11/08/1981  . TETANUS/TDAP  11/09/1990  . INFLUENZA VACCINE  08/26/2017 (Originally 12/27/2016)  . HEMOGLOBIN A1C  08/01/2017  . PNEUMOCOCCAL POLYSACCHARIDE VACCINE (2) 03/29/2021  . HIV Screening  Completed     There is no immunization history on file for this patient.   No Known Allergies   Outpatient Medications Prior to Visit  Medication Sig Dispense Refill  . blood glucose meter kit and supplies Dispense based on patient and insurance preference. Use up to four times daily as directed. (FOR ICD-9 250.00, 250.01). 1 each 0  . dicyclomine (BENTYL) 10 MG capsule Take 1 capsule (10 mg total) by mouth 3 (three) times daily before meals. 60 capsule 0  . gabapentin (NEURONTIN) 100 MG capsule Take 1 capsule (100 mg total) by mouth at bedtime. 30 capsule 0  . Insulin Pen Needle 31G X 5 MM MISC Use 4 times daily with insulin 120 each 0  . Multiple Vitamin (MULTIVITAMIN WITH MINERALS) TABS tablet Take 1 tablet by mouth daily.    Marland Kitchen omeprazole (PRILOSEC) 40 MG capsule Take 1 capsule (40 mg total) by mouth daily. 30 capsule 3  . ondansetron (ZOFRAN ODT) 8 MG disintegrating tablet 31m ODT q4 hours prn nausea (Patient taking differently: Take 8 mg by mouth every 4 (four) hours as needed for nausea. 855mODT q4 hours prn nausea) 4 tablet 0  .  HYDROcodone-acetaminophen (NORCO/VICODIN) 5-325 MG tablet Take 1-2 tablets by mouth every 6 (six) hours as needed. 6 tablet 0  . ibuprofen (ADVIL,MOTRIN) 200 MG tablet Take 400 mg by mouth every 6 (six) hours as needed for headache or mild pain.    . Marland Kitchennsulin aspart (NOVOLOG FLEXPEN) 100 UNIT/ML FlexPen Inject 7 Units into the skin 3 (three) times daily with meals. 6.3 mL 0  . Insulin Glargine (LANTUS SOLOSTAR) 100 UNIT/ML Solostar Pen Inject 25 Units into the skin at bedtime. (Patient taking differently: Inject 24 Units into the skin at bedtime. ) 7.5 mL 0   No facility-administered medications prior to visit.      Past Medical History:  Diagnosis Date  . Migraine headache   . Type II diabetes mellitus ([facility]     Past Surgical History:  Procedure Laterality Date  . LEFT HEART CATH AND CORONARY ANGIOGRAPHY N/A 11/27/2016   Procedure: Left Heart Cath and Coronary Angiography;  Surgeon: BeLorretta HarpMD;  [facility];  Service: Cardiovascular;  Laterality: N/A;  . NO PAST SURGERIES       Family History  Problem Relation Age of Onset  . Diabetes Mellitus II Sister   . Diabetes Mellitus II Brother   . Diabetes Mellitus II Mother   . Diabetes Mellitus II  Father      Social History   Social History  . Marital status: Divorced    Spouse name: N/A  . Number of children: 2  . Years of education: 12   Occupational History  . Engineer, civil (consulting)    Social History Main Topics  . Smoking status: Current Every Day Smoker    Packs/day: 0.50    Years: 20.00    Types: Cigarettes  . Smokeless tobacco: Never Used  . Alcohol use 3.6 oz/week    6 Cans of beer per week  . Drug use: No  . Sexual activity: Not Currently   Other Topics Concern  . Not on file   Social History Narrative   Fun/Hobby: Basketball, fish, bowling.          Review of Systems  Constitutional: Denies fever, chills, fatigue, or significant weight gain/loss. HENT: Head: Denies headache  or neck pain Ears: Denies changes in hearing, ringing in ears, earache, drainage Nose: Denies discharge, stuffiness, itching, nosebleed, sinus pain Throat: Denies sore throat, hoarseness, dry mouth, sores, thrush Eyes: Denies loss/changes in vision, pain, redness, blurry/double vision, flashing lights Cardiovascular: Denies chest pain/discomfort, tightness, palpitations, shortness of breath with activity, difficulty lying down, swelling, sudden awakening with shortness of breath Respiratory: Denies shortness of breath, cough, sputum production, wheezing Gastrointestinal: Denies dysphasia, heartburn, change in appetite, nausea, change in bowel habits, rectal bleeding, constipation, diarrhea, yellow skin or eyes Genitourinary: Denies frequency, urgency, burning/pain, blood in urine, incontinence, change in urinary strength. Musculoskeletal: Denies muscle/joint pain, stiffness, back pain, redness or swelling of joints, trauma Skin: Denies rashes, lumps, itching, dryness, color changes, or hair/nail changes Neurological: Denies dizziness, fainting, seizures, weakness, numbness, tingling, tremor Psychiatric - Denies nervousness, stress, depression or memory loss Endocrine: Denies heat or cold intolerance, sweating, frequent urination, excessive thirst, changes in appetite Hematologic: Denies ease of bruising or bleeding     Objective:     BP 134/88 (BP Location: Left Arm, Patient Position: Sitting, Cuff Size: Normal)   Pulse 96   Temp 98.9 F (37.2 C) (Oral)   Resp 16   Ht '5\' 8"'  (1.727 m)   Wt 157 lb 6.4 oz (71.4 kg)   SpO2 96%   BMI 23.93 kg/m  Nursing note and vital signs reviewed.  Physical Exam  Constitutional: He is oriented to person, place, and time. He appears well-developed and well-nourished.  HENT:  Head: Normocephalic.  Right Ear: Hearing, tympanic membrane, external ear and ear canal normal.  Left Ear: Hearing, tympanic membrane, external ear and ear canal normal.  Nose:  Nose normal.  Mouth/Throat: Uvula is midline, oropharynx is clear and moist and mucous membranes are normal.  Eyes: Pupils are equal, round, and reactive to light. Conjunctivae and EOM are normal.  Neck: Neck supple. No JVD present. No tracheal deviation present. No thyromegaly present.  Cardiovascular: Normal rate, regular rhythm, normal heart sounds and intact distal pulses.   Pulmonary/Chest: Effort normal and breath sounds normal.  Abdominal: Soft. Bowel sounds are normal. He exhibits no distension and no mass. There is no tenderness. There is no rebound and no guarding.  Musculoskeletal: Normal range of motion. He exhibits no edema or tenderness.  Lymphadenopathy:    He has no cervical adenopathy.  Neurological: He is alert and oriented to person, place, and time. He has normal reflexes. No cranial nerve deficit. He exhibits normal muscle tone. Coordination normal.  Skin: Skin is warm and dry.  Psychiatric: He has a normal mood and affect. His behavior is  normal. Judgment and thought content normal.       Assessment & Plan:   Problem List Items Addressed This Visit      Endocrine   Uncontrolled type 2 diabetes mellitus without complication, with long-term current use of insulin ([place])   Relevant Orders   Ambulatory referral to Ophthalmology     Other   Routine adult health maintenance - Primary    1) Anticipatory Guidance: Discussed importance of wearing a seatbelt while driving and not texting while driving; changing batteries in smoke detector at least once annually; wearing suntan lotion when outside; eating a balanced and moderate diet; getting physical activity at least 30 minutes per day.  2) Immunizations / Screenings / Labs:  Declines influenza with Pneumovax up-to-date. Discussed plan for tetanus. All other immunizations are up-to-date per recommendations. Due for dental exam encouraged to be completed independently. Due for diabetic eye exam with referral to ophthalmology  placed. All other screenings are up-to-date per recommendations. Obtain lipid profile and urine microalbumin. Previous blood work reviewed.  Overall well exam with risk factors for cardiovascular disease including hypertension, hyperlipidemia, and type 2 diabetes. Blood pressure appears adequately controlled current medication regimen. Continue lifestyle management of hyperlipidemia pending lipid profile results. Type 2 diabetes remains poorly controlled with most recent A1c of 9.3. Encouraged to continue healthy lifestyle behaviors and choices. Follow-up prevention exam in 1 year. Follow-up office visit pending blood work and for chronic conditions.      Relevant Orders   Lipid Profile   Urine Microalbumin w/creat. ratio       I have discontinued Mr. Oelkers HYDROcodone-acetaminophen and ibuprofen. I am also having him start on doxycycline. Additionally, I am having him maintain his Insulin Pen Needle, blood glucose meter kit and supplies, insulin aspart, Insulin Glargine, multivitamin with minerals, ondansetron, gabapentin, omeprazole, and dicyclomine.   Meds ordered this encounter  Medications  . doxycycline (VIBRA-TABS) 100 MG tablet    Sig: Take 1 tablet (100 mg total) by mouth 2 (two) times daily.    Dispense:  20 tablet    Refill:  0    Order Specific Question:   Supervising Provider    Answer:   Pricilla Holm A [[id number]]     Follow-up: Return in about 3 months (around 05/10/2017), or if symptoms worsen or fail to improve.   Mauricio Po, FNP

## 2017-02-08 NOTE — Assessment & Plan Note (Signed)
1) Anticipatory Guidance: Discussed importance of wearing a seatbelt while driving and not texting while driving; changing batteries in smoke detector at least once annually; wearing suntan lotion when outside; eating a balanced and moderate diet; getting physical activity at least 30 minutes per day.  2) Immunizations / Screenings / Labs:  Declines influenza with Pneumovax up-to-date. Discussed plan for tetanus. All other immunizations are up-to-date per recommendations. Due for dental exam encouraged to be completed independently. Due for diabetic eye exam with referral to ophthalmology placed. All other screenings are up-to-date per recommendations. Obtain lipid profile and urine microalbumin. Previous blood work reviewed.  Overall well exam with risk factors for cardiovascular disease including hypertension, hyperlipidemia, and type 2 diabetes. Blood pressure appears adequately controlled current medication regimen. Continue lifestyle management of hyperlipidemia pending lipid profile results. Type 2 diabetes remains poorly controlled with most recent A1c of 9.3. Encouraged to continue healthy lifestyle behaviors and choices. Follow-up prevention exam in 1 year. Follow-up office visit pending blood work and for chronic conditions.

## 2017-02-13 ENCOUNTER — Encounter: Payer: Self-pay | Admitting: Gastroenterology

## 2017-02-14 ENCOUNTER — Telehealth: Payer: Self-pay | Admitting: Family

## 2017-02-14 NOTE — Telephone Encounter (Signed)
Forms received and completed

## 2017-02-14 NOTE — Telephone Encounter (Signed)
FMLA forms received. Patient is requesting forms to be filled out for the time spent in the hospital. 01-30-17 to 02-02-17, He went back to work on 02/05/17. He is also requesting intermitting FMLA at less 3 days a month for the doctors appointments. Forms have been giving to PCP to advise. Thank you.

## 2017-02-15 ENCOUNTER — Encounter (HOSPITAL_COMMUNITY): Payer: Self-pay | Admitting: Emergency Medicine

## 2017-02-15 ENCOUNTER — Ambulatory Visit (HOSPITAL_COMMUNITY)
Admission: EM | Admit: 2017-02-15 | Discharge: 2017-02-15 | Disposition: A | Discharge status: Home / Self Care | Payer: BLUE CROSS/BLUE SHIELD | Attending: Family Medicine | Admitting: Family Medicine

## 2017-02-15 DIAGNOSIS — L02211 Cutaneous abscess of abdominal wall: Secondary | ICD-10-CM | POA: Diagnosis not present

## 2017-02-15 DIAGNOSIS — Z0279 Encounter for issue of other medical certificate: Secondary | ICD-10-CM

## 2017-02-15 DIAGNOSIS — L0291 Cutaneous abscess, unspecified: Secondary | ICD-10-CM

## 2017-02-15 NOTE — Telephone Encounter (Signed)
I have charged for the forms.

## 2017-02-15 NOTE — Discharge Instructions (Signed)
Continue the doxycycline, return two days

## 2017-02-15 NOTE — Telephone Encounter (Signed)
Called pt to let him know paper work has been completed. Pt wants paperwork faxed and did NOT want a copy. He stated just to put it in his chart. Paper work will be sent to scan to put in chart.

## 2017-02-15 NOTE — ED Provider Notes (Signed)
[place]    CSN: [id number] Arrival date & time: 02/15/17  1008     History   Chief Complaint Chief Complaint  Patient presents with  . Abscess    HPI Joshua Escobar is a 46 y.o. male.   Patient says he has a "cyst" on lower abdomen.  Saw pcp last week and was prescribed an antibiotic-doxycycline.  Says "cyst" is getting bigger.    Patient has had chronic swelling there but when he started getting more tender last week, he sought care from his primary care provider.      Past Medical History:  Diagnosis Date  . Migraine headache   . Type II diabetes mellitus [facility])     Patient Active Problem List   Diagnosis Date Noted  . Routine adult health maintenance 02/08/2017  . Atypical chest pain 02/01/2017  . Back pain 12/28/2016  . Diabetic neuropathy ([place]) 12/28/2016  . Generalized abdominal pain 12/28/2016  . Chest pain   . Unstable angina ([place]) 11/25/2016  . Abnormal ECG   . DKA, type 2 ([place]) 10/03/2016  . Acute kidney injury ([place]) 10/03/2016  . HTN (hypertension) 10/03/2016  . Nausea & vomiting 10/03/2016  . HLD (hyperlipidemia) 10/03/2016  . Demand ischemia ([place])   . AKI (acute kidney injury) ([place])   . Uncontrolled type 2 diabetes mellitus without complication, with long-term current use of insulin ([place]) 07/04/2013    Past Surgical History:  Procedure Laterality Date  . LEFT HEART CATH AND CORONARY ANGIOGRAPHY N/A 11/27/2016   Procedure: Left Heart Cath and Coronary Angiography;  Surgeon: Lorretta Harp, MD;  Location: [place] CV [facility];  Service: Cardiovascular;  Laterality: N/A;  . NO PAST SURGERIES         Home Medications    Prior to Admission medications   Medication Sig Start Date End Date Taking? Authorizing Provider  blood glucose meter kit and supplies Dispense based on patient and insurance preference. Use up to four times daily as directed. (FOR ICD-9 250.00, 250.01). 10/05/16   Dessa Phi Chahn-Yang, DO  dicyclomine  (BENTYL) 10 MG capsule Take 1 capsule (10 mg total) by mouth 3 (three) times daily before meals. 02/01/17   Golden Circle, FNP  doxycycline (VIBRA-TABS) 100 MG tablet Take 1 tablet (100 mg total) by mouth 2 (two) times daily. 02/08/17   Golden Circle, FNP  gabapentin (NEURONTIN) 100 MG capsule Take 1 capsule (100 mg total) by mouth at bedtime. 12/28/16   Golden Circle, FNP  insulin aspart (NOVOLOG FLEXPEN) 100 UNIT/ML FlexPen Inject 7 Units into the skin 3 (three) times daily with meals. 10/31/16 01/30/17  Golden Circle, FNP  Insulin Glargine (LANTUS SOLOSTAR) 100 UNIT/ML Solostar Pen Inject 25 Units into the skin at bedtime. Patient taking differently: Inject 24 Units into the skin at bedtime.  10/31/16 01/30/17  Golden Circle, FNP  Insulin Pen Needle 31G X 5 MM MISC Use 4 times daily with insulin 10/05/16   Dessa Phi Chahn-Yang, DO  Multiple Vitamin (MULTIVITAMIN WITH MINERALS) TABS tablet Take 1 tablet by mouth daily.    [provider]  omeprazole (PRILOSEC) 40 MG capsule Take 1 capsule (40 mg total) by mouth daily. 02/01/17   Golden Circle, FNP  ondansetron (ZOFRAN ODT) 8 MG disintegrating tablet 42m ODT q4 hours prn nausea Patient taking differently: Take 8 mg by mouth every 4 (four) hours as needed for nausea. 871mODT q4 hours prn nausea 12/24/16   Muthersbaugh, HaJarrett SohoPA-C  Family History Family History  Problem Relation Age of Onset  . Diabetes Mellitus II Sister   . Diabetes Mellitus II Brother   . Diabetes Mellitus II Mother   . Diabetes Mellitus II Father     Social History Social History  Substance Use Topics  . Smoking status: Current Every Day Smoker    Packs/day: 0.50    Years: 20.00    Types: Cigarettes  . Smokeless tobacco: Never Used  . Alcohol use 3.6 oz/week    6 Cans of beer per week     Allergies   Patient has no known allergies.   Review of Systems Review of Systems   Physical Exam Triage Vital Signs ED Triage Vitals  Enc  Vitals Group     BP 02/15/17 1109 133/87     Pulse Rate 02/15/17 1109 100     Resp 02/15/17 1109 (!) 1     Temp 02/15/17 1109 99.3 F (37.4 C)     Temp Source 02/15/17 1109 Oral     SpO2 02/15/17 1109 100 %     Weight --      Height --      Head Circumference --      Peak Flow --      Pain Score 02/15/17 1107 10     Pain Loc --      Pain Edu? --      Excl. in [place]? --    No data found.   Updated Vital Signs BP 133/87 (BP Location: Left Arm)   Pulse 100   Temp 99.3 F (37.4 C) (Oral)   Resp (!) 1   SpO2 100%    Physical Exam  Constitutional: He appears well-developed and well-nourished.  Skin:  2 cm round, tender subcutaneous mass at the superior margin of the pubic escutcheon  Nursing note and vitals reviewed.    UC Treatments / Results  Labs (all labs ordered are listed, but only abnormal results are displayed) Labs Reviewed - No data to display  EKG  EKG Interpretation None       Radiology No results found.  Procedures .Marland KitchenIncision and Drainage Date/Time: 02/15/2017 11:29 AM Performed by: Robyn Haber Authorized by: Robyn Haber   Consent:    Consent obtained:  Verbal   Consent given by:  Patient   Risks discussed:  Infection   Alternatives discussed:  No treatment Location:    Type:  Abscess   Location:  Trunk   Trunk location:  Abdomen Pre-procedure details:    Skin preparation:  Betadine Anesthesia (see MAR for exact dosages):    Anesthesia method:  Local infiltration   Local anesthetic:  Lidocaine 2% WITH epi Procedure type:    Complexity:  Simple Procedure details:    Needle aspiration: no     Incision types:  Stab incision   Incision depth:  Subcutaneous   Scalpel blade:  11   Wound management:  Probed and deloculated   Drainage:  Purulent and bloody   Drainage amount:  Copious   Wound treatment:  Drain placed   Packing materials:  1/4 in gauze Post-procedure details:    Patient tolerance of procedure:  Tolerated well, no  immediate complications   (including critical care time)  Medications Ordered in UC Medications - No data to display   Initial Impression / Assessment and Plan / UC Course  I have reviewed the triage vital signs and the nursing notes.  Pertinent labs & imaging results that were available during my care  of the patient were reviewed by me and considered in my medical decision making (see chart for details).     Final Clinical Impressions(s) / UC Diagnoses   Final diagnoses:  Abscess    New Prescriptions New Prescriptions   No medications on file   Continue the doxycycline, return two days  Controlled Substance Prescriptions [place] Controlled Substance Registry consulted? Not Applicable   Robyn Haber, MD 02/15/17 1134

## 2017-02-15 NOTE — ED Triage Notes (Signed)
Patient says he has a "cyst" on lower abdomen.  Saw pcp last week and was prescribed an antibiotic-doxycycline.  Says "cyst" is getting bigger.

## 2017-02-17 ENCOUNTER — Ambulatory Visit (HOSPITAL_COMMUNITY)
Admission: EM | Admit: 2017-02-17 | Discharge: 2017-02-17 | Disposition: A | Discharge status: Home / Self Care | Payer: BLUE CROSS/BLUE SHIELD | Attending: Family Medicine | Admitting: Family Medicine

## 2017-02-17 ENCOUNTER — Encounter (HOSPITAL_COMMUNITY): Payer: Self-pay | Admitting: Emergency Medicine

## 2017-02-17 DIAGNOSIS — L02211 Cutaneous abscess of abdominal wall: Secondary | ICD-10-CM

## 2017-02-17 DIAGNOSIS — Z5189 Encounter for other specified aftercare: Secondary | ICD-10-CM

## 2017-02-17 NOTE — ED Triage Notes (Signed)
Patient is here for packing removal.  Patient was seen 02/15/17.  Patient states he is feeling better

## 2017-02-19 NOTE — ED Provider Notes (Signed)
  [facility]   [id number] 02/17/17 Arrival Time: 1231  ASSESSMENT & PLAN:  1. Encounter for wound re-check    Discussed continuing wound care. F/U as needed. Reviewed expectations re: course of current medical issues. Questions answered. Outlined signs and symptoms indicating need for more acute intervention. Patient verbalized understanding. After Visit Summary given.   SUBJECTIVE:  Joshua Escobar is a 46 y.o. male who follows up s/p I&D of lower abdomen. No complaints. Wound check. Packing present. No fever. No pain.  ROS: As per HPI.   OBJECTIVE:  Vitals:   02/17/17 1415  BP: (!) 129/91  Pulse: (!) 112  Resp: 18  Temp: 98.8 F (37.1 C)  TempSrc: Oral  SpO2: 100%    General appearance: alert; no distress Abdomen: lower abdominal incision with packing present; removed without complication; healing  No Known Allergies  Past Medical History:  Diagnosis Date  . Migraine headache   . Type II diabetes mellitus (HCC)    Social History   Social History  . Marital status: Divorced    Spouse name: N/A  . Number of children: 2  . Years of education: 12   Occupational History  . Naval architect    Social History Main Topics  . Smoking status: Current Every Day Smoker    Packs/day: 0.50    Years: 20.00    Types: Cigarettes  . Smokeless tobacco: Never Used  . Alcohol use 3.6 oz/week    6 Cans of beer per week  . Drug use: No  . Sexual activity: Not Currently   Other Topics Concern  . Not on file   Social History Narrative   Fun/Hobby: Basketball, fish, bowling.       Family History  Problem Relation Age of Onset  . Diabetes Mellitus II Sister   . Diabetes Mellitus II Brother   . Diabetes Mellitus II Mother   . Diabetes Mellitus II Father    Past Surgical History:  Procedure Laterality Date  . LEFT HEART CATH AND CORONARY ANGIOGRAPHY N/A 11/27/2016   Procedure: Left Heart Cath and Coronary Angiography;  Surgeon: Runell Gess, MD;   Location: [facility] INVASIVE CV LAB;  Service: Cardiovascular;  Laterality: N/A;  . NO PAST SURGERIES       Mardella Layman, MD 02/19/17 [id number]

## 2017-03-23 ENCOUNTER — Encounter (HOSPITAL_COMMUNITY): Payer: Self-pay | Admitting: *Deleted

## 2017-03-23 ENCOUNTER — Ambulatory Visit (HOSPITAL_COMMUNITY)
Admission: EM | Admit: 2017-03-23 | Discharge: 2017-03-23 | Disposition: A | Discharge status: Home / Self Care | Payer: BLUE CROSS/BLUE SHIELD | Attending: Emergency Medicine | Admitting: Emergency Medicine

## 2017-03-23 DIAGNOSIS — M545 Low back pain, unspecified: Secondary | ICD-10-CM

## 2017-03-23 DIAGNOSIS — G8929 Other chronic pain: Secondary | ICD-10-CM | POA: Diagnosis not present

## 2017-03-23 MED ORDER — CYCLOBENZAPRINE HCL 10 MG PO TABS: 10.0000 mg | ORAL_TABLET | Freq: Every evening | ORAL | 0 refills | Status: DC | PRN

## 2017-03-23 MED ORDER — NAPROXEN 500 MG PO TABS: 500.0000 mg | ORAL_TABLET | Freq: Two times a day (BID) | ORAL | 0 refills | Status: AC

## 2017-03-23 NOTE — ED Provider Notes (Signed)
[place]    CSN: [id number] Arrival date & time: 03/23/17  1013     History   Chief Complaint Chief Complaint  Patient presents with  . Back Pain    HPI Joshua Escobar is a 46 y.o. male.   46 year old male with history of DM with diabetic neuropathy, HTN, HLD, comes in for chronic low back pain. States was sent home from work due to the pain, which requires lots of bending and some lifting. States pain is constant, worse with movement. Has not tried anything for the pain. States he isn't sleeping well due to the pain. Some numbness/tingling, though unable to tell if it is due to diabetic neuropathy. Denies radiation of pain. Denies saddle anesthesia, loss of bladder/bowel control. States DM is well control, cannot recall last a1c, morning glucose around 90's       Past Medical History:  Diagnosis Date  . Migraine headache   . Type II diabetes mellitus [facility])     Patient Active Problem List   Diagnosis Date Noted  . Routine adult health maintenance 02/08/2017  . Atypical chest pain 02/01/2017  . Back pain 12/28/2016  . Diabetic neuropathy ([place]) 12/28/2016  . Generalized abdominal pain 12/28/2016  . Chest pain   . Unstable angina ([place]) 11/25/2016  . Abnormal ECG   . DKA, type 2 ([place]) 10/03/2016  . Acute kidney injury ([place]) 10/03/2016  . HTN (hypertension) 10/03/2016  . Nausea & vomiting 10/03/2016  . HLD (hyperlipidemia) 10/03/2016  . Demand ischemia ([place])   . AKI (acute kidney injury) ([place])   . Uncontrolled type 2 diabetes mellitus without complication, with long-term current use of insulin ([place]) 07/04/2013    Past Surgical History:  Procedure Laterality Date  . LEFT HEART CATH AND CORONARY ANGIOGRAPHY N/A 11/27/2016   Procedure: Left Heart Cath and Coronary Angiography;  Surgeon: Lorretta Harp, MD;  Location: [place] CV [facility];  Service: Cardiovascular;  Laterality: N/A;  . NO PAST SURGERIES         Home Medications    Prior to  Admission medications   Medication Sig Start Date End Date Taking? Authorizing Provider  blood glucose meter kit and supplies Dispense based on patient and insurance preference. Use up to four times daily as directed. (FOR ICD-9 250.00, 250.01). 10/05/16   Dessa Phi Chahn-Yang, DO  cyclobenzaprine (FLEXERIL) 10 MG tablet Take 1 tablet (10 mg total) by mouth at bedtime as needed for muscle spasms. 03/23/17   Tasia Catchings, Amy V, PA-C  dicyclomine (BENTYL) 10 MG capsule Take 1 capsule (10 mg total) by mouth 3 (three) times daily before meals. 02/01/17   Golden Circle, FNP  doxycycline (VIBRA-TABS) 100 MG tablet Take 1 tablet (100 mg total) by mouth 2 (two) times daily. 02/08/17   Golden Circle, FNP  gabapentin (NEURONTIN) 100 MG capsule Take 1 capsule (100 mg total) by mouth at bedtime. 12/28/16   Golden Circle, FNP  insulin aspart (NOVOLOG FLEXPEN) 100 UNIT/ML FlexPen Inject 7 Units into the skin 3 (three) times daily with meals. 10/31/16 01/30/17  Golden Circle, FNP  Insulin Glargine (LANTUS SOLOSTAR) 100 UNIT/ML Solostar Pen Inject 25 Units into the skin at bedtime. Patient taking differently: Inject 24 Units into the skin at bedtime.  10/31/16 01/30/17  Golden Circle, FNP  Insulin Pen Needle 31G X 5 MM MISC Use 4 times daily with insulin 10/05/16   Dessa Phi Chahn-Yang, DO  Multiple Vitamin (MULTIVITAMIN WITH MINERALS) TABS tablet  Take 1 tablet by mouth daily.    [provider]  naproxen (NAPROSYN) 500 MG tablet Take 1 tablet (500 mg total) by mouth 2 (two) times daily. 03/23/17 04/02/17  Ok Edwards, PA-C  omeprazole (PRILOSEC) 40 MG capsule Take 1 capsule (40 mg total) by mouth daily. 02/01/17   Golden Circle, FNP  ondansetron (ZOFRAN ODT) 8 MG disintegrating tablet 21m ODT q4 hours prn nausea Patient taking differently: Take 8 mg by mouth every 4 (four) hours as needed for nausea. 858mODT q4 hours prn nausea 12/24/16   Muthersbaugh, HaJarrett SohoPA-C    Family History Family History    Problem Relation Age of Onset  . Diabetes Mellitus II Sister   . Diabetes Mellitus II Brother   . Diabetes Mellitus II Mother   . Diabetes Mellitus II Father     Social History Social History  Substance Use Topics  . Smoking status: Current Every Day Smoker    Packs/day: 0.50    Years: 20.00    Types: Cigarettes  . Smokeless tobacco: Never Used  . Alcohol use 3.6 oz/week    6 Cans of beer per week     Allergies   Patient has no known allergies.   Review of Systems Review of Systems  Reason unable to perform ROS: See HPI as above.     Physical Exam Triage Vital Signs ED Triage Vitals  Enc Vitals Group     BP 03/23/17 1027 125/86     Pulse Rate 03/23/17 1027 96     Resp 03/23/17 1027 16     Temp 03/23/17 1027 98.9 F (37.2 C)     Temp Source 03/23/17 1027 Oral     SpO2 03/23/17 1027 97 %     Weight --      Height --      Head Circumference --      Peak Flow --      Pain Score 03/23/17 1028 8     Pain Loc --      Pain Edu? --      [place]cl. in [place]--    No data found.   Updated Vital Signs BP 125/86 (BP Location: Left Arm)   Pulse 96   Temp 98.9 F (37.2 C) (Oral)   Resp 16   SpO2 97%   Physical Exam  Constitutional: He is oriented to person, place, and time. He appears well-developed and well-nourished. No distress.  HENT:  Head: Normocephalic and atraumatic.  Eyes: Pupils are equal, round, and reactive to light. Conjunctivae are normal.  Cardiovascular: Normal rate, regular rhythm and normal heart sounds.  Exam reveals no gallop and no friction rub.   No murmur heard. Pulmonary/Chest: Effort normal and breath sounds normal. He has no wheezes. He has no rales.  Musculoskeletal:  Diffuse tenderness on palpation of back. Max tenderness around lumbar region. Full range of motion of back and hips. Strength normal and equal bilaterally. Sensation intact and equal bilaterally. Negative straight leg raise.  Patient can move on and off exam table without  difficulty. Normal gait  Neurological: He is alert and oriented to person, place, and time.  Skin: Skin is warm and dry.     UC Treatments / Results  Labs (all labs ordered are listed, but only abnormal results are displayed) Labs Reviewed - No data to display  EKG  EKG Interpretation None       Radiology No results found.  Procedures Procedures (including critical care time)  Medications Ordered in UC Medications - No data to display   Initial Impression / Assessment and Plan / UC Course  I have reviewed the triage vital signs and the nursing notes.  Pertinent labs & imaging results that were available during my care of the patient were reviewed by me and considered in my medical decision making (see chart for details).    Patient has not tried anything for pain. Start NSAID as directed for pain and inflammation. Muscle relaxant as needed. Ice/heat compresses. Back brace during activity. Follow up with PCP as scheduled for further management. Return precautions given.   Final Clinical Impressions(s) / UC Diagnoses   Final diagnoses:  Chronic bilateral low back pain without sciatica    New Prescriptions New Prescriptions   CYCLOBENZAPRINE (FLEXERIL) 10 MG TABLET    Take 1 tablet (10 mg total) by mouth at bedtime as needed for muscle spasms.   NAPROXEN (NAPROSYN) 500 MG TABLET    Take 1 tablet (500 mg total) by mouth 2 (two) times daily.     Ok Edwards, PA-C 03/23/17 1119

## 2017-03-23 NOTE — ED Triage Notes (Addendum)
Patient reports back pain, states it has been going on for a while. Patient points to lower back in the middle, non radiating. Denies any injury. Patient assembles ATMs, does a lot of bending.

## 2017-03-23 NOTE — Discharge Instructions (Signed)
Start Naproxen as directed. Flexeril as needed at night. Flexeril can make you drowsy, so do not take if you are going to drive, operate heavy machinery, or make important decisions. Ice/heat compresses as needed. Wear back brace during activity. Follow up with PCP for further management of chronic back pain. If experience numbness/tingling of the inner thighs, loss of bladder or bowel control, go to the emergency department for evaluation.

## 2017-03-26 ENCOUNTER — Other Ambulatory Visit: Payer: Self-pay | Admitting: Gastroenterology

## 2017-03-26 DIAGNOSIS — K5900 Constipation, unspecified: Secondary | ICD-10-CM | POA: Diagnosis not present

## 2017-03-26 DIAGNOSIS — R194 Change in bowel habit: Secondary | ICD-10-CM | POA: Diagnosis not present

## 2017-03-26 DIAGNOSIS — R1310 Dysphagia, unspecified: Secondary | ICD-10-CM

## 2017-03-26 DIAGNOSIS — R1084 Generalized abdominal pain: Secondary | ICD-10-CM | POA: Diagnosis not present

## 2017-03-26 DIAGNOSIS — R1319 Other dysphagia: Secondary | ICD-10-CM

## 2017-03-27 DIAGNOSIS — E119 Type 2 diabetes mellitus without complications: Secondary | ICD-10-CM | POA: Diagnosis not present

## 2017-03-27 DIAGNOSIS — H04123 Dry eye syndrome of bilateral lacrimal glands: Secondary | ICD-10-CM | POA: Diagnosis not present

## 2017-04-02 ENCOUNTER — Ambulatory Visit
Admission: RE | Admit: 2017-04-02 | Discharge: 2017-04-02 | Disposition: A | Discharge status: Home / Self Care | Payer: BLUE CROSS/BLUE SHIELD | Source: Ambulatory Visit | Attending: Gastroenterology | Admitting: Gastroenterology

## 2017-04-02 DIAGNOSIS — R1310 Dysphagia, unspecified: Secondary | ICD-10-CM

## 2017-04-02 DIAGNOSIS — R1319 Other dysphagia: Secondary | ICD-10-CM

## 2017-04-03 ENCOUNTER — Encounter: Payer: Self-pay | Admitting: Family

## 2017-04-03 DIAGNOSIS — K648 Other hemorrhoids: Secondary | ICD-10-CM | POA: Diagnosis not present

## 2017-04-03 DIAGNOSIS — R194 Change in bowel habit: Secondary | ICD-10-CM | POA: Diagnosis not present

## 2017-04-05 ENCOUNTER — Ambulatory Visit: Payer: BLUE CROSS/BLUE SHIELD | Admitting: Gastroenterology

## 2017-04-05 DIAGNOSIS — R194 Change in bowel habit: Secondary | ICD-10-CM | POA: Diagnosis not present

## 2017-05-15 ENCOUNTER — Other Ambulatory Visit: Payer: Self-pay

## 2017-05-15 ENCOUNTER — Encounter (HOSPITAL_COMMUNITY): Payer: Self-pay

## 2017-05-15 ENCOUNTER — Observation Stay (HOSPITAL_COMMUNITY)
Admission: EM | Admit: 2017-05-15 | Discharge: 2017-05-17 | DRG: 638 | Disposition: A | Discharge status: Home / Self Care | Payer: BLUE CROSS/BLUE SHIELD | Attending: Family Medicine | Admitting: Family Medicine

## 2017-05-15 DIAGNOSIS — IMO0001 Reserved for inherently not codable concepts without codable children: Secondary | ICD-10-CM

## 2017-05-15 DIAGNOSIS — E1110 Type 2 diabetes mellitus with ketoacidosis without coma: Secondary | ICD-10-CM | POA: Diagnosis not present

## 2017-05-15 DIAGNOSIS — E785 Hyperlipidemia, unspecified: Secondary | ICD-10-CM | POA: Diagnosis present

## 2017-05-15 DIAGNOSIS — I10 Essential (primary) hypertension: Secondary | ICD-10-CM | POA: Diagnosis present

## 2017-05-15 DIAGNOSIS — E1142 Type 2 diabetes mellitus with diabetic polyneuropathy: Secondary | ICD-10-CM | POA: Diagnosis not present

## 2017-05-15 DIAGNOSIS — K529 Noninfective gastroenteritis and colitis, unspecified: Secondary | ICD-10-CM | POA: Diagnosis not present

## 2017-05-15 DIAGNOSIS — R112 Nausea with vomiting, unspecified: Secondary | ICD-10-CM | POA: Diagnosis not present

## 2017-05-15 DIAGNOSIS — Z79899 Other long term (current) drug therapy: Secondary | ICD-10-CM

## 2017-05-15 DIAGNOSIS — E1140 Type 2 diabetes mellitus with diabetic neuropathy, unspecified: Secondary | ICD-10-CM | POA: Diagnosis present

## 2017-05-15 DIAGNOSIS — R1084 Generalized abdominal pain: Secondary | ICD-10-CM | POA: Diagnosis present

## 2017-05-15 DIAGNOSIS — F17210 Nicotine dependence, cigarettes, uncomplicated: Secondary | ICD-10-CM | POA: Diagnosis not present

## 2017-05-15 DIAGNOSIS — N179 Acute kidney failure, unspecified: Secondary | ICD-10-CM | POA: Diagnosis not present

## 2017-05-15 DIAGNOSIS — Z794 Long term (current) use of insulin: Secondary | ICD-10-CM

## 2017-05-15 DIAGNOSIS — R197 Diarrhea, unspecified: Secondary | ICD-10-CM | POA: Diagnosis not present

## 2017-05-15 DIAGNOSIS — E1165 Type 2 diabetes mellitus with hyperglycemia: Secondary | ICD-10-CM

## 2017-05-15 DIAGNOSIS — R9431 Abnormal electrocardiogram [ECG] [EKG]: Secondary | ICD-10-CM | POA: Diagnosis not present

## 2017-05-15 DIAGNOSIS — E871 Hypo-osmolality and hyponatremia: Secondary | ICD-10-CM | POA: Diagnosis not present

## 2017-05-15 DIAGNOSIS — R109 Unspecified abdominal pain: Secondary | ICD-10-CM | POA: Diagnosis present

## 2017-05-15 LAB — LIPASE, BLOOD: Lipase: 25 U/L (ref 11–51)

## 2017-05-15 LAB — BASIC METABOLIC PANEL
Anion gap: 11 (ref 5–15)
Anion gap: 7 (ref 5–15)
BUN: 13 mg/dL (ref 6–20)
BUN: 12 mg/dL (ref 6–20)
CO2: 20 mmol/L — ABNORMAL LOW (ref 22–32)
CO2: 24 mmol/L (ref 22–32)
Calcium: 9.8 mg/dL (ref 8.9–10.3)
Calcium: 9.4 mg/dL (ref 8.9–10.3)
Chloride: 106 mmol/L (ref 101–111)
Chloride: 105 mmol/L (ref 101–111)
Creatinine, Ser: 0.75 mg/dL (ref 0.61–1.24)
Creatinine, Ser: 0.72 mg/dL (ref 0.61–1.24)
GFR calc Af Amer: >60 mL/min (ref >60)
GFR calc Af Amer: >60 mL/min (ref >60)
GFR calc non Af Amer: >60 mL/min (ref >60)
GFR calc non Af Amer: >60 mL/min (ref >60)
Glucose, Bld: 222 mg/dL — ABNORMAL HIGH (ref 65–99)
Glucose, Bld: 121 mg/dL — ABNORMAL HIGH (ref 65–99)
Potassium: 4.1 mmol/L (ref 3.5–5.1)
Potassium: 4 mmol/L (ref 3.5–5.1)
Sodium: 137 mmol/L (ref 135–145)
Sodium: 136 mmol/L (ref 135–145)

## 2017-05-15 LAB — BLOOD GAS, VENOUS
Acid-base deficit: 14 mmol/L — ABNORMAL HIGH (ref 0.0–2.0)
Bicarbonate: 11.9 mmol/L — ABNORMAL LOW (ref 20.0–28.0)
O2 Saturation: 83.8 %
Patient temperature: 98.6
pCO2, Ven: 28.5 mmHg — ABNORMAL LOW (ref 44.0–60.0)
pH, Ven: 7.245 — ABNORMAL LOW (ref 7.250–7.430)
pO2, Ven: 57 mmHg — ABNORMAL HIGH (ref 32.0–45.0)

## 2017-05-15 LAB — URINALYSIS, ROUTINE W REFLEX MICROSCOPIC
Bilirubin Urine: NEGATIVE
Glucose, UA: >=500 mg/dL — AB
Hgb urine dipstick: NEGATIVE
Ketones, ur: 80 mg/dL — AB
Leukocytes, UA: NEGATIVE
Nitrite: NEGATIVE
Protein, ur: 30 mg/dL — AB
Specific Gravity, Urine: 1.024 (ref 1.005–1.030)
Squamous Epithelial / LPF: NONE SEEN
pH: 5 (ref 5.0–8.0)

## 2017-05-15 LAB — CBC
HCT: 49.2 % (ref 39.0–52.0)
Hemoglobin: 16.3 g/dL (ref 13.0–17.0)
MCH: 27.1 pg (ref 26.0–34.0)
MCHC: 33.1 g/dL (ref 30.0–36.0)
MCV: 81.7 fL (ref 78.0–100.0)
Platelets: 300 10*3/uL (ref 150–400)
RBC: 6.02 MIL/uL — ABNORMAL HIGH (ref 4.22–5.81)
RDW: 13.7 % (ref 11.5–15.5)
WBC: 8.5 10*3/uL (ref 4.0–10.5)

## 2017-05-15 LAB — COMPREHENSIVE METABOLIC PANEL
ALT: 21 U/L (ref 17–63)
AST: 20 U/L (ref 15–41)
Albumin: 4.9 g/dL (ref 3.5–5.0)
Alkaline Phosphatase: 107 U/L (ref 38–126)
Anion gap: 25 — ABNORMAL HIGH (ref 5–15)
BUN: 16 mg/dL (ref 6–20)
CO2: 13 mmol/L — ABNORMAL LOW (ref 22–32)
Calcium: 10.5 mg/dL — ABNORMAL HIGH (ref 8.9–10.3)
Chloride: 94 mmol/L — ABNORMAL LOW (ref 101–111)
Creatinine, Ser: 1.2 mg/dL (ref 0.61–1.24)
GFR calc Af Amer: >60 mL/min (ref >60)
GFR calc non Af Amer: >60 mL/min (ref >60)
Glucose, Bld: 398 mg/dL — ABNORMAL HIGH (ref 65–99)
Potassium: 4.8 mmol/L (ref 3.5–5.1)
Sodium: 132 mmol/L — ABNORMAL LOW (ref 135–145)
Total Bilirubin: 1.7 mg/dL — ABNORMAL HIGH (ref 0.3–1.2)
Total Protein: 8.8 g/dL — ABNORMAL HIGH (ref 6.5–8.1)

## 2017-05-15 LAB — CBG MONITORING, ED
Glucose-Capillary: 362 mg/dL — ABNORMAL HIGH (ref 65–99)
Glucose-Capillary: 320 mg/dL — ABNORMAL HIGH (ref 65–99)
Glucose-Capillary: 283 mg/dL — ABNORMAL HIGH (ref 65–99)
Glucose-Capillary: 247 mg/dL — ABNORMAL HIGH (ref 65–99)
Glucose-Capillary: 201 mg/dL — ABNORMAL HIGH (ref 65–99)
Glucose-Capillary: 200 mg/dL — ABNORMAL HIGH (ref 65–99)
Glucose-Capillary: 195 mg/dL — ABNORMAL HIGH (ref 65–99)
Glucose-Capillary: 172 mg/dL — ABNORMAL HIGH (ref 65–99)
Glucose-Capillary: 141 mg/dL — ABNORMAL HIGH (ref 65–99)

## 2017-05-15 LAB — GLUCOSE, CAPILLARY
Glucose-Capillary: 108 mg/dL — ABNORMAL HIGH (ref 65–99)
Glucose-Capillary: 157 mg/dL — ABNORMAL HIGH (ref 65–99)
Glucose-Capillary: 170 mg/dL — ABNORMAL HIGH (ref 65–99)

## 2017-05-15 MED ORDER — CYCLOBENZAPRINE HCL 10 MG PO TABS: 10.0000 mg | ORAL_TABLET | Freq: Every evening | ORAL | Status: DC | PRN

## 2017-05-15 MED ORDER — INSULIN REGULAR BOLUS VIA INFUSION
0.0000 [IU] | Freq: Three times a day (TID) | INTRAVENOUS | Status: DC
  Filled 2017-05-15: qty 10

## 2017-05-15 MED ORDER — ONDANSETRON HCL 4 MG/2ML IJ SOLN: 4.0000 mg | Freq: Four times a day (QID) | INTRAMUSCULAR | Status: DC | PRN

## 2017-05-15 MED ORDER — SODIUM CHLORIDE 0.9 % IV SOLN
INTRAVENOUS | Status: DC
  Administered 2017-05-15: 8.1 [IU]/h via INTRAVENOUS
  Filled 2017-05-15: qty 1

## 2017-05-15 MED ORDER — INSULIN GLARGINE 100 UNIT/ML ~~LOC~~ SOLN
23.0000 [IU] | Freq: Every day | SUBCUTANEOUS | Status: DC
  Administered 2017-05-15 – 2017-05-16 (×2): 23 [IU] via SUBCUTANEOUS
  Filled 2017-05-15 (×4): qty 0.23

## 2017-05-15 MED ORDER — SODIUM CHLORIDE 0.9 % IV SOLN
INTRAVENOUS | Status: DC
  Administered 2017-05-15: 3 [IU]/h via INTRAVENOUS
  Filled 2017-05-15: qty 1

## 2017-05-15 MED ORDER — SODIUM CHLORIDE 0.9 % IV BOLUS (SEPSIS)
1000.0000 mL | Freq: Once | INTRAVENOUS | Status: AC
  Administered 2017-05-15: 1000 mL via INTRAVENOUS

## 2017-05-15 MED ORDER — DEXTROSE-NACL 5-0.45 % IV SOLN
INTRAVENOUS | Status: DC
  Administered 2017-05-15: 15:00:00 via INTRAVENOUS

## 2017-05-15 MED ORDER — POTASSIUM CHLORIDE 10 MEQ/100ML IV SOLN
10.0000 meq | INTRAVENOUS | Status: AC
  Administered 2017-05-15 (×2): 10 meq via INTRAVENOUS
  Filled 2017-05-15 (×2): qty 100

## 2017-05-15 MED ORDER — ONDANSETRON HCL 4 MG/2ML IJ SOLN
4.0000 mg | Freq: Once | INTRAMUSCULAR | Status: AC
  Administered 2017-05-15: 4 mg via INTRAVENOUS
  Filled 2017-05-15: qty 2

## 2017-05-15 MED ORDER — SODIUM CHLORIDE 0.9 % IV SOLN: INTRAVENOUS | Status: DC

## 2017-05-15 MED ORDER — SODIUM CHLORIDE 0.9 % IV SOLN
INTRAVENOUS | Status: DC
  Administered 2017-05-15: 22:00:00 via INTRAVENOUS

## 2017-05-15 MED ORDER — DEXTROSE-NACL 5-0.45 % IV SOLN: INTRAVENOUS | Status: DC

## 2017-05-15 MED ORDER — DEXTROSE 50 % IV SOLN: 25.0000 mL | INTRAVENOUS | Status: DC | PRN

## 2017-05-15 MED ORDER — SODIUM CHLORIDE 0.9 % IV SOLN
1000.0000 mL | INTRAVENOUS | Status: DC
  Administered 2017-05-15: 1000 mL via INTRAVENOUS

## 2017-05-15 MED ORDER — SODIUM CHLORIDE 0.45 % IV SOLN: INTRAVENOUS | Status: DC

## 2017-05-15 MED ORDER — ENOXAPARIN SODIUM 40 MG/0.4ML ~~LOC~~ SOLN
40.0000 mg | SUBCUTANEOUS | Status: DC
  Administered 2017-05-15 – 2017-05-16 (×2): 40 mg via SUBCUTANEOUS
  Filled 2017-05-15 (×3): qty 0.4

## 2017-05-15 MED ORDER — FAMOTIDINE IN NACL 20-0.9 MG/50ML-% IV SOLN
20.0000 mg | Freq: Once | INTRAVENOUS | Status: AC
  Administered 2017-05-15: 20 mg via INTRAVENOUS
  Filled 2017-05-15: qty 50

## 2017-05-15 MED ORDER — MORPHINE SULFATE (PF) 2 MG/ML IV SOLN: 2.0000 mg | INTRAVENOUS | Status: DC | PRN

## 2017-05-15 MED ORDER — GABAPENTIN 100 MG PO CAPS
100.0000 mg | ORAL_CAPSULE | Freq: Every day | ORAL | Status: DC
  Administered 2017-05-15 – 2017-05-16 (×2): 100 mg via ORAL
  Filled 2017-05-15 (×2): qty 1

## 2017-05-15 NOTE — ED Notes (Signed)
CBG 283. RN aware.

## 2017-05-15 NOTE — ED Provider Notes (Signed)
[place] DEPT Provider Note   CSN: [id number] Arrival date & time: 05/15/17  0740     History   Chief Complaint Chief Complaint  Patient presents with  . Emesis  . Abdominal Pain  . Diarrhea    HPI Joshua Escobar is a 46 y.o. male.  HPI Patient is a type II diabetic.  He reports he started getting sick 2 days ago with a frequent diarrhea.  He reports greater than 10 episodes of diarrhea per day.  He reports after developing the diarrhea, and he then developed vomiting.  He reports multiple episodes of vomiting and vomiting if he tries to eat or drink anything.  He endorses generalized abdominal pain.  Does not localize.  General aching quality.  No documented fever.  No cough or chest pain.  Patient has not been checking his blood sugar since he has been sick.  He reports he has continued to take his diabetic medications.  He denies drug use.  He reports social alcohol use on the weekends.  Daily smoking 3-4 cigarettes. Past Medical History:  Diagnosis Date  . Migraine headache   . Type II diabetes mellitus [facility])     Patient Active Problem List   Diagnosis Date Noted  . Routine adult health maintenance 02/08/2017  . Atypical chest pain 02/01/2017  . Back pain 12/28/2016  . Diabetic neuropathy ([place]) 12/28/2016  . Generalized abdominal pain 12/28/2016  . Chest pain   . Unstable angina ([place]) 11/25/2016  . Abnormal ECG   . DKA, type 2 ([place]) 10/03/2016  . Acute kidney injury ([place]) 10/03/2016  . HTN (hypertension) 10/03/2016  . Nausea & vomiting 10/03/2016  . HLD (hyperlipidemia) 10/03/2016  . Demand ischemia ([place])   . AKI (acute kidney injury) ([place])   . Uncontrolled type 2 diabetes mellitus without complication, with long-term current use of insulin ([place]) 07/04/2013    Past Surgical History:  Procedure Laterality Date  . LEFT HEART CATH AND CORONARY ANGIOGRAPHY N/A 11/27/2016   Procedure: Left Heart Cath and Coronary Angiography;   Surgeon: Lorretta Harp, MD;  Location: [place] CV LAB;  Service: Cardiovascular;  Laterality: N/A;  . NO PAST SURGERIES         Home Medications    Prior to Admission medications   Medication Sig Start Date End Date Taking? Authorizing Provider  blood glucose meter kit and supplies Dispense based on patient and insurance preference. Use up to four times daily as directed. (FOR ICD-9 250.00, 250.01). 10/05/16   Dessa Phi, DO  cyclobenzaprine (FLEXERIL) 10 MG tablet Take 1 tablet (10 mg total) by mouth at bedtime as needed for muscle spasms. 03/23/17   Tasia Catchings, Amy V, PA-C  dicyclomine (BENTYL) 10 MG capsule Take 1 capsule (10 mg total) by mouth 3 (three) times daily before meals. 02/01/17   Golden Circle, FNP  doxycycline (VIBRA-TABS) 100 MG tablet Take 1 tablet (100 mg total) by mouth 2 (two) times daily. 02/08/17   Golden Circle, FNP  gabapentin (NEURONTIN) 100 MG capsule Take 1 capsule (100 mg total) by mouth at bedtime. 12/28/16   Golden Circle, FNP  insulin aspart (NOVOLOG FLEXPEN) 100 UNIT/ML FlexPen Inject 7 Units into the skin 3 (three) times daily with meals. 10/31/16 01/30/17  Golden Circle, FNP  Insulin Glargine (LANTUS SOLOSTAR) 100 UNIT/ML Solostar Pen Inject 25 Units into the skin at bedtime. Patient taking differently: Inject 24 Units into the skin at bedtime.  10/31/16 01/30/17  Mauricio Po  D, FNP  Insulin Pen Needle 31G X 5 MM MISC Use 4 times daily with insulin 10/05/16   Dessa Phi, DO  Multiple Vitamin (MULTIVITAMIN WITH MINERALS) TABS tablet Take 1 tablet by mouth daily.    [provider]  omeprazole (PRILOSEC) 40 MG capsule Take 1 capsule (40 mg total) by mouth daily. 02/01/17   Golden Circle, FNP  ondansetron (ZOFRAN ODT) 8 MG disintegrating tablet 57m ODT q4 hours prn nausea Patient taking differently: Take 8 mg by mouth every 4 (four) hours as needed for nausea. 843mODT q4 hours prn nausea 12/24/16   Muthersbaugh, HaJarrett SohoPA-C    Family  History Family History  Problem Relation Age of Onset  . Diabetes Mellitus II Sister   . Diabetes Mellitus II Brother   . Diabetes Mellitus II Mother   . Diabetes Mellitus II Father     Social History Social History   Tobacco Use  . Smoking status: Current Every Day Smoker    Packs/day: 0.50    Years: 20.00    Pack years: 10.00    Types: Cigarettes  . Smokeless tobacco: Never Used  Substance Use Topics  . Alcohol use: Yes    Alcohol/week: 3.6 oz    Types: 6 Cans of beer per week    Comment: wekends only  . Drug use: No     Allergies   Patient has no known allergies.   Review of Systems Review of Systems 10 Systems reviewed and are negative for acute change except as noted in the HPI.   Physical Exam Updated Vital Signs BP (!) 145/98   Pulse (!) 137   Temp 98.2 F (36.8 C)   Resp (!) 24   Ht '5\' 8"'  (1.727 m)   Wt 68 kg (150 lb)   SpO2 100%   BMI 22.81 kg/m   Physical Exam  Constitutional: He is oriented to person, place, and time.  Patient is ill and uncomfortable in appearance.  Thin body habitus.  Normal mental status.  No respiratory distress.  HENT:  Head: Normocephalic and atraumatic.  Nose: Nose normal.  Mouth/Throat: Oropharynx is clear and moist.  Eyes: Conjunctivae and EOM are normal.  Neck: Neck supple.  Cardiovascular:  Tachycardia.  No rub murmur gallop.  Pulmonary/Chest: Effort normal and breath sounds normal.  Abdominal: Soft. There is tenderness.  Generally tender to palpation.  No guarding.  Abdomen is flat.  Nondistended.  Musculoskeletal: Normal range of motion. He exhibits no edema or tenderness.  No peripheral edema.  No calf tenderness.  Neurological: He is alert and oriented to person, place, and time. No cranial nerve deficit. He exhibits normal muscle tone. Coordination normal.  Skin: Skin is warm and dry.     ED Treatments / Results  Labs (all labs ordered are listed, but only abnormal results are displayed) Labs  Reviewed  COMPREHENSIVE METABOLIC PANEL - Abnormal; Notable for the following components:      Result Value   Sodium 132 (*)    Chloride 94 (*)    CO2 13 (*)    Glucose, Bld 398 (*)    Calcium 10.5 (*)    Total Protein 8.8 (*)    Total Bilirubin 1.7 (*)    Anion gap 25 (*)    All other components within normal limits  CBC - Abnormal; Notable for the following components:   RBC 6.02 (*)    All other components within normal limits  C DIFFICILE QUICK SCREEN W PCR REFLEX  LIPASE, BLOOD  URINALYSIS, ROUTINE W REFLEX MICROSCOPIC  BLOOD GAS, VENOUS    EKG  EKG Interpretation  Date/Time:  Tuesday May 15 2017 07:51:09 EST Ventricular Rate:  134 PR Interval:    QRS Duration: 81 QT Interval:  271 QTC Calculation: 405 R Axis:   85 Text Interpretation:  Sinus tachycardia LAE, consider biatrial enlargement Probable LVH with secondary repol abnrm Anterior ST elevation, probably due to LVH Baseline wander in lead(s) I agree. similar to old with rate related changes Confirmed by Charlesetta Shanks [id number]) on 05/15/2017 8:00:32 AM       Radiology No results found.  Procedures Procedures (including critical care time) CRITICAL CARE Performed by: Charlesetta Shanks   Total critical care time: 30  minutes  Critical care time was exclusive of separately billable procedures and treating other patients.  Critical care was necessary to treat or prevent imminent or life-threatening deterioration.  Critical care was time spent personally by me on the following activities: development of treatment plan with patient and/or surrogate as well as nursing, discussions with consultants, evaluation of patient's response to treatment, examination of patient, obtaining history from patient or surrogate, ordering and performing treatments and interventions, ordering and review of laboratory studies, ordering and review of radiographic studies, pulse oximetry and re-evaluation of patient's  condition. Medications Ordered in ED Medications  sodium chloride 0.9 % bolus 1,000 mL (not administered)    Followed by  sodium chloride 0.9 % bolus 1,000 mL (not administered)    Followed by  0.9 %  sodium chloride infusion (not administered)  ondansetron (ZOFRAN) injection 4 mg (not administered)  famotidine (PEPCID) IVPB 20 mg premix (not administered)  dextrose 5 %-0.45 % sodium chloride infusion (not administered)  insulin regular bolus via infusion 0-10 Units (not administered)  insulin regular (NOVOLIN R,HUMULIN R) 100 Units in sodium chloride 0.9 % 100 mL (1 Units/mL) infusion (not administered)  dextrose 50 % solution 25 mL (not administered)  0.45 % sodium chloride infusion (not administered)     Initial Impression / Assessment and Plan / ED Course  I have reviewed the triage vital signs and the nursing notes.  Pertinent labs & imaging results that were available during my care of the patient were reviewed by me and considered in my medical decision making (see chart for details).     Consult: Hospitalist Feliz for admission  Final Clinical Impressions(s) / ED Diagnoses   Final diagnoses:  Nausea vomiting and diarrhea  Diabetic ketoacidosis without coma associated with type 2 diabetes mellitus ([place])   Patient presents as outlined above with onset of first copious diarrhea and then vomiting as well.  Patient is alert without signs of hyperosmolar coma.  No respiratory distress.  Significant acidosis with hyperglycemia consistent with DKA presentation.  Fluid resuscitation initiated in the emergency department and insulin drip.  Plan for admission.  Hospitalist consulted. ED Discharge Orders    None       Charlesetta Shanks, MD 05/15/17 [phone]

## 2017-05-15 NOTE — ED Notes (Signed)
CBG 320. RN aware.

## 2017-05-15 NOTE — ED Notes (Signed)
Report can be called to [facility] [phone] at [phone]

## 2017-05-15 NOTE — Progress Notes (Signed)
When pt arrived to the SD, insulin gtt had been d/c'd by ED RN because the last CBG obtained in the ED was in the 140s. CBG done in SD revealed a result of 108 and when entered into the glucostabilizer there is prompting to restart gtt at 2.9. After discussion with NP, orders given to keep the insulin gtt off and repage NP on call once BMET that is being drawn at this time is resulted. Pt updated at this time. Will continue to monitor

## 2017-05-15 NOTE — ED Triage Notes (Signed)
Patient c/o mid abdominal pain, vomiting, and diarrhea since yesterday.

## 2017-05-15 NOTE — ED Notes (Signed)
Bed: WA04 Expected date:  Expected time:  Means of arrival:  Comments: 

## 2017-05-15 NOTE — ED Notes (Signed)
CBG 201. RN aware.

## 2017-05-15 NOTE — H&P (Signed)
History and Physical    Joshua Escobar:[id number] DOB: [birth year] DOA: 05/15/2017  I have briefly reviewed the patient's prior medical records in [place]  PCP: Golden Circle, FNP  Patient coming from: Home  Chief Complaint: Nausea and vomiting  HPI: Joshua Escobar is a 46 y.o. male with medical history significant of poorly controlled type 2 diabetes mellitus, hypertension, hyperlipidemia, presents to the hospital with chief complaint of abdominal pain, nausea vomiting and diarrhea for the past 3 days.  Patient tells me that he was in his normal state of health up until 3 days ago when he developed profuse diarrhea, as well as nausea and vomiting.  He has had poor p.o. intake since.  He has not taking his insulin at all in the last 2 days due to feeling sick and not eating anything.  His diarrhea resolved and his last loose bowel movement was yesterday, however his abdominal pain nausea and vomiting have persisted and decided to come to the emergency room.  He also tells me that he has not checked any of his blood sugars at home in the last 2 days either.  He has no chest pain or shortness of breath.  He denies fever or chills.  No lightheadedness or dizziness.  He states that every time he gets into DKA he gets similar symptoms.  ED Course: In the emergency room his vital signs are stable, he is slightly tachycardic but afebrile and normotensive.  Blood work is remarkable for a glucose of 400, bicarbonate of 13, and an anion gap of 25.  Patient was placed on insulin infusion and we were asked to admit for DKA.  Review of Systems: As per HPI otherwise 10 point review of systems negative.   Past Medical History:  Diagnosis Date  . Migraine headache   . Type II diabetes mellitus ([place])     Past Surgical History:  Procedure Laterality Date  . LEFT HEART CATH AND CORONARY ANGIOGRAPHY N/A 11/27/2016   Procedure: Left Heart Cath and Coronary Angiography;  Surgeon: Lorretta Harp, MD;  Location: [place] [facility] LAB;  Service: Cardiovascular;  Laterality: N/A;  . NO PAST SURGERIES       reports that he has been smoking cigarettes.  He has a 10.00 pack-year smoking history. he has never used smokeless tobacco. He reports that he drinks about 3.6 oz of alcohol per week. He reports that he does not use drugs.  No Known Allergies  Family History  Problem Relation Age of Onset  . Diabetes Mellitus II Sister   . Diabetes Mellitus II Brother   . Diabetes Mellitus II Mother   . Diabetes Mellitus II Father     Prior to Admission medications   Medication Sig Start Date End Date Taking? Authorizing Provider  blood glucose meter kit and supplies Dispense based on patient and insurance preference. Use up to four times daily as directed. (FOR ICD-9 250.00, 250.01). 10/05/16   Dessa Phi, DO  cyclobenzaprine (FLEXERIL) 10 MG tablet Take 1 tablet (10 mg total) by mouth at bedtime as needed for muscle spasms. 03/23/17   Tasia Catchings, Amy V, PA-C  dicyclomine (BENTYL) 10 MG capsule Take 1 capsule (10 mg total) by mouth 3 (three) times daily before meals. 02/01/17   Golden Circle, FNP  doxycycline (VIBRA-TABS) 100 MG tablet Take 1 tablet (100 mg total) by mouth 2 (two) times daily. 02/08/17   Golden Circle, FNP  gabapentin (NEURONTIN) 100 MG capsule Take  1 capsule (100 mg total) by mouth at bedtime. 12/28/16   Golden Circle, FNP  insulin aspart (NOVOLOG FLEXPEN) 100 UNIT/ML FlexPen Inject 7 Units into the skin 3 (three) times daily with meals. 10/31/16 01/30/17  Golden Circle, FNP  Insulin Glargine (LANTUS SOLOSTAR) 100 UNIT/ML Solostar Pen Inject 25 Units into the skin at bedtime. Patient taking differently: Inject 24 Units into the skin at bedtime.  10/31/16 01/30/17  Golden Circle, FNP  Insulin Pen Needle 31G X 5 MM MISC Use 4 times daily with insulin 10/05/16   Dessa Phi, DO  Multiple Vitamin (MULTIVITAMIN WITH MINERALS) TABS tablet Take 1 tablet by mouth daily.    [provider]  omeprazole (PRILOSEC) 40 MG capsule Take 1 capsule (40 mg total) by mouth daily. 02/01/17   Golden Circle, FNP  ondansetron (ZOFRAN ODT) 8 MG disintegrating tablet 74m ODT q4 hours prn nausea Patient taking differently: Take 8 mg by mouth every 4 (four) hours as needed for nausea. 855mODT q4 hours prn nausea 12/24/16   Muthersbaugh, HaJarrett SohoPA-C    Physical Exam: Vitals:   05/15/17 0747 05/15/17 0752  BP: (!) 145/98   Pulse: (!) 143 (!) 137  Resp: 20 (!) 24  Temp: 98.2 F (36.8 C)   SpO2: 100% 100%  Weight:  68 kg (150 lb)  Height:  _0  (1.727 m)    Constitutional: Appears uncomfortable on the ED stretcher Eyes: PERRL, lids and conjunctivae normal ENMT: Mucous membranes are dry Neck: normal, supple Respiratory: clear to auscultation bilaterally, no wheezing, no crackles. Normal respiratory effort. No accessory muscle use.  Cardiovascular: Regular rate and rhythm, no murmurs / rubs / gallops. No extremity edema. 2+ pedal pulses.  Abdomen: Diffuse abdominal tenderness, no masses palpated. Bowel sounds positive.  No guarding or rebound Musculoskeletal: no clubbing / cyanosis.  Skin: no rashes, lesions, ulcers. No induration Neurologic: CN 2-12 grossly intact. Strength 5/5 in all 4.  Psychiatric: Normal judgment and insight. Alert and oriented x 3. Normal mood.   Labs on Admission: I have personally reviewed following labs and imaging studies  CBC: Recent Labs  Lab 05/15/17 0817  WBC 8.5  HGB 16.3  HCT 49.2  MCV 81.7  PLT 30732 Basic Metabolic Panel: Recent Labs  Lab 05/15/17 0817  NA 132*  K 4.8  CL 94*  CO2 13*  GLUCOSE 398*  BUN 16  CREATININE 1.20  CALCIUM 10.5*   GFR: Estimated Creatinine Clearance: 74.8 mL/min (by C-G formula based on SCr of 1.2 mg/dL). Liver Function Tests: Recent Labs  Lab 05/15/17 0817  AST 20  ALT 21  ALKPHOS 107  BILITOT 1.7*  PROT 8.8*  ALBUMIN 4.9   Recent Labs  Lab 05/15/17 0817  LIPASE 25   No  results for input(s): AMMONIA in the last 168 hours. Coagulation Profile: No results for input(s): INR, PROTIME in the last 168 hours. Cardiac Enzymes: No results for input(s): CKTOTAL, CKMB, CKMBINDEX, TROPONINI in the last 168 hours. BNP (last 3 results) No results for input(s): PROBNP in the last 8760 hours. HbA1C: No results for input(s): HGBA1C in the last 72 hours. CBG: No results for input(s): GLUCAP in the last 168 hours. Lipid Profile: No results for input(s): CHOL, HDL, LDLCALC, TRIG, CHOLHDL, LDLDIRECT in the last 72 hours. Thyroid Function Tests: No results for input(s): TSH, T4TOTAL, FREET4, T3FREE, THYROIDAB in the last 72 hours. Anemia Panel: No results for input(s): VITAMINB12, FOLATE, FERRITIN, TIBC, IRON, RETICCTPCT in the  last 72 hours. Urine analysis:    Component Value Date/Time   COLORURINE YELLOW 12/23/2016 2300   APPEARANCEUR CLEAR 12/23/2016 2300   LABSPEC 1.025 12/23/2016 2300   PHURINE 5.0 12/23/2016 2300   GLUCOSEU 50 (A) 12/23/2016 2300   HGBUR NEGATIVE 12/23/2016 2300   BILIRUBINUR NEGATIVE 12/23/2016 2300   KETONESUR 20 (A) 12/23/2016 2300   PROTEINUR NEGATIVE 12/23/2016 2300   UROBILINOGEN 0.2 06/30/2013 2031   NITRITE NEGATIVE 12/23/2016 2300   LEUKOCYTESUR NEGATIVE 12/23/2016 2300     Radiological Exams on Admission: No results found.  EKG: Independently reviewed.  Sinus tachycardia, LVH, ST segment changes  Assessment/Plan Active Problems:   Uncontrolled type 2 diabetes mellitus without complication, with long-term current use of insulin (HCC)   HTN (hypertension)   Nausea & vomiting   Diabetic neuropathy (HCC)   Generalized abdominal pain   DKA (diabetic ketoacidoses) ([place])   DKA with underlying DM2 -Unclear about patient's compliance, may have had a viral GI illness a few days ago which prompted him not to give himself any of his home insulin causing DKA. -Admit to stepdown, placed on IV insulin, received IV bolus in the  ED -Keep n.p.o. -Most recent hemoglobin A1c was 9.3 in September 2018 -resume gabapentin   Abdominal pain, intractable nausea and vomiting -Likely in the setting of DKA, his diarrhea seems to have been resolved right now.  EDP ordered a C. difficile, however patient has not had any diarrheal stools over the past 24 hours -Supportive treatment right now -C diff ordered per EDP, send one if diarrhea reoccurs  Abnormal EKG -Patient with LVH, concern for ST segment changes, this was recently evaluated by cardiology in July 2018 as he had chest pain, and he underwent a cardiac catheterization which showed normal coronary arteries and normal LV function   DVT prophylaxis: Lovenox  Code Status: Full code  Family Communication: no family at bedside Disposition Plan: admit to SDU, home when ready  Consults called: none      Admission status: Observation   At the point of initial evaluation, it is my clinical opinion that admission for OBSERVATION is reasonable and necessary because the patient's presenting complaints in the context of their chronic conditions represent sufficient risk of deterioration or significant morbidity to constitute reasonable grounds for close observation in the hospital setting, but that the patient may be medically stable for discharge from the hospital within 24 to 48 hours.     Marzetta Board, MD [facility] Pager [phone]  If 7PM-7AM, please contact night-coverage [email or web address] Password [facility]  05/15/2017, 9:44 AM

## 2017-05-15 NOTE — Progress Notes (Signed)
Spoke with attending to see if pt is able to transfer to a lower level of care as it is report that pt will be potentially off the insulin gtt within the next two hours. MD states that without assurance that gtt has met criteria to be d/c'd he will need to remain SD status. ED RN notified to continue with current POC and bring pt to the SD.

## 2017-05-15 NOTE — ED Notes (Signed)
CBG 247. RN aware.

## 2017-05-15 NOTE — ED Notes (Signed)
Bed: WLPT1 Expected date:  Expected time:  Means of arrival:  Comments: 

## 2017-05-16 DIAGNOSIS — N179 Acute kidney failure, unspecified: Secondary | ICD-10-CM

## 2017-05-16 DIAGNOSIS — E1142 Type 2 diabetes mellitus with diabetic polyneuropathy: Secondary | ICD-10-CM

## 2017-05-16 DIAGNOSIS — E1110 Type 2 diabetes mellitus with ketoacidosis without coma: Secondary | ICD-10-CM | POA: Diagnosis not present

## 2017-05-16 DIAGNOSIS — R109 Unspecified abdominal pain: Secondary | ICD-10-CM | POA: Diagnosis present

## 2017-05-16 DIAGNOSIS — K529 Noninfective gastroenteritis and colitis, unspecified: Secondary | ICD-10-CM

## 2017-05-16 LAB — GLUCOSE, CAPILLARY
Glucose-Capillary: 203 mg/dL — ABNORMAL HIGH (ref 65–99)
Glucose-Capillary: 214 mg/dL — ABNORMAL HIGH (ref 65–99)
Glucose-Capillary: 232 mg/dL — ABNORMAL HIGH (ref 65–99)
Glucose-Capillary: 238 mg/dL — ABNORMAL HIGH (ref 65–99)
Glucose-Capillary: 263 mg/dL — ABNORMAL HIGH (ref 65–99)

## 2017-05-16 LAB — BASIC METABOLIC PANEL WITH GFR
Anion gap: 8 (ref 5–15)
BUN: 11 mg/dL (ref 6–20)
CO2: 20 mmol/L — ABNORMAL LOW (ref 22–32)
Calcium: 9.5 mg/dL (ref 8.9–10.3)
Chloride: 105 mmol/L (ref 101–111)
Creatinine, Ser: 0.8 mg/dL (ref 0.61–1.24)
GFR calc Af Amer: >60 mL/min
GFR calc non Af Amer: >60 mL/min
Glucose, Bld: 186 mg/dL — ABNORMAL HIGH (ref 65–99)
Potassium: 4.1 mmol/L (ref 3.5–5.1)
Sodium: 133 mmol/L — ABNORMAL LOW (ref 135–145)

## 2017-05-16 LAB — BASIC METABOLIC PANEL
Anion gap: 7 (ref 5–15)
BUN: 13 mg/dL (ref 6–20)
CO2: 22 mmol/L (ref 22–32)
Calcium: 9.2 mg/dL (ref 8.9–10.3)
Chloride: 104 mmol/L (ref 101–111)
Creatinine, Ser: 0.7 mg/dL (ref 0.61–1.24)
GFR calc Af Amer: >60 mL/min (ref >60)
GFR calc non Af Amer: >60 mL/min (ref >60)
Glucose, Bld: 213 mg/dL — ABNORMAL HIGH (ref 65–99)
Potassium: 3.4 mmol/L — ABNORMAL LOW (ref 3.5–5.1)
Sodium: 133 mmol/L — ABNORMAL LOW (ref 135–145)

## 2017-05-16 LAB — MRSA PCR SCREENING: MRSA by PCR: NEGATIVE

## 2017-05-16 MED ORDER — INSULIN ASPART 100 UNIT/ML ~~LOC~~ SOLN
0.0000 [IU] | Freq: Three times a day (TID) | SUBCUTANEOUS | Status: DC
  Administered 2017-05-16: 8 [IU] via SUBCUTANEOUS
  Administered 2017-05-16 (×2): 5 [IU] via SUBCUTANEOUS
  Administered 2017-05-17: 8 [IU] via SUBCUTANEOUS
  Administered 2017-05-17: 2 [IU] via SUBCUTANEOUS
  Administered 2017-05-17: 5 [IU] via SUBCUTANEOUS

## 2017-05-16 MED ORDER — INSULIN ASPART 100 UNIT/ML ~~LOC~~ SOLN
0.0000 [IU] | Freq: Every day | SUBCUTANEOUS | Status: DC
  Administered 2017-05-16 (×2): 2 [IU] via SUBCUTANEOUS

## 2017-05-16 NOTE — Plan of Care (Signed)
  Progressing Clinical Measurements: Ability to maintain clinical measurements within normal limits will improve 05/16/2017 1017 - Progressing by Charlett Lango, RN Will remain free from infection 05/16/2017 1017 - Progressing by Charlett Lango, RN Diagnostic test results will improve 05/16/2017 1017 - Progressing by Charlett Lango, RN

## 2017-05-16 NOTE — Care Management Note (Signed)
Case Management Note  Patient Details  Name: Joshua Escobar MRN: [id number] Date of Birth: November 02, 1970  Subjective/Objective:                  dka  Action/Plan: Date: May 16, 2017 Marcelle Smiling, BSN, [place], [place] [phone] Chart and notes review for patient progress and needs. Will follow for case management and discharge needs. Next review date: [id number]  Expected Discharge Date:  (unknown)               Expected Discharge Plan:  Home/Self Care  In-House Referral:     Discharge planning Services  CM Consult  Post Acute Care Choice:    Choice offered to:     DME Arranged:    DME Agency:     HH Arranged:    HH Agency:     Status of Service:  In process, will continue to follow  If discussed at Long Length of Stay Meetings, dates discussed:    Additional Comments:  Golda Acre, RN 05/16/2017, 8:57 AM

## 2017-05-16 NOTE — Progress Notes (Signed)
  PROGRESS NOTE  Joshua Escobar GGE:[id number] DOB: Apr 27, 1971 DOA: 05/15/2017 PCP: Veryl Speak, FNP  Brief Narrative: 46yom PMH DM type 2 presented with n/v/d/abd pain. Admitted for DKA  Assessment/Plan DKA, known DM type 2 with peripheral neuropathy. Hgb A1c 9.3 Sept 2018. U/A negative. - DKA resolved. CBG under 250; AG closed, normal CO2 - Lantus 25 units SQ QHS, novolog 7 units TID with meals - continue gabapentin  N/V/D, abdominal pain. Suspected viral gastroenteritis. - resolved.   AKI secondary to GI losses, DKA - resolved  Abnormal EKG. LVH. S/p LHC showing normal coronary arteries, LV function. No further evaluation recommended.  Smoker  - recommend cessation   Advance diet, if tolerates, home next 24 hours, possible this afternoon  DVT prophylaxis: enoxaparin Code Status: full Family Communication: none Disposition Plan: home    Brendia Sacks, MD  [facility] Direct contact: [phone] --Via amion app OR  --[email or web address]; password [email or web address]  7PM-7AM contact night coverage as above 05/16/2017, 7:51 AM  LOS: 0 days   Consultants:    Procedures:    Antimicrobials:    Interval history/Subjective: Feels better. No n/v/d. Minimal lower abd pain. Hungry.  Objective: Vitals:  Vitals:   05/16/17 0130 05/16/17 0400  BP: 133/88 (!) 119/92  Pulse: 100 98  Resp: 16 15  Temp:  98.4 F (36.9 C)  SpO2: 99% 98%    Exam:  Constitutional:  . Appears calm and comfortable Eyes:  . pupils and irises appear normal . Normal lids  ENMT:  . grossly normal hearing  . Lips appear normal Respiratory:  . CTA bilaterally, no w/r/r.  . Respiratory effort normal.  Cardiovascular:  . RRR, no m/r/g . No LE extremity edema   Abdomen:  . Abdomen appears normal; no tenderness or masses Psychiatric:  . Mental status o Mood, affect appropriate  I have personally reviewed the following:   Labs:  CBG under 250; AG closed, normal  CO2  Creatinine has normalized  Imaging studies:  none  Medical tests:  EKG ST BAE LVH no acute changes   Scheduled Meds: . enoxaparin (LOVENOX) injection  40 mg Subcutaneous Q24H  . gabapentin  100 mg Oral QHS  . insulin aspart  0-15 Units Subcutaneous TID WC  . insulin aspart  0-5 Units Subcutaneous QHS  . insulin glargine  23 Units Subcutaneous QHS  . insulin regular  0-10 Units Intravenous TID WC   Continuous Infusions: . sodium chloride 75 mL/hr at 05/16/17 0300    Active Problems:   Uncontrolled type 2 diabetes mellitus without complication, with long-term current use of insulin (HCC)   HTN (hypertension)   Nausea & vomiting   Diabetic neuropathy (HCC)   Generalized abdominal pain   DKA (diabetic ketoacidoses) (HCC)   LOS: 0 days

## 2017-05-17 DIAGNOSIS — E1110 Type 2 diabetes mellitus with ketoacidosis without coma: Secondary | ICD-10-CM | POA: Diagnosis not present

## 2017-05-17 LAB — GLUCOSE, CAPILLARY
Glucose-Capillary: 207 mg/dL — ABNORMAL HIGH (ref 65–99)
Glucose-Capillary: 240 mg/dL — ABNORMAL HIGH (ref 65–99)
Glucose-Capillary: 121 mg/dL — ABNORMAL HIGH (ref 65–99)

## 2017-05-17 MED ORDER — INSULIN ASPART 100 UNIT/ML ~~LOC~~ SOLN
4.0000 [IU] | Freq: Three times a day (TID) | SUBCUTANEOUS | Status: DC
  Administered 2017-05-17: 4 [IU] via SUBCUTANEOUS

## 2017-05-17 MED ORDER — INSULIN GLARGINE 100 UNIT/ML SOLOSTAR PEN: 25.0000 [IU] | PEN_INJECTOR | Freq: Every day | SUBCUTANEOUS | 0 refills | Status: DC

## 2017-05-17 MED ORDER — ASPIRIN EC 81 MG PO TBEC: 81.0000 mg | DELAYED_RELEASE_TABLET | Freq: Every day | ORAL | 0 refills | Status: AC

## 2017-05-17 MED ORDER — ATORVASTATIN CALCIUM 40 MG PO TABS: 40.0000 mg | ORAL_TABLET | Freq: Every day | ORAL | 0 refills | Status: DC

## 2017-05-17 NOTE — Discharge Summary (Signed)
Physician Discharge Summary  Joshua Escobar NOI:[id number] DOB: Dec 17, 1970 DOA: 05/15/2017  PCP: Golden Circle, FNP  Admit date: 05/15/2017 Discharge date: 05/17/2017  Time spent: over 30 minutes  Recommendations for Outpatient Follow-up:  1. Follow up outpatient CBC/CMP 2. Follow up outpatient BG's and titrate insulin as needed 3. Started on ASA/lipitor here given DM, normal LFT's on presentation, ctm for side effects/intolerance 4. Continue rec for smoking cessation  Discharge Diagnoses:  Principal Problem:   DKA (diabetic ketoacidoses) ([place]) Active Problems:   Acute kidney injury ([place])   HTN (hypertension)   Nausea & vomiting   Diabetic neuropathy ([facility])   Generalized abdominal pain   DM type 2 with diabetic peripheral neuropathy ([place])   Gastroenteritis   Abdominal pain   Discharge Condition: stable  Diet recommendation: heart healthy  Filed Weights   05/15/17 0752  Weight: 68 kg (150 lb)    History of present illness:  51yom PMH DM type 2 presented with n/v/d/abd pain. Admitted for DKA.  Hospital Course:  DKA, known DM type 2 with peripheral neuropathy. Hgb A1c 9.3 Sept 2018. U/A negative. - DKA resolved. CBG under 250; AG closed, normal CO2 - Discharged with home lantus 25 units SQ QHS, novolog 7 units TID with meals - continue gabapentin - started ASA and atorvastatin at d/c  N/V/D, abdominal pain. Suspected viral gastroenteritis. - resolved.   AKI secondary to GI losses, DKA - resolved  Abnormal EKG. LVH. S/p LHC showing normal coronary arteries, LV function (11/2016). No further evaluation recommended at this time.  Smoker  - recommend cessation   Procedures:  none (i.e. Studies not automatically included, echos, thoracentesis, etc; not x-rays)  Consultations:  none  Discharge Exam: Vitals:   05/17/17 0451 05/17/17 1324  BP: 112/84 105/83  Pulse: (!) 108 (!) 40  Resp: 18 19  Temp: 98.9 F (37.2 C) 99.7 F (37.6 C)  SpO2: 100%  96%   Some nausea, vomiting resolved.  No diarrhea. No abdominal pain.  No CP or SOB.  General: No acute distress. Cardiovascular: Heart sounds show a regular rate, and rhythm. No gallops or rubs. No murmurs. No JVD.  HR ~100.   Lungs: Clear to auscultation bilaterally with good air movement. No rales, rhonchi or wheezes. Abdomen: Soft, nontender, nondistended. No masses. No hepatosplenomegaly. Neurological: Alert and oriented 3. Moves all extremities 4 with equal strength. Cranial nerves II through XII grossly intact. Skin: Warm and dry. No rashes or lesions. Extremities: No clubbing or cyanosis. No edema.  Psychiatric: Mood and affect are normal. Insight and judgment are appropriate.   Discharge Instructions   Discharge Instructions    Call MD for:  difficulty breathing, headache or visual disturbances   Complete by:  As directed    Call MD for:  extreme fatigue   Complete by:  As directed    Call MD for:  persistant dizziness or light-headedness   Complete by:  As directed    Call MD for:  persistant nausea and vomiting   Complete by:  As directed    Call MD for:  severe uncontrolled pain   Complete by:  As directed    Call MD for:  temperature >100.4   Complete by:  As directed    Diet - low sodium heart healthy   Complete by:  As directed    Discharge instructions   Complete by:  As directed    You were admitted for DKA.  Your blood sugars and labs have improved. Please follow  up with your PCP within the week to follow up on your blood sugars.  Check your blood sugars before meals and bedtime and bring these results to your primary care doctor. Because of your diabetes, you should be taking an aspirin and a statin (cholesterol lowering drug) daily.  I ordered both of these for you at discharge.  You can go ahead and start these medicines (they help decrease the risk of heart attack and stroke).  Please follow up with your PCP regarding these medications.  Stopping  smoking is also extremely important, smoking increases your risk of heart attack, stroke and other problems with diabetes.  Please follow up with your primary regarding this as well.  Please have your primary care physician request records from this hospitalization so they know what was done and what should be done next.   Increase activity slowly   Complete by:  As directed      Allergies as of 05/17/2017   No Known Allergies     Medication List    STOP taking these medications   cyclobenzaprine 10 MG tablet Commonly known as:  FLEXERIL   dicyclomine 10 MG capsule Commonly known as:  BENTYL   doxycycline 100 MG tablet Commonly known as:  VIBRA-TABS   gabapentin 100 MG capsule Commonly known as:  NEURONTIN   omeprazole 40 MG capsule Commonly known as:  PRILOSEC   ondansetron 8 MG disintegrating tablet Commonly known as:  ZOFRAN ODT     TAKE these medications   aspirin EC 81 MG tablet Take 1 tablet (81 mg total) by mouth daily.   atorvastatin 40 MG tablet Commonly known as:  LIPITOR Take 1 tablet (40 mg total) by mouth daily.   blood glucose meter kit and supplies Dispense based on patient and insurance preference. Use up to four times daily as directed. (FOR ICD-9 250.00, 250.01).   insulin aspart 100 UNIT/ML FlexPen Commonly known as:  NOVOLOG FLEXPEN Inject 7 Units into the skin 3 (three) times daily with meals.   Insulin Glargine 100 UNIT/ML Solostar Pen Commonly known as:  LANTUS SOLOSTAR Inject 25 Units into the skin at bedtime. What changed:  how much to take   Insulin Pen Needle 31G X 5 MM Misc Use 4 times daily with insulin   multivitamin with minerals Tabs tablet Take 1 tablet by mouth daily.      No Known Allergies Follow-up Information    Golden Circle, FNP. Schedule an appointment as soon as possible for a visit in 1 week(s).   Specialties:  Family Medicine, Infectious Diseases Contact information: [address] [place]  [id number] [phone]            The results of significant diagnostics from this hospitalization (including imaging, microbiology, ancillary and laboratory) are listed below for reference.    Significant Diagnostic Studies: No results found.  Microbiology: Recent Results (from the past 240 hour(s))  MRSA PCR Screening     Status: None   Collection Time: 05/16/17  3:20 AM  Result Value Ref Range Status   MRSA by PCR NEGATIVE NEGATIVE Final    Comment:        The GeneXpert MRSA Assay (FDA approved for NASAL specimens only), is one component of a comprehensive MRSA colonization surveillance program. It is not intended to diagnose MRSA infection nor to guide or monitor treatment for MRSA infections.      Labs: Basic Metabolic Panel: Recent Labs  Lab 05/15/17 0817 05/15/17 1613 05/15/17 1955  05/15/17 2353 05/16/17 0304  NA 132* 137 136 133* 133*  K 4.8 4.1 4.0 4.1 3.4*  CL 94* 106 105 105 104  CO2 13* 20* 24 20* 22  GLUCOSE 398* 222* 121* 186* 213*  BUN _0 CREATININE 1.20 0.75 0.72 0.80 0.70  CALCIUM 10.5* 9.8 9.4 9.5 9.2   Liver Function Tests: Recent Labs  Lab 05/15/17 0817  AST 20  ALT 21  ALKPHOS 107  BILITOT 1.7*  PROT 8.8*  ALBUMIN 4.9   Recent Labs  Lab 05/15/17 0817  LIPASE 25   No results for input(s): AMMONIA in the last 168 hours. CBC: Recent Labs  Lab 05/15/17 0817  WBC 8.5  HGB 16.3  HCT 49.2  MCV 81.7  PLT 300   Cardiac Enzymes: No results for input(s): CKTOTAL, CKMB, CKMBINDEX, TROPONINI in the last 168 hours. BNP: BNP (last 3 results) Recent Labs    11/25/16 2026  BNP 4.8    ProBNP (last 3 results) No results for input(s): PROBNP in the last 8760 hours.  CBG: Recent Labs  Lab 05/16/17 1533 05/16/17 2126 05/17/17 0745 05/17/17 1212 05/17/17 1620  GLUCAP 232* 238* 207* 240* 121*       Signed:  Fayrene Helper MD.  [facility] 05/17/2017, 8:20 PM

## 2017-05-17 NOTE — Discharge Instructions (Signed)
Blood Glucose Monitoring, Adult  Monitoring your blood sugar (glucose) helps you manage your diabetes. It also helps you and your health care provider determine how well your diabetes management plan is working. Blood glucose monitoring involves checking your blood glucose as often as directed, and keeping Joshua Escobar record (log) of your results over time.  Why should I monitor my blood glucose?  Checking your blood glucose regularly can:   Help you understand how food, exercise, illnesses, and medicines affect your blood glucose.   Let you know what your blood glucose is at any time. You can quickly tell if you are having low blood glucose (hypoglycemia) or high blood glucose (hyperglycemia).   Help you and your health care provider adjust your medicines as needed.    When should I check my blood glucose?  Follow instructions from your health care provider about how often to check your blood glucose. This may depend on:   The type of diabetes you have.   How well-controlled your diabetes is.   Medicines you are taking.    If you have type 1 diabetes:   Check your blood glucose at least 2 times Joshua Escobar day.   Also check your blood glucose:  ? Before every insulin injection.  ? Before and after exercise.  ? Between meals.  ? 2 hours after Joshua Escobar meal.  ? Occasionally between 2:00 Joshua Escobar.m. and 3:00 Joshua Escobar.m., as directed.  ? Before potentially dangerous tasks, like driving or using heavy machinery.  ? At bedtime.   You may need to check your blood glucose more often, up to 6-10 times Joshua Escobar day:  ? If you use an insulin pump.  ? If you need multiple daily injections (MDI).  ? If your diabetes is not well-controlled.  ? If you are ill.  ? If you have Joshua Escobar history of severe hypoglycemia.  ? If you have Joshua Escobar history of not knowing when your blood glucose is getting low (hypoglycemia unawareness).  If you have type 2 diabetes:   If you take insulin or other diabetes medicines, check your blood glucose at least 2 times Joshua Escobar day.   If you are on intensive  insulin therapy, check your blood glucose at least 4 times Joshua Escobar day. Occasionally, you may also need to check between 2:00 Joshua Escobar.m. and 3:00 Joshua Escobar.m., as directed.   Also check your blood glucose:  ? Before and after exercise.  ? Before potentially dangerous tasks, like driving or using heavy machinery.   You may need to check your blood glucose more often if:  ? Your medicine is being adjusted.  ? Your diabetes is not well-controlled.  ? You are ill.  What is Joshua Escobar blood glucose log?   Joshua Escobar blood glucose log is Joshua Escobar record of your blood glucose readings. It helps you and your health care provider:  ? Look for patterns in your blood glucose over time.  ? Adjust your diabetes management plan as needed.   Every time you check your blood glucose, write down your result and notes about things that may be affecting your blood glucose, such as your diet and exercise for the day.   Most glucose meters store Joshua Escobar record of glucose readings in the meter. Some meters allow you to download your records to Joshua Escobar computer.  How do I check my blood glucose?  Follow these steps to get accurate readings of your blood glucose:  Supplies needed     Blood glucose meter.   Test strips for your meter. Each   meter has its own strips. You must use the strips that come with your meter.   Joshua Escobar needle to prick your finger (lancet). Do not use lancets more than once.   Joshua Escobar device that holds the lancet (lancing device).   Joshua Escobar journal or log book to write down your results.  Procedure   Wash your hands with soap and water.   Prick the side of your finger (not the tip) with the lancet. Use Joshua Escobar different finger each time.   Gently rub the finger until Joshua Escobar small drop of blood appears.   Follow instructions that come with your meter for inserting the test strip, applying blood to the strip, and using your blood glucose meter.   Write down your result and any notes.  Alternative testing sites   Some meters allow you to use areas of your body other than your finger  (alternative sites) to test your blood.   If you think you may have hypoglycemia, or if you have hypoglycemia unawareness, do not use alternative sites. Use your finger instead.   Alternative sites may not be as accurate as the fingers, because blood flow is slower in these areas. This means that the result you get may be delayed, and it may be different from the result that you would get from your finger.   The most common alternative sites are:  ? Forearm.  ? Thigh.  ? Palm of the hand.  Additional tips   Always keep your supplies with you.   If you have questions or need help, all blood glucose meters have Joshua Escobar 24-hour "hotline" number that you can call. You may also contact your health care provider.   After you use Joshua Escobar few boxes of test strips, adjust (calibrate) your blood glucose meter by following instructions that came with your meter.  This information is not intended to replace advice given to you by your health care provider. Make sure you discuss any questions you have with your health care provider.  Document Released: 05/18/2003 Document Revised: 12/03/2015 Document Reviewed: 10/25/2015  Elsevier Interactive Patient Education  2017 [employer]

## 2017-05-17 NOTE — Progress Notes (Signed)
Inpatient Diabetes Program Recommendations  AACE/ADA: New Consensus Statement on Inpatient Glycemic Control (2015)  Target Ranges:  Prepandial:   less than 140 mg/dL      Peak postprandial:   less than 180 mg/dL (1-2 hours)      Critically ill patients:  140 - 180 mg/dL   Lab Results  Component Value Date   GLUCAP 207 (H) 05/17/2017   HGBA1C 9.3 (H) 02/01/2017    Review of Glycemic Control  Diabetes history: DM2 Outpatient Diabetes medications: Lantus 25 units QHS, Novolog 0-15 units tidwc and hs Current orders for Inpatient glycemic control: Lantus 23 units QHS,   HgbA1C of 9.3% on 02/01/2017. Needs updating.  Inpatient Diabetes Program Recommendations:   HgbA1C to assess glycemic control over past 3 months. Increase Lantus to 25 units QHS Add Novolog 4 units tidwc for meal coverage insulin when diet is advanced and pt is eating > 50% of meal.  Continue to follow.  Thank you. Ailene Ards, RD, LDN, CDE Inpatient Diabetes Coordinator [phone]

## 2017-05-17 NOTE — Progress Notes (Signed)
Discharge instructions discussed with patient.  Patient verbalized understanding.

## 2017-06-06 DIAGNOSIS — R1084 Generalized abdominal pain: Secondary | ICD-10-CM | POA: Diagnosis not present

## 2017-06-06 DIAGNOSIS — R634 Abnormal weight loss: Secondary | ICD-10-CM | POA: Diagnosis not present

## 2017-06-06 DIAGNOSIS — K219 Gastro-esophageal reflux disease without esophagitis: Secondary | ICD-10-CM | POA: Diagnosis not present

## 2017-06-06 DIAGNOSIS — R194 Change in bowel habit: Secondary | ICD-10-CM | POA: Diagnosis not present

## 2017-06-10 ENCOUNTER — Other Ambulatory Visit: Payer: Self-pay

## 2017-06-10 ENCOUNTER — Encounter (HOSPITAL_COMMUNITY): Payer: Self-pay | Admitting: *Deleted

## 2017-06-10 ENCOUNTER — Ambulatory Visit (INDEPENDENT_AMBULATORY_CARE_PROVIDER_SITE_OTHER)
Admission: EM | Admit: 2017-06-10 | Discharge: 2017-06-10 | Disposition: A | Discharge status: Home / Self Care | Payer: BLUE CROSS/BLUE SHIELD | Source: Home / Self Care | Attending: Internal Medicine | Admitting: Internal Medicine

## 2017-06-10 ENCOUNTER — Observation Stay (HOSPITAL_COMMUNITY)
Admission: EM | Admit: 2017-06-10 | Discharge: 2017-06-11 | Disposition: A | Discharge status: Home / Self Care | Payer: BLUE CROSS/BLUE SHIELD | Attending: Internal Medicine | Admitting: Internal Medicine

## 2017-06-10 ENCOUNTER — Encounter (HOSPITAL_COMMUNITY): Payer: Self-pay | Admitting: Emergency Medicine

## 2017-06-10 DIAGNOSIS — E785 Hyperlipidemia, unspecified: Secondary | ICD-10-CM | POA: Diagnosis present

## 2017-06-10 DIAGNOSIS — I10 Essential (primary) hypertension: Secondary | ICD-10-CM | POA: Diagnosis not present

## 2017-06-10 DIAGNOSIS — Z794 Long term (current) use of insulin: Secondary | ICD-10-CM | POA: Insufficient documentation

## 2017-06-10 DIAGNOSIS — Z79899 Other long term (current) drug therapy: Secondary | ICD-10-CM | POA: Insufficient documentation

## 2017-06-10 DIAGNOSIS — M5489 Other dorsalgia: Secondary | ICD-10-CM | POA: Diagnosis not present

## 2017-06-10 DIAGNOSIS — R112 Nausea with vomiting, unspecified: Secondary | ICD-10-CM | POA: Diagnosis not present

## 2017-06-10 DIAGNOSIS — R1084 Generalized abdominal pain: Secondary | ICD-10-CM | POA: Diagnosis not present

## 2017-06-10 DIAGNOSIS — E1110 Type 2 diabetes mellitus with ketoacidosis without coma: Principal | ICD-10-CM | POA: Diagnosis present

## 2017-06-10 DIAGNOSIS — F17210 Nicotine dependence, cigarettes, uncomplicated: Secondary | ICD-10-CM | POA: Diagnosis not present

## 2017-06-10 DIAGNOSIS — Z7982 Long term (current) use of aspirin: Secondary | ICD-10-CM | POA: Insufficient documentation

## 2017-06-10 DIAGNOSIS — E860 Dehydration: Secondary | ICD-10-CM | POA: Diagnosis present

## 2017-06-10 DIAGNOSIS — R197 Diarrhea, unspecified: Secondary | ICD-10-CM | POA: Diagnosis not present

## 2017-06-10 DIAGNOSIS — G8929 Other chronic pain: Secondary | ICD-10-CM | POA: Insufficient documentation

## 2017-06-10 DIAGNOSIS — M545 Low back pain, unspecified: Secondary | ICD-10-CM

## 2017-06-10 DIAGNOSIS — Z23 Encounter for immunization: Secondary | ICD-10-CM | POA: Insufficient documentation

## 2017-06-10 DIAGNOSIS — E1142 Type 2 diabetes mellitus with diabetic polyneuropathy: Secondary | ICD-10-CM | POA: Diagnosis not present

## 2017-06-10 DIAGNOSIS — E876 Hypokalemia: Secondary | ICD-10-CM | POA: Diagnosis not present

## 2017-06-10 DIAGNOSIS — I951 Orthostatic hypotension: Secondary | ICD-10-CM | POA: Insufficient documentation

## 2017-06-10 DIAGNOSIS — M549 Dorsalgia, unspecified: Secondary | ICD-10-CM | POA: Diagnosis present

## 2017-06-10 LAB — POCT URINALYSIS DIP (DEVICE)
Glucose, UA: 500 mg/dL — AB
Hgb urine dipstick: NEGATIVE
Ketones, ur: 40 mg/dL — AB
Leukocytes, UA: NEGATIVE
Nitrite: NEGATIVE
Protein, ur: 30 mg/dL — AB
Specific Gravity, Urine: >=1.03 (ref 1.005–1.030)
Urobilinogen, UA: 0.2 mg/dL (ref 0.0–1.0)
pH: 6 (ref 5.0–8.0)

## 2017-06-10 LAB — BASIC METABOLIC PANEL
Anion gap: 19 — ABNORMAL HIGH (ref 5–15)
Anion gap: 15 (ref 5–15)
BUN: 15 mg/dL (ref 6–20)
BUN: 13 mg/dL (ref 6–20)
CO2: 21 mmol/L — ABNORMAL LOW (ref 22–32)
CO2: 20 mmol/L — ABNORMAL LOW (ref 22–32)
Calcium: 9.8 mg/dL (ref 8.9–10.3)
Calcium: 8.9 mg/dL (ref 8.9–10.3)
Chloride: 87 mmol/L — ABNORMAL LOW (ref 101–111)
Chloride: 96 mmol/L — ABNORMAL LOW (ref 101–111)
Creatinine, Ser: 1.13 mg/dL (ref 0.61–1.24)
Creatinine, Ser: 1 mg/dL (ref 0.61–1.24)
GFR calc Af Amer: >60 mL/min (ref >60)
GFR calc Af Amer: >60 mL/min (ref >60)
GFR calc non Af Amer: >60 mL/min (ref >60)
GFR calc non Af Amer: >60 mL/min (ref >60)
Glucose, Bld: 288 mg/dL — ABNORMAL HIGH (ref 65–99)
Glucose, Bld: 234 mg/dL — ABNORMAL HIGH (ref 65–99)
Potassium: 3.7 mmol/L (ref 3.5–5.1)
Potassium: 3.9 mmol/L (ref 3.5–5.1)
Sodium: 127 mmol/L — ABNORMAL LOW (ref 135–145)
Sodium: 131 mmol/L — ABNORMAL LOW (ref 135–145)

## 2017-06-10 LAB — I-STAT VENOUS BLOOD GAS, ED
Acid-base deficit: 3 mmol/L — ABNORMAL HIGH (ref 0.0–2.0)
Acid-base deficit: 3 mmol/L — ABNORMAL HIGH (ref 0.0–2.0)
Bicarbonate: 23.5 mmol/L (ref 20.0–28.0)
Bicarbonate: 22.8 mmol/L (ref 20.0–28.0)
O2 Saturation: 19 %
O2 Saturation: 36 %
Patient temperature: 37
TCO2: 25 mmol/L (ref 22–32)
TCO2: 24 mmol/L (ref 22–32)
pCO2, Ven: 45.3 mmHg (ref 44.0–60.0)
pCO2, Ven: 44.1 mmHg (ref 44.0–60.0)
pH, Ven: 7.322 (ref 7.250–7.430)
pH, Ven: 7.321 (ref 7.250–7.430)
pO2, Ven: 16 mmHg — CL (ref 32.0–45.0)
pO2, Ven: 23 mmHg — CL (ref 32.0–45.0)

## 2017-06-10 LAB — CBC
HCT: 51.3 % (ref 39.0–52.0)
Hemoglobin: 17.8 g/dL — ABNORMAL HIGH (ref 13.0–17.0)
MCH: 27.7 pg (ref 26.0–34.0)
MCHC: 34.7 g/dL (ref 30.0–36.0)
MCV: 79.9 fL (ref 78.0–100.0)
Platelets: 254 10*3/uL (ref 150–400)
RBC: 6.42 MIL/uL — ABNORMAL HIGH (ref 4.22–5.81)
RDW: 13.6 % (ref 11.5–15.5)
WBC: 5 10*3/uL (ref 4.0–10.5)

## 2017-06-10 LAB — HEPATIC FUNCTION PANEL
ALT: 17 U/L (ref 17–63)
AST: 20 U/L (ref 15–41)
Albumin: 4.9 g/dL (ref 3.5–5.0)
Alkaline Phosphatase: 111 U/L (ref 38–126)
Bilirubin, Direct: 0.1 mg/dL (ref 0.1–0.5)
Indirect Bilirubin: 1.5 mg/dL — ABNORMAL HIGH (ref 0.3–0.9)
Total Bilirubin: 1.6 mg/dL — ABNORMAL HIGH (ref 0.3–1.2)
Total Protein: 8.8 g/dL — ABNORMAL HIGH (ref 6.5–8.1)

## 2017-06-10 LAB — CBG MONITORING, ED: Glucose-Capillary: 311 mg/dL — ABNORMAL HIGH (ref 65–99)

## 2017-06-10 LAB — LIPASE, BLOOD: Lipase: 23 U/L (ref 11–51)

## 2017-06-10 MED ORDER — NAPROXEN SODIUM 275 MG PO TABS: 275.0000 mg | ORAL_TABLET | Freq: Two times a day (BID) | ORAL | Status: DC | PRN

## 2017-06-10 MED ORDER — SODIUM CHLORIDE 0.9 % IV BOLUS (SEPSIS)
1000.0000 mL | Freq: Once | INTRAVENOUS | Status: AC
  Administered 2017-06-10: 1000 mL via INTRAVENOUS

## 2017-06-10 MED ORDER — ACETAMINOPHEN 325 MG PO TABS: 650.0000 mg | ORAL_TABLET | Freq: Four times a day (QID) | ORAL | Status: DC | PRN

## 2017-06-10 MED ORDER — MORPHINE SULFATE (PF) 4 MG/ML IV SOLN
4.0000 mg | Freq: Once | INTRAVENOUS | Status: AC
  Administered 2017-06-10: 4 mg via INTRAVENOUS
  Filled 2017-06-10: qty 1

## 2017-06-10 MED ORDER — METHOCARBAMOL 500 MG PO TABS: 500.0000 mg | ORAL_TABLET | Freq: Three times a day (TID) | ORAL | Status: DC | PRN

## 2017-06-10 MED ORDER — ONDANSETRON HCL 4 MG/2ML IJ SOLN: 4.0000 mg | Freq: Three times a day (TID) | INTRAMUSCULAR | Status: DC | PRN

## 2017-06-10 MED ORDER — INSULIN GLARGINE 100 UNIT/ML ~~LOC~~ SOLN
16.0000 [IU] | Freq: Every day | SUBCUTANEOUS | Status: DC
  Administered 2017-06-10: 16 [IU] via SUBCUTANEOUS
  Filled 2017-06-10: qty 0.16

## 2017-06-10 MED ORDER — HYDRALAZINE HCL 20 MG/ML IJ SOLN: 5.0000 mg | INTRAMUSCULAR | Status: DC | PRN

## 2017-06-10 MED ORDER — ACETAMINOPHEN 650 MG RE SUPP: 650.0000 mg | Freq: Four times a day (QID) | RECTAL | Status: DC | PRN

## 2017-06-10 MED ORDER — POTASSIUM CHLORIDE CRYS ER 20 MEQ PO TBCR
20.0000 meq | EXTENDED_RELEASE_TABLET | Freq: Once | ORAL | Status: AC
  Administered 2017-06-10: 20 meq via ORAL
  Filled 2017-06-10: qty 1

## 2017-06-10 MED ORDER — INSULIN ASPART 100 UNIT/ML ~~LOC~~ SOLN
0.0000 [IU] | Freq: Three times a day (TID) | SUBCUTANEOUS | Status: DC
  Administered 2017-06-11: 2 [IU] via SUBCUTANEOUS

## 2017-06-10 MED ORDER — ENOXAPARIN SODIUM 40 MG/0.4ML ~~LOC~~ SOLN
40.0000 mg | SUBCUTANEOUS | Status: DC
  Filled 2017-06-10: qty 0.4

## 2017-06-10 MED ORDER — ENSURE ENLIVE PO LIQD: 237.0000 mL | Freq: Two times a day (BID) | ORAL | Status: DC

## 2017-06-10 MED ORDER — ADULT MULTIVITAMIN W/MINERALS CH
1.0000 | ORAL_TABLET | Freq: Every day | ORAL | Status: DC
  Administered 2017-06-11: 1 via ORAL
  Filled 2017-06-10: qty 1

## 2017-06-10 MED ORDER — ONDANSETRON HCL 4 MG/2ML IJ SOLN
4.0000 mg | Freq: Once | INTRAMUSCULAR | Status: AC
  Administered 2017-06-10: 4 mg via INTRAVENOUS
  Filled 2017-06-10: qty 2

## 2017-06-10 MED ORDER — ASPIRIN EC 81 MG PO TBEC
81.0000 mg | DELAYED_RELEASE_TABLET | Freq: Every day | ORAL | Status: DC
  Administered 2017-06-11: 81 mg via ORAL
  Filled 2017-06-10: qty 1

## 2017-06-10 MED ORDER — ZOLPIDEM TARTRATE 5 MG PO TABS: 5.0000 mg | ORAL_TABLET | Freq: Every evening | ORAL | Status: DC | PRN

## 2017-06-10 MED ORDER — SODIUM CHLORIDE 0.9 % IV SOLN
INTRAVENOUS | Status: DC
  Administered 2017-06-10 – 2017-06-11 (×2): via INTRAVENOUS

## 2017-06-10 MED ORDER — INFLUENZA VAC SPLIT QUAD 0.5 ML IM SUSY
0.5000 mL | PREFILLED_SYRINGE | INTRAMUSCULAR | Status: AC
  Administered 2017-06-11: 0.5 mL via INTRAMUSCULAR
  Filled 2017-06-10: qty 0.5

## 2017-06-10 NOTE — ED Provider Notes (Signed)
[facility]    CSN: [id number] Arrival date & time: 06/10/17  1505     History   Chief Complaint Chief Complaint  Patient presents with  . Back Pain  . Dizziness    HPI Joshua Escobar is a 47 y.o. male DM Type 1 presenting with acute back pain since Wednesday. Back pain has continued to worsen. Increased with bending and twisting motions, worse when sitting. Also endorsing decreased appetite and not eating anything over past 2-3 days. Nausea and vomiting 2-3 times a day for the past couple of days. Diarrhea started yesterday. Lightheaded and dizziness with standing. Similar back pain over past year, comes and goes but his time pain is worse and persistent. Does seem to come on with DKA episodes and resolve, but again does feel like back pain is worse.   HPI  Past Medical History:  Diagnosis Date  . Migraine headache   . Type II diabetes mellitus [facility])     Patient Active Problem List   Diagnosis Date Noted  . DM type 2 with diabetic peripheral neuropathy ([place]) 05/16/2017  . Gastroenteritis 05/16/2017  . Abdominal pain 05/16/2017  . DKA (diabetic ketoacidoses) ([place]) 05/15/2017  . Routine adult health maintenance 02/08/2017  . Atypical chest pain 02/01/2017  . Back pain 12/28/2016  . Diabetic neuropathy ([place]) 12/28/2016  . Generalized abdominal pain 12/28/2016  . Chest pain   . Unstable angina ([place]) 11/25/2016  . Abnormal ECG   . DKA, type 2 ([place]) 10/03/2016  . Acute kidney injury ([place]) 10/03/2016  . HTN (hypertension) 10/03/2016  . Nausea & vomiting 10/03/2016  . HLD (hyperlipidemia) 10/03/2016  . Demand ischemia ([place])   . AKI (acute kidney injury) [facility])     Past Surgical History:  Procedure Laterality Date  . LEFT HEART CATH AND CORONARY ANGIOGRAPHY N/A 11/27/2016   Procedure: Left Heart Cath and Coronary Angiography;  Surgeon: Lorretta Harp, MD;  Location: [place] [facility];  Service: Cardiovascular;  Laterality: N/A;       Home Medications      Prior to Admission medications   Medication Sig Start Date End Date Taking? Authorizing Provider  aspirin EC 81 MG tablet Take 1 tablet (81 mg total) by mouth daily. 05/17/17 06/16/17 Yes Elodia Florence., MD  insulin aspart (NOVOLOG FLEXPEN) 100 UNIT/ML FlexPen Inject 7 Units into the skin 3 (three) times daily with meals. 10/31/16 05/16/19 Yes Golden Circle, FNP  Insulin Glargine (LANTUS SOLOSTAR) 100 UNIT/ML Solostar Pen Inject 25 Units into the skin at bedtime. 05/17/17 06/16/17 Yes Elodia Florence., MD  atorvastatin (LIPITOR) 40 MG tablet Take 1 tablet (40 mg total) by mouth daily. 05/17/17 05/17/18  Elodia Florence., MD  blood glucose meter kit and supplies Dispense based on patient and insurance preference. Use up to four times daily as directed. (FOR ICD-9 250.00, 250.01). 10/05/16   Dessa Phi, DO  Insulin Pen Needle 31G X 5 MM MISC Use 4 times daily with insulin 10/05/16   Dessa Phi, DO  Multiple Vitamin (MULTIVITAMIN WITH MINERALS) TABS tablet Take 1 tablet by mouth daily.    [provider]    Family History Family History  Problem Relation Age of Onset  . Diabetes Mellitus II Sister   . Diabetes Mellitus II Brother   . Diabetes Mellitus II Mother   . Diabetes Mellitus II Father     Social History Social History   Tobacco Use  . Smoking status: Current Every Day  Smoker    Packs/day: 0.50    Years: 20.00    Pack years: 10.00    Types: Cigarettes  . Smokeless tobacco: Never Used  Substance Use Topics  . Alcohol use: Yes    Alcohol/week: 3.6 oz    Types: 6 Cans of beer per week    Comment: weekends only  . Drug use: No     Allergies   Patient has no known allergies.   Review of Systems Review of Systems  Constitutional: Positive for appetite change and fatigue.  Eyes: Negative for visual disturbance.  Respiratory: Negative for cough and shortness of breath.   Cardiovascular: Negative for chest pain.  Gastrointestinal:  Positive for abdominal pain, diarrhea, nausea and vomiting.  Skin: Negative for rash.  Neurological: Positive for dizziness and light-headedness. Negative for syncope, weakness and headaches.     Physical Exam Triage Vital Signs ED Triage Vitals  Enc Vitals Group     BP 06/10/17 1526 121/83     Pulse Rate 06/10/17 1526 (!) 128     Resp 06/10/17 1526 16     Temp 06/10/17 1526 98.8 F (37.1 C)     Temp Source 06/10/17 1526 Oral     SpO2 06/10/17 1526 98 %     Weight --      Height --      Head Circumference --      Peak Flow --      Pain Score 06/10/17 1528 8     Pain Loc --      Pain Edu? --      Excl. in [place]? --    Orthostatic VS for the past 24 hrs:  BP- Lying Pulse- Lying BP- Sitting Pulse- Sitting BP- Standing at 0 minutes Pulse- Standing at 0 minutes  06/10/17 1608 129/84 111 128/86 120 90/69 137    Updated Vital Signs BP 121/83 (BP Location: Right Arm)   Pulse (!) 128 Comment: Notified RN  Temp 98.8 F (37.1 C) (Oral)   Resp 16   SpO2 98%    Physical Exam  Constitutional: He appears well-developed and well-nourished.  Thin male, Standing up in room as sitting more uncomfortable,   HENT:  Head: Normocephalic and atraumatic.  Eyes: Conjunctivae are normal.  Neck: Neck supple.  Cardiovascular: Regular rhythm.  No murmur heard. tachycardic  Pulmonary/Chest: Effort normal and breath sounds normal. No respiratory distress.  Abdominal: Soft. He exhibits no distension. There is tenderness.  Musculoskeletal: He exhibits no edema.  Midline tenderness to thoracic and lumbar spine.   Neurological: He is alert.  Skin: Skin is warm and dry.  Psychiatric: He has a normal mood and affect.  Nursing note and vitals reviewed.    UC Treatments / Results  Labs (all labs ordered are listed, but only abnormal results are displayed) Labs Reviewed  POCT URINALYSIS DIP (DEVICE) - Abnormal; Notable for the following components:      Result Value   Glucose, UA 500 (*)     Bilirubin Urine MODERATE (*)    Ketones, ur 40 (*)    Protein, ur 30 (*)    All other components within normal limits    EKG  EKG Interpretation None       Radiology No results found.  Procedures Procedures (including critical care time)  Medications Ordered in UC Medications - No data to display   Initial Impression / Assessment and Plan / UC Course  I have reviewed the triage vital signs and the nursing notes.  Pertinent labs & imaging results that were available during my care of the patient were reviewed by me and considered in my medical decision making (see chart for details).     Patient with ketones and glucose in urine; likely symptoms of DKA or early DKA. Patient with significant dehydration resulting in orthostatic hypotension. Advised to go to Emergency room for IV fluids, further evaluation. Patient stable, discussed with brother and plan to go straight there.   Final Clinical Impressions(s) / UC Diagnoses   Final diagnoses:  Acute midline low back pain without sciatica  Non-intractable vomiting with nausea, unspecified vomiting type  Orthostatic hypotension    ED Discharge Orders    None       Controlled Substance Prescriptions [place] Controlled Substance Registry consulted? Not Applicable   Janith Lima, [place] 06/10/17 1654

## 2017-06-10 NOTE — H&P (Signed)
History and Physical    ZIQUAN FIDEL ONG:[id number] DOB: 1970-10-08 DOA: 06/10/2017  Referring MD/NP/PA:   PCP: Golden Circle, FNP   Patient coming from:  The patient is coming from home.  At baseline, pt is independent for most of ADL.   Chief Complaint: Nausea, vomiting, diarrhea  HPI: MARKEITH JUE is a 47 y.o. male with medical history significant of hypertension, hyperlipidemia, diabetes mellitus, migraine headaches, tobacco abuse, chronic back pain, who presents with nausea, vomiting, diarrhea.  Patient states that he has been having nausea, vomiting, diarrhea. It is. His diarrhea has resolved today. He vomited 4-5 times today, without blood in the vomitus. Patient denies abdominal pain. No symptoms of UTI. Patient does not have chest pain, shortness of breath, cough, fever or chills. Patient reports worsening lower back pain, which has been going on for several days, initially 7 out of 10 in severity, currently 2 out of 10 in severity. No leg numbness or tingliness. No loss control of bilateral bowel movement. Patient initially had mild DKA with bicarbonate 21 and elevated AG of 19, which has resolved with IV fluid. Repeat BMP showed AG 15 in ED.  ED Course: pt was found to have WBC 5.0, negative urinalysis, creatinine 1.13, 1.00 on repeat, temperature normal, tachycardia, no tachypnea, oxygen saturation 98% on room air. Pressure is soft. Patient is placed on telemetry bed for observation.   Review of Systems:   General: no fevers, chills, no body weight gain, has poor appetite, has fatigue HEENT: no blurry vision, hearing changes or sore throat Respiratory: no dyspnea, coughing, wheezing CV: no chest pain, no palpitations GI: has nausea, vomiting, abdominal pain, diarrhea, no constipation GU: no dysuria, burning on urination, increased urinary frequency, hematuria  Ext: no leg edema Neuro: no unilateral weakness, numbness, or tingling, no vision change or hearing  loss Skin: no rash, no skin tear. MSK: has lower back pain. Heme: No easy bruising.  Travel history: No recent long distant travel.  Allergy: No Known Allergies  Past Medical History:  Diagnosis Date  . Migraine headache   . Type II diabetes mellitus ([place])     Past Surgical History:  Procedure Laterality Date  . LEFT HEART CATH AND CORONARY ANGIOGRAPHY N/A 11/27/2016   Procedure: Left Heart Cath and Coronary Angiography;  Surgeon: Lorretta Harp, MD;  Location: [facility];  Service: Cardiovascular;  Laterality: N/A;    Social History:  reports that he has been smoking cigarettes.  He has a 10.00 pack-year smoking history. he has never used smokeless tobacco. He reports that he drinks about 3.6 oz of alcohol per week. He reports that he does not use drugs.  Family History:  Family History  Problem Relation Age of Onset  . Diabetes Mellitus II Sister   . Diabetes Mellitus II Brother   . Diabetes Mellitus II Mother   . Diabetes Mellitus II Father      Prior to Admission medications   Medication Sig Start Date End Date Taking? Authorizing Provider  aspirin EC 81 MG tablet Take 1 tablet (81 mg total) by mouth daily. 05/17/17 06/16/17 Yes Elodia Florence., MD  insulin aspart (NOVOLOG FLEXPEN) 100 UNIT/ML FlexPen Inject 7 Units into the skin 3 (three) times daily with meals. Patient taking differently: Inject 8 Units into the skin 3 (three) times daily with meals.  10/31/16 05/16/19 Yes Golden Circle, FNP  Insulin Glargine (LANTUS SOLOSTAR) 100 UNIT/ML Solostar Pen Inject 25 Units into the skin at  bedtime. Patient taking differently: Inject 23 Units into the skin at bedtime.  05/17/17 06/16/17 Yes Elodia Florence., MD  Multiple Vitamin (MULTIVITAMIN WITH MINERALS) TABS tablet Take 1 tablet by mouth daily.   Yes [provider]  naproxen sodium (ALEVE) 220 MG tablet Take 220 mg by mouth 2 (two) times daily as needed (pain/headache).   Yes [provider]  atorvastatin (LIPITOR) 40 MG tablet Take 1 tablet (40 mg total) by mouth daily. Patient not taking: Reported on 06/10/2017 05/17/17 05/17/18  Elodia Florence., MD  blood glucose meter kit and supplies Dispense based on patient and insurance preference. Use up to four times daily as directed. (FOR ICD-9 250.00, 250.01). 10/05/16   Dessa Phi, DO  Insulin Pen Needle 31G X 5 MM MISC Use 4 times daily with insulin 10/05/16   Dessa Phi, DO    Physical Exam: Vitals:   06/10/17 2015 06/10/17 2030 06/10/17 2045 06/10/17 2100  BP: 104/77 105/76 104/79 105/77  Pulse: 95 95 94 89  Resp: 18     Temp:      TempSrc:      SpO2: 100% 100% 100% 100%  Weight:      Height:       General: Not in acute distress. Dry mucus and membrane HEENT:       Eyes: PERRL, EOMI, no scleral icterus.       ENT: No discharge from the ears and nose, no pharynx injection, no tonsillar enlargement.        Neck: No JVD, no bruit, no mass felt. Heme: No neck lymph node enlargement. Cardiac: S1/S2, RRR, No murmurs, No gallops or rubs. Respiratory: No rales, wheezing, rhonchi or rubs. GI: Soft, nondistended, nontender, no rebound pain, no organomegaly, BS present. GU: No hematuria Ext: No pitting leg edema bilaterally. 2+DP/PT pulse bilaterally. Musculoskeletal: No joint deformities, No joint redness or warmth, no limitation of ROM in spin. Skin: No rashes.  Neuro: Alert, oriented X3, cranial nerves II-XII grossly intact, moves all extremities normally.\ Psych: Patient is not psychotic, no suicidal or hemocidal ideation.  Labs on Admission: I have personally reviewed following labs and imaging studies  CBC: Recent Labs  Lab 06/10/17 1732  WBC 5.0  HGB 17.8*  HCT 51.3  MCV 79.9  PLT 737   Basic Metabolic Panel: Recent Labs  Lab 06/10/17 1732 06/10/17 2015  NA 127* 131*  K 3.7 3.9  CL 87* 96*  CO2 21* 20*  GLUCOSE 288* 234*  BUN 15 13  CREATININE 1.13 1.00  CALCIUM 9.8 8.9    GFR: Estimated Creatinine Clearance: 87.3 mL/min (by C-G formula based on SCr of 1 mg/dL). Liver Function Tests: Recent Labs  Lab 06/10/17 1748  AST 20  ALT 17  ALKPHOS 111  BILITOT 1.6*  PROT 8.8*  ALBUMIN 4.9   Recent Labs  Lab 06/10/17 1748  LIPASE 23   No results for input(s): AMMONIA in the last 168 hours. Coagulation Profile: No results for input(s): INR, PROTIME in the last 168 hours. Cardiac Enzymes: No results for input(s): CKTOTAL, CKMB, CKMBINDEX, TROPONINI in the last 168 hours. BNP (last 3 results) No results for input(s): PROBNP in the last 8760 hours. HbA1C: No results for input(s): HGBA1C in the last 72 hours. CBG: Recent Labs  Lab 06/10/17 1726  GLUCAP 311*   Lipid Profile: No results for input(s): CHOL, HDL, LDLCALC, TRIG, CHOLHDL, LDLDIRECT in the last 72 hours. Thyroid Function Tests: No results for input(s): TSH, T4TOTAL, FREET4,  T3FREE, THYROIDAB in the last 72 hours. Anemia Panel: No results for input(s): VITAMINB12, FOLATE, FERRITIN, TIBC, IRON, RETICCTPCT in the last 72 hours. Urine analysis:    Component Value Date/Time   COLORURINE STRAW (A) 05/15/2017 0946   APPEARANCEUR CLEAR 05/15/2017 0946   LABSPEC >=1.030 06/10/2017 1607   PHURINE 6.0 06/10/2017 1607   GLUCOSEU 500 (A) 06/10/2017 1607   HGBUR NEGATIVE 06/10/2017 1607   BILIRUBINUR MODERATE (A) 06/10/2017 1607   KETONESUR 40 (A) 06/10/2017 1607   PROTEINUR 30 (A) 06/10/2017 1607   UROBILINOGEN 0.2 06/10/2017 1607   NITRITE NEGATIVE 06/10/2017 1607   LEUKOCYTESUR NEGATIVE 06/10/2017 1607   Sepsis Labs: '@LABRCNTIP' (procalcitonin:4,lacticidven:4) )No results found for this or any previous visit (from the past 240 hour(s)).   Radiological Exams on Admission: No results found.   EKG:  Not done in ED, will get one.    Assessment/Plan Principal Problem:   Nausea vomiting and diarrhea Active Problems:   HTN (hypertension)   HLD (hyperlipidemia)   Back pain   DKA  (diabetic ketoacidoses) ([facility])   DM type 2 with diabetic peripheral neuropathy ([facility])   Hypokalemia   Dehydration   Nausea vomiting and diarrhea: No abdominal pain most likely due to viral gastroenteritis. Diarrhea has resolved. Patient has positive orthostatic vital sign bleeding physician. Patient is dehydrated. Currently hemodynamically stable  -will place on tele bed for obs -IVF: 3L NS, then 125 cc/h -prn Zofran for nausea and vomiting.  DKA (diabetic ketoacidoses): Initially patient had an elevated anion gap 19, which has closed with IV fluid. -started lanuts and SSI -continue IVF   DM type 2 with diabetic peripheral neuropathy: Last A1c 9.3 on 02/01/17, poorly controled. Patient is taking Lantus and NovoLog at home. Patient states that he has been compliant to take insulin. -will decrease Lantus dose from 23 to 16 U daily -SSI  HTN: not on meds. Bp soft -prn IV hydralazine if her blood pressures is elevated.  HLD: was on lipitor which is discontinued by his PCP. LDL was 51 on 11/27/16.  Hypokalemia: K=3.4  on admission. - Repleted  Chronic Back pain: no alarming symptoms. -Continue home naproxen -When necessary Tylenol -added and when necessary Robaxin   DVT ppx: SQ Lovenox Code Status: Full code Family Communication: None at bed side.     Disposition Plan:  Anticipate discharge back to previous home environment Consults called:  none Admission status: Obs / tele     Date of Service 06/10/2017    Ivor Costa [facility] Pager [phone]  If 7PM-7AM, please contact night-coverage [email or web address] Password [facility] 06/10/2017, 9:57 PM

## 2017-06-10 NOTE — ED Triage Notes (Signed)
Pt here from urgent care for evaluation of 4 days of lumbar/ thoracic pain. States he gets a similar pain when he is in DKA. Pt also states 2-3 episodes of emesis per day for past 4 days. 1 episode of diarrhea. Pt endorses LUQ abdominal pain as well. Some dizziness at times, none at present. No acute distress noted. Tachycardic at triage.

## 2017-06-10 NOTE — Discharge Instructions (Signed)
Ketone are present in your urine.   We recommend that you go to the emergency room for evaluation and treatment as you may be in DKA or early DKA. You likely need IV fluids and insulin.

## 2017-06-10 NOTE — ED Triage Notes (Addendum)
C/O starting with low back "spinal" pain radiating up to thoracic spine x 4 days.  Denies injury.  C/O loss of appetite, feeling lightheaded at times, nausea.  Unsure if fevers; describes "cold chills" initially.  States CBG PTA = 180.

## 2017-06-10 NOTE — ED Provider Notes (Signed)
[facility] EMERGENCY DEPARTMENT Provider Note   CSN: [id number] Arrival date & time: 06/10/17  1658     History   Chief Complaint Chief Complaint  Patient presents with  . Diabetes  . Back Pain  . Emesis    HPI Joshua Escobar is a 47 y.o. male.  HPI  Patient is a 47 year old male with a history of diabetes who presented to the ER after being sent from urgent care with concern for DKA.  On my evaluation patient's main complaint is back pain from his thoracic to lumbar spine that began while he was at work about 4 days ago. He states that he has had similar back pain with DKA in the past. He denies any recent injuries, heavy lifting, falls, or other trauma.  States pain was gradual in onset and rates it at 7/10.  Also reports chills, subjective fevers, lightheadedness, generalized abdominal pain, vomiting (3-4X/day), and diarrhea (x4 today).  Denies any blood in his stool or urine, SOB, chest pain, nausea, URI sxs, urinary or testicular symptoms. Further denies urinary incontinence/retention, no weakness/numbness to BLE, no saddle anesthesia, and no h/o CA or IVDU.   Past Medical History:  Diagnosis Date  . Migraine headache   . Type II diabetes mellitus [facility])     Patient Active Problem List   Diagnosis Date Noted  . Hypokalemia 06/10/2017  . Dehydration 06/10/2017  . DM type 2 with diabetic peripheral neuropathy ([place]) 05/16/2017  . Gastroenteritis 05/16/2017  . Abdominal pain 05/16/2017  . DKA (diabetic ketoacidoses) ([place]) 05/15/2017  . Routine adult health maintenance 02/08/2017  . Atypical chest pain 02/01/2017  . Back pain 12/28/2016  . Diabetic neuropathy ([place]) 12/28/2016  . Generalized abdominal pain 12/28/2016  . Chest pain   . Unstable angina ([place]) 11/25/2016  . Abnormal ECG   . DKA, type 2 ([place]) 10/03/2016  . Acute kidney injury ([place]) 10/03/2016  . HTN (hypertension) 10/03/2016  . Nausea vomiting and diarrhea 10/03/2016  . HLD  (hyperlipidemia) 10/03/2016  . Demand ischemia ([place])   . AKI (acute kidney injury) [facility])     Past Surgical History:  Procedure Laterality Date  . LEFT HEART CATH AND CORONARY ANGIOGRAPHY N/A 11/27/2016   Procedure: Left Heart Cath and Coronary Angiography;  Surgeon: Lorretta Harp, MD;  Location: [place] [facility];  Service: Cardiovascular;  Laterality: N/A;       Home Medications    Prior to Admission medications   Medication Sig Start Date End Date Taking? Authorizing Provider  aspirin EC 81 MG tablet Take 1 tablet (81 mg total) by mouth daily. 05/17/17 06/16/17 Yes Elodia Florence., MD  insulin aspart (NOVOLOG FLEXPEN) 100 UNIT/ML FlexPen Inject 7 Units into the skin 3 (three) times daily with meals. Patient taking differently: Inject 8 Units into the skin 3 (three) times daily with meals.  10/31/16 05/16/19 Yes Golden Circle, FNP  Insulin Glargine (LANTUS SOLOSTAR) 100 UNIT/ML Solostar Pen Inject 25 Units into the skin at bedtime. Patient taking differently: Inject 23 Units into the skin at bedtime.  05/17/17 06/16/17 Yes Elodia Florence., MD  Multiple Vitamin (MULTIVITAMIN WITH MINERALS) TABS tablet Take 1 tablet by mouth daily.   Yes [provider]  naproxen sodium (ALEVE) 220 MG tablet Take 220 mg by mouth 2 (two) times daily as needed (pain/headache).   Yes [provider]  atorvastatin (LIPITOR) 40 MG tablet Take 1 tablet (40 mg total) by mouth daily. Patient not taking: Reported  on 06/10/2017 05/17/17 05/17/18  Elodia Florence., MD  blood glucose meter kit and supplies Dispense based on patient and insurance preference. Use up to four times daily as directed. (FOR ICD-9 250.00, 250.01). 10/05/16   Dessa Phi, DO  Insulin Pen Needle 31G X 5 MM MISC Use 4 times daily with insulin 10/05/16   Dessa Phi, DO    Family History Family History  Problem Relation Age of Onset  . Diabetes Mellitus II Sister   . Diabetes Mellitus II Brother    . Diabetes Mellitus II Mother   . Diabetes Mellitus II Father     Social History Social History   Tobacco Use  . Smoking status: Current Every Day Smoker    Packs/day: 0.50    Years: 20.00    Pack years: 10.00    Types: Cigarettes  . Smokeless tobacco: Never Used  Substance Use Topics  . Alcohol use: Yes    Alcohol/week: 3.6 oz    Types: 6 Cans of beer per week    Comment: weekends only  . Drug use: No     Allergies   Patient has no known allergies.   Review of Systems Review of Systems  Constitutional: Positive for chills.  HENT: Negative for congestion, ear pain, postnasal drip, rhinorrhea, sinus pressure, sinus pain and sore throat.   Eyes: Negative for pain and visual disturbance.  Respiratory: Negative for cough, shortness of breath and wheezing.   Cardiovascular: Negative for chest pain, palpitations and leg swelling.  Gastrointestinal: Positive for abdominal pain, diarrhea, nausea (resolved) and vomiting. Negative for blood in stool and constipation.  Genitourinary: Negative for dysuria, flank pain, frequency, hematuria, penile pain, penile swelling, testicular pain and urgency.  Musculoskeletal: Negative for arthralgias, back pain, gait problem, neck pain and neck stiffness.  Skin: Negative for color change and rash.  Neurological: Positive for light-headedness. Negative for seizures, syncope, weakness, numbness and headaches.  All other systems reviewed and are negative.    Physical Exam Updated Vital Signs BP 100/72   Pulse (!) 101   Temp 98.1 F (36.7 C) (Rectal)   Resp 18   Ht '5\' 8"'  (1.727 m)   Wt 66.2 kg (146 lb)   SpO2 100%   BMI 22.20 kg/m   Physical Exam  Constitutional: He is oriented to person, place, and time. He appears well-developed and well-nourished. No distress.  HENT:  Head: Normocephalic and atraumatic.  Right Ear: External ear normal.  Left Ear: External ear normal.  Eyes: Conjunctivae are normal. No scleral icterus.  Neck:  Normal range of motion. Neck supple.  Normal neck flexion with no pain  Cardiovascular: Normal rate, regular rhythm, normal heart sounds and intact distal pulses.  No murmur heard. Pulmonary/Chest: Effort normal and breath sounds normal. No respiratory distress. He has no wheezes. He has no rales.  Abdominal: Soft. Bowel sounds are normal. He exhibits no distension and no mass. There is tenderness (diffuse, worse in RLQ and LLQ). There is no rebound and no guarding.  No bilat CVA TTP  Musculoskeletal: Normal range of motion. He exhibits no edema.  Midline TTP from thoracic to lumbar spine  Lymphadenopathy:    He has no cervical adenopathy.  Neurological: He is alert and oriented to person, place, and time.  Mental Status:  Alert, thought content appropriate, able to give a coherent history. Speech fluent without evidence of aphasia. Able to follow 2 step commands without difficulty.  Motor:  Normal tone. 5/5 strength of BLE Sensory: light  touch normal in all extremities. No saddle anesthesia, (pt uncomfortable during rectal temp0  Gait: normal gait and balance. CV: 2+ radial and DP/PT pulses  Skin: Skin is warm and dry.  Psychiatric: He has a normal mood and affect.  Nursing note and vitals reviewed.    ED Treatments / Results  Labs (all labs ordered are listed, but only abnormal results are displayed) Labs Reviewed  BASIC METABOLIC PANEL - Abnormal; Notable for the following components:      Result Value   Sodium 127 (*)    Chloride 87 (*)    CO2 21 (*)    Glucose, Bld 288 (*)    Anion gap 19 (*)    All other components within normal limits  CBC - Abnormal; Notable for the following components:   RBC 6.42 (*)    Hemoglobin 17.8 (*)    All other components within normal limits  HEPATIC FUNCTION PANEL - Abnormal; Notable for the following components:   Total Protein 8.8 (*)    Total Bilirubin 1.6 (*)    Indirect Bilirubin 1.5 (*)    All other components within normal  limits  BASIC METABOLIC PANEL - Abnormal; Notable for the following components:   Sodium 131 (*)    Chloride 96 (*)    CO2 20 (*)    Glucose, Bld 234 (*)    All other components within normal limits  CBG MONITORING, ED - Abnormal; Notable for the following components:   Glucose-Capillary 311 (*)    All other components within normal limits  I-STAT VENOUS BLOOD GAS, ED - Abnormal; Notable for the following components:   pO2, Ven 16.0 (*)    Acid-base deficit 3.0 (*)    All other components within normal limits  I-STAT VENOUS BLOOD GAS, ED - Abnormal; Notable for the following components:   pO2, Ven 23.0 (*)    Acid-base deficit 3.0 (*)    All other components within normal limits  LIPASE, BLOOD  URINALYSIS, ROUTINE W REFLEX MICROSCOPIC  BASIC METABOLIC PANEL  CBC    EKG  EKG Interpretation None       Radiology No results found.  Procedures Procedures (including critical care time)  Medications Ordered in ED Medications  ondansetron (ZOFRAN) injection 4 mg (not administered)  aspirin EC tablet 81 mg (not administered)  insulin glargine (LANTUS) injection 16 Units (16 Units Subcutaneous Given 06/10/17 2216)  multivitamin with minerals tablet 1 tablet (not administered)  naproxen sodium (ANAPROX) tablet 275 mg (not administered)  0.9 %  sodium chloride infusion ( Intravenous New Bag/Given 06/10/17 2222)  insulin aspart (novoLOG) injection 0-9 Units (not administered)  enoxaparin (LOVENOX) injection 40 mg (not administered)  acetaminophen (TYLENOL) tablet 650 mg (not administered)    Or  acetaminophen (TYLENOL) suppository 650 mg (not administered)  hydrALAZINE (APRESOLINE) injection 5 mg (not administered)  zolpidem (AMBIEN) tablet 5 mg (not administered)  sodium chloride 0.9 % bolus 1,000 mL (1,000 mLs Intravenous New Bag/Given 06/10/17 2222)  methocarbamol (ROBAXIN) tablet 500 mg (not administered)  sodium chloride 0.9 % bolus 1,000 mL (0 mLs Intravenous Stopped  06/10/17 1901)  ondansetron (ZOFRAN) injection 4 mg (4 mg Intravenous Given 06/10/17 1831)  morphine 4 MG/ML injection 4 mg (4 mg Intravenous Given 06/10/17 1831)  potassium chloride SA (K-DUR,KLOR-CON) CR tablet 20 mEq (20 mEq Oral Given 06/10/17 2027)  sodium chloride 0.9 % bolus 1,000 mL (0 mLs Intravenous Stopped 06/10/17 2220)     Initial Impression / Assessment and Plan / ED Course  I have reviewed the triage vital signs and the nursing notes.  Pertinent labs & imaging results that were available during my care of the patient were reviewed by me and considered in my medical decision making (see chart for details).  Clinical Course as of Jun 10 2232  Nancy Fetter [birth year]  [id number] 47 year old male with history of diabetes and history of going into DKA here with diffuse back pain where he went to the clinic and they found him with an elevated glucose and positive for ketones.  Here the patient states his symptoms feel like what he is in DKA.  His exam is otherwise benign other than some diffuse back pain but no focal distillate and a normal neurologic exam.  His labs are concerning for a But is not particularly acidotic on VBG.  He still remains with soft blood pressures and he will think with the brittleness of his diabetes that he should go home tonight.  [MB]    Clinical Course User Index [MB] Hayden Rasmussen, MD   Discussed pt presentation and exam findings with Dr. Melina Copa, who evaluated the patient and agrees with the plan to obtain lab work and give a fluid bolus.  Repeat labs resulted and anion gap appears to be improving. Discussed with Dr. Melina Copa who agrees to order repeat fluid bolus and give 20 meq K-dur. He agrees with plan for admission.   ReChecked patient.  He is sitting comfortably in bed in no acute distress.  Vital signs stable.  He has tolerated p.o.  He is requesting food.  Discussed results of the lab work and plan for admission to the hospital.  He is comfortable with the plan  and agrees to stay in the hospital.  Sula Rumple, PA, discussed pt case with Dr. Soledad Gerlach who admit the patient to telemetry.   Final Clinical Impressions(s) / ED Diagnoses   Final diagnoses:  Diabetic ketoacidosis without coma associated with type 2 diabetes mellitus [facility])   Patient is a 47 year old male with a history of diabetes who presented to the ER after being sent from urgent care with concern for DKA. Pt hemodynamically stable. Initial BS was 311 and BMP notable for elevated anion gap to 19. Anion gap improved to 15 after 1L fluid bolus. Fluid Bolus was repeated and K was supplemented. Pt is nontoxic appearing and VSS. There is no obvious etiology that may have caused him to go into DKA. States he is compliant with his home insulin regiment. Will admit for further treatment of his DKA.   ED Discharge Orders    None       Bishop Dublin 06/10/17 2234    Hayden Rasmussen, MD 06/10/17 [phone]

## 2017-06-11 DIAGNOSIS — R112 Nausea with vomiting, unspecified: Secondary | ICD-10-CM | POA: Diagnosis not present

## 2017-06-11 DIAGNOSIS — E860 Dehydration: Secondary | ICD-10-CM

## 2017-06-11 DIAGNOSIS — E1110 Type 2 diabetes mellitus with ketoacidosis without coma: Secondary | ICD-10-CM | POA: Diagnosis not present

## 2017-06-11 DIAGNOSIS — R197 Diarrhea, unspecified: Secondary | ICD-10-CM | POA: Diagnosis not present

## 2017-06-11 LAB — BASIC METABOLIC PANEL
Anion gap: 8 (ref 5–15)
BUN: 8 mg/dL (ref 6–20)
CO2: 25 mmol/L (ref 22–32)
Calcium: 8.5 mg/dL — ABNORMAL LOW (ref 8.9–10.3)
Chloride: 101 mmol/L (ref 101–111)
Creatinine, Ser: 0.81 mg/dL (ref 0.61–1.24)
GFR calc Af Amer: >60 mL/min (ref >60)
GFR calc non Af Amer: >60 mL/min (ref >60)
Glucose, Bld: 245 mg/dL — ABNORMAL HIGH (ref 65–99)
Potassium: 3.5 mmol/L (ref 3.5–5.1)
Sodium: 134 mmol/L — ABNORMAL LOW (ref 135–145)

## 2017-06-11 LAB — CBC
HCT: 39.4 % (ref 39.0–52.0)
Hemoglobin: 13 g/dL (ref 13.0–17.0)
MCH: 26.6 pg (ref 26.0–34.0)
MCHC: 33 g/dL (ref 30.0–36.0)
MCV: 80.7 fL (ref 78.0–100.0)
Platelets: 213 10*3/uL (ref 150–400)
RBC: 4.88 MIL/uL (ref 4.22–5.81)
RDW: 14 % (ref 11.5–15.5)
WBC: 4.9 10*3/uL (ref 4.0–10.5)

## 2017-06-11 LAB — GLUCOSE, CAPILLARY
Glucose-Capillary: 170 mg/dL — ABNORMAL HIGH (ref 65–99)
Glucose-Capillary: 311 mg/dL — ABNORMAL HIGH (ref 65–99)
Glucose-Capillary: 371 mg/dL — ABNORMAL HIGH (ref 65–99)

## 2017-06-11 MED ORDER — INSULIN GLARGINE 100 UNIT/ML SOLOSTAR PEN: 23.0000 [IU] | PEN_INJECTOR | Freq: Every day | SUBCUTANEOUS | 0 refills | Status: DC

## 2017-06-11 MED ORDER — INSULIN ASPART 100 UNIT/ML FLEXPEN: 8.0000 [IU] | PEN_INJECTOR | Freq: Three times a day (TID) | SUBCUTANEOUS | 0 refills | Status: DC

## 2017-06-11 NOTE — Progress Notes (Signed)
Joshua Escobar to be D/C'd to home per MD order.  Discussed with the patient and all questions fully answered.  VSS, Skin clean, dry and intact without evidence of skin break down, no evidence of skin tears noted. IV catheter discontinued intact. Site without signs and symptoms of complications. Dressing and pressure applied.  An After Visit Summary was printed and given to the patient. Patient received prescription.  D/c education completed with patient/family including follow up instructions, medication list, d/c activities limitations if indicated, with other d/c instructions as indicated by MD - patient able to verbalize understanding, all questions fully answered.   Patient instructed to return to ED, call 911, or call MD for any changes in condition.   Patient escorted via WC, and D/C home via private auto.  Joshua Escobar 06/11/2017 11:55 AM

## 2017-06-11 NOTE — Plan of Care (Signed)
Patient had an uneventful evening, remaining compliant with interventions. NAD noted.

## 2017-06-11 NOTE — Discharge Summary (Signed)
Physician Discharge Summary  Joshua Escobar BTC:[id number] DOB: 24-Nov-1970 DOA: 06/10/2017  PCP: Golden Circle, FNP  Admit date: 06/10/2017 Discharge date: 06/11/2017   Recommendations for Outpatient Follow-Up:   1. Titration of diabetic medications-- more education as well   Discharge Diagnosis:   Principal Problem:   Nausea vomiting and diarrhea Active Problems:   HTN (hypertension)   HLD (hyperlipidemia)   Back pain   DKA (diabetic ketoacidoses) (HCC)   DM type 2 with diabetic peripheral neuropathy (Joshua Escobar)   Hypokalemia   Dehydration   Discharge disposition:  Home.  Discharge Condition: Improved.  Diet recommendation: Carbohydrate-modified.  Wound care: None.   History of Present Illness:   Joshua Escobar is a 47 y.o. male with medical history significant of hypertension, hyperlipidemia, diabetes mellitus, migraine headaches, tobacco abuse, chronic back pain, who presents with nausea, vomiting, diarrhea.  Patient states that he has been having nausea, vomiting, diarrhea. It is. His diarrhea has resolved today. He vomited 4-5 times today, without blood in the vomitus. Patient denies abdominal pain. No symptoms of UTI. Patient does not have chest pain, shortness of breath, cough, fever or chills. Patient reports worsening lower back pain, which has been going on for several days, initially 7 out of 10 in severity, currently 2 out of 10 in severity. No leg numbness or tingliness. No loss control of bilateral bowel movement. Patient initially had mild DKA with bicarbonate 21 and elevated AG of 19, which has resolved with IV fluid. Repeat BMP showed AG 15 in ED.     Hospital Course by Problem:   Nausea vomiting and diarrhea:  -symptoms resolved  DKA (diabetic ketoacidoses): Initially patient had an elevated anion gap 19, which has closed with IV fluid. -started lanuts and SSI -need close outpatient follow up  DM type 2 with diabetic peripheral neuropathy:  Last A1c 9.3 on 02/01/17, poorly controled. Patient is taking Lantus and NovoLog at home. Patient states that he has been compliant to take insulin.  HLD: was on lipitor which is discontinued by his PCP. LDL was 51 on 11/27/16.  Hypokalemia: K=3.4  on admission. - Repleted  Chronic Back pain: no alarming symptoms. -Continue home naproxen -When necessary Tylenol -added and when necessary Robaxin      Medical Consultants:    None.   Discharge Exam:   Vitals:   06/11/17 0523 06/11/17 1013  BP: 91/62   Pulse: 73   Resp: 12   Temp: 98.2 F (36.8 C)   SpO2: 100% 100%   Vitals:   06/10/17 2215 06/10/17 2246 06/11/17 0523 06/11/17 1013  BP: 100/72 103/69 91/62   Pulse: (!) 101 97 73   Resp: _0 Temp:  99 F (37.2 C) 98.2 F (36.8 C)   TempSrc:  Oral Oral   SpO2: 100% 100% 100% 100%  Weight:      Height:        Gen:  NAD- symptoms resolved    The results of significant diagnostics from this hospitalization (including imaging, microbiology, ancillary and laboratory) are listed below for reference.     Procedures and Diagnostic Studies:   No results found.   Labs:   Basic Metabolic Panel: Recent Labs  Lab 06/10/17 1732 06/10/17 2015 06/11/17 0521  NA 127* 131* 134*  K 3.7 3.9 3.5  CL 87* 96* 101  CO2 21* 20* 25  GLUCOSE 288* 234* 245*  BUN _1 CREATININE 1.13 1.00 0.81  CALCIUM 9.8 8.9 8.5*  GFR Estimated Creatinine Clearance: 107.8 mL/min (by C-G formula based on SCr of 0.81 mg/dL). Liver Function Tests: Recent Labs  Lab 06/10/17 1748  AST 20  ALT 17  ALKPHOS 111  BILITOT 1.6*  PROT 8.8*  ALBUMIN 4.9   Recent Labs  Lab 06/10/17 1748  LIPASE 23   No results for input(s): AMMONIA in the last 168 hours. Coagulation profile No results for input(s): INR, PROTIME in the last 168 hours.  CBC: Recent Labs  Lab 06/10/17 1732 06/11/17 0521  WBC 5.0 4.9  HGB 17.8* 13.0  HCT 51.3 39.4  MCV 79.9 80.7  PLT 254 213    Cardiac Enzymes: No results for input(s): CKTOTAL, CKMB, CKMBINDEX, TROPONINI in the last 168 hours. BNP: Invalid input(s): POCBNP CBG: Recent Labs  Lab 06/10/17 1726 06/11/17 0007 06/11/17 0805  GLUCAP 311* 311* 170*   D-Dimer No results for input(s): DDIMER in the last 72 hours. Hgb A1c No results for input(s): HGBA1C in the last 72 hours. Lipid Profile No results for input(s): CHOL, HDL, LDLCALC, TRIG, CHOLHDL, LDLDIRECT in the last 72 hours. Thyroid function studies No results for input(s): TSH, T4TOTAL, T3FREE, THYROIDAB in the last 72 hours.  Invalid input(s): FREET3 Anemia work up No results for input(s): VITAMINB12, FOLATE, FERRITIN, TIBC, IRON, RETICCTPCT in the last 72 hours. Microbiology No results found for this or any previous visit (from the past 240 hour(s)).   Discharge Instructions:   Discharge Instructions    Diet Carb Modified   Complete by:  As directed    Increase activity slowly   Complete by:  As directed      Allergies as of 06/11/2017   No Known Allergies     Medication List    STOP taking these medications   atorvastatin 40 MG tablet Commonly known as:  LIPITOR   naproxen sodium 220 MG tablet Commonly known as:  ALEVE     TAKE these medications   aspirin EC 81 MG tablet Take 1 tablet (81 mg total) by mouth daily.   blood glucose meter kit and supplies Dispense based on patient and insurance preference. Use up to four times daily as directed. (FOR ICD-9 250.00, 250.01).   insulin aspart 100 UNIT/ML FlexPen Commonly known as:  NOVOLOG FLEXPEN Inject 8 Units into the skin 3 (three) times daily with meals.   Insulin Glargine 100 UNIT/ML Solostar Pen Commonly known as:  LANTUS SOLOSTAR Inject 23 Units into the skin at bedtime.   Insulin Pen Needle 31G X 5 MM Misc Use 4 times daily with insulin   multivitamin with minerals Tabs tablet Take 1 tablet by mouth daily.      Follow-up Information    Golden Circle, FNP  Follow up in 1 week(s).   Specialties:  Family Medicine, Infectious Diseases Contact information: [address] [place] [address] [phone]            Time coordinating discharge: 35 min  Signed:  Geradine Girt   [facility] 06/11/2017, 10:17 AM

## 2017-08-28 ENCOUNTER — Other Ambulatory Visit: Payer: Self-pay

## 2017-08-28 ENCOUNTER — Ambulatory Visit (INDEPENDENT_AMBULATORY_CARE_PROVIDER_SITE_OTHER): Payer: BLUE CROSS/BLUE SHIELD | Admitting: Family Medicine

## 2017-08-28 ENCOUNTER — Encounter: Payer: Self-pay | Admitting: Family Medicine

## 2017-08-28 VITALS — BP 128/64 | HR 100 | Temp 98.3°F | Resp 14 | Ht 68.0 in | Wt 163.0 lb

## 2017-08-28 DIAGNOSIS — F17200 Nicotine dependence, unspecified, uncomplicated: Secondary | ICD-10-CM | POA: Diagnosis not present

## 2017-08-28 DIAGNOSIS — E1142 Type 2 diabetes mellitus with diabetic polyneuropathy: Secondary | ICD-10-CM | POA: Diagnosis not present

## 2017-08-28 DIAGNOSIS — Z23 Encounter for immunization: Secondary | ICD-10-CM

## 2017-08-28 DIAGNOSIS — M545 Low back pain, unspecified: Secondary | ICD-10-CM

## 2017-08-28 DIAGNOSIS — Z720 Tobacco use: Secondary | ICD-10-CM | POA: Insufficient documentation

## 2017-08-28 DIAGNOSIS — G8929 Other chronic pain: Secondary | ICD-10-CM | POA: Diagnosis not present

## 2017-08-28 DIAGNOSIS — Z0000 Encounter for general adult medical examination without abnormal findings: Secondary | ICD-10-CM

## 2017-08-28 MED ORDER — CYCLOBENZAPRINE HCL 10 MG PO TABS: 10.0000 mg | ORAL_TABLET | Freq: Three times a day (TID) | ORAL | 0 refills | Status: DC | PRN

## 2017-08-28 MED ORDER — GABAPENTIN 100 MG PO CAPS: ORAL_CAPSULE | ORAL | 3 refills | Status: DC

## 2017-08-28 NOTE — Patient Instructions (Addendum)
F/U 3 months  Get the xray of your spine- [facility]  [address] [place] [address] Take flexeril as needed for back spasm Take gabapentin 1-2 capsules at bedtime, for nerve pain,start with 1 capsule

## 2017-08-28 NOTE — Assessment & Plan Note (Signed)
Uncontrolled diabetes, with history of DKA, due to non compliance, voiced importance of taking meds Will review chart see why he is not on Metformin or ACEI He not aware of any chronic kidney disease.  Not have any history of heart failure

## 2017-08-28 NOTE — Assessment & Plan Note (Signed)
More MSK pain, on exam > 1 year, obtain xray lumbar spine Given flexeril

## 2017-08-28 NOTE — Assessment & Plan Note (Signed)
counseled on tobacco cessation 

## 2017-08-28 NOTE — Addendum Note (Signed)
Addended by: Phillips Odor on: 08/28/2017 04:31 PM   Modules accepted: Orders

## 2017-08-28 NOTE — Assessment & Plan Note (Signed)
Trial of gabapentin 1-2 capsules at bedtime

## 2017-08-28 NOTE — Progress Notes (Signed)
Subjective:    Patient ID: Joshua Escobar, male    DOB: September 21, 1970, 47 y.o.   MRN: [id number]  Patient presents for [place] CPE (is not fasting)  Pt here to establish and for CPE  Previous PCP - Labuer Elam  Has eye doctor  No other specialist   DM- he was hospitalized in December with DKA, he is a type 2 diabetic. Poorly controlled diabetes noted in hospital discharge with last known A1C of  9.8%  diagnosed 3-4 years   Lantus 24 units, Novolog 8 units , STATES HE WAS NOT TAKING MEDICATION BEFORE  Not sure why he is not on metformin or ACEI HE does have neuropathy, this causes daily pain, but states sometimes feels pain all over    No history of HTN or Hyperipidemia    Smokes- 10 cig/day   Alcohol- 2 beers/  Drinks heavy on weekend up to 6 beers    Oldest daughter 68- has breast cancer       Mother- shot when he was 3   Chronic back pain for past year and half, has physical job with pushing and pulling , wears back brace ,gets muscle spasm, no radiating pain,   Due for TDAP   Review Of Systems:  GEN- denies fatigue, fever, weight loss,weakness, recent illness HEENT- denies eye drainage, change in vision, nasal discharge, CVS- denies chest pain, palpitations RESP- denies SOB, cough, wheeze ABD- denies N/V, change in stools, abd pain GU- denies dysuria, hematuria, dribbling, incontinence MSK- denies joint pain, muscle aches, injury Neuro- denies headache, dizziness, syncope, seizure activity       Objective:    BP 128/64   Pulse 100   Temp 98.3 F (36.8 C) (Oral)   Resp 14   Ht 5\' 8"  (1.727 m)   Wt 163 lb (73.9 kg)   SpO2 98%   BMI 24.78 kg/m  GEN- NAD, alert and oriented x3 HEENT- PERRL, EOMI, non injected sclera, pink conjunctiva, MMM, oropharynx clear Neck- Supple, no thyromegaly CVS- RRR, no murmur RESP-CTAB ABD-NABS,soft,NT,ND EXT- No edema Pulses- Radial, DP- 2+        Assessment & Plan:      Problem List Items Addressed This Visit       Unprioritized   Tobacco use disorder    counseled on tobacco cessation      DM type 2 with diabetic peripheral neuropathy (HCC)    Uncontrolled diabetes, with history of DKA, due to non compliance, voiced importance of taking meds Will review chart see why he is not on Metformin or ACEI He not aware of any chronic kidney disease.  Not have any history of heart failure       Relevant Medications   cyclobenzaprine (FLEXERIL) 10 MG tablet   gabapentin (NEURONTIN) 100 MG capsule   Other Relevant Orders   Hemoglobin A1c   PSA   HM DIABETES FOOT EXAM (Completed)   Diabetic neuropathy (HCC)    Trial of gabapentin 1-2 capsules at bedtime      Relevant Orders   HM DIABETES FOOT EXAM (Completed)   Back pain    More MSK pain, on exam > 1 year, obtain xray lumbar spine Given flexeril      Relevant Medications   cyclobenzaprine (FLEXERIL) 10 MG tablet   Other Relevant Orders   DG Lumbar Spine Complete    Other Visit Diagnoses    Routine general medical examination at a health care facility    -  Primary  CPE done, will review his previous notes, TDAP given, PSA screening   Relevant Orders   CBC with Differential/Platelet   Comprehensive metabolic panel      Note: This dictation was prepared with Dragon dictation along with smaller phrase technology. Any transcriptional errors that result from this process are unintentional.

## 2017-08-29 LAB — COMPREHENSIVE METABOLIC PANEL
AG Ratio: 1.6 (calc) (ref 1.0–2.5)
ALT: 10 U/L (ref 9–46)
AST: 10 U/L (ref 10–40)
Albumin: 4.3 g/dL (ref 3.6–5.1)
Alkaline phosphatase (APISO): 70 U/L (ref 40–115)
BUN: 13 mg/dL (ref 7–25)
CO2: 29 mmol/L (ref 20–32)
Calcium: 9.5 mg/dL (ref 8.6–10.3)
Chloride: 101 mmol/L (ref 98–110)
Creat: 0.99 mg/dL (ref 0.60–1.35)
Globulin: 2.7 g/dL (calc) (ref 1.9–3.7)
Glucose, Bld: 328 mg/dL — ABNORMAL HIGH (ref 65–99)
Potassium: 4.2 mmol/L (ref 3.5–5.3)
Sodium: 139 mmol/L (ref 135–146)
Total Bilirubin: 0.3 mg/dL (ref 0.2–1.2)
Total Protein: 7 g/dL (ref 6.1–8.1)

## 2017-08-29 LAB — CBC WITH DIFFERENTIAL/PLATELET
Basophils Absolute: 37 cells/uL (ref 0–200)
Basophils Relative: 0.6 %
Eosinophils Absolute: 81 cells/uL (ref 15–500)
Eosinophils Relative: 1.3 %
HCT: 45.2 % (ref 38.5–50.0)
Hemoglobin: 14.4 g/dL (ref 13.2–17.1)
Lymphs Abs: 2858 cells/uL (ref 850–3900)
MCH: 26.1 pg — ABNORMAL LOW (ref 27.0–33.0)
MCHC: 31.9 g/dL — ABNORMAL LOW (ref 32.0–36.0)
MCV: 82 fL (ref 80.0–100.0)
MPV: 11.4 fL (ref 7.5–12.5)
Monocytes Relative: 7.1 %
Neutro Abs: 2784 cells/uL (ref 1500–7800)
Neutrophils Relative %: 44.9 %
Platelets: 248 10*3/uL (ref 140–400)
RBC: 5.51 10*6/uL (ref 4.20–5.80)
RDW: 12.3 % (ref 11.0–15.0)
Total Lymphocyte: 46.1 %
WBC mixed population: 440 cells/uL (ref 200–950)
WBC: 6.2 10*3/uL (ref 3.8–10.8)

## 2017-08-29 LAB — HEMOGLOBIN A1C
Hgb A1c MFr Bld: 12.7 % of total Hgb — ABNORMAL HIGH (ref <5.7)
Mean Plasma Glucose: 318 (calc)
eAG (mmol/L): 17.6 (calc)

## 2017-08-29 LAB — PSA: PSA: 0.5 ng/mL (ref <=4.0)

## 2017-08-30 ENCOUNTER — Other Ambulatory Visit: Payer: Self-pay | Admitting: *Deleted

## 2017-08-30 DIAGNOSIS — E1110 Type 2 diabetes mellitus with ketoacidosis without coma: Secondary | ICD-10-CM

## 2017-08-30 DIAGNOSIS — E1142 Type 2 diabetes mellitus with diabetic polyneuropathy: Secondary | ICD-10-CM

## 2017-08-30 MED ORDER — INSULIN GLARGINE 100 UNIT/ML SOLOSTAR PEN: 26.0000 [IU] | PEN_INJECTOR | Freq: Every day | SUBCUTANEOUS | 0 refills | Status: DC

## 2017-09-20 ENCOUNTER — Other Ambulatory Visit: Payer: Self-pay | Admitting: *Deleted

## 2017-09-20 MED ORDER — GABAPENTIN 100 MG PO CAPS: ORAL_CAPSULE | ORAL | 1 refills | Status: DC

## 2017-11-01 ENCOUNTER — Ambulatory Visit (INDEPENDENT_AMBULATORY_CARE_PROVIDER_SITE_OTHER): Payer: BLUE CROSS/BLUE SHIELD | Admitting: Endocrinology

## 2017-11-01 ENCOUNTER — Encounter: Payer: Self-pay | Admitting: Endocrinology

## 2017-11-01 VITALS — BP 114/82 | HR 97 | Wt 158.0 lb

## 2017-11-01 DIAGNOSIS — Z794 Long term (current) use of insulin: Secondary | ICD-10-CM | POA: Diagnosis not present

## 2017-11-01 DIAGNOSIS — IMO0001 Reserved for inherently not codable concepts without codable children: Secondary | ICD-10-CM

## 2017-11-01 DIAGNOSIS — E1165 Type 2 diabetes mellitus with hyperglycemia: Secondary | ICD-10-CM

## 2017-11-01 LAB — POCT GLYCOSYLATED HEMOGLOBIN (HGB A1C): Hemoglobin A1C: 12.4 % — AB (ref 4.0–5.6)

## 2017-11-01 MED ORDER — GLUCOSE BLOOD VI STRP: 1.0000 | ORAL_STRIP | Freq: Four times a day (QID) | 11 refills | Status: DC

## 2017-11-01 MED ORDER — INSULIN GLARGINE 100 UNIT/ML SOLOSTAR PEN: 44.0000 [IU] | PEN_INJECTOR | SUBCUTANEOUS | 11 refills | Status: DC

## 2017-11-01 MED ORDER — INSULIN ASPART 100 UNIT/ML FLEXPEN: 2.0000 [IU] | PEN_INJECTOR | Freq: Three times a day (TID) | SUBCUTANEOUS | 11 refills | Status: DC

## 2017-11-01 NOTE — Progress Notes (Signed)
Subjective:    Patient ID: Joshua Escobar, male    DOB: 04-25-1971, 47 y.o.   MRN: [id number]  HPI pt is referred by Dr Buelah Manis, for diabetes.  Pt states DM was dx'ed in 2015; he has mild if any neuropathy of the lower extremities; he is unaware of any associated chronic complications; he has been on insulin since dx; pt says his diet and exercise are good; he has never had pancreatitis, pancreatic surgery, severe hypoglycemia.  Last episode of DKA was in early 2019.  He says insulin copays are very high.  He works 1st shift, indust [employer].   Past Medical History:  Diagnosis Date  . Migraine headache   . Type II diabetes mellitus ([place])     Past Surgical History:  Procedure Laterality Date  . LEFT HEART CATH AND CORONARY ANGIOGRAPHY N/A 11/27/2016   Procedure: Left Heart Cath and Coronary Angiography;  Surgeon: Lorretta Harp, MD;  Location: [place] [facility];  Service: Cardiovascular;  Laterality: N/A;    Social History   Socioeconomic History  . Marital status: Single    Spouse name: Not on file  . Number of children: 2  . Years of education: 32  . Highest education level: Not on file  Occupational History  . Occupation: Engineer, civil (consulting)  . 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 Financial resource strain: Not hard at all  . Food insecurity:    Worry: Never true    Inability: Never true  . Transportation needs:    Medical: No    Non-medical: No  Tobacco Use  . Smoking status: Current Every Day Smoker    Packs/day: 0.50    Years: 20.00    Pack years: 10.00    Types: Cigarettes  . Smokeless tobacco: Never Used  Substance and Sexual Activity  . Alcohol use: Yes    Alcohol/week: 3.6 oz    Types: 6 Cans of beer per week    Comment: weekends only  . Drug use: No  . Sexual activity: Not Currently  Lifestyle  . Physical activity:    Days per week: 5 days    Minutes per session: 30 min  . Stress: Not at all  Relationships  .  Social connections:    Talks on phone: Not on file    Gets together: Not on file    Attends religious service: Not on file    Active member of club or organization: Not on file    Attends meetings of clubs or organizations: Not on file    Relationship status: Not on file  . Intimate partner violence:    Fear of current or ex partner: Not on file    Emotionally abused: Not on file    Physically abused: Not on file    Forced sexual activity: Not on file  Other Topics Concern  . Not on file  Social History Narrative   Fun/Hobby: Basketball, fish, bowling.     Current Outpatient Medications on File Prior to Visit  Medication Sig Dispense Refill  . cyclobenzaprine (FLEXERIL) 10 MG tablet Take 1 tablet (10 mg total) by mouth 3 (three) times daily as needed for muscle spasms. 20 tablet 0  . gabapentin (NEURONTIN) 100 MG capsule Take 1-2 capsules at bedtime for neuropathy 180 capsule 1  . Multiple Vitamin (MULTIVITAMIN WITH MINERALS) TABS tablet Take 1 tablet by mouth daily.     No current facility-administered medications on file prior  to visit.     No Known Allergies  Family History  Problem Relation Age of Onset  . Diabetes Mellitus II Sister   . Diabetes Sister   . Diabetes Mellitus II Brother   . Diabetes Brother   . Diabetes Mellitus II Mother   . Diabetes Mother   . Diabetes Mellitus II Father   . Diabetes Father   . Heart disease Father     BP 114/82   Pulse 97   Wt 158 lb (71.7 kg)   SpO2 98%   BMI 24.02 kg/m     Review of Systems denies blurry vision, headache, chest pain, sob, n/v, urinary frequency, muscle cramps, excessive diaphoresis, memory loss, depression, cold intolerance, rhinorrhea, and easy bruising.  He has lost weight     Objective:   Physical Exam VS: see vs page GEN: no distress HEAD: head: no deformity eyes: no periorbital swelling, no proptosis external nose and ears are normal mouth: no lesion seen NECK: supple, thyroid is not  enlarged CHEST WALL: no deformity LUNGS: clear to auscultation CV: reg rate and rhythm, no murmur ABD: abdomen is soft, nontender.  no hepatosplenomegaly.  not distended.  no hernia MUSCULOSKELETAL: muscle bulk and strength are grossly normal.  no obvious joint swelling.  gait is normal and steady EXTEMITIES: no deformity.  no ulcer on the feet.  feet are of normal color and temp.  no edema.  There is bilateral onychomycosis of the toenails PULSES: dorsalis pedis intact bilat.  no carotid bruit NEURO:  cn 2-12 grossly intact.   readily moves all 4's.  sensation is intact to touch on the feet SKIN:  Normal texture and temperature.  No rash or suspicious lesion is visible.   NODES:  None palpable at the neck.   PSYCH: alert, well-oriented.  Does not appear anxious nor depressed.     Lab Results  Component Value Date   HGBA1C 12.4 (A) 11/01/2017   Lab Results  Component Value Date   CREATININE 0.99 08/28/2017   BUN 13 08/28/2017   NA 139 08/28/2017   K 4.2 08/28/2017   CL 101 08/28/2017   CO2 29 08/28/2017    I have reviewed outside records, and summarized: Pt was noted to have elevated a1c, and referred here.  I personally reviewed electrocardiogram tracing (05/15/17): Indication: vomiting Impression: ST.  No MI.  LAE.  LVH Compared to 12/24/17: ST is new     Assessment & Plan:  Type 1 DM, new to me, severe exacerbation.  I gave discount cards, to help with copays.     Patient Instructions  good diet and exercise significantly improve the control of your diabetes.  please let me know if you wish to be referred to a dietician.  high blood sugar is very risky to your health.  you should see an eye doctor and dentist every year.  It is very important to get all recommended vaccinations.  Controlling your blood pressure and cholesterol drastically reduces the damage diabetes does to your body.  Those who smoke should quit.  Please discuss these with your doctor.  check your blood  sugar 4 times a day: before the 3 meals, and at bedtime.  also check if you have symptoms of your blood sugar being too high or too low.  please keep a record of the readings and bring it to your next appointment here (or you can bring the meter itself).  You can write it on any piece of paper.  please  call us sooner if your blood sugar goes below 70, or if you have a lot of readings over 200. Please change the insulins to the numbers below: here 2 are 2 new meters.  I have sent a prescription to your pharmacy, for strips. Please come back for a follow-up appointment in 2 months.

## 2017-11-01 NOTE — Patient Instructions (Signed)
good diet and exercise significantly improve the control of your diabetes.  please let me know if you wish to be referred to a dietician.  high blood sugar is very risky to your health.  you should see an eye doctor and dentist every year.  It is very important to get all recommended vaccinations.  Controlling your blood pressure and cholesterol drastically reduces the damage diabetes does to your body.  Those who smoke should quit.  Please discuss these with your doctor.  check your blood sugar 4 times a day: before the 3 meals, and at bedtime.  also check if you have symptoms of your blood sugar being too high or too low.  please keep a record of the readings and bring it to your next appointment here (or you can bring the meter itself).  You can write it on any piece of paper.  please call us sooner if your blood sugar goes below 70, or if you have a lot of readings over 200. Please change the insulins to the numbers below: here 2 are 2 new meters.  I have sent a prescription to your pharmacy, for strips. Please come back for a follow-up appointment in 2 months.

## 2017-11-08 ENCOUNTER — Telehealth: Payer: Self-pay | Admitting: *Deleted

## 2017-11-08 NOTE — Telephone Encounter (Signed)
Received fax requesting alternative to Lantus Solostar.   Formulary alternatives include: Evaristo Bury FlexTouch Elon Jester  MD please advise.

## 2017-11-09 NOTE — Telephone Encounter (Signed)
He needs to all Dr. Everardo All who is his endocrinologist, to see if he has a preference If he is out right now, he can get a sample of basaglar if we have temporarily I saw where ellison saw him a week ago and gave him coupon cards etc

## 2017-11-09 NOTE — Telephone Encounter (Signed)
Will forward to Dr. Everardo All, Endocrionology.

## 2017-11-13 ENCOUNTER — Other Ambulatory Visit: Payer: Self-pay

## 2017-11-13 MED ORDER — BASAGLAR KWIKPEN 100 UNIT/ML ~~LOC~~ SOPN: PEN_INJECTOR | SUBCUTANEOUS | 4 refills | Status: DC

## 2017-11-13 NOTE — Telephone Encounter (Signed)
basaglar is ok, same dosage.

## 2017-11-13 NOTE — Telephone Encounter (Signed)
I have changed to basaglar per Dr. Barnie Mort & insurance request.

## 2018-01-07 ENCOUNTER — Ambulatory Visit: Payer: BLUE CROSS/BLUE SHIELD | Admitting: Endocrinology

## 2018-01-16 IMAGING — CT CT ABD-PELV W/ CM
2 of 5 series · 16 of 46 positions shown, 18 images · IV contrast (ISOVUE)
Comparison: 11/21/2016

CLINICAL DATA: Vomiting and left lower quadrant abdominal pain for
6 days.

EXAM:
CT ABDOMEN AND PELVIS WITH CONTRAST
TECHNIQUE: Multidetector CT imaging of the abdomen and pelvis was performed
using the standard protocol following bolus administration of
intravenous contrast.
CONTRAST:  100mL NKVVCO-5WW IOPAMIDOL (NKVVCO-5WW) INJECTION 61%

[Series 2: abd/pel with · axial · 0.69mm/px · z∈[-880,-486]mm · 13 of 91 slices shown, 15 images]
[im 6/91  soft-tissue]
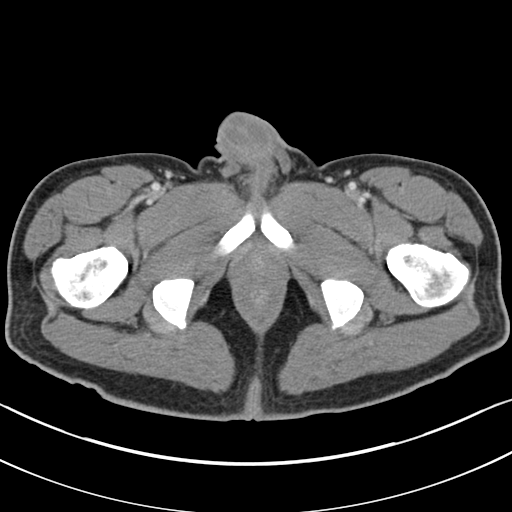
[im 6/91  bone]
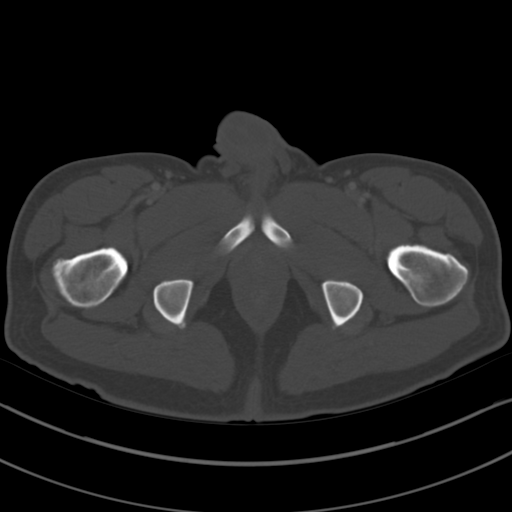
[im 11/91  soft-tissue]
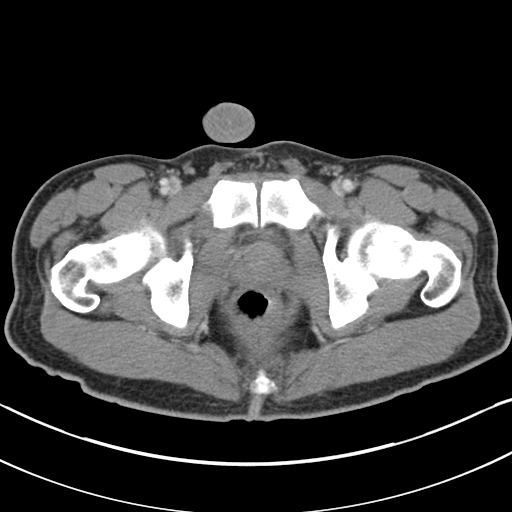
[im 22/91  soft-tissue]
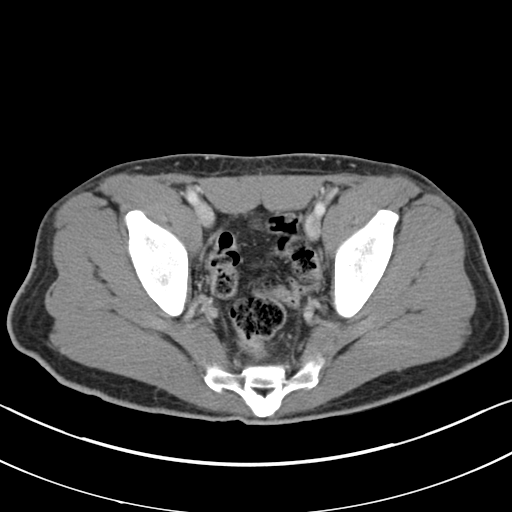
[im 27/91  soft-tissue]
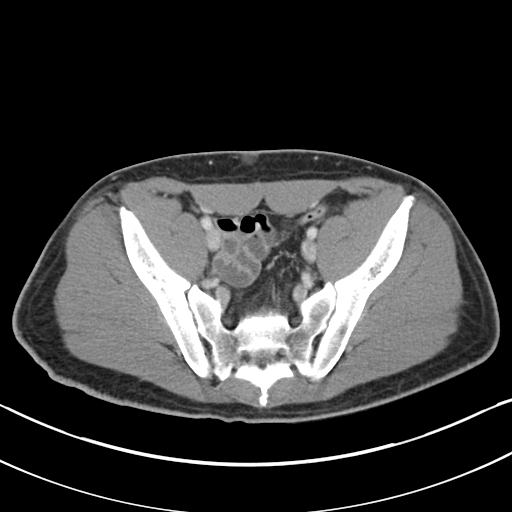
[im 32/91  soft-tissue]
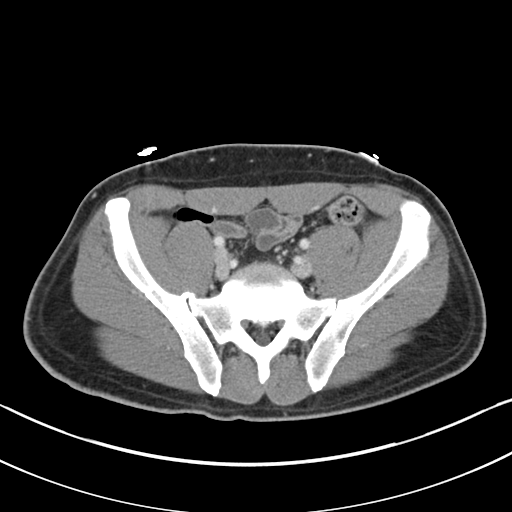
[im 38/91  soft-tissue]
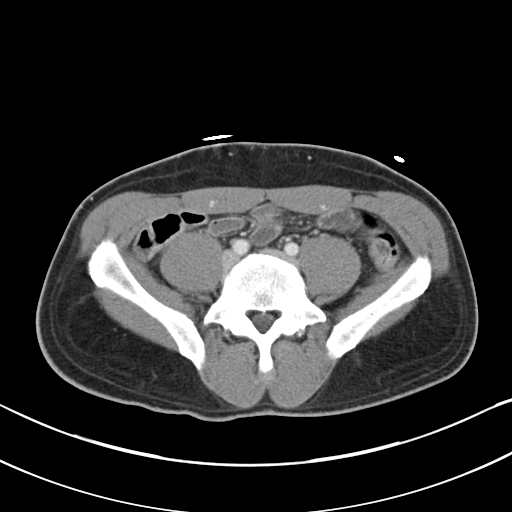
[im 48/91  soft-tissue]
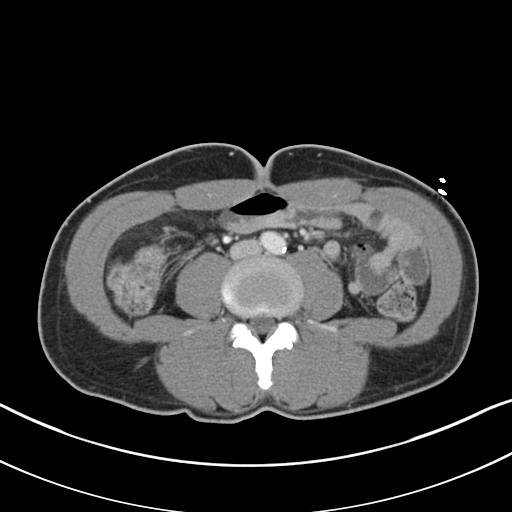
[im 53/91  soft-tissue]
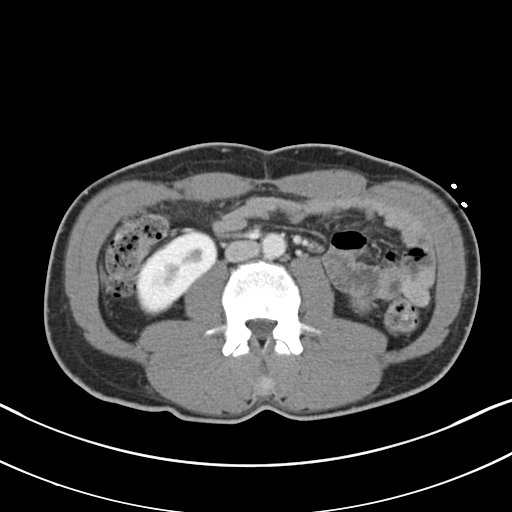
[im 59/91  soft-tissue]
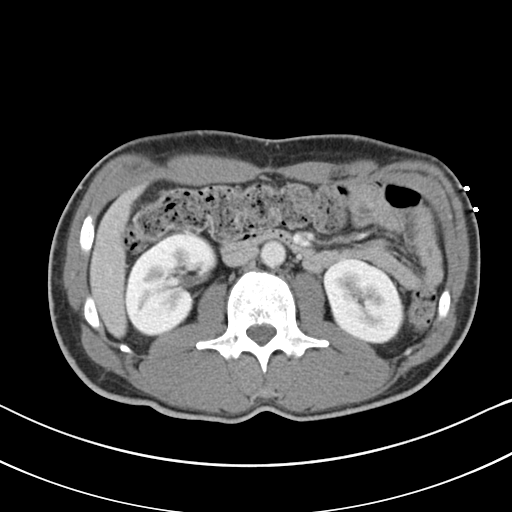
[im 59/91  bone]
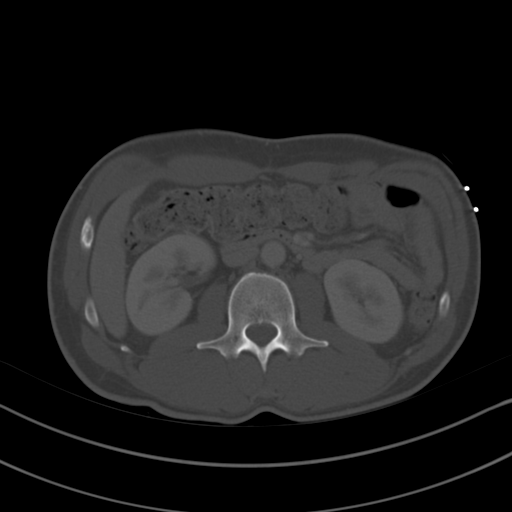
[im 64/91  soft-tissue]
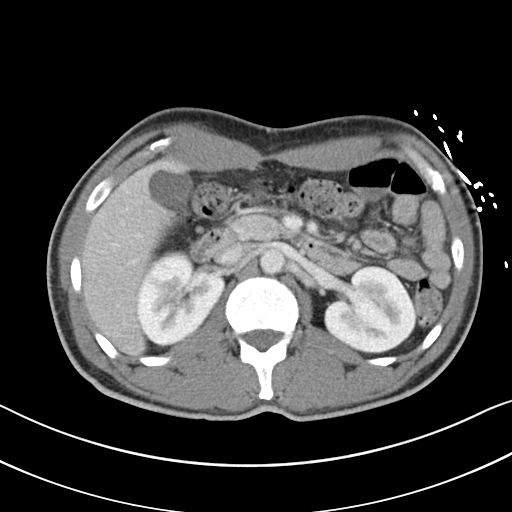
[im 69/91  soft-tissue]
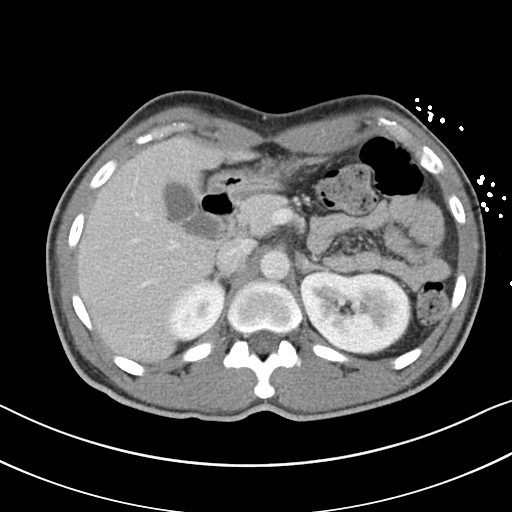
[im 80/91  soft-tissue]
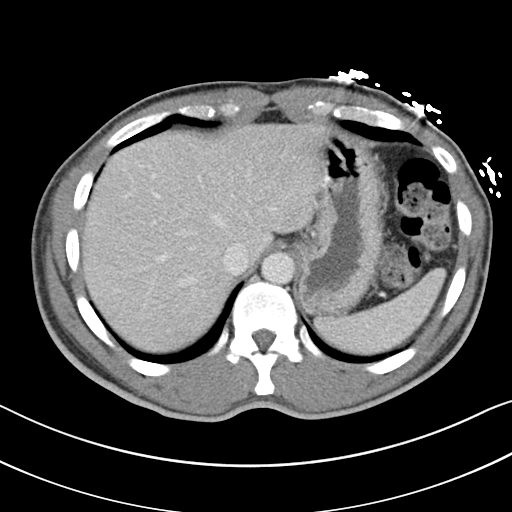
[im 85/91  soft-tissue]
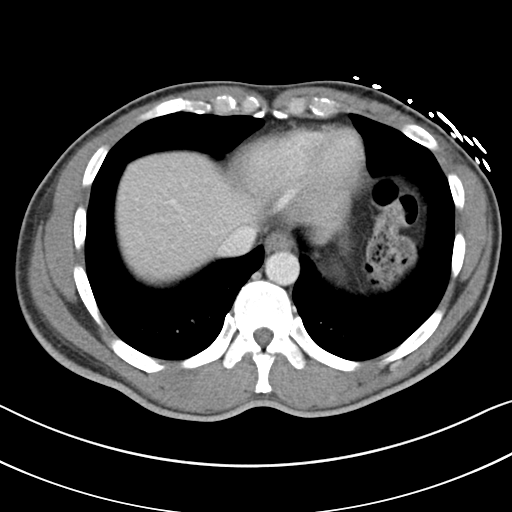

[Series 3: coronal a/|p · coronal · 0.73mm/px · 3 of 122 slices shown]
[im 41/122  soft-tissue]
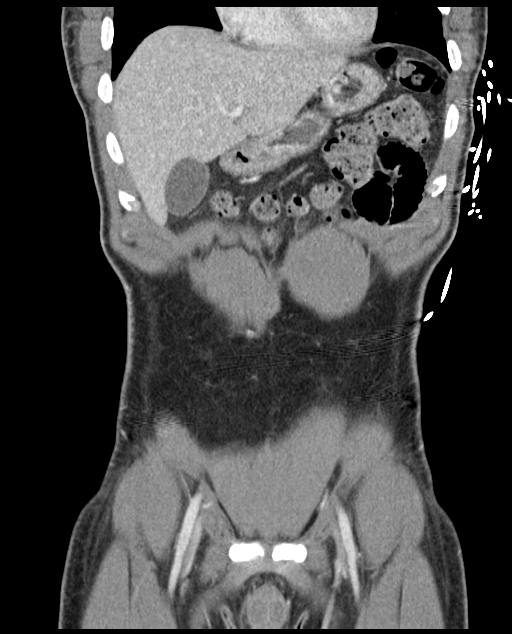
[im 54/122  soft-tissue]
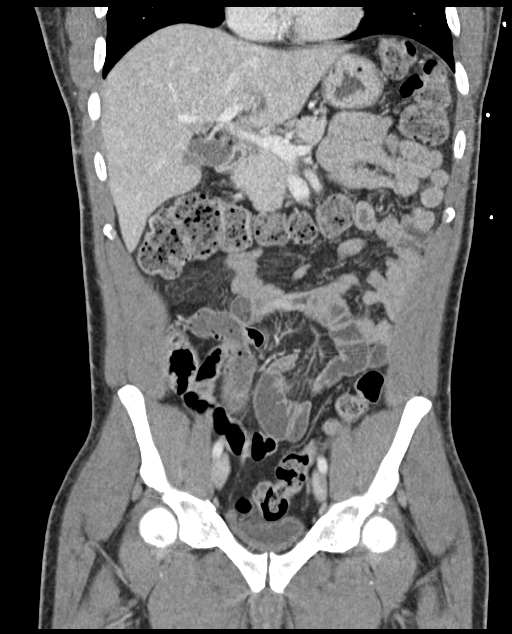
[im 68/122  soft-tissue]
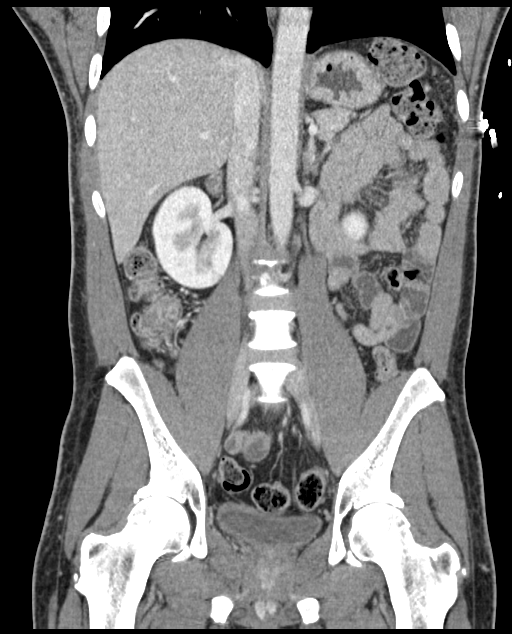

[16 of 46 positions shown; findings below may reference images not displayed]

FINDINGS: Lower chest: No acute abnormality.

Hepatobiliary: No focal liver abnormality is seen. No gallstones,
gallbladder wall thickening, or biliary dilatation.

Pancreas: Unremarkable. No pancreatic ductal dilatation or
surrounding inflammatory changes.

Spleen: Normal in size without focal abnormality.

Adrenals/Urinary Tract: Adrenal glands are unremarkable. Kidneys are
normal, without renal calculi, focal lesion, or hydronephrosis.
Bladder is unremarkable.

Stomach/Bowel: Stomach is within normal limits. Appendix is normal.
No evidence of bowel wall thickening, distention, or inflammatory
changes.

Vascular/Lymphatic: The abdominal aorta is normal in caliber with
mild atherosclerotic calcification. No adenopathy in the abdomen or
pelvis.

Reproductive: Unremarkable

Other: 1.5 cm subcutaneous lesion in the low midline anterior
abdominal wall, probably an incidental sebaceous cyst.

Musculoskeletal: No significant skeletal lesion.
IMPRESSION: No significant abnormality.

## 2018-01-18 ENCOUNTER — Encounter: Payer: Self-pay | Admitting: Endocrinology

## 2018-01-18 ENCOUNTER — Other Ambulatory Visit: Payer: Self-pay

## 2018-01-18 ENCOUNTER — Ambulatory Visit (INDEPENDENT_AMBULATORY_CARE_PROVIDER_SITE_OTHER): Payer: BLUE CROSS/BLUE SHIELD | Admitting: Endocrinology

## 2018-01-18 VITALS — BP 102/78 | HR 99 | Ht 68.0 in | Wt 157.0 lb

## 2018-01-18 DIAGNOSIS — E1165 Type 2 diabetes mellitus with hyperglycemia: Secondary | ICD-10-CM

## 2018-01-18 DIAGNOSIS — Z794 Long term (current) use of insulin: Secondary | ICD-10-CM

## 2018-01-18 DIAGNOSIS — IMO0001 Reserved for inherently not codable concepts without codable children: Secondary | ICD-10-CM

## 2018-01-18 LAB — POCT GLYCOSYLATED HEMOGLOBIN (HGB A1C): Hemoglobin A1C: 11.6 % — AB (ref 4.0–5.6)

## 2018-01-18 MED ORDER — BASAGLAR KWIKPEN 100 UNIT/ML ~~LOC~~ SOPN: PEN_INJECTOR | SUBCUTANEOUS | 3 refills | Status: DC

## 2018-01-18 MED ORDER — INSULIN GLARGINE 100 UNIT/ML SOLOSTAR PEN: 44.0000 [IU] | PEN_INJECTOR | SUBCUTANEOUS | 99 refills | Status: DC

## 2018-01-18 NOTE — Patient Instructions (Addendum)
check your blood sugar 4 times a day: before the 3 meals, and at bedtime.  also check if you have symptoms of your blood sugar being too high or too low.  please keep a record of the readings and bring it to your next appointment here (or you can bring the meter itself).  You can write it on any piece of paper.  please call us sooner if your blood sugar goes below 70, or if you have a lot of readings over 200. A different type of diabetes blood tests are requested for you today.  We'll let you know about the results.   Please continue the same insulins.  Please come back for a follow-up appointment in 2 months.

## 2018-01-18 NOTE — Progress Notes (Signed)
Subjective:    Patient ID: Joshua Escobar, male    DOB: [birth year], 47 y.o.   MRN: [id number]  HPI Pt returns for f/u of diabetes mellitus:  DM type: 1 Dx'ed: 6389 Complications: polyneuropathy Therapy: insulin since dx.   DKA: Last episode was in early 2019 Severe hypoglycemia: never Pancreatitis: never Pancreatic imaging: normal on 2018 CT Other: He works 1st shift, [employer]. Interval history: Pt says he sometimes misses the insulin doses.  no cbg record, but states cbg's vary from 101-180.  pt states he feels better in general, on the new regimen.   Past Medical History:  Diagnosis Date  . Migraine headache   . Type II diabetes mellitus ([place])     Past Surgical History:  Procedure Laterality Date  . LEFT HEART CATH AND CORONARY ANGIOGRAPHY N/A 11/27/2016   Procedure: Left Heart Cath and Coronary Angiography;  Surgeon: Lorretta Harp, MD;  Location: [facility];  Service: Cardiovascular;  Laterality: N/A;    Social History   Socioeconomic History  . Marital status: Single    Spouse name: Not on file  . Number of children: 2  . Years of education: 63  . Highest education level: Not on file  Occupational History  . Occupation: Engineer, civil (consulting)  . Occupation: BUILF ATM MACHINES    Employer: [employer]  Social Needs  . Financial resource strain: Not hard at all  . Food insecurity:    Worry: Never true    Inability: Never true  . Transportation needs:    Medical: No    Non-medical: No  Tobacco Use  . Smoking status: Current Every Day Smoker    Packs/day: 0.50    Years: 20.00    Pack years: 10.00    Types: Cigarettes  . Smokeless tobacco: Never Used  Substance and Sexual Activity  . Alcohol use: Yes    Alcohol/week: 6.0 standard drinks    Types: 6 Cans of beer per week    Comment: weekends only  . Drug use: No  . Sexual activity: Not Currently  Lifestyle  . Physical activity:    Days per week: 5 days    Minutes per session: 30 min  .  Stress: Not at all  Relationships  . Social connections:    Talks on phone: Not on file    Gets together: Not on file    Attends religious service: Not on file    Active member of club or organization: Not on file    Attends meetings of clubs or organizations: Not on file    Relationship status: Not on file  . Intimate partner violence:    Fear of current or ex partner: Not on file    Emotionally abused: Not on file    Physically abused: Not on file    Forced sexual activity: Not on file  Other Topics Concern  . Not on file  Social History Narrative   Fun/Hobby: Basketball, fish, bowling.     Current Outpatient Medications on File Prior to Visit  Medication Sig Dispense Refill  . gabapentin (NEURONTIN) 100 MG capsule Take 1-2 capsules at bedtime for neuropathy 180 capsule 1  . glucose blood (CONTOUR TEST) test strip 1 each by Other route 4 (four) times daily. And lancets 4/day 120 each 11  . insulin aspart (NOVOLOG FLEXPEN) 100 UNIT/ML FlexPen Inject 2 Units into the skin 3 (three) times daily with meals. 5 pen 11  . Multiple Vitamin (MULTIVITAMIN WITH MINERALS) TABS tablet  Take 1 tablet by mouth daily.    . cyclobenzaprine (FLEXERIL) 10 MG tablet Take 1 tablet (10 mg total) by mouth 3 (three) times daily as needed for muscle spasms. (Patient not taking: Reported on 01/18/2018) 20 tablet 0   No current facility-administered medications on file prior to visit.     No Known Allergies  Family History  Problem Relation Age of Onset  . Diabetes Mellitus II Sister   . Diabetes Sister   . Diabetes Mellitus II Brother   . Diabetes Brother   . Diabetes Mellitus II Mother   . Diabetes Mother   . Diabetes Mellitus II Father   . Diabetes Father   . Heart disease Father    BP 102/78 (BP Location: Left Arm, Patient Position: Sitting, Cuff Size: Normal)   Pulse 99   Ht 5' 8" (1.727 m)   Wt 157 lb (71.2 kg)   SpO2 97%   BMI 23.87 kg/m   Review of Systems He denies hypoglycemia.        Objective:   Physical Exam VITAL SIGNS:  See vs page GENERAL: no distress Pulses: foot pulses are intact bilaterally.   MSK: no deformity of the feet or ankles.  CV: no edema of the legs or ankles Skin:  no ulcer on the feet or ankles, but the skin is dry.  normal color and temp on the feet and ankles Neuro: sensation is intact to touch on the feet and ankles.   Ext: There is bilateral onychomycosis of the toenails.    Lab Results  Component Value Date   HGBA1C 11.6 (A) 01/18/2018      Assessment & Plan:  Type 1 DM: glycemic control is apparently improved.  Check fructosamine.   Missing insulin doses: he may need to change to tresiba, for dosing flexibility, but we'll continue the same for now.    Patient Instructions  check your blood sugar 4 times a day: before the 3 meals, and at bedtime.  also check if you have symptoms of your blood sugar being too high or too low.  please keep a record of the readings and bring it to your next appointment here (or you can bring the meter itself).  You can write it on any piece of paper.  please call us sooner if your blood sugar goes below 70, or if you have a lot of readings over 200. A different type of diabetes blood tests are requested for you today.  We'll let you know about the results.   Please continue the same insulins.  Please come back for a follow-up appointment in 2 months.

## 2018-01-21 LAB — FRUCTOSAMINE: Fructosamine: 559 umol/L — ABNORMAL HIGH (ref 190–270)

## 2018-02-09 ENCOUNTER — Other Ambulatory Visit: Payer: Self-pay | Admitting: Endocrinology

## 2018-03-21 ENCOUNTER — Encounter: Payer: Self-pay | Admitting: Endocrinology

## 2018-03-21 ENCOUNTER — Ambulatory Visit (INDEPENDENT_AMBULATORY_CARE_PROVIDER_SITE_OTHER): Payer: BLUE CROSS/BLUE SHIELD | Admitting: Endocrinology

## 2018-03-21 VITALS — BP 114/68 | HR 103 | Ht 68.0 in | Wt 172.0 lb

## 2018-03-21 DIAGNOSIS — E1165 Type 2 diabetes mellitus with hyperglycemia: Secondary | ICD-10-CM | POA: Diagnosis not present

## 2018-03-21 DIAGNOSIS — Z23 Encounter for immunization: Secondary | ICD-10-CM

## 2018-03-21 DIAGNOSIS — IMO0001 Reserved for inherently not codable concepts without codable children: Secondary | ICD-10-CM

## 2018-03-21 DIAGNOSIS — Z794 Long term (current) use of insulin: Secondary | ICD-10-CM | POA: Diagnosis not present

## 2018-03-21 LAB — POCT GLYCOSYLATED HEMOGLOBIN (HGB A1C): Hemoglobin A1C: 9.5 % — AB (ref 4.0–5.6)

## 2018-03-21 MED ORDER — BASAGLAR KWIKPEN 100 UNIT/ML ~~LOC~~ SOPN: 35.0000 [IU] | PEN_INJECTOR | SUBCUTANEOUS | 11 refills | Status: DC

## 2018-03-21 NOTE — Progress Notes (Signed)
Subjective:    Patient ID: Joshua Escobar, male    DOB: 12/03/1970, 47 y.o.   MRN: [id number]  HPI Pt returns for f/u of diabetes mellitus:  DM type: 1 Dx'ed: 9983 Complications: polyneuropathy Therapy: insulin since dx.   DKA: Last episode was in early 2019 Severe hypoglycemia: never Pancreatitis: never Pancreatic imaging: normal on 2018 CT Other: He works 1st shift, [employer]; A1c has been verified by fructosamine; he takes multiple daily injections, but basal insulin is emphasized, due to very high A1c, and missing insulin doses   Interval history: Pt says he reduced the basaglar to 30 units qam, due to hypoglycemia.  He says he never misses the basaglar, but he does not take .  no cbg record, but states cbg's vary from 101-180.  pt states he feels better in general, on the new regimen.  Past Medical History:  Diagnosis Date  . Migraine headache   . Type II diabetes mellitus ([place])     Past Surgical History:  Procedure Laterality Date  . LEFT HEART CATH AND CORONARY ANGIOGRAPHY N/A 11/27/2016   Procedure: Left Heart Cath and Coronary Angiography;  Surgeon: Lorretta Harp, MD;  Location: [facility];  Service: Cardiovascular;  Laterality: N/A;    Social History   Socioeconomic History  . Marital status: Single    Spouse name: Not on file  . Number of children: 2  . Years of education: 28  . Highest education level: Not on file  Occupational History  . Occupation: Engineer, civil (consulting)  . 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 Financial resource strain: Not hard at all  . Food insecurity:    Worry: Never true    Inability: Never true  . Transportation needs:    Medical: No    Non-medical: No  Tobacco Use  . Smoking status: Current Every Day Smoker    Packs/day: 0.50    Years: 20.00    Pack years: 10.00    Types: Cigarettes  . Smokeless tobacco: Never Used  Substance and Sexual Activity  . Alcohol use: Yes   Alcohol/week: 6.0 standard drinks    Types: 6 Cans of beer per week    Comment: weekends only  . Drug use: No  . Sexual activity: Not Currently  Lifestyle  . Physical activity:    Days per week: 5 days    Minutes per session: 30 min  . Stress: Not at all  Relationships  . Social connections:    Talks on phone: Not on file    Gets together: Not on file    Attends religious service: Not on file    Active member of club or organization: Not on file    Attends meetings of clubs or organizations: Not on file    Relationship status: Not on file  . Intimate partner violence:    Fear of current or ex partner: Not on file    Emotionally abused: Not on file    Physically abused: Not on file    Forced sexual activity: Not on file  Other Topics Concern  . Not on file  Social History Narrative   Fun/Hobby: Basketball, fish, bowling.     Current Outpatient Medications on File Prior to Visit  Medication Sig Dispense Refill  . cyclobenzaprine (FLEXERIL) 10 MG tablet Take 1 tablet (10 mg total) by mouth 3 (three) times daily as needed for muscle spasms. 20 tablet 0  . gabapentin (  NEURONTIN) 100 MG capsule Take 1-2 capsules at bedtime for neuropathy 180 capsule 1  . glucose blood (CONTOUR TEST) test strip 1 each by Other route 4 (four) times daily. And lancets 4/day 120 each 11  . Multiple Vitamin (MULTIVITAMIN WITH MINERALS) TABS tablet Take 1 tablet by mouth daily.     No current facility-administered medications on file prior to visit.     No Known Allergies  Family History  Problem Relation Age of Onset  . Diabetes Mellitus II Sister   . Diabetes Sister   . Diabetes Mellitus II Brother   . Diabetes Brother   . Diabetes Mellitus II Mother   . Diabetes Mother   . Diabetes Mellitus II Father   . Diabetes Father   . Heart disease Father     BP 114/68   Pulse (!) 103   Ht '5\' 8"'  (1.727 m)   Wt 172 lb (78 kg)   SpO2 98%   BMI 26.15 kg/m    Review of Systems He denies LOC     Objective:   Physical Exam VITAL SIGNS:  See vs page GENERAL: no distress Pulses: foot pulses are intact bilaterally.   MSK: no deformity of the feet or ankles.  CV: no edema of the legs or ankles.   Skin:  no ulcer on the feet or ankles, but the skin is dry.  normal color and temp on the feet and ankles Neuro: sensation is intact to touch on the feet and ankles.   Ext: There is bilateral onychomycosis of the toenails.    Lab Results  Component Value Date   HGBA1C 9.5 (A) 03/21/2018       Assessment & Plan:  Type 1 DM, with polyneuropathy: he needs increased rx.   Patient Instructions  check your blood sugar 4 times a day: before the 3 meals, and at bedtime.  also check if you have symptoms of your blood sugar being too high or too low.  please keep a record of the readings and bring it to your next appointment here (or you can bring the meter itself).  You can write it on any piece of paper.  please call us sooner if your blood sugar goes below 70, or if you have a lot of readings over 200. Please increase the basaglar to 35 units each morning. On this type of insulin schedule, you should eat meals on a regular schedule.  If a meal is missed or significantly delayed, your blood sugar could go low.   Please come back for a follow-up appointment in 3 months.

## 2018-03-21 NOTE — Patient Instructions (Addendum)
check your blood sugar 4 times a day: before the 3 meals, and at bedtime.  also check if you have symptoms of your blood sugar being too high or too low.  please keep a record of the readings and bring it to your next appointment here (or you can bring the meter itself).  You can write it on any piece of paper.  please call us sooner if your blood sugar goes below 70, or if you have a lot of readings over 200. Please increase the basaglar to 35 units each morning. On this type of insulin schedule, you should eat meals on a regular schedule.  If a meal is missed or significantly delayed, your blood sugar could go low.   Please come back for a follow-up appointment in 3 months.

## 2018-04-23 ENCOUNTER — Other Ambulatory Visit: Payer: Self-pay | Admitting: Endocrinology

## 2018-06-20 ENCOUNTER — Encounter: Payer: Self-pay | Admitting: Endocrinology

## 2018-06-20 ENCOUNTER — Ambulatory Visit: Payer: BLUE CROSS/BLUE SHIELD | Admitting: Endocrinology

## 2018-06-20 VITALS — BP 126/80 | HR 102 | Ht 68.0 in | Wt 176.4 lb

## 2018-06-20 DIAGNOSIS — Z794 Long term (current) use of insulin: Secondary | ICD-10-CM | POA: Diagnosis not present

## 2018-06-20 DIAGNOSIS — N529 Male erectile dysfunction, unspecified: Secondary | ICD-10-CM | POA: Diagnosis not present

## 2018-06-20 DIAGNOSIS — E1165 Type 2 diabetes mellitus with hyperglycemia: Secondary | ICD-10-CM

## 2018-06-20 DIAGNOSIS — IMO0001 Reserved for inherently not codable concepts without codable children: Secondary | ICD-10-CM

## 2018-06-20 LAB — POCT GLYCOSYLATED HEMOGLOBIN (HGB A1C): Hemoglobin A1C: 12.4 % — AB (ref 4.0–5.6)

## 2018-06-20 MED ORDER — INSULIN DEGLUDEC 100 UNIT/ML ~~LOC~~ SOPN: 35.0000 [IU] | PEN_INJECTOR | Freq: Every day | SUBCUTANEOUS | 11 refills | Status: DC

## 2018-06-20 MED ORDER — SILDENAFIL CITRATE 100 MG PO TABS: 100.0000 mg | ORAL_TABLET | Freq: Every day | ORAL | 11 refills | Status: DC | PRN

## 2018-06-20 NOTE — Progress Notes (Signed)
Subjective:    Patient ID: Joshua Escobar, male    DOB: [birth year], 48 y.o.   MRN: [id number]  HPI Pt returns for f/u of diabetes mellitus:  DM type: 1 Dx'ed: 8110 Complications: polyneuropathy Therapy: insulin since dx.   DKA: Last episode was in early 2019 Severe hypoglycemia: never Pancreatitis: never Pancreatic imaging: normal on 2018 CT Other: He works 1st shift, [employer]; A1c has been verified by fructosamine; he takes QD insulin, after poor results with multiple daily injections.  Interval history: He says he sometimes misses the basaglar, because he is on his way to work.  pt states he feels well in general.   Past Medical History:  Diagnosis Date  . Migraine headache   . Type II diabetes mellitus ([place])     Past Surgical History:  Procedure Laterality Date  . LEFT HEART CATH AND CORONARY ANGIOGRAPHY N/A 11/27/2016   Procedure: Left Heart Cath and Coronary Angiography;  Surgeon: Lorretta Harp, MD;  Location: [facility];  Service: Cardiovascular;  Laterality: N/A;    Social History   Socioeconomic History  . Marital status: Single    Spouse name: Not on file  . Number of children: 2  . Years of education: 46  . Highest education level: Not on file  Occupational History  . Occupation: Engineer, civil (consulting)  . 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 Financial resource strain: Not hard at all  . Food insecurity:    Worry: Never true    Inability: Never true  . Transportation needs:    Medical: No    Non-medical: No  Tobacco Use  . Smoking status: Current Every Day Smoker    Packs/day: 0.50    Years: 20.00    Pack years: 10.00    Types: Cigarettes  . Smokeless tobacco: Never Used  Substance and Sexual Activity  . Alcohol use: Yes    Alcohol/week: 6.0 standard drinks    Types: 6 Cans of beer per week    Comment: weekends only  . Drug use: No  . Sexual activity: Not Currently  Lifestyle  . Physical activity:     Days per week: 5 days    Minutes per session: 30 min  . Stress: Not at all  Relationships  . Social connections:    Talks on phone: Not on file    Gets together: Not on file    Attends religious service: Not on file    Active member of club or organization: Not on file    Attends meetings of clubs or organizations: Not on file    Relationship status: Not on file  . Intimate partner violence:    Fear of current or ex partner: Not on file    Emotionally abused: Not on file    Physically abused: Not on file    Forced sexual activity: Not on file  Other Topics Concern  . Not on file  Social History Narrative   Fun/Hobby: Basketball, fish, bowling.     Current Outpatient Medications on File Prior to Visit  Medication Sig Dispense Refill  . cyclobenzaprine (FLEXERIL) 10 MG tablet Take 1 tablet (10 mg total) by mouth 3 (three) times daily as needed for muscle spasms. 20 tablet 0  . gabapentin (NEURONTIN) 100 MG capsule Take 1-2 capsules at bedtime for neuropathy 180 capsule 1  . glucose blood (CONTOUR TEST) test strip 1 each by Other route 4 (four) times daily. And  lancets 4/day 120 each 11  . Multiple Vitamin (MULTIVITAMIN WITH MINERALS) TABS tablet Take 1 tablet by mouth daily.     No current facility-administered medications on file prior to visit.     No Known Allergies  Family History  Problem Relation Age of Onset  . Diabetes Mellitus II Sister   . Diabetes Sister   . Diabetes Mellitus II Brother   . Diabetes Brother   . Diabetes Mellitus II Mother   . Diabetes Mother   . Diabetes Mellitus II Father   . Diabetes Father   . Heart disease Father     BP 126/80 (BP Location: Left Arm, Patient Position: Sitting, Cuff Size: Normal)   Pulse (!) 102   Ht _0  (1.727 m)   Wt 176 lb 6.4 oz (80 kg)   SpO2 95%   BMI 26.82 kg/m    Review of Systems He denies hypoglycemia.  Denies n/v/sob.  He has ED sxs.     Objective:   Physical Exam VITAL SIGNS:  See vs  page GENERAL: no distress Pulses: foot pulses are intact bilaterally.   MSK: no deformity of the feet or ankles.  CV: no edema of the legs or ankles.   Skin:  no ulcer on the feet or ankles, but the skin is dry.  normal color and temp on the feet and ankles Neuro: sensation is intact to touch on the feet and ankles.   Ext: There is bilateral onychomycosis of the toenails.    Lab Results  Component Value Date   HGBA1C 12.4 (A) 06/20/2018       Assessment & Plan:  Type 1 DM, with PN: worse.  Noncompliance with cbg recording and insulin. We'll change to tresiba, for dosing flexibility.  ED: new to me.    Patient Instructions  check your blood sugar 4 times a day: before the 3 meals, and at bedtime.  also check if you have symptoms of your blood sugar being too high or too low.  please keep a record of the readings and bring it to your next appointment here (or you can bring the meter itself).  You can write it on any piece of paper.  please call us sooner if your blood sugar goes below 70, or if you have a lot of readings over 200. Please change the basaglar to "Tresiba," 35 units each morning.  I have sent a prescription to your pharmacy.  With this insulin, if you miss it, you can make it up as soon as you remember.   On this type of insulin schedule, you should eat meals on a regular schedule.  If a meal is missed or significantly delayed, your blood sugar could go low.   Blood tests are requested for you today.  We'll let you know about the results.  I have sent a prescription to your pharmacy, for "Viagra." Please come back for a follow-up appointment in 2 months.

## 2018-06-20 NOTE — Patient Instructions (Addendum)
check your blood sugar 4 times a day: before the 3 meals, and at bedtime.  also check if you have symptoms of your blood sugar being too high or too low.  please keep a record of the readings and bring it to your next appointment here (or you can bring the meter itself).  You can write it on any piece of paper.  please call us sooner if your blood sugar goes below 70, or if you have a lot of readings over 200. Please change the basaglar to "Tresiba," 35 units each morning.  I have sent a prescription to your pharmacy.  With this insulin, if you miss it, you can make it up as soon as you remember.   On this type of insulin schedule, you should eat meals on a regular schedule.  If a meal is missed or significantly delayed, your blood sugar could go low.   Blood tests are requested for you today.  We'll let you know about the results.  I have sent a prescription to your pharmacy, for "Viagra." Please come back for a follow-up appointment in 2 months.

## 2018-08-19 ENCOUNTER — Telehealth: Payer: Self-pay | Admitting: Endocrinology

## 2018-08-19 ENCOUNTER — Other Ambulatory Visit: Payer: Self-pay

## 2018-08-19 ENCOUNTER — Ambulatory Visit: Payer: BLUE CROSS/BLUE SHIELD | Admitting: Endocrinology

## 2018-08-19 MED ORDER — INSULIN DEGLUDEC 100 UNIT/ML ~~LOC~~ SOPN: 35.0000 [IU] | PEN_INJECTOR | Freq: Every day | SUBCUTANEOUS | 11 refills | Status: DC

## 2018-08-19 NOTE — Telephone Encounter (Signed)
Rx sent 

## 2018-08-19 NOTE — Telephone Encounter (Signed)
MEDICATION: insulin degludec (TRESIBA FLEXTOUCH) 100 UNIT/ML SOPN FlexTouch Pen  PHARMACY:  [id number] - [place],  - [address]  IS THIS A 90 DAY SUPPLY : 30 day sluppy   IS PATIENT OUT OF MEDICATION: yes  IF NOT; HOW MUCH IS LEFT:   LAST APPOINTMENT DATE: @1 /23/2020  NEXT APPOINTMENT DATE:@4 /06/2018  DO WE HAVE YOUR PERMISSION TO LEAVE A DETAILED MESSAGE:  OTHER COMMENTS:    **Let patient know to contact pharmacy at the end of the day to make sure medication is ready. **  ** Please notify patient to allow 48-72 hours to process**  **Encourage patient to contact the pharmacy for refills or they can request refills through [facility]**

## 2018-08-22 ENCOUNTER — Other Ambulatory Visit: Payer: Self-pay | Admitting: Endocrinology

## 2018-08-22 NOTE — Telephone Encounter (Signed)
Patient is needing RX transferred to [place] off of [address].  Please Advise, Thanks

## 2018-08-23 ENCOUNTER — Other Ambulatory Visit: Payer: Self-pay

## 2018-08-23 ENCOUNTER — Telehealth: Payer: Self-pay | Admitting: Endocrinology

## 2018-08-23 ENCOUNTER — Other Ambulatory Visit: Payer: Self-pay | Admitting: Endocrinology

## 2018-08-23 MED ORDER — INSULIN DEGLUDEC 100 UNIT/ML ~~LOC~~ SOPN: 35.0000 [IU] | PEN_INJECTOR | Freq: Every day | SUBCUTANEOUS | 11 refills | Status: DC

## 2018-08-23 NOTE — Telephone Encounter (Signed)
Rx was sent there

## 2018-08-23 NOTE — Telephone Encounter (Signed)
Cannot find pharmacy pt is requesting, as there is not an exact address. Attempted to call the pt, and received no answer. And was unable to leave a voicemail.

## 2018-08-23 NOTE — Telephone Encounter (Signed)
Clarified with front office staff member that took this message, and she stated that the pt did reference [facility] on [address].

## 2018-08-23 NOTE — Telephone Encounter (Signed)
Patient ia requesting a call back to discuss a problem he is having getting his insulin. He stated a "letter" was going to be sent to our office

## 2018-08-26 ENCOUNTER — Telehealth: Payer: Self-pay

## 2018-08-26 NOTE — Telephone Encounter (Signed)
Joshua Escobar is not covered under patient insurance.  Please advise if you would like to attempt a PA or change medication.

## 2018-08-26 NOTE — Telephone Encounter (Signed)
OK, please change back to basaglar 35 units qam

## 2018-08-29 ENCOUNTER — Encounter: Payer: Self-pay | Admitting: Endocrinology

## 2018-08-29 ENCOUNTER — Telehealth: Payer: Self-pay | Admitting: Endocrinology

## 2018-08-29 ENCOUNTER — Ambulatory Visit (INDEPENDENT_AMBULATORY_CARE_PROVIDER_SITE_OTHER): Payer: BLUE CROSS/BLUE SHIELD | Admitting: Endocrinology

## 2018-08-29 ENCOUNTER — Other Ambulatory Visit: Payer: Self-pay

## 2018-08-29 VITALS — BP 118/80 | HR 102 | Temp 99.0°F | Ht 68.0 in | Wt 172.0 lb

## 2018-08-29 DIAGNOSIS — E1042 Type 1 diabetes mellitus with diabetic polyneuropathy: Secondary | ICD-10-CM

## 2018-08-29 DIAGNOSIS — IMO0001 Reserved for inherently not codable concepts without codable children: Secondary | ICD-10-CM

## 2018-08-29 DIAGNOSIS — Z794 Long term (current) use of insulin: Secondary | ICD-10-CM | POA: Diagnosis not present

## 2018-08-29 DIAGNOSIS — E1165 Type 2 diabetes mellitus with hyperglycemia: Principal | ICD-10-CM

## 2018-08-29 DIAGNOSIS — N529 Male erectile dysfunction, unspecified: Secondary | ICD-10-CM

## 2018-08-29 LAB — POCT GLYCOSYLATED HEMOGLOBIN (HGB A1C)
HbA1c POC (<> result, manual entry): 13.2 % (ref 4.0–5.6)
Hemoglobin A1C: 13.2 % — AB (ref 4.0–5.6)

## 2018-08-29 LAB — TSH: TSH: 2.01 u[IU]/mL (ref 0.35–4.50)

## 2018-08-29 MED ORDER — INSULIN GLARGINE 100 UNIT/ML SOLOSTAR PEN: 35.0000 [IU] | PEN_INJECTOR | SUBCUTANEOUS | 99 refills | Status: DC

## 2018-08-29 NOTE — Telephone Encounter (Signed)
Insulin Glargine (LANTUS SOLOSTAR) 100 UNIT/ML Solostar Pen   Patient stated that his medication as sent to the wrong pharmacy and needs this sent to the [facility] listed below as soon as we can     [id number] - [place], [place] - [address]

## 2018-08-29 NOTE — Patient Instructions (Addendum)
Please change the insulin to Lantus.  Here is a discount card. Blood tests are requested for you today.  We'll let you know about the results.  If the testosterone is normal, you should see a Urology specialist. check your blood sugar once a day.  vary the time of day when you check, between before the 3 meals, and at bedtime.  also check if you have symptoms of your blood sugar being too high or too low.  please keep a record of the readings and bring it to your next appointment here (or you can bring the meter itself).  You can write it on any piece of paper.  please call us sooner if your blood sugar goes below 70, or if you have a lot of readings over 200. Please come back for a follow-up appointment in 2 months.

## 2018-08-29 NOTE — Telephone Encounter (Signed)
[facility] and [facility] sent in to requested pharmacy

## 2018-08-29 NOTE — Telephone Encounter (Signed)
Pt was seen in the office today and sent in new rx covered by insurance.

## 2018-08-29 NOTE — Progress Notes (Signed)
Subjective:    Patient ID: Joshua Escobar, male    DOB: April 15, 1971, 48 y.o.   MRN: [id number]  HPI Pt returns for f/u of diabetes mellitus:  DM type: 1 Dx'ed: 5573 Complications: polyneuropathy Therapy: insulin since dx.   DKA: Last episode was in early 2019 Severe hypoglycemia: never Pancreatitis: never Pancreatic imaging: normal on 2018 CT Other: He works 1st shift, [employer]; A1c has been verified by fructosamine; he takes QD insulin, after poor results with multiple daily injections.  Interval history: Pt says he has been unable to refill insulin x the past 2 weeks, due to insurance.  pt states he feels well in general. viagra does not work. Past Medical History:  Diagnosis Date  . Migraine headache   . Type II diabetes mellitus ([place])     Past Surgical History:  Procedure Laterality Date  . LEFT HEART CATH AND CORONARY ANGIOGRAPHY N/A 11/27/2016   Procedure: Left Heart Cath and Coronary Angiography;  Surgeon: Lorretta Harp, MD;  Location: [facility];  Service: Cardiovascular;  Laterality: N/A;    Social History   Socioeconomic History  . Marital status: Single    Spouse name: Not on file  . Number of children: 2  . Years of education: 80  . Highest education level: Not on file  Occupational History  . Occupation: Engineer, civil (consulting)  . Occupation: BUILF ATM MACHINES    Employer: [employer]  Social Needs  . Financial resource strain: Not hard at all  . Food insecurity:    Worry: Never true    Inability: Never true  . Transportation needs:    Medical: No    Non-medical: No  Tobacco Use  . Smoking status: Current Every Day Smoker    Packs/day: 0.50    Years: 20.00    Pack years: 10.00    Types: Cigarettes  . Smokeless tobacco: Never Used  Substance and Sexual Activity  . Alcohol use: Yes    Alcohol/week: 6.0 standard drinks    Types: 6 Cans of beer per week    Comment: weekends only  . Drug use: No  . Sexual activity: Not Currently   Lifestyle  . Physical activity:    Days per week: 5 days    Minutes per session: 30 min  . Stress: Not at all  Relationships  . Social connections:    Talks on phone: Not on file    Gets together: Not on file    Attends religious service: Not on file    Active member of club or organization: Not on file    Attends meetings of clubs or organizations: Not on file    Relationship status: Not on file  . Intimate partner violence:    Fear of current or ex partner: Not on file    Emotionally abused: Not on file    Physically abused: Not on file    Forced sexual activity: Not on file  Other Topics Concern  . Not on file  Social History Narrative   Fun/Hobby: Basketball, fish, bowling.     Current Outpatient Medications on File Prior to Visit  Medication Sig Dispense Refill  . cyclobenzaprine (FLEXERIL) 10 MG tablet Take 1 tablet (10 mg total) by mouth 3 (three) times daily as needed for muscle spasms. 20 tablet 0  . gabapentin (NEURONTIN) 100 MG capsule Take 1-2 capsules at bedtime for neuropathy 180 capsule 1  . glucose blood (CONTOUR TEST) test strip 1 each by Other route 4 (  four) times daily. And lancets 4/day 120 each 11  . Multiple Vitamin (MULTIVITAMIN WITH MINERALS) TABS tablet Take 1 tablet by mouth daily.    . sildenafil (VIAGRA) 100 MG tablet Take 1 tablet (100 mg total) by mouth daily as needed for erectile dysfunction. 10 tablet 11   No current facility-administered medications on file prior to visit.     No Known Allergies  Family History  Problem Relation Age of Onset  . Diabetes Mellitus II Sister   . Diabetes Sister   . Diabetes Mellitus II Brother   . Diabetes Brother   . Diabetes Mellitus II Mother   . Diabetes Mother   . Diabetes Mellitus II Father   . Diabetes Father   . Heart disease Father     BP 118/80   Pulse (!) 102   Temp 99 F (37.2 C) (Oral)   Ht '5\' 8"'  (1.727 m)   Wt 172 lb (78 kg)   SpO2 95%   BMI 26.15 kg/m   Review of Systems  Denies n/v/sob.     Objective:   Physical Exam VITAL SIGNS:  See vs page GENERAL: no distress Pulses: dorsalis pedis intact bilat.   MSK: no deformity of the feet CV: no leg edema Skin:  no ulcer on the feet.  normal color and temp on the feet. Neuro: sensation is intact to touch on the feet  Lab Results  Component Value Date   HGBA1C 13.2 (A) 08/29/2018   HGBA1C 13.2 08/29/2018   Lab Results  Component Value Date   TESTOSTERONE 585 08/30/2018      Assessment & Plan:  Type 1 DM, with PN: worse Noncompliance with cbg recording and insulin.  Office staff calls insurance, and determined best coverage is Lantus.   ED: not explained by testosterone level.  Ref urol.  Patient Instructions  Please change the insulin to Lantus.  Here is a discount card. Blood tests are requested for you today.  We'll let you know about the results.  If the testosterone is normal, you should see a Urology specialist. check your blood sugar once a day.  vary the time of day when you check, between before the 3 meals, and at bedtime.  also check if you have symptoms of your blood sugar being too high or too low.  please keep a record of the readings and bring it to your next appointment here (or you can bring the meter itself).  You can write it on any piece of paper.  please call us sooner if your blood sugar goes below 70, or if you have a lot of readings over 200. Please come back for a follow-up appointment in 2 months.

## 2018-08-31 LAB — TESTOSTERONE,FREE AND TOTAL
Testosterone, Free: 12.2 pg/mL (ref 6.8–21.5)
Testosterone: 585 ng/dL (ref 264–916)

## 2018-09-02 ENCOUNTER — Other Ambulatory Visit: Payer: Self-pay

## 2018-09-03 ENCOUNTER — Telehealth: Payer: Self-pay | Admitting: Endocrinology

## 2018-09-03 NOTE — Telephone Encounter (Signed)
Angel with cover my meds is calling to verify if we have received the fax regarding a PA for the patients Insulin and would like to know if we need any more assistance for this   Insulin Glargine (LANTUS SOLOSTAR) 100 UNIT/ML Solostar Pen    PHONE- [phone] REF # [id number]

## 2018-09-22 ENCOUNTER — Encounter: Payer: Self-pay | Admitting: Endocrinology

## 2018-10-25 ENCOUNTER — Other Ambulatory Visit: Payer: Self-pay

## 2018-10-29 ENCOUNTER — Ambulatory Visit: Payer: BLUE CROSS/BLUE SHIELD | Admitting: Endocrinology

## 2018-12-18 ENCOUNTER — Telehealth: Payer: Self-pay | Admitting: Endocrinology

## 2018-12-18 NOTE — Telephone Encounter (Signed)
LMTCB and schedule appointment that he sent [place] a message about

## 2019-01-21 ENCOUNTER — Encounter (HOSPITAL_COMMUNITY): Payer: Self-pay | Admitting: Emergency Medicine

## 2019-01-21 ENCOUNTER — Ambulatory Visit (HOSPITAL_COMMUNITY)
Admission: EM | Admit: 2019-01-21 | Discharge: 2019-01-21 | Disposition: A | Discharge status: Home / Self Care | Payer: BC Managed Care – PPO | Attending: Emergency Medicine | Admitting: Emergency Medicine

## 2019-01-21 ENCOUNTER — Other Ambulatory Visit: Payer: Self-pay

## 2019-01-21 DIAGNOSIS — H00011 Hordeolum externum right upper eyelid: Secondary | ICD-10-CM

## 2019-01-21 MED ORDER — POLYMYXIN B-TRIMETHOPRIM 10000-0.1 UNIT/ML-% OP SOLN: 1.0000 [drp] | Freq: Four times a day (QID) | OPHTHALMIC | 0 refills | Status: DC

## 2019-01-21 NOTE — ED Triage Notes (Signed)
Pt c/o R upper eyelid pain and swelling since Friday. Denies vision changes.

## 2019-01-21 NOTE — Discharge Instructions (Addendum)
Use the antibiotic eyedrops as prescribed.   Apply warm compresses to your eye several times a day.    Follow-up with your eye doctor or the eye doctor listed below.  Call today to schedule an appointment.    Return here or go to the emergency department if you develop eye pain or changes in your vision.

## 2019-01-21 NOTE — ED Provider Notes (Signed)
[facility]    CSN: [id number] Arrival date & time: 01/21/19  1006      History   Chief Complaint Chief Complaint  Patient presents with  . Eye Problem    HPI Joshua Escobar is a 48 y.o. male.   Patient presents with swelling of his right upper eyelid x5 days.  He states he woke up with the swelling on the first day and it has persisted since.  No treatments attempted at home.  He denies eye pain or changes in his vision.  He reports scant amount of drainage from his eye which is clear white.  He denies fever, chills, or other symptoms.  The history is provided by the patient.    Past Medical History:  Diagnosis Date  . Migraine headache   . Type II diabetes mellitus [facility](HCC)     Patient Active Problem List   Diagnosis Date Noted  . Erectile dysfunction 06/20/2018  . Tobacco use disorder 08/28/2017  . DM type 2 with diabetic peripheral neuropathy (HCC) 05/16/2017  . DKA (diabetic ketoacidoses) (HCC) 05/15/2017  . Back pain 12/28/2016  . Diabetic neuropathy (HCC) 12/28/2016  . HLD (hyperlipidemia) 10/03/2016    Past Surgical History:  Procedure Laterality Date  . LEFT HEART CATH AND CORONARY ANGIOGRAPHY N/A 11/27/2016   Procedure: Left Heart Cath and Coronary Angiography;  Surgeon: Runell GessBerry, Jonathan J, MD;  Location: [facility];  Service: Cardiovascular;  Laterality: N/A;       Home Medications    Prior to Admission medications   Medication Sig Start Date End Date Taking? Authorizing Provider  cyclobenzaprine (FLEXERIL) 10 MG tablet Take 1 tablet (10 mg total) by mouth 3 (three) times daily as needed for muscle spasms. 08/28/17   Salley Scarleturham, Kawanta F, MD  gabapentin (NEURONTIN) 100 MG capsule Take 1-2 capsules at bedtime for neuropathy 09/20/17   Salley Scarleturham, Kawanta F, MD  glucose blood (CONTOUR TEST) test strip 1 each by Other route 4 (four) times daily. And lancets 4/day 11/01/17   Romero BellingEllison, Sean, MD  Insulin Glargine (LANTUS SOLOSTAR) 100 UNIT/ML Solostar Pen  Inject 35 Units into the skin every morning. And pen needles 1/day 08/29/18   Romero BellingEllison, Sean, MD  Multiple Vitamin (MULTIVITAMIN WITH MINERALS) TABS tablet Take 1 tablet by mouth daily.    [provider]  sildenafil (VIAGRA) 100 MG tablet Take 1 tablet (100 mg total) by mouth daily as needed for erectile dysfunction. 06/20/18   Romero BellingEllison, Sean, MD  trimethoprim-polymyxin b (POLYTRIM) ophthalmic solution Place 1 drop into both eyes 4 (four) times daily for 7 days. 01/21/19 01/28/19  Mickie Bailate, Kelly H, NP    Family History Family History  Problem Relation Age of Onset  . Diabetes Mellitus II Sister   . Diabetes Sister   . Diabetes Mellitus II Brother   . Diabetes Brother   . Diabetes Mellitus II Mother   . Diabetes Mother   . Diabetes Mellitus II Father   . Diabetes Father   . Heart disease Father     Social History Social History   Tobacco Use  . Smoking status: Current Every Day Smoker    Packs/day: 0.50    Years: 20.00    Pack years: 10.00    Types: Cigarettes  . Smokeless tobacco: Never Used  Substance Use Topics  . Alcohol use: Yes    Alcohol/week: 6.0 standard drinks    Types: 6 Cans of beer per week    Comment: weekends only  . Drug use:  No     Allergies   Patient has no known allergies.   Review of Systems Review of Systems  Constitutional: Negative for chills and fever.  HENT: Negative for ear pain and sore throat.   Eyes: Negative for photophobia, pain, redness, itching and visual disturbance.  Respiratory: Negative for cough and shortness of breath.   Cardiovascular: Negative for chest pain and palpitations.  Gastrointestinal: Negative for abdominal pain and vomiting.  Genitourinary: Negative for dysuria and hematuria.  Musculoskeletal: Negative for arthralgias and back pain.  Skin: Negative for color change and rash.  Neurological: Negative for seizures and syncope.  All other systems reviewed and are negative.    Physical Exam Triage Vital Signs ED  Triage Vitals  Enc Vitals Group     BP 01/21/19 1032 (!) 136/98     Pulse Rate 01/21/19 1032 100     Resp 01/21/19 1032 14     Temp 01/21/19 1032 97.9 F (36.6 C)     Temp src --      SpO2 01/21/19 1032 99 %     Weight --      Height --      Head Circumference --      Peak Flow --      Pain Score 01/21/19 1033 8     Pain Loc --      Pain Edu? --      Excl. in GC? --    No data found.  Updated Vital Signs BP (!) 136/98   Pulse 100   Temp 97.9 F (36.6 C)   Resp 14   SpO2 99%   Visual Acuity Right Eye Distance:   Left Eye Distance:   Bilateral Distance:    Right Eye Near:   Left Eye Near:    Bilateral Near:     Physical Exam Vitals signs and nursing note reviewed.  Constitutional:      Appearance: He is well-developed.  HENT:     Head: Normocephalic and atraumatic.     Right Ear: Tympanic membrane normal.     Left Ear: Tympanic membrane normal.     Nose: Nose normal.     Mouth/Throat:     Mouth: Mucous membranes are moist.     Pharynx: Oropharynx is clear.  Eyes:     General: Vision grossly intact.        Right eye: Hordeolum present.     Extraocular Movements: Extraocular movements intact.     Conjunctiva/sclera: Conjunctivae normal.     Right eye: Right conjunctiva is not injected.     Left eye: Left conjunctiva is not injected.      Comments: Right upper eyelid edematous.  Hordeolum noted (see diagram)  Neck:     Musculoskeletal: Neck supple.  Cardiovascular:     Rate and Rhythm: Normal rate and regular rhythm.     Heart sounds: No murmur.  Pulmonary:     Effort: Pulmonary effort is normal. No respiratory distress.     Breath sounds: Normal breath sounds.  Abdominal:     Palpations: Abdomen is soft.     Tenderness: There is no abdominal tenderness.  Skin:    General: Skin is warm and dry.  Neurological:     Mental Status: He is alert.      UC Treatments / Results  Labs (all labs ordered are listed, but only abnormal results are  displayed) Labs Reviewed - No data to display  EKG   Radiology No results found.  Procedures  Procedures (including critical care time)  Medications Ordered in UC Medications - No data to display  Initial Impression / Assessment and Plan / UC Course  I have reviewed the triage vital signs and the nursing notes.  Pertinent labs & imaging results that were available during my care of the patient were reviewed by me and considered in my medical decision making (see chart for details).    Right upper eyelid hordeolum.  Treating with warm compresses and Polytrim eyedrops.  Instructed patient to follow-up with his eye doctor or the suggested eye doctor if his swelling is not improved in the next 1 to 2 days.  Discussed that he should go to the emergency department if he develops eye pain or vision change.     Final Clinical Impressions(s) / UC Diagnoses   Final diagnoses:  Hordeolum externum of right upper eyelid     Discharge Instructions     Use the antibiotic eyedrops as prescribed.   Apply warm compresses to your eye several times a day.    Follow-up with your eye doctor or the eye doctor listed below.  Call today to schedule an appointment.    Return here or go to the emergency department if you develop eye pain or changes in your vision.       ED Prescriptions    Medication Sig Dispense Auth. Provider   trimethoprim-polymyxin b (POLYTRIM) ophthalmic solution Place 1 drop into both eyes 4 (four) times daily for 7 days. 10 mL Sharion Balloon, NP     Controlled Substance Prescriptions [place] Controlled Substance Registry consulted? Not Applicable   Sharion Balloon, NP 01/21/19 1100

## 2019-01-24 ENCOUNTER — Telehealth: Payer: Self-pay

## 2019-01-24 ENCOUNTER — Encounter: Payer: Self-pay | Admitting: Endocrinology

## 2019-01-24 ENCOUNTER — Ambulatory Visit (INDEPENDENT_AMBULATORY_CARE_PROVIDER_SITE_OTHER): Payer: BC Managed Care – PPO | Admitting: Endocrinology

## 2019-01-24 ENCOUNTER — Other Ambulatory Visit: Payer: Self-pay

## 2019-01-24 VITALS — BP 104/74 | HR 95 | Ht 68.0 in | Wt 174.8 lb

## 2019-01-24 DIAGNOSIS — E1042 Type 1 diabetes mellitus with diabetic polyneuropathy: Secondary | ICD-10-CM | POA: Diagnosis not present

## 2019-01-24 DIAGNOSIS — IMO0001 Reserved for inherently not codable concepts without codable children: Secondary | ICD-10-CM

## 2019-01-24 DIAGNOSIS — Z794 Long term (current) use of insulin: Secondary | ICD-10-CM

## 2019-01-24 DIAGNOSIS — E1165 Type 2 diabetes mellitus with hyperglycemia: Secondary | ICD-10-CM | POA: Diagnosis not present

## 2019-01-24 LAB — POCT GLYCOSYLATED HEMOGLOBIN (HGB A1C): Hemoglobin A1C: 13.6 % — AB (ref 4.0–5.6)

## 2019-01-24 MED ORDER — TRESIBA FLEXTOUCH 100 UNIT/ML ~~LOC~~ SOPN: 35.0000 [IU] | PEN_INJECTOR | Freq: Every day | SUBCUTANEOUS | 11 refills | Status: DC

## 2019-01-24 NOTE — Telephone Encounter (Signed)
PA initiated today through Cover My Meds for [place]. Will await insurance response re: approval/denial.  Minette Brine Key: [id number] - PA Case ID: [id number] - Rx #: [id number] Need help? Call us at [phone] Status Sent to [facility] Aas FlexTouch (insulin degludec injection) 100 Units/mL solution Form [employer] Electronic Prior Authorization Form (2017 NCPDP) Original Claim Info 84

## 2019-01-24 NOTE — Progress Notes (Signed)
Subjective:    Patient ID: Joshua Escobar, male    DOB: 24-Feb-1971, 48 y.o.   MRN: [id number]  HPI Pt returns for f/u of diabetes mellitus:  DM type: 1 Dx'ed: 1505 Complications: polyneuropathy Therapy: insulin since dx.   DKA: Last episode was in early 2019 Severe hypoglycemia: never Pancreatitis: never Pancreatic imaging: normal on 2018 CT Other: He works 2nd shift, distrubution center; A1c has been verified by fructosamine; he takes QD insulin, after poor results with multiple daily injections.   Interval history:  pt states he feels well in general.  Pt says 1 pen last 6 days.  Denies injection problems.  Pt says he does not miss the insulin, but he hasn't taken yet today.   Past Medical History:  Diagnosis Date  . Migraine headache   . Type II diabetes mellitus ([place])     Past Surgical History:  Procedure Laterality Date  . LEFT HEART CATH AND CORONARY ANGIOGRAPHY N/A 11/27/2016   Procedure: Left Heart Cath and Coronary Angiography;  Surgeon: Lorretta Harp, MD;  Location: [facility];  Service: Cardiovascular;  Laterality: N/A;    Social History   Socioeconomic History  . Marital status: Single    Spouse name: Not on file  . Number of children: 2  . Years of education: 7  . Highest education level: Not on file  Occupational History  . Occupation: Engineer, civil (consulting)  . Occupation: BUILF ATM MACHINES    Employer: [employer]  Social Needs  . Financial resource strain: Not hard at all  . Food insecurity    Worry: Never true    Inability: Never true  . Transportation needs    Medical: No    Non-medical: No  Tobacco Use  . Smoking status: Current Every Day Smoker    Packs/day: 0.50    Years: 20.00    Pack years: 10.00    Types: Cigarettes  . Smokeless tobacco: Never Used  Substance and Sexual Activity  . Alcohol use: Yes    Alcohol/week: 6.0 standard drinks    Types: 6 Cans of beer per week    Comment: weekends only  . Drug use: No  . Sexual  activity: Not Currently  Lifestyle  . Physical activity    Days per week: 5 days    Minutes per session: 30 min  . Stress: Not at all  Relationships  . Social Herbalist on phone: Not on file    Gets together: Not on file    Attends religious service: Not on file    Active member of club or organization: Not on file    Attends meetings of clubs or organizations: Not on file    Relationship status: Not on file  . Intimate partner violence    Fear of current or ex partner: Not on file    Emotionally abused: Not on file    Physically abused: Not on file    Forced sexual activity: Not on file  Other Topics Concern  . Not on file  Social History Narrative   Fun/Hobby: Basketball, fish, bowling.     Current Outpatient Medications on File Prior to Visit  Medication Sig Dispense Refill  . glucose blood (CONTOUR TEST) test strip 1 each by Other route 4 (four) times daily. And lancets 4/day (Patient taking differently: 1 each by Other route 4 (four) times daily. ) 120 each 11   No current facility-administered medications on file prior to visit.  No Known Allergies  Family History  Problem Relation Age of Onset  . Diabetes Mellitus II Sister   . Diabetes Sister   . Diabetes Mellitus II Brother   . Diabetes Brother   . Diabetes Mellitus II Mother   . Diabetes Mother   . Diabetes Mellitus II Father   . Diabetes Father   . Heart disease Father     BP 104/74 (BP Location: Left Arm, Patient Position: Sitting, Cuff Size: Large)   Pulse 95   Ht _0  (1.727 m)   Wt 174 lb 12.8 oz (79.3 kg)   SpO2 94%   BMI 26.58 kg/m    Review of Systems No weight change    Objective:   Physical Exam VITAL SIGNS:  See vs page GENERAL: no distress Pulses: dorsalis pedis intact bilat.   MSK: no deformity of the feet CV: no leg edema Skin:  no ulcer on the feet, but the skin is dry.  normal color and temp on the feet. Neuro: sensation is intact to touch on the feet Ext:  there is bilateral onychomycosis of the toenails.   Lab Results  Component Value Date   HGBA1C 13.6 (A) 01/24/2019       Assessment & Plan:  type 1 DM, with PN: worse. Noncompliance, as evidenced by this a1c.  Pt declines ref to CDE, to review injections.    Patient Instructions  Please change the insulin to [place].  I have sent a prescription to your pharmacy.  With this insulin, if you do miss it, you can make it up later.     check your blood sugar once a day.  vary the time of day when you check, between before the 3 meals, and at bedtime.  also check if you have symptoms of your blood sugar being too high or too low.  please keep a record of the readings and bring it to your next appointment here (or you can bring the meter itself).  You can write it on any piece of paper.  please call us sooner if your blood sugar goes below 70, or if you have a lot of readings over 200. Please come back for a follow-up appointment in 2 months.

## 2019-01-24 NOTE — Patient Instructions (Addendum)
Please change the insulin to [place].  I have sent a prescription to your pharmacy.  With this insulin, if you do miss it, you can make it up later.     check your blood sugar once a day.  vary the time of day when you check, between before the 3 meals, and at bedtime.  also check if you have symptoms of your blood sugar being too high or too low.  please keep a record of the readings and bring it to your next appointment here (or you can bring the meter itself).  You can write it on any piece of paper.  please call us sooner if your blood sugar goes below 70, or if you have a lot of readings over 200. Please come back for a follow-up appointment in 2 months.

## 2019-01-25 DIAGNOSIS — E1165 Type 2 diabetes mellitus with hyperglycemia: Secondary | ICD-10-CM | POA: Insufficient documentation

## 2019-01-25 DIAGNOSIS — E119 Type 2 diabetes mellitus without complications: Secondary | ICD-10-CM | POA: Insufficient documentation

## 2019-01-27 ENCOUNTER — Telehealth: Payer: Self-pay

## 2019-01-27 NOTE — Telephone Encounter (Signed)
Prior authorization for Joshua Escobar has been approved by patient's insurance.  Coverage is effective until 01/24/2020  PA Ref# [id number]  Approval letter has been sent to scanning.

## 2019-02-20 ENCOUNTER — Other Ambulatory Visit: Payer: Self-pay

## 2019-02-20 ENCOUNTER — Encounter (HOSPITAL_COMMUNITY): Payer: Self-pay

## 2019-02-20 ENCOUNTER — Emergency Department (HOSPITAL_COMMUNITY)
Admission: EM | Admit: 2019-02-20 | Discharge: 2019-02-20 | Disposition: A | Discharge status: Home / Self Care | Payer: BC Managed Care – PPO | Attending: Emergency Medicine | Admitting: Emergency Medicine

## 2019-02-20 DIAGNOSIS — F17210 Nicotine dependence, cigarettes, uncomplicated: Secondary | ICD-10-CM | POA: Insufficient documentation

## 2019-02-20 DIAGNOSIS — Z794 Long term (current) use of insulin: Secondary | ICD-10-CM | POA: Diagnosis not present

## 2019-02-20 DIAGNOSIS — L0201 Cutaneous abscess of face: Secondary | ICD-10-CM | POA: Insufficient documentation

## 2019-02-20 DIAGNOSIS — E119 Type 2 diabetes mellitus without complications: Secondary | ICD-10-CM | POA: Insufficient documentation

## 2019-02-20 LAB — CBC WITH DIFFERENTIAL/PLATELET
Abs Immature Granulocytes: 0.02 10*3/uL (ref 0.00–0.07)
Basophils Absolute: 0 10*3/uL (ref 0.0–0.1)
Basophils Relative: 1 %
Eosinophils Absolute: 0.1 10*3/uL (ref 0.0–0.5)
Eosinophils Relative: 2 %
HCT: 45.8 % (ref 39.0–52.0)
Hemoglobin: 14.6 g/dL (ref 13.0–17.0)
Immature Granulocytes: 0 %
Lymphocytes Relative: 33 %
Lymphs Abs: 2.4 10*3/uL (ref 0.7–4.0)
MCH: 26.6 pg (ref 26.0–34.0)
MCHC: 31.9 g/dL (ref 30.0–36.0)
MCV: 83.4 fL (ref 80.0–100.0)
Monocytes Absolute: 0.7 10*3/uL (ref 0.1–1.0)
Monocytes Relative: 10 %
Neutro Abs: 4.1 10*3/uL (ref 1.7–7.7)
Neutrophils Relative %: 54 %
Platelets: 197 10*3/uL (ref 150–400)
RBC: 5.49 MIL/uL (ref 4.22–5.81)
RDW: 13.5 % (ref 11.5–15.5)
WBC: 7.5 10*3/uL (ref 4.0–10.5)
nRBC: 0 % (ref 0.0–0.2)

## 2019-02-20 LAB — BASIC METABOLIC PANEL
Anion gap: 10 (ref 5–15)
BUN: 10 mg/dL (ref 6–20)
CO2: 24 mmol/L (ref 22–32)
Calcium: 8.8 mg/dL — ABNORMAL LOW (ref 8.9–10.3)
Chloride: 101 mmol/L (ref 98–111)
Creatinine, Ser: 0.78 mg/dL (ref 0.61–1.24)
GFR calc Af Amer: >60 mL/min (ref >60)
GFR calc non Af Amer: >60 mL/min (ref >60)
Glucose, Bld: 361 mg/dL — ABNORMAL HIGH (ref 70–99)
Potassium: 3.9 mmol/L (ref 3.5–5.1)
Sodium: 135 mmol/L (ref 135–145)

## 2019-02-20 LAB — CBG MONITORING, ED
Glucose-Capillary: 254 mg/dL — ABNORMAL HIGH (ref 70–99)
Glucose-Capillary: 332 mg/dL — ABNORMAL HIGH (ref 70–99)

## 2019-02-20 MED ORDER — HYDROCODONE-ACETAMINOPHEN 5-325 MG PO TABS
2.0000 | ORAL_TABLET | Freq: Once | ORAL | Status: AC
  Administered 2019-02-20: 2 via ORAL
  Filled 2019-02-20: qty 2

## 2019-02-20 MED ORDER — CLINDAMYCIN HCL 150 MG PO CAPS
450.0000 mg | ORAL_CAPSULE | Freq: Once | ORAL | Status: AC
  Administered 2019-02-20: 450 mg via ORAL
  Filled 2019-02-20: qty 3

## 2019-02-20 MED ORDER — LIDOCAINE HCL (PF) 1 % IJ SOLN
5.0000 mL | Freq: Once | INTRAMUSCULAR | Status: AC
  Administered 2019-02-20: 5 mL
  Filled 2019-02-20: qty 5

## 2019-02-20 MED ORDER — CLINDAMYCIN HCL 150 MG PO CAPS: 450.0000 mg | ORAL_CAPSULE | Freq: Three times a day (TID) | ORAL | 0 refills | Status: AC

## 2019-02-20 NOTE — ED Provider Notes (Signed)
[facility] EMERGENCY DEPARTMENT Provider Note   CSN: [id number] Arrival date & time: 02/20/19  9937     History   Chief Complaint Chief Complaint  Patient presents with  . Abscess  . Facial Swelling    HPI Joshua Escobar is a 48 y.o. male history of diabetes on insulin, migraines presents today with abscess of the right shin.  Patient reports that last week he noticed an ingrown hair on his chin, and he shaved his beard off 2 days ago and shortly after noticed increased pain and swelling of the right chin.  He reports that he pushed on the area yesterday and had purulent drainage.  He describes a mild throbbing sensation constant worsened with palpation and without alleviating factors, denies radiation of pain.  Of note patient reports taking insulin regularly and monitoring his blood sugars daily.  Denies fever/chills, headache/vision changes, difficulty swallowing, swelling of the throat, nausea/vomiting, abdominal pain, chest pain/shortness of breath, diarrhea, neck pain/stiffness or any additional concerns.  Patient seen in conjunction with [employer] PA student Clifton Custard.    HPI  Past Medical History:  Diagnosis Date  . Migraine headache   . Type II diabetes mellitus [facility])     Patient Active Problem List   Diagnosis Date Noted  . Diabetes (HCC) 01/25/2019  . Erectile dysfunction 06/20/2018  . Tobacco use disorder 08/28/2017  . DKA (diabetic ketoacidoses) (HCC) 05/15/2017  . Back pain 12/28/2016  . Diabetic neuropathy (HCC) 12/28/2016  . HLD (hyperlipidemia) 10/03/2016    Past Surgical History:  Procedure Laterality Date  . LEFT HEART CATH AND CORONARY ANGIOGRAPHY N/A 11/27/2016   Procedure: Left Heart Cath and Coronary Angiography;  Surgeon: Runell Gess, MD;  Location: [facility] INVASIVE CV LAB;  Service: Cardiovascular;  Laterality: N/A;        Home Medications    Prior to Admission medications   Medication Sig Start Date End Date Taking?  Authorizing Provider  clindamycin (CLEOCIN) 150 MG capsule Take 3 capsules (450 mg total) by mouth 3 (three) times daily for 7 days. 02/20/19 02/27/19  Harlene Salts A, PA-C  glucose blood (CONTOUR TEST) test strip 1 each by Other route 4 (four) times daily. And lancets 4/day Patient taking differently: 1 each by Other route 4 (four) times daily.  11/01/17   Romero Belling, MD  insulin degludec (TRESIBA FLEXTOUCH) 100 UNIT/ML SOPN FlexTouch Pen Inject 0.35 mLs (35 Units total) into the skin daily. 01/24/19   Romero Belling, MD    Family History Family History  Problem Relation Age of Onset  . Diabetes Mellitus II Sister   . Diabetes Sister   . Diabetes Mellitus II Brother   . Diabetes Brother   . Diabetes Mellitus II Mother   . Diabetes Mother   . Diabetes Mellitus II Father   . Diabetes Father   . Heart disease Father     Social History Social History   Tobacco Use  . Smoking status: Current Every Day Smoker    Packs/day: 0.50    Years: 20.00    Pack years: 10.00    Types: Cigarettes  . Smokeless tobacco: Never Used  Substance Use Topics  . Alcohol use: Yes    Alcohol/week: 6.0 standard drinks    Types: 6 Cans of beer per week    Comment: weekends only  . Drug use: No     Allergies   Patient has no known allergies.   Review of Systems Review of Systems Ten systems are reviewed  and are negative for acute change except as noted in the HPI   Physical Exam Updated Vital Signs BP 124/84   Pulse 76   Temp 98.3 F (36.8 C) (Oral)   Resp 16   Ht 5\' 8"  (1.727 m)   Wt 78.5 kg   SpO2 99%   BMI 26.30 kg/m   Physical Exam Constitutional:      General: He is not in acute distress.    Appearance: Normal appearance. He is well-developed. He is not ill-appearing or diaphoretic.  HENT:     Head: Normocephalic and atraumatic.     Jaw: There is normal jaw occlusion. No trismus.      Comments: 1.5 cm diameter abscess of the right chin.  Does not extend beneath the  mandible.    Right Ear: External ear normal.     Left Ear: External ear normal.     Nose: Nose normal.     Mouth/Throat:     Comments: The patient has normal phonation and is in control of secretions. No stridor.  Midline uvula without edema. Soft palate rises symmetrically.No tonsillar erythema, swelling or exudates. Tongue protrusion is normal, floor of mouth is soft. No trismus. No creptius on neck palpation. No gingival erythema or fluctuance noted. Mucus membranes moist. Eyes:     General: Vision grossly intact. Gaze aligned appropriately.     Pupils: Pupils are equal, round, and reactive to light.  Neck:     Musculoskeletal: Normal range of motion.     Trachea: Trachea and phonation normal. No tracheal deviation.  Pulmonary:     Effort: Pulmonary effort is normal. No respiratory distress.  Abdominal:     General: There is no distension.     Palpations: Abdomen is soft.     Tenderness: There is no abdominal tenderness. There is no guarding or rebound.  Musculoskeletal: Normal range of motion.  Skin:    General: Skin is warm and dry.  Neurological:     Mental Status: He is alert.     GCS: GCS eye subscore is 4. GCS verbal subscore is 5. GCS motor subscore is 6.     Comments: Speech is clear and goal oriented, follows commands Major Cranial nerves without deficit, no facial droop Moves extremities without ataxia, coordination intact  Psychiatric:        Behavior: Behavior normal.    ED Treatments / Results  Labs (all labs ordered are listed, but only abnormal results are displayed) Labs Reviewed  BASIC METABOLIC PANEL - Abnormal; Notable for the following components:      Result Value   Glucose, Bld 361 (*)    Calcium 8.8 (*)    All other components within normal limits  CBG MONITORING, ED - Abnormal; Notable for the following components:   Glucose-Capillary 254 (*)    All other components within normal limits  CBG MONITORING, ED - Abnormal; Notable for the following  components:   Glucose-Capillary 332 (*)    All other components within normal limits  AEROBIC CULTURE (SUPERFICIAL SPECIMEN)  CBC WITH DIFFERENTIAL/PLATELET    EKG None  Radiology No results found.  Procedures .Marland Kitchen.Incision and Drainage  Date/Time: 02/20/2019 5:52 PM Performed by: Bill SalinasMorelli,  A, PA-C Authorized by: Bill SalinasMorelli,  A, PA-C   Consent:    Consent obtained:  Verbal   Consent given by:  Patient   Risks discussed:  Damage to other organs, bleeding, incomplete drainage, infection and pain Location:    Type:  Abscess   Size:  1.5   Location:  Head   Head location:  Face Pre-procedure details:    Skin preparation:  Betadine Anesthesia (see MAR for exact dosages):    Anesthesia method:  Local infiltration   Local anesthetic:  Lidocaine 1% w/o epi Procedure type:    Complexity:  Simple Procedure details:    Incision types:  Single straight   Scalpel blade:  11   Wound management:  Probed and deloculated   Drainage:  Purulent and bloody   Drainage amount:  Scant   Wound treatment:  Wound left open   Packing materials:  None Post-procedure details:    Patient tolerance of procedure:  Tolerated well, no immediate complications Comments:     Assisted by PA student.   (including critical care time)  Medications Ordered in ED Medications  HYDROcodone-acetaminophen (NORCO/VICODIN) 5-325 MG per tablet 2 tablet (has no administration in time range)  lidocaine (PF) (XYLOCAINE) 1 % injection 5 mL (5 mLs Infiltration Given by Other 02/20/19 1619)  clindamycin (CLEOCIN) capsule 450 mg (450 mg Oral Given 02/20/19 1618)     Initial Impression / Assessment and Plan / ED Course  I have reviewed the triage vital signs and the nursing notes.  Pertinent labs & imaging results that were available during my care of the patient were reviewed by me and considered in my medical decision making (see chart for details).     48 year old male with single skin abscess of the  right shin amenable to incision and drainage.  Abscess not large enough to warrant packing or drain.  Scant amount of purulent/bloody fluid obtained and sent for culture.  Patient is to follow-up with PCP in 48 hours for recheck.  CBC within normal limits CMP with elevated glucose, normal anion gap and CO2 CBG 332 Cultures pending  No evidence of DKA today no history of systemic infection patient is overall well-appearing and in no acute distress with normal vital signs, he does not meet SIRS criteria.  As patient is a diabetic and does have some mild surrounding cellulitis will begin patient on clindamycin 450 mg 3 times daily.  There is no evidence of Ludwig's angina on examination today or other deep space infections of the head or neck.  No indication for imaging or further work-up at this time.  Patient was seen and evaluated by Dr. Stevie Kern during this visit who agrees with incision and drainage, discharge with clindamycin and PCP follow-up. - Patient was given 2 Norco here for pain.  Reports that wife is here to drive him home.  Patient will continue with warm compresses at home and OTC anti-inflammatories.  At this time there does not appear to be any evidence of an acute emergency medical condition and the patient appears stable for discharge with appropriate outpatient follow up. Diagnosis was discussed with patient who verbalizes understanding of care plan and is agreeable to discharge. I have discussed return precautions with patient who verbalizes understanding of return precautions. Patient encouraged to follow-up with their PCP in 2 days. All questions answered.   Note: Portions of this report may have been transcribed using voice recognition software. Every effort was made to ensure accuracy; however, inadvertent computerized transcription errors may still be present. Final Clinical Impressions(s) / ED Diagnoses   Final diagnoses:  Abscess of face    ED Discharge Orders          Ordered    clindamycin (CLEOCIN) 150 MG capsule  3 times daily     02/20/19 1800  Deliah Boston, PA-C 02/20/19 1800    Lucrezia Starch, MD 02/22/19 [phone]

## 2019-02-20 NOTE — Discharge Instructions (Addendum)
You have been diagnosed today with facial abscess.  At this time there does not appear to be the presence of an emergent medical condition, however there is always the potential for conditions to change. Please read and follow the below instructions.  Please return to the Emergency Department immediately for any new or worsening symptoms or if your symptoms do not improve within 2 days. Please be sure to follow up with your Primary Care Provider within one week regarding your visit today; please call their office to schedule an appointment even if you are feeling better for a follow-up visit. Please take the antibiotic clindamycin as prescribed 450 mg 3 times daily for the next 7 days for treatment of your infection. Please follow-up with your primary care provider for wound recheck in 48 hours.  If you are unable to follow-up with your primary care provider for wound recheck you may return to the emergency department or go to an urgent care for recheck. Please be sure to drink plenty of water and take all of your daily home medications as prescribed by your primary care provider. Please continue to use warm soaks as discussed to help facilitate continued drainage. You have been given pain medication today called Norco.  You may not drive or operate machinery for the rest of the day as this medication may make you drowsy.  Do not drink alcohol or take other sedating medications rest the day as this will worsen side effects.  Get help right away if: You have very bad (severe) pain. You see red streaks on your skin spreading away from the abscess. You have fever or chills You have nausea/vomiting or abdominal pain You have difficulty swallowing or difficulty opening your mouth You have any new/concerning or worsening symptoms  Please read the additional information packets attached to your discharge summary.  Do not take your medicine if  develop an itchy rash, swelling in your mouth or lips, or  difficulty breathing; call 911 and seek immediate emergency medical attention if this occurs.  Note: Portions of this text may have been transcribed using voice recognition software. Every effort was made to ensure accuracy; however, inadvertent computerized transcription errors may still be present.

## 2019-02-20 NOTE — ED Triage Notes (Signed)
Pt reports 1 week with an ingrown hair on his chin but states it started to get worse, significant other "pushed puss out of it last night". Right side of chin and jaw swollen. Airway intact

## 2019-02-23 LAB — AEROBIC CULTURE W GRAM STAIN (SUPERFICIAL SPECIMEN)

## 2019-02-24 ENCOUNTER — Telehealth: Payer: Self-pay | Admitting: *Deleted

## 2019-02-24 NOTE — Telephone Encounter (Signed)
Post ED Visit - Positive Culture Follow-up  Culture report reviewed by antimicrobial stewardship pharmacist: [place] Team []  Elenor Quinones, Pharm.D. []  Heide Guile, Pharm.D., BCPS AQ-ID []  Parks Neptune, Pharm.D., BCPS []  Alycia Rossetti, Pharm.D., BCPS []  [place], Pharm.D., BCPS, AAHIVP []  Legrand Como, Pharm.D., BCPS, AAHIVP [x]  Salome Arnt, PharmD, BCPS []  Johnnette Gourd, PharmD, BCPS []  Hughes Better, PharmD, BCPS []  Leeroy Cha, PharmD []  Laqueta Linden, PharmD, BCPS []  Albertina Parr, PharmD  [place] Team []  Leodis Sias, PharmD []  Lindell Spar, PharmD []  Royetta Asal, PharmD []  Graylin Shiver, Rph []  Rema Fendt) Glennon Mac, PharmD []  Arlyn Dunning, PharmD []  Netta Cedars, PharmD []  Dia Sitter, PharmD []  Leone Haven, PharmD []  Gretta Arab, PharmD []  Theodis Shove, PharmD []  Peggyann Juba, PharmD []  Reuel Boom, PharmD   Positive wound culture Treated with Clindamycin, organism sensitive to the same and no further patient follow-up is required at this time.  Harlon Flor [facility] 02/24/2019, 4:23 PM

## 2019-03-20 DIAGNOSIS — N5201 Erectile dysfunction due to arterial insufficiency: Secondary | ICD-10-CM | POA: Diagnosis not present

## 2019-03-24 ENCOUNTER — Other Ambulatory Visit: Payer: Self-pay

## 2019-03-26 ENCOUNTER — Ambulatory Visit: Payer: BC Managed Care – PPO | Admitting: Endocrinology

## 2019-04-03 DIAGNOSIS — N5201 Erectile dysfunction due to arterial insufficiency: Secondary | ICD-10-CM | POA: Diagnosis not present

## 2019-04-07 ENCOUNTER — Encounter: Payer: Self-pay | Admitting: Endocrinology

## 2019-04-07 ENCOUNTER — Other Ambulatory Visit: Payer: Self-pay

## 2019-04-07 ENCOUNTER — Ambulatory Visit (INDEPENDENT_AMBULATORY_CARE_PROVIDER_SITE_OTHER): Payer: BC Managed Care – PPO | Admitting: Endocrinology

## 2019-04-07 VITALS — BP 106/70 | HR 94 | Ht 68.0 in | Wt 172.4 lb

## 2019-04-07 DIAGNOSIS — E1042 Type 1 diabetes mellitus with diabetic polyneuropathy: Secondary | ICD-10-CM

## 2019-04-07 DIAGNOSIS — Z23 Encounter for immunization: Secondary | ICD-10-CM | POA: Diagnosis not present

## 2019-04-07 LAB — POCT GLYCOSYLATED HEMOGLOBIN (HGB A1C): Hemoglobin A1C: 12.1 % — AB (ref 4.0–5.6)

## 2019-04-07 MED ORDER — TRESIBA FLEXTOUCH 100 UNIT/ML ~~LOC~~ SOPN: 40.0000 [IU] | PEN_INJECTOR | Freq: Every day | SUBCUTANEOUS | 11 refills | Status: DC

## 2019-04-07 NOTE — Patient Instructions (Addendum)
Please increase the Tresiba to 40 units daily.  I have sent a prescription to your pharmacy.  With this insulin, if you do miss it, you can make it up later.     check your blood sugar once a day.  vary the time of day when you check, between before the 3 meals, and at bedtime.  also check if you have symptoms of your blood sugar being too high or too low.  please keep a record of the readings and bring it to your next appointment here (or you can bring the meter itself).  You can write it on any piece of paper.  please call us sooner if your blood sugar goes below 70, or if you have a lot of readings over 200. Please come back for a follow-up appointment in 2 months.

## 2019-04-07 NOTE — Progress Notes (Signed)
Subjective:    Patient ID: Joshua Escobar, male    DOB: 1970/06/16, 48 y.o.   MRN: [id number]  HPI Pt returns for f/u of diabetes mellitus:  DM type: 1 Dx'ed: 4536 Complications: polyneuropathy Therapy: insulin since dx.   DKA: Last episode was in early 2019 Severe hypoglycemia: never Pancreatitis: never Pancreatic imaging: normal on 2018 CT Other: He works 2nd shift, distrubution center; A1c has been verified by fructosamine; he takes QD insulin, after poor results with multiple daily injections.   Interval history:  pt states he feels well in general.  no cbg record, but states cbg's vary from 211-300's.  He says he never misses the insulin.  He seldom has hypoglycemia, and these episodes are mild.   Past Medical History:  Diagnosis Date  . Migraine headache   . Type II diabetes mellitus ([place])     Past Surgical History:  Procedure Laterality Date  . LEFT HEART CATH AND CORONARY ANGIOGRAPHY N/A 11/27/2016   Procedure: Left Heart Cath and Coronary Angiography;  Surgeon: Lorretta Harp, MD;  Location: [facility] CV LAB;  Service: Cardiovascular;  Laterality: N/A;    Social History   Socioeconomic History  . Marital status: Single    Spouse name: Not on file  . Number of children: 2  . Years of education: 46  . Highest education level: Not on file  Occupational History  . Occupation: Engineer, civil (consulting)  . 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 Financial resource strain: Not hard at all  . Food insecurity    Worry: Never true    Inability: Never true  . Transportation needs    Medical: No    Non-medical: No  Tobacco Use  . Smoking status: Current Every Day Smoker    Packs/day: 0.50    Years: 20.00    Pack years: 10.00    Types: Cigarettes  . Smokeless tobacco: Never Used  Substance and Sexual Activity  . Alcohol use: Yes    Alcohol/week: 6.0 standard drinks    Types: 6 Cans of beer per week    Comment: weekends only  .  Drug use: No  . Sexual activity: Not Currently  Lifestyle  . Physical activity    Days per week: 5 days    Minutes per session: 30 min  . Stress: Not at all  Relationships  . Social Herbalist on phone: Not on file    Gets together: Not on file    Attends religious service: Not on file    Active member of club or organization: Not on file    Attends meetings of clubs or organizations: Not on file    Relationship status: Not on file  . Intimate partner violence    Fear of current or ex partner: Not on file    Emotionally abused: Not on file    Physically abused: Not on file    Forced sexual activity: Not on file  Other Topics Concern  . Not on file  Social History Narrative   Fun/Hobby: Basketball, fish, bowling.     Current Outpatient Medications on File Prior to Visit  Medication Sig Dispense Refill  . glucose blood (CONTOUR TEST) test strip 1 each by Other route 4 (four) times daily. And lancets 4/day (Patient taking differently: 1 each by Other route 4 (four) times daily. ) 120 each 11   No current facility-administered medications on file prior to visit.  No Known Allergies  Family History  Problem Relation Age of Onset  . Diabetes Mellitus II Sister   . Diabetes Sister   . Diabetes Mellitus II Brother   . Diabetes Brother   . Diabetes Mellitus II Mother   . Diabetes Mother   . Diabetes Mellitus II Father   . Diabetes Father   . Heart disease Father     BP 106/70 (BP Location: Right Arm, Patient Position: Sitting, Cuff Size: Normal)   Pulse 94   Ht '5\' 8"'  (1.727 m)   Wt 172 lb 6.4 oz (78.2 kg)   SpO2 98%   BMI 26.21 kg/m    Review of Systems Denies LOC    Objective:   Physical Exam VITAL SIGNS:  See vs page GENERAL: no distress Pulses: dorsalis pedis intact bilat.   MSK: no deformity of the feet CV: no leg edema Skin:  no ulcer on the feet.  normal color and temp on the feet. Neuro: sensation is intact to touch on the feet.    Lab  Results  Component Value Date   HGBA1C 12.1 (A) 04/07/2019       Assessment & Plan:  Type 1 DM: he needs increased rx Hypoglycemia: this limits aggressiveness of glycemic control.    Patient Instructions  Please increase the Tresiba to 40 units daily.  I have sent a prescription to your pharmacy.  With this insulin, if you do miss it, you can make it up later.     check your blood sugar once a day.  vary the time of day when you check, between before the 3 meals, and at bedtime.  also check if you have symptoms of your blood sugar being too high or too low.  please keep a record of the readings and bring it to your next appointment here (or you can bring the meter itself).  You can write it on any piece of paper.  please call us sooner if your blood sugar goes below 70, or if you have a lot of readings over 200. Please come back for a follow-up appointment in 2 months.

## 2019-04-11 ENCOUNTER — Telehealth: Payer: Self-pay

## 2019-04-11 ENCOUNTER — Other Ambulatory Visit: Payer: Self-pay

## 2019-04-11 DIAGNOSIS — E1042 Type 1 diabetes mellitus with diabetic polyneuropathy: Secondary | ICD-10-CM

## 2019-04-11 MED ORDER — TRESIBA FLEXTOUCH 100 UNIT/ML ~~LOC~~ SOPN: 40.0000 [IU] | PEN_INJECTOR | Freq: Every day | SUBCUTANEOUS | 11 refills | Status: DC

## 2019-04-11 NOTE — Telephone Encounter (Signed)
Pharmacy: [place] ([place]), [place] - [address]

## 2019-04-11 NOTE — Telephone Encounter (Signed)
insulin degludec (TRESIBA FLEXTOUCH) 100 UNIT/ML SOPN FlexTouch Pen 5 pen 11 04/11/2019    Sig - Route: Inject 0.4 mLs (40 Units total) into the skin daily. - Subcutaneous   Sent to pharmacy as: insulin degludec (TRESIBA FLEXTOUCH) 100 UNIT/ML Solution Pen-injector FlexTouch Pen   E-Prescribing Status: Receipt confirmed by pharmacy (04/11/2019 12:33 PM EST)

## 2019-04-11 NOTE — Telephone Encounter (Signed)
MEDICATION: insulin degludec (TRESIBA FLEXTOUCH) 100 UNIT/ML SOPN FlexTouch Pen  PHARMACY:    IS THIS A 90 DAY SUPPLY :   IS PATIENT OUT OF MEDICATION:   IF NOT; HOW MUCH IS LEFT:   LAST APPOINTMENT DATE: @11 /01/2019  NEXT APPOINTMENT DATE:@1 /13/2021  DO WE HAVE YOUR PERMISSION TO LEAVE A DETAILED MESSAGE:  OTHER COMMENTS:    **Let patient know to contact pharmacy at the end of the day to make sure medication is ready. **  ** Please notify patient to allow 48-72 hours to process**  **Encourage patient to contact the pharmacy for refills or they can request refills through [facility]

## 2019-04-19 ENCOUNTER — Encounter (HOSPITAL_COMMUNITY): Payer: Self-pay

## 2019-04-19 ENCOUNTER — Ambulatory Visit (HOSPITAL_COMMUNITY)
Admission: EM | Admit: 2019-04-19 | Discharge: 2019-04-19 | Disposition: A | Discharge status: Home / Self Care | Payer: BC Managed Care – PPO | Attending: Family Medicine | Admitting: Family Medicine

## 2019-04-19 ENCOUNTER — Ambulatory Visit (INDEPENDENT_AMBULATORY_CARE_PROVIDER_SITE_OTHER): Payer: BC Managed Care – PPO

## 2019-04-19 DIAGNOSIS — S61317A Laceration without foreign body of left little finger with damage to nail, initial encounter: Secondary | ICD-10-CM | POA: Diagnosis not present

## 2019-04-19 DIAGNOSIS — S62637A Displaced fracture of distal phalanx of left little finger, initial encounter for closed fracture: Secondary | ICD-10-CM | POA: Diagnosis not present

## 2019-04-19 DIAGNOSIS — W208XXA Other cause of strike by thrown, projected or falling object, initial encounter: Secondary | ICD-10-CM

## 2019-04-19 DIAGNOSIS — S62639A Displaced fracture of distal phalanx of unspecified finger, initial encounter for closed fracture: Secondary | ICD-10-CM

## 2019-04-19 DIAGNOSIS — R030 Elevated blood-pressure reading, without diagnosis of hypertension: Secondary | ICD-10-CM

## 2019-04-19 MED ORDER — CEPHALEXIN 500 MG PO CAPS: 500.0000 mg | ORAL_CAPSULE | Freq: Three times a day (TID) | ORAL | 0 refills | Status: DC

## 2019-04-19 MED ORDER — HYDROCODONE-ACETAMINOPHEN 5-325 MG PO TABS: 1.0000 | ORAL_TABLET | Freq: Four times a day (QID) | ORAL | 0 refills | Status: DC | PRN

## 2019-04-19 NOTE — ED Triage Notes (Signed)
Pt present injury to his left hand/ pinky. Pt states he was changing the brakes on a vehicle when he jammed his pinky finger.

## 2019-04-19 NOTE — Discharge Instructions (Signed)
Your blood pressure was noted to be elevated during your visit today. You may return here within the next few days to recheck if unable to see your primary care doctor. If your blood pressure remains persistently elevated, you may need to begin taking a medication.  BP (!) 144/114 (BP Location: Right Arm)    Pulse (!) 102    Temp 99.1 F (37.3 C) (Oral)    Resp 16    SpO2 100%

## 2019-04-21 NOTE — ED Provider Notes (Signed)
[place]   [id number] 04/19/19 Arrival Time: 2595  ASSESSMENT & PLAN:  1. Closed fracture of tuft of distal phalanx of finger   2. Laceration of left little finger without foreign body with damage to nail, initial encounter   3. Elevated blood pressure reading without diagnosis of hypertension     Begin: Meds ordered this encounter  Medications  . cephALEXin (KEFLEX) 500 MG capsule    Sig: Take 1 capsule (500 mg total) by mouth 3 (three) times daily.    Dispense:  21 capsule    Refill:  0  . HYDROcodone-acetaminophen (NORCO/VICODIN) 5-325 MG tablet    Sig: Take 1 tablet by mouth every 6 (six) hours as needed for moderate pain or severe pain.    Dispense:  8 tablet    Refill:  0    Procedure: Verbal consent obtained. Patient provided with risks and alternatives to the procedure. Wound copiously irrigated with NS then cleansed with betadine after digital block performed with 1% lidocaine. Wound carefully explored. No foreign body or nonviable tissue were noted. Using sterile technique, 2 interrupted 5-0 Ethilon sutures were placed to reapproximate the wound. Procedure tolerated well. No complications. Minimal bleeding. Advised to look for and return for any signs of infection such as redness, swelling, discharge, or worsening pain. Return for suture removal in 5-7 days.  Finger bandaged and finger splint placed by RN. I doubt he will need to see a hand surgeon but discussed that we will address this upon follow up. See AVS for discharge instructions.   Discharge Instructions     Your blood pressure was noted to be elevated during your visit today. You may return here within the next few days to recheck if unable to see your primary care doctor. If your blood pressure remains persistently elevated, you may need to begin taking a medication.  BP (!) 144/114 (BP Location: Right Arm)   Pulse (!) 102   Temp 99.1 F (37.3 C) (Oral)   Resp 16   SpO2 100%     He  prefers to recheck BP upon return for suture removal. Will very likely need to be started on a BP medication. Does not desire to do so tonight.  [place] Controlled Substances Registry consulted for this patient. I feel the risk/benefit ratio today is favorable for proceeding with this prescription for a controlled substance. Medication sedation precautions given.  Reviewed expectations re: course of current medical issues. Questions answered. Outlined signs and symptoms indicating need for more acute intervention. Patient verbalized understanding. After Visit Summary given.   SUBJECTIVE:  Joshua Escobar is a 48 y.o. male who presents with a injury to his LEFT 5th distal fingertip. Reports a wrench "came down hard on it and busted it open". Immediate pain. Moderate bleeding that has been controlled. No specific sensation changes over 5th finger. No specific aggravating or alleviating factors reported. Pain has stabilized. No analgesics taken. Describes normal ROM.  Td UTD: He believes so.  Increased blood pressure noted today. Reports that he has not been treated for hypertension in the past.  He reports no chest pain on exertion, no dyspnea on exertion, no swelling of ankles, no orthostatic dizziness or lightheadedness, no orthopnea or paroxysmal nocturnal dyspnea, no palpitations and no intermittent claudication symptoms.  ROS: As per HPI. All other systems negative.   Health Maintenance Due  Topic Date Due  . PNEUMOCOCCAL POLYSACCHARIDE VACCINE AGE 25-64 HIGH RISK  11/08/1973  . OPHTHALMOLOGY EXAM  11/08/1981  .  URINE MICROALBUMIN  11/08/1981    OBJECTIVE:  Vitals:   04/19/19 1740  BP: (!) 144/114  Pulse: (!) 102  Resp: 16  Temp: 99.1 F (37.3 C)  TempSrc: Oral  SpO2: 100%     General appearance: alert; no distress HEENT: [place]: AT Neck: supple with FROM CV: RRR Lungs: unlabored respirations; no coughing or wheezing Skin/LUE: horizontal laceration of the very distal  fingernail and nail bed; size: approx 0.75 cm; clean wound edges, no foreign bodies; with mild bleeding; able to bend finger at PIP and DIP Neuro: reports normal distal sensation of L 5th finger Ext: otherwise no extremity edema or abnormalities Psychological: alert and cooperative; normal mood and affect   No Known Allergies  Past Medical History:  Diagnosis Date  . Migraine headache   . Type II diabetes mellitus ([place])    Social History   Socioeconomic History  . Marital status: Single    Spouse name: Not on file  . Number of children: 2  . Years of education: 66  . Highest education level: Not on file  Occupational History  . Occupation: Engineer, civil (consulting)  . Occupation: BUILF ATM MACHINES    Employer: [employer]  Social Needs  . Financial resource strain: Not hard at all  . Food insecurity    Worry: Never true    Inability: Never true  . Transportation needs    Medical: No    Non-medical: No  Tobacco Use  . Smoking status: Current Every Day Smoker    Packs/day: 0.50    Years: 20.00    Pack years: 10.00    Types: Cigarettes  . Smokeless tobacco: Never Used  Substance and Sexual Activity  . Alcohol use: Yes    Alcohol/week: 6.0 standard drinks    Types: 6 Cans of beer per week    Comment: weekends only  . Drug use: No  . Sexual activity: Not Currently  Lifestyle  . Physical activity    Days per week: 5 days    Minutes per session: 30 min  . Stress: Not at all  Relationships  . Social Herbalist on phone: Not on file    Gets together: Not on file    Attends religious service: Not on file    Active member of club or organization: Not on file    Attends meetings of clubs or organizations: Not on file    Relationship status: Not on file  Other Topics Concern  . Not on file  Social History Narrative   Fun/Hobby: Basketball, fish, bowling.    FH: DM     Vanessa Kick, MD 04/21/19 [phone]

## 2019-04-26 ENCOUNTER — Encounter (HOSPITAL_COMMUNITY): Payer: Self-pay

## 2019-04-26 ENCOUNTER — Ambulatory Visit (HOSPITAL_COMMUNITY)
Admission: EM | Admit: 2019-04-26 | Discharge: 2019-04-26 | Disposition: A | Discharge status: Home / Self Care | Payer: BC Managed Care – PPO | Attending: Family Medicine | Admitting: Family Medicine

## 2019-04-26 ENCOUNTER — Other Ambulatory Visit: Payer: Self-pay

## 2019-04-26 DIAGNOSIS — M79645 Pain in left finger(s): Secondary | ICD-10-CM

## 2019-04-26 MED ORDER — HYDROCODONE-ACETAMINOPHEN 5-325 MG PO TABS: 1.0000 | ORAL_TABLET | Freq: Four times a day (QID) | ORAL | 0 refills | Status: AC | PRN

## 2019-04-26 NOTE — Discharge Instructions (Addendum)
Ice/cold pack over area for 10-15 min twice daily.  OK to take Tylenol 1000 mg (2 extra strength tabs) or 975 mg (3 regular strength tabs) every 6 hours as needed.  Ibuprofen 400-600 mg (2-3 over the counter strength tabs) every 6 hours as needed for pain.  Wear the splint as needed. It is more for protection.

## 2019-04-26 NOTE — ED Provider Notes (Signed)
  [place]    CSN: [id number] Arrival date & time: 04/26/19  1012  Chief Complaint  Patient presents with  . Follow-up  . finger problem     Subjective: Patient is a 48 y.o. male here for f/u finger issue.  Seen 1 week ago for a finger injury. He received pain meds and 2 simple sutures. Getting better overall. Still having quite a bit of pain and swelling. No fevers or spreading redness.   ROS: Const: No fevers  Past Medical History:  Diagnosis Date  . Migraine headache   . Type II diabetes mellitus ([facility])     Objective: BP 133/86 (BP Location: Right Arm)   Pulse 94   Temp 98.8 F (37.1 C) (Oral)   Resp 17   SpO2 99%  General: Awake, appears stated age MSK: +TTP over L 5th digit distally w soft tissue swelling Heart: brisk cap refill Lungs: No accessory muscle use Psych: Age appropriate judgment and insight, normal affect and mood   Final Clinical Impressions(s) / UC Diagnoses   Final diagnoses:  Pain of finger of left hand   2 simple sutures removed without issue.  Short refill of Norco for breakthrough pain.    Discharge Instructions     Ice/cold pack over area for 10-15 min twice daily.  OK to take Tylenol 1000 mg (2 extra strength tabs) or 975 mg (3 regular strength tabs) every 6 hours as needed.  Ibuprofen 400-600 mg (2-3 over the counter strength tabs) every 6 hours as needed for pain.  Wear the splint as needed. It is more for protection.     ED Prescriptions    Medication Sig Dispense Auth. Provider   HYDROcodone-acetaminophen (NORCO/VICODIN) 5-325 MG tablet Take 1 tablet by mouth every 6 (six) hours as needed for up to 5 days for moderate pain. 8 tablet Shelda Pal, DO        Riki Sheer [place], [place] 04/26/19 1049

## 2019-04-26 NOTE — ED Triage Notes (Signed)
Pt presents to the UC for follow up. Pt states his left little finger is painful and swollen x 7 days. Pt reports having 2 stiches in his left little finger x 7 days. Pt removed the splint this morning.

## 2019-04-26 NOTE — ED Notes (Signed)
Finger splint and suture removal performed per Dr Nani Ravens.

## 2019-05-09 ENCOUNTER — Encounter: Payer: Self-pay | Admitting: Family Medicine

## 2019-05-09 ENCOUNTER — Ambulatory Visit (INDEPENDENT_AMBULATORY_CARE_PROVIDER_SITE_OTHER): Payer: BC Managed Care – PPO | Admitting: Family Medicine

## 2019-05-09 ENCOUNTER — Other Ambulatory Visit: Payer: Self-pay

## 2019-05-09 VITALS — BP 128/86 | HR 78 | Temp 98.0°F | Resp 16 | Ht 68.0 in | Wt 170.0 lb

## 2019-05-09 DIAGNOSIS — K580 Irritable bowel syndrome with diarrhea: Secondary | ICD-10-CM | POA: Diagnosis not present

## 2019-05-09 DIAGNOSIS — S62639A Displaced fracture of distal phalanx of unspecified finger, initial encounter for closed fracture: Secondary | ICD-10-CM | POA: Diagnosis not present

## 2019-05-09 DIAGNOSIS — S61309A Unspecified open wound of unspecified finger with damage to nail, initial encounter: Secondary | ICD-10-CM

## 2019-05-09 MED ORDER — HYDROCODONE-ACETAMINOPHEN 5-325 MG PO TABS: 1.0000 | ORAL_TABLET | Freq: Four times a day (QID) | ORAL | 0 refills | Status: DC | PRN

## 2019-05-09 MED ORDER — MUPIROCIN 2 % EX OINT: 1.0000 "application " | TOPICAL_OINTMENT | Freq: Two times a day (BID) | CUTANEOUS | 0 refills | Status: DC

## 2019-05-09 MED ORDER — PROBIOTIC ACIDOPHILUS PO TABS: ORAL_TABLET | ORAL | 3 refills | Status: DC

## 2019-05-09 NOTE — Patient Instructions (Signed)
F/U 4-5 months for Physical  Call if bowels do not improve

## 2019-05-09 NOTE — Progress Notes (Signed)
Subjective:    Patient ID: Joshua Escobar, male    DOB: [birth year], 48 y.o.   MRN: [id number]  Patient presents for Finger Pain (L 5th digit fx- nail has fallen off, pain at tip of finger) and Loose Stools (at night he can hear his bowels moving, and he has increased runny bowels throughout the night)  Pt here with pain of left fifth digit.  Urgent care after he had a crush injury working on a vehicle.  He was found to have tuft fracture which was minimally displaced.  He was given a finger splint his original vision on 1121 he also had a laceration near the nail that had to be sutured.  He was given Keflex and Norco for pain.  He went back a week later had the stitches removed from his finger.  He still has swelling and pain.  His nail actually came off a few days ago he became concerned.  He has not had any recent drainage but initially had some.  He still has pain and soreness on the tip of the finger.   GI upset , at night has loud bowels sounds then will have multiple loose stools through the night. He has not hd any blood in stools, he has odor to gas and had few mild incontinence episodes    no abdominal pain with it.  This is been going on for the past few months.   Dr. Loanne Drilling is his endocrinolingy-Tresiba 40   no other meds     Has appt coming up with urology for erectile dysfunction.    Review Of Systems:  GEN- denies fatigue, fever, weight loss,weakness, recent illness HEENT- denies eye drainage, change in vision, nasal discharge, CVS- denies chest pain, palpitations RESP- denies SOB, cough, wheeze ABD- denies N/V, +change in stools, abd pain GU- denies dysuria, hematuria, dribbling, incontinence MSK- +joint pain, muscle aches, injury Neuro- denies headache, dizziness, syncope, seizure activity       Objective:    BP 128/86   Pulse 78   Temp 98 F (36.7 C) (Temporal)   Resp 16   Ht 5\' 8"  (1.727 m)   Wt 170 lb (77.1 kg)   SpO2 98%   BMI 25.85 kg/m  GEN- NAD,  alert and oriented x3 HEENT- PERRL, EOMI, non injected sclera, pink conjunctiva,  CVS- RRR, no murmur RESP-CTAB ABD-NABS,soft,NT,ND Musculoskeletal left hand fifth digit swelling from the PIP to the tip of the finger tender to palpation no erythema nail missing scab with mild erythema at the center no drainage or odor.  Good range of motion at the DIP and MIP EXT- No edema Pulses- Radial, DP- 2+        Assessment & Plan:      Problem List Items Addressed This Visit    None    Visit Diagnoses    Irritable bowel syndrome with diarrhea    -  Primary   He has underlying diabetes which is uncontrolled gastroparesis is on the differential he does not have any actual pain or vomiting also consider IBS symptoms.  We will try him on probiotics.  If this does not help I would get him over to gastroenterology.  He has not had any significant weight loss does not have any pain associated his symptoms started before he was given the recent antibiotics for the nail infection.   Relevant Medications   Lactobacillus (PROBIOTIC ACIDOPHILUS) TABS   Closed fracture of tuft of distal phalanx of finger  Recommend he continue with the finger splint and has been about 4 weeks.  He can also ice.  I have given him topical Bactroban for the nail the nail was removed and he has a scab at the center.  Since he has uncontrolled diabetes I do not want a cellulitis to occur.  I have also refilled the hydrocodone for pain.  At this time because it is a distal tuft fracture I do not think there is anything more that can be done   Avulsion of fingernail, initial encounter          Note: This dictation was prepared with Dragon dictation along with smaller phrase technology. Any transcriptional errors that result from this process are unintentional.

## 2019-05-15 ENCOUNTER — Other Ambulatory Visit: Payer: Self-pay | Admitting: Family Medicine

## 2019-05-15 NOTE — Telephone Encounter (Signed)
Requested Prescriptions   Pending Prescriptions Disp Refills  . HYDROcodone-acetaminophen (NORCO) 5-325 MG tablet 20 tablet 0    Sig: Take 1 tablet by mouth every 6 (six) hours as needed for moderate pain.    Last OV 05/09/2019    Last written 05/09/2019

## 2019-05-15 NOTE — Telephone Encounter (Signed)
Ok to refill??  Last office visit/ refill 05/09/2019, #20 tabs.

## 2019-05-16 MED ORDER — HYDROCODONE-ACETAMINOPHEN 5-325 MG PO TABS: 1.0000 | ORAL_TABLET | Freq: Four times a day (QID) | ORAL | 0 refills | Status: DC | PRN

## 2019-05-16 NOTE — Telephone Encounter (Signed)
Call placed to patient. LMTRC.  

## 2019-05-16 NOTE — Telephone Encounter (Signed)
Unfortunately when there is a fracture, the joint will ache the same as people that have arthritis, in cold temperatures  I don't recommend staying on narcotic pain medication long term  I have given 1 more refill of 10 tablets Please clarify if he wants to the referral to hand surgeon

## 2019-05-16 NOTE — Telephone Encounter (Signed)
Call placed to patient and patient made aware.   States that he would like to pursue the hand specialist.

## 2019-05-16 NOTE — Telephone Encounter (Signed)
Received call from patient.   Reports that no infection/ cellulitis noted to finger. 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 Requested refill on pain medication.   Advised that PCP will send to hand specialist as area is not improving.   Ok to refill??

## 2019-05-16 NOTE — Telephone Encounter (Signed)
  He just had this refilled, I need to know why he is still requiring pain medication. I refused the last refill request  Pain should be improved by now and the infection he had. If things are not improving or worse, we need to get him to a hand specialist

## 2019-06-02 ENCOUNTER — Other Ambulatory Visit: Payer: Self-pay

## 2019-06-02 ENCOUNTER — Ambulatory Visit (INDEPENDENT_AMBULATORY_CARE_PROVIDER_SITE_OTHER): Payer: BC Managed Care – PPO | Admitting: Endocrinology

## 2019-06-02 ENCOUNTER — Encounter: Payer: Self-pay | Admitting: Endocrinology

## 2019-06-02 DIAGNOSIS — E1042 Type 1 diabetes mellitus with diabetic polyneuropathy: Secondary | ICD-10-CM | POA: Diagnosis not present

## 2019-06-02 MED ORDER — TRESIBA FLEXTOUCH 100 UNIT/ML ~~LOC~~ SOPN: 50.0000 [IU] | PEN_INJECTOR | Freq: Every day | SUBCUTANEOUS | 11 refills | Status: DC

## 2019-06-02 NOTE — Progress Notes (Signed)
Subjective:    Patient ID: Joshua Escobar, male    DOB: Apr 14, 1971, 48 y.o.   MRN: [id number]  HPI Pt returns for f/u of diabetes mellitus:  DM type: 1 Dx'ed: 0240 Complications: polyneuropathy Therapy: insulin since dx.   DKA: Last episode was in early 2019 Severe hypoglycemia: never Pancreatitis: never Pancreatic imaging: normal on 2018 CT Other: He works 2nd shift, distrubution center; A1c has been verified by fructosamine; he takes QD insulin, after poor results with multiple daily injections.   Interval history: no cbg record, but states over the past 1-2 weeks, cbg's vary from 356-449.  He does not know why cbg is so high.  No recent steroids.  He says he does not miss the insulin.   Past Medical History:  Diagnosis Date  . Migraine headache   . Type II diabetes mellitus ([place])     Past Surgical History:  Procedure Laterality Date  . LEFT HEART CATH AND CORONARY ANGIOGRAPHY N/A 11/27/2016   Procedure: Left Heart Cath and Coronary Angiography;  Surgeon: Lorretta Harp, MD;  Location: [facility];  Service: Cardiovascular;  Laterality: N/A;    Social History   Socioeconomic History  . Marital status: Single    Spouse name: Not on file  . Number of children: 2  . Years of education: 17  . Highest education level: Not on file  Occupational History  . Occupation: Engineer, civil (consulting)  . 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 Smoking status: Current Every Day Smoker    Packs/day: 0.50    Years: 20.00    Pack years: 10.00    Types: Cigarettes  . Smokeless tobacco: Never Used  Substance and Sexual Activity  . Alcohol use: Yes    Alcohol/week: 6.0 standard drinks    Types: 6 Cans of beer per week    Comment: weekends only  . Drug use: No  . Sexual activity: Not Currently  Other Topics Concern  . Not on file  Social History Narrative   Fun/Hobby: Basketball, fish, bowling.    Social Determinants of Health   Financial  Resource Strain:   . Difficulty of Paying Living Expenses: Not on file  Food Insecurity:   . Worried About Charity fundraiser in the Last Year: Not on file  . Ran Out of Food in the Last Year: Not on file  Transportation Needs:   . Lack of Transportation (Medical): Not on file  . Lack of Transportation (Non-Medical): Not on file  Physical Activity:   . Days of Exercise per Week: Not on file  . Minutes of Exercise per Session: Not on file  Stress:   . Feeling of Stress : Not on file  Social Connections:   . Frequency of Communication with Friends and Family: Not on file  . Frequency of Social Gatherings with Friends and Family: Not on file  . Attends Religious Services: Not on file  . Active Member of Clubs or Organizations: Not on file  . Attends Archivist Meetings: Not on file  . Marital Status: Not on file  Intimate Partner Violence:   . Fear of Current or Ex-Partner: Not on file  . Emotionally Abused: Not on file  . Physically Abused: Not on file  . Sexually Abused: Not on file    Current Outpatient Medications on File Prior to Visit  Medication Sig Dispense Refill  . glucose blood (CONTOUR TEST) test strip 1 each  by Other route 4 (four) times daily. And lancets 4/day (Patient taking differently: 1 each by Other route 4 (four) times daily. ) 120 each 11  . HYDROcodone-acetaminophen (NORCO) 5-325 MG tablet Take 1 tablet by mouth every 6 (six) hours as needed for moderate pain. 20 tablet 0  . Lactobacillus (PROBIOTIC ACIDOPHILUS) TABS 1 TABLET DAILY FOR DIARRHEA 30 tablet 3  . mupirocin ointment (BACTROBAN) 2 % Apply 1 application topically 2 (two) times daily. 22 g 0   No current facility-administered medications on file prior to visit.    No Known Allergies  Family History  Problem Relation Age of Onset  . Diabetes Mellitus II Sister   . Diabetes Sister   . Diabetes Mellitus II Brother   . Diabetes Brother   . Diabetes Mellitus II Mother   . Diabetes  Mother   . Diabetes Mellitus II Father   . Diabetes Father   . Heart disease Father     BP 118/78 (BP Location: Right Arm, Patient Position: Sitting, Cuff Size: Normal)   Pulse 88   Temp 98.7 F (37.1 C)   Wt 173 lb 3.2 oz (78.6 kg)   SpO2 98%   BMI 26.33 kg/m   Review of Systems He denies hypoglycemia/n/v.    Objective:   Physical Exam VITAL SIGNS:  See vs page GENERAL: no distress Pulses: dorsalis pedis intact bilat.   MSK: no deformity of the feet CV: no leg edema Skin:  no ulcer on the feet.  normal color and temp on the feet. Neuro: sensation is intact to touch on the feet      Assessment & Plan:  Type 1 DM, with PN: worse  Patient Instructions  Please increase the Tresiba to 50 units daily.  I have sent a prescription to your pharmacy.  With this insulin, if you do miss it, you can make it up later.     check your blood sugar once a day.  vary the time of day when you check, between before the 3 meals, and at bedtime.  also check if you have symptoms of your blood sugar being too high or too low.  please keep a record of the readings and bring it to your next appointment here (or you can bring the meter itself).  You can write it on any piece of paper.  please call us sooner if your blood sugar goes below 70, or if you have a lot of readings over 200. Please come back for a follow-up appointment in 2 weeks.

## 2019-06-02 NOTE — Patient Instructions (Signed)
Please increase the Tresiba to 50 units daily.  I have sent a prescription to your pharmacy.  With this insulin, if you do miss it, you can make it up later.     check your blood sugar once a day.  vary the time of day when you check, between before the 3 meals, and at bedtime.  also check if you have symptoms of your blood sugar being too high or too low.  please keep a record of the readings and bring it to your next appointment here (or you can bring the meter itself).  You can write it on any piece of paper.  please call us sooner if your blood sugar goes below 70, or if you have a lot of readings over 200. Please come back for a follow-up appointment in 2 weeks.

## 2019-06-02 NOTE — Telephone Encounter (Signed)
Please review pt concern

## 2019-06-02 NOTE — Telephone Encounter (Signed)
I had Mr Plagge on the line put him on hold to confirm that he could be placed on today schedule with Judeth Cornfield and when I returned he was gone.  In between that time and now I have been answering calls and checking in patients and I have not gotten a call from Mr Bunton as of yet, but I do have 4 calls on hold over 12 minutes.

## 2019-06-02 NOTE — Telephone Encounter (Signed)
Patient has been scheduled for today 06/02/19 at 3 pm

## 2019-06-02 NOTE — Telephone Encounter (Signed)
Please advise 

## 2019-06-10 ENCOUNTER — Other Ambulatory Visit: Payer: Self-pay

## 2019-06-10 DIAGNOSIS — E1042 Type 1 diabetes mellitus with diabetic polyneuropathy: Secondary | ICD-10-CM

## 2019-06-10 MED ORDER — DEXCOM G6 RECEIVER DEVI: 1.0000 | 0 refills | Status: DC

## 2019-06-10 MED ORDER — DEXCOM G6 TRANSMITTER MISC: 1.0000 | 3 refills | Status: DC

## 2019-06-10 MED ORDER — DEXCOM G6 SENSOR MISC: 1.0000 | 11 refills | Status: DC

## 2019-06-11 ENCOUNTER — Ambulatory Visit: Payer: BC Managed Care – PPO | Admitting: Endocrinology

## 2019-06-18 ENCOUNTER — Other Ambulatory Visit: Payer: Self-pay

## 2019-06-20 ENCOUNTER — Ambulatory Visit: Payer: BC Managed Care – PPO | Admitting: Endocrinology

## 2019-07-24 DIAGNOSIS — Z7189 Other specified counseling: Secondary | ICD-10-CM | POA: Diagnosis not present

## 2019-07-24 DIAGNOSIS — Z20828 Contact with and (suspected) exposure to other viral communicable diseases: Secondary | ICD-10-CM | POA: Diagnosis not present

## 2019-07-24 DIAGNOSIS — Z03818 Encounter for observation for suspected exposure to other biological agents ruled out: Secondary | ICD-10-CM | POA: Diagnosis not present

## 2019-07-24 DIAGNOSIS — E119 Type 2 diabetes mellitus without complications: Secondary | ICD-10-CM | POA: Diagnosis not present

## 2019-09-10 ENCOUNTER — Encounter: Payer: BC Managed Care – PPO | Admitting: Family Medicine

## 2019-10-28 DIAGNOSIS — I219 Acute myocardial infarction, unspecified: Secondary | ICD-10-CM

## 2019-10-28 HISTORY — DX: Acute myocardial infarction, unspecified: I21.9

## 2019-11-09 DIAGNOSIS — I213 ST elevation (STEMI) myocardial infarction of unspecified site: Secondary | ICD-10-CM | POA: Diagnosis not present

## 2019-11-09 DIAGNOSIS — R069 Unspecified abnormalities of breathing: Secondary | ICD-10-CM | POA: Diagnosis not present

## 2019-11-09 DIAGNOSIS — I499 Cardiac arrhythmia, unspecified: Secondary | ICD-10-CM | POA: Diagnosis not present

## 2019-11-09 DIAGNOSIS — R52 Pain, unspecified: Secondary | ICD-10-CM | POA: Diagnosis not present

## 2019-11-14 ENCOUNTER — Ambulatory Visit: Payer: Self-pay | Admitting: Family Medicine

## 2019-11-19 ENCOUNTER — Ambulatory Visit (INDEPENDENT_AMBULATORY_CARE_PROVIDER_SITE_OTHER): Payer: BLUE CROSS/BLUE SHIELD | Admitting: Family Medicine

## 2019-11-19 ENCOUNTER — Encounter: Payer: Self-pay | Admitting: Family Medicine

## 2019-11-19 ENCOUNTER — Other Ambulatory Visit: Payer: Self-pay

## 2019-11-19 VITALS — BP 130/72 | HR 88 | Temp 97.9°F | Resp 16 | Ht 68.0 in | Wt 164.0 lb

## 2019-11-19 DIAGNOSIS — K589 Irritable bowel syndrome without diarrhea: Secondary | ICD-10-CM | POA: Insufficient documentation

## 2019-11-19 DIAGNOSIS — N529 Male erectile dysfunction, unspecified: Secondary | ICD-10-CM | POA: Diagnosis not present

## 2019-11-19 DIAGNOSIS — E1142 Type 2 diabetes mellitus with diabetic polyneuropathy: Secondary | ICD-10-CM

## 2019-11-19 DIAGNOSIS — E785 Hyperlipidemia, unspecified: Secondary | ICD-10-CM | POA: Diagnosis not present

## 2019-11-19 DIAGNOSIS — E1149 Type 2 diabetes mellitus with other diabetic neurological complication: Secondary | ICD-10-CM

## 2019-11-19 DIAGNOSIS — K580 Irritable bowel syndrome with diarrhea: Secondary | ICD-10-CM

## 2019-11-19 MED ORDER — DICYCLOMINE HCL 10 MG PO CAPS: 10.0000 mg | ORAL_CAPSULE | Freq: Three times a day (TID) | ORAL | 1 refills | Status: DC

## 2019-11-19 MED ORDER — TRESIBA FLEXTOUCH 100 UNIT/ML ~~LOC~~ SOPN: 50.0000 [IU] | PEN_INJECTOR | Freq: Every day | SUBCUTANEOUS | 11 refills | Status: DC

## 2019-11-19 NOTE — Patient Instructions (Signed)
Try the bentyl for the diarrhea You have been referred to a new endocrinologist  Referral to new urologist  Referral to GI doctor for the diarrhea F/U 4 months Physical

## 2019-11-19 NOTE — Assessment & Plan Note (Signed)
Referral to GI Will try bentyl for symptom control for now

## 2019-11-19 NOTE — Progress Notes (Signed)
Subjective:    Patient ID: Joshua Escobar, male    DOB: July 17, 1970, 49 y.o.   MRN: [id number]  Patient presents for Hospital F/ (11/09/2019- [place])   Pt here for hospital follow up  He was admitted to [facility] on 6/13due to  Chest pain  EKG was concerning for  STEMI. Taken to cath lab, showed normal arteries. Diagnosed of Acute coronary syndrome given. He was started on crestor 20mg  - he has not been taking this Advised he could D/C ASA per docmentation    DM type 2 he is was followed by endocrinlogy ( needs a new one, since he moved to [place] )  - uncontrolled, A1C was 11%,   Tresiba 50 units a day  hehas not been checking his sugars, was supposed to get the arm sensor but then he changed insurances   he is not taking metformin as prescribed by the hospital      IBS with chronic diarrhea, states probiotics dont help, has multiple BM,even accidents at night, no blood in stool     COVID-19 #1 done Pfzier, 2nd dose in June 30th     [place] in July    Was following with urology for ED, needs a closer one   Review Of Systems:  GEN- denies fatigue, fever, weight loss,weakness, recent illness HEENT- denies eye drainage, change in vision, nasal discharge, CVS- denies chest pain, palpitations RESP- denies SOB, cough, wheeze ABD- denies N/V, change in stools, abd pain GU- denies dysuria, hematuria, dribbling, incontinence MSK- denies joint pain, muscle aches, injury Neuro- denies headache, dizziness, syncope, seizure activity       Objective:    BP 130/72   Pulse 88   Temp 97.9 F (36.6 C) (Temporal)   Resp 16   Ht 5\' 8"  (1.727 m)   Wt 164 lb (74.4 kg)   SpO2 99%   BMI 24.94 kg/m  GEN- NAD, alert and oriented x3 HEENT- PERRL, EOMI, non injected sclera, pink conjunctiva, MMM, oropharynx clear Neck- Supple, no thyromegaly CVS- RRR, no murmur RESP-CTAB ABD-NABS,soft,NT,ND EXT- No edema Pulses- Radial, DP- 2+        Assessment  & Plan:      Problem List Items Addressed This Visit      Unprioritized   Diabetic neuropathy (HCC)   Relevant Medications   aspirin EC 325 MG tablet   rosuvastatin (CRESTOR) 20 MG tablet   insulin degludec (TRESIBA FLEXTOUCH) 100 UNIT/ML FlexTouch Pen   Erectile dysfunction   Relevant Orders   Ambulatory referral to Urology   HLD (hyperlipidemia)   Relevant Medications   aspirin EC 325 MG tablet   rosuvastatin (CRESTOR) 20 MG tablet   Other Relevant Orders   Lipid panel   IBS (irritable bowel syndrome)    Referral to GI Will try bentyl for symptom control for now       Relevant Medications   dicyclomine (BENTYL) 10 MG capsule   Other Relevant Orders   Ambulatory referral to Gastroenterology   Type 2 diabetes mellitus with neurological complications ([place]) - Primary    Type 2 but has had DKA, some notes from prevous endocrinologist in chart state Type 1 He is not on METFORMIN or any other oral med Will continue Tresiba at 50 units Refer to new endocrinologist, since she has moved further out  Recheck A1C, Renal function, urine micro etc Pt scheduled for eye exam He has statin but not started yet, will see what Lipids look  like He is not on ACEI      Relevant Medications   aspirin EC 325 MG tablet   rosuvastatin (CRESTOR) 20 MG tablet   insulin degludec (TRESIBA FLEXTOUCH) 100 UNIT/ML FlexTouch Pen   Other Relevant Orders   CBC with Differential/Platelet   Comprehensive metabolic panel   Hemoglobin A1c   Lipid panel   Microalbumin / creatinine urine ratio   TSH   HM DIABETES FOOT EXAM (Completed)   Ambulatory referral to Endocrinology      Note: This dictation was prepared with Dragon dictation along with smaller phrase technology. Any transcriptional errors that result from this process are unintentional.

## 2019-11-19 NOTE — Assessment & Plan Note (Signed)
Type 2 but has had DKA, some notes from prevous endocrinologist in chart state Type 1 He is not on METFORMIN or any other oral med Will continue Tresiba at 50 units Refer to new endocrinologist, since she has moved further out  Recheck A1C, Renal function, urine micro etc Pt scheduled for eye exam He has statin but not started yet, will see what Lipids look like He is not on ACEI

## 2019-11-20 LAB — CBC WITH DIFFERENTIAL/PLATELET
Absolute Monocytes: 431 cells/uL (ref 200–950)
Basophils Absolute: 39 cells/uL (ref 0–200)
Basophils Relative: 0.7 %
Eosinophils Absolute: 73 cells/uL (ref 15–500)
Eosinophils Relative: 1.3 %
HCT: 52.3 % — ABNORMAL HIGH (ref 38.5–50.0)
Hemoglobin: 16.3 g/dL (ref 13.2–17.1)
Lymphs Abs: 2206 cells/uL (ref 850–3900)
MCH: 25.9 pg — ABNORMAL LOW (ref 27.0–33.0)
MCHC: 31.2 g/dL — ABNORMAL LOW (ref 32.0–36.0)
MCV: 83.1 fL (ref 80.0–100.0)
MPV: 11.4 fL (ref 7.5–12.5)
Monocytes Relative: 7.7 %
Neutro Abs: 2850 cells/uL (ref 1500–7800)
Neutrophils Relative %: 50.9 %
Platelets: 224 10*3/uL (ref 140–400)
RBC: 6.29 10*6/uL — ABNORMAL HIGH (ref 4.20–5.80)
RDW: 13.3 % (ref 11.0–15.0)
Total Lymphocyte: 39.4 %
WBC: 5.6 10*3/uL (ref 3.8–10.8)

## 2019-11-20 LAB — LIPID PANEL
Cholesterol: 172 mg/dL (ref <200)
HDL: 56 mg/dL (ref >=40)
LDL Cholesterol (Calc): 95 mg/dL (calc)
Non-HDL Cholesterol (Calc): 116 mg/dL (calc) (ref <130)
Total CHOL/HDL Ratio: 3.1 (calc) (ref <5.0)
Triglycerides: 109 mg/dL (ref <150)

## 2019-11-20 LAB — COMPREHENSIVE METABOLIC PANEL
AG Ratio: 1.6 (calc) (ref 1.0–2.5)
ALT: 14 U/L (ref 9–46)
AST: 13 U/L (ref 10–40)
Albumin: 4.5 g/dL (ref 3.6–5.1)
Alkaline phosphatase (APISO): 94 U/L (ref 36–130)
BUN: 16 mg/dL (ref 7–25)
CO2: 28 mmol/L (ref 20–32)
Calcium: 10 mg/dL (ref 8.6–10.3)
Chloride: 99 mmol/L (ref 98–110)
Creat: 0.9 mg/dL (ref 0.60–1.35)
Globulin: 2.8 g/dL (calc) (ref 1.9–3.7)
Glucose, Bld: 375 mg/dL — ABNORMAL HIGH (ref 65–99)
Potassium: 4.2 mmol/L (ref 3.5–5.3)
Sodium: 138 mmol/L (ref 135–146)
Total Bilirubin: 0.6 mg/dL (ref 0.2–1.2)
Total Protein: 7.3 g/dL (ref 6.1–8.1)

## 2019-11-20 LAB — MICROALBUMIN / CREATININE URINE RATIO
Creatinine, Urine: 44 mg/dL (ref 20–320)
Microalb Creat Ratio: 5 mcg/mg creat (ref <30)
Microalb, Ur: 0.2 mg/dL

## 2019-11-20 LAB — HEMOGLOBIN A1C
Hgb A1c MFr Bld: 13.8 % of total Hgb — ABNORMAL HIGH (ref <5.7)
Mean Plasma Glucose: 349 (calc)
eAG (mmol/L): 19.4 (calc)

## 2019-11-20 LAB — TSH: TSH: 1.13 mIU/L (ref 0.40–4.50)

## 2019-11-25 ENCOUNTER — Encounter: Payer: Self-pay | Admitting: Internal Medicine

## 2019-12-03 DIAGNOSIS — Z23 Encounter for immunization: Secondary | ICD-10-CM | POA: Diagnosis not present

## 2019-12-11 ENCOUNTER — Encounter: Payer: Self-pay | Admitting: Family Medicine

## 2019-12-26 ENCOUNTER — Telehealth: Payer: Self-pay

## 2019-12-26 NOTE — Telephone Encounter (Signed)
Received request from [employer] to complete PA for [place]. LOV 06/02/19 and per Dr. George Hugh office note, pt was advised to f/u 2 weeks. At the time of this entry, pt has not scheduled a follow up appt as ordered. Per Dr. Everardo All, cannot complete PA without a current appt. My Chart message sent to pt informing him to schedule an appt.

## 2020-01-05 ENCOUNTER — Ambulatory Visit: Payer: BLUE CROSS/BLUE SHIELD | Admitting: "Endocrinology

## 2020-01-07 ENCOUNTER — Ambulatory Visit: Payer: BC Managed Care – PPO | Admitting: Urology

## 2020-01-16 ENCOUNTER — Ambulatory Visit: Payer: Self-pay | Admitting: "Endocrinology

## 2020-01-17 NOTE — Progress Notes (Deleted)
Referring Provider: Alycia Rossetti, MD Primary Care Physician:  Alycia Rossetti, MD Primary Gastroenterologist:  Dr. Rayne Du chief complaint on file.   HPI:   Joshua Escobar is a 49 y.o. male presenting today at the request of Buelah Manis, Modena Nunnery, MD for IBS-D.   Reviewed office visit with PCP 11/21/2019.  Patient was presenting for hospital follow-up.  Had been admitted to [facility] on 6/13 due to chest pain.  EKG concerning for STEMI.  He was taken to Cath Lab which showed normal arteries.  Diagnosed with ACS and started on Crestor 20 mg which he had not been taking.  Also with history of type 2 diabetes which was uncontrolled with need for new endocrinologist since moving.  He was not taking Metformin as prescribed by the hospital.  Noted history of IBS with chronic diarrhea stating probiotics did not help.  Had multiple BMs, even accidents at night.  Denied hematochezia.  He was started on Bentyl 10 mg 3 times daily before meals and referred to GI.  Referral was also placed to endocrinology and urology.  Labs completed 11/19/2019 remarkable for glucose 375, hemoglobin A1c 13.8.  LFTs, electrolytes, kidney function, hemoglobin, platelets, TSH within normal limits.  Today:    Needs TCS. Will hold off. Diabetes uncontrolled.   Past Medical History:  Diagnosis Date  . Migraine headache   . Type II diabetes mellitus ([place])     Past Surgical History:  Procedure Laterality Date  . LEFT HEART CATH AND CORONARY ANGIOGRAPHY N/A 11/27/2016   Procedure: Left Heart Cath and Coronary Angiography;  Surgeon: Lorretta Harp, MD;  Location: [facility];  Service: Cardiovascular;  Laterality: N/A;    Current Outpatient Medications  Medication Sig Dispense Refill  . aspirin EC 325 MG tablet Take 325 mg by mouth daily.    . Continuous Blood Gluc Receiver ([place]) [place] 1 each by Does not apply route See admin instructions. For continuous glucose monitoring; E11.9 (Patient  not taking: Reported on 11/19/2019) 1 each 0  . Continuous Blood Gluc Sensor (DEXCOM G6 SENSOR) MISC 1 each by Does not apply route See admin instructions. For use with continuous glucose monitoring system. Change sensor every 10 days; E11.9 (Patient not taking: Reported on 11/19/2019) 3 each 11  . Continuous Blood Gluc Transmit (DEXCOM G6 TRANSMITTER) MISC 1 each by Does not apply route See admin instructions. For continuous glucose monitoring; E11.9 (Patient not taking: Reported on 11/19/2019) 1 each 3  . dicyclomine (BENTYL) 10 MG capsule Take 1 capsule (10 mg total) by mouth 3 (three) times daily before meals. 90 capsule 1  . glucose blood (CONTOUR TEST) test strip 1 each by Other route 4 (four) times daily. And lancets 4/day (Patient taking differently: 1 each by Other route 4 (four) times daily. ) 120 each 11  . HYDROcodone-acetaminophen (NORCO) 5-325 MG tablet Take 1 tablet by mouth every 6 (six) hours as needed for moderate pain. (Patient not taking: Reported on 11/19/2019) 20 tablet 0  . insulin degludec (TRESIBA FLEXTOUCH) 100 UNIT/ML FlexTouch Pen Inject 0.5 mLs (50 Units total) into the skin daily. 5 pen 11  . Lactobacillus (PROBIOTIC ACIDOPHILUS) TABS 1 TABLET DAILY FOR DIARRHEA 30 tablet 3  . rosuvastatin (CRESTOR) 20 MG tablet Take 20 mg by mouth daily. (Patient not taking: Reported on 11/19/2019)     No current facility-administered medications for this visit.    Allergies as of 01/19/2020  . (No Known Allergies)  Family History  Problem Relation Age of Onset  . Diabetes Mellitus II Sister   . Diabetes Sister   . Diabetes Mellitus II Brother   . Diabetes Brother   . Diabetes Mellitus II Mother   . Diabetes Mother   . Diabetes Mellitus II Father   . Diabetes Father   . Heart disease Father     Social History   Socioeconomic History  . Marital status: Single    Spouse name: Not on file  . Number of children: 2  . Years of education: 42  . Highest education level: Not  on file  Occupational History  . Occupation: Engineer, civil (consulting)  . 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 Smoking status: Current Every Day Smoker    Packs/day: 0.50    Years: 20.00    Pack years: 10.00    Types: Cigarettes  . Smokeless tobacco: Never Used  Vaping Use  . Vaping Use: Never used  Substance and Sexual Activity  . Alcohol use: Yes    Alcohol/week: 6.0 standard drinks    Types: 6 Cans of beer per week    Comment: weekends only  . Drug use: No  . Sexual activity: Not Currently  Other Topics Concern  . Not on file  Social History Narrative   Fun/Hobby: Basketball, fish, bowling.    Social Determinants of Health   Financial Resource Strain:   . Difficulty of Paying Living Expenses: Not on file  Food Insecurity:   . Worried About Charity fundraiser in the Last Year: Not on file  . Ran Out of Food in the Last Year: Not on file  Transportation Needs:   . Lack of Transportation (Medical): Not on file  . Lack of Transportation (Non-Medical): Not on file  Physical Activity:   . Days of Exercise per Week: Not on file  . Minutes of Exercise per Session: Not on file  Stress:   . Feeling of Stress : Not on file  Social Connections:   . Frequency of Communication with Friends and Family: Not on file  . Frequency of Social Gatherings with Friends and Family: Not on file  . Attends Religious Services: Not on file  . Active Member of Clubs or Organizations: Not on file  . Attends Archivist Meetings: Not on file  . Marital Status: Not on file  Intimate Partner Violence:   . Fear of Current or Ex-Partner: Not on file  . Emotionally Abused: Not on file  . Physically Abused: Not on file  . Sexually Abused: Not on file    Review of Systems: Gen: Denies any fever, chills, fatigue, weight loss, lack of appetite.  CV: Denies chest pain, heart palpitations, peripheral edema, syncope.  Resp: Denies shortness of breath at rest  or with exertion. Denies wheezing or cough.  GI: Denies dysphagia or odynophagia. Denies jaundice, hematemesis, fecal incontinence. GU : Denies urinary burning, urinary frequency, urinary hesitancy MS: Denies joint pain, muscle weakness, cramps, or limitation of movement.  Derm: Denies rash, itching, dry skin Psych: Denies depression, anxiety, memory loss, and confusion Heme: Denies bruising, bleeding, and enlarged lymph nodes.  Physical Exam: There were no vitals taken for this visit. General:   Alert and oriented. Pleasant and cooperative. Well-nourished and well-developed.  Head:  Normocephalic and atraumatic. Eyes:  Without icterus, sclera clear and conjunctiva pink.  Ears:  Normal auditory acuity. Nose:  No deformity, discharge,  or lesions. Mouth:  No deformity or lesions, oral mucosa pink.  Neck:  Supple, without mass or thyromegaly. Lungs:  Clear to auscultation bilaterally. No wheezes, rales, or rhonchi. No distress.  Heart:  S1, S2 present without murmurs appreciated.  Abdomen:  +BS, soft, non-tender and non-distended. No HSM noted. No guarding or rebound. No masses appreciated.  Rectal:  Deferred  Msk:  Symmetrical without gross deformities. Normal posture. Pulses:  Normal pulses noted. Extremities:  Without clubbing or edema. Neurologic:  Alert and  oriented x4;  grossly normal neurologically. Skin:  Intact without significant lesions or rashes. Cervical Nodes:  No significant cervical adenopathy. Psych:  Alert and cooperative. Normal mood and affect.

## 2020-01-19 ENCOUNTER — Encounter: Payer: Self-pay | Admitting: Gastroenterology

## 2020-01-19 ENCOUNTER — Ambulatory Visit: Payer: BC Managed Care – PPO | Admitting: Gastroenterology

## 2020-01-23 ENCOUNTER — Ambulatory Visit: Payer: Self-pay | Admitting: Urology

## 2020-03-24 ENCOUNTER — Ambulatory Visit: Payer: BLUE CROSS/BLUE SHIELD | Admitting: Family Medicine

## 2020-05-03 ENCOUNTER — Encounter (HOSPITAL_COMMUNITY): Payer: Self-pay

## 2020-05-03 ENCOUNTER — Encounter: Payer: Self-pay | Admitting: Family Medicine

## 2020-05-03 ENCOUNTER — Other Ambulatory Visit: Payer: Self-pay

## 2020-05-03 ENCOUNTER — Emergency Department (HOSPITAL_COMMUNITY)
Admission: EM | Admit: 2020-05-03 | Discharge: 2020-05-03 | Disposition: A | Discharge status: Home / Self Care | Payer: BC Managed Care – PPO | Attending: Emergency Medicine | Admitting: Emergency Medicine

## 2020-05-03 DIAGNOSIS — Z5321 Procedure and treatment not carried out due to patient leaving prior to being seen by health care provider: Secondary | ICD-10-CM | POA: Diagnosis not present

## 2020-05-03 DIAGNOSIS — R55 Syncope and collapse: Secondary | ICD-10-CM | POA: Insufficient documentation

## 2020-05-03 DIAGNOSIS — K580 Irritable bowel syndrome with diarrhea: Secondary | ICD-10-CM

## 2020-05-03 DIAGNOSIS — R350 Frequency of micturition: Secondary | ICD-10-CM | POA: Insufficient documentation

## 2020-05-03 DIAGNOSIS — N529 Male erectile dysfunction, unspecified: Secondary | ICD-10-CM

## 2020-05-03 DIAGNOSIS — E1149 Type 2 diabetes mellitus with other diabetic neurological complication: Secondary | ICD-10-CM

## 2020-05-03 LAB — URINALYSIS, ROUTINE W REFLEX MICROSCOPIC
Bacteria, UA: NONE SEEN
Bilirubin Urine: NEGATIVE
Glucose, UA: >=500 mg/dL — AB
Hgb urine dipstick: NEGATIVE
Ketones, ur: NEGATIVE mg/dL
Leukocytes,Ua: NEGATIVE
Nitrite: NEGATIVE
Protein, ur: NEGATIVE mg/dL
Specific Gravity, Urine: 1.032 — ABNORMAL HIGH (ref 1.005–1.030)
pH: 5 (ref 5.0–8.0)

## 2020-05-03 LAB — CBC WITH DIFFERENTIAL/PLATELET
Abs Immature Granulocytes: 0.01 10*3/uL (ref 0.00–0.07)
Basophils Absolute: 0.1 10*3/uL (ref 0.0–0.1)
Basophils Relative: 1 %
Eosinophils Absolute: 0 10*3/uL (ref 0.0–0.5)
Eosinophils Relative: 1 %
HCT: 49.9 % (ref 39.0–52.0)
Hemoglobin: 14.9 g/dL (ref 13.0–17.0)
Immature Granulocytes: 0 %
Lymphocytes Relative: 46 %
Lymphs Abs: 2.5 10*3/uL (ref 0.7–4.0)
MCH: 25.3 pg — ABNORMAL LOW (ref 26.0–34.0)
MCHC: 29.9 g/dL — ABNORMAL LOW (ref 30.0–36.0)
MCV: 84.9 fL (ref 80.0–100.0)
Monocytes Absolute: 0.4 10*3/uL (ref 0.1–1.0)
Monocytes Relative: 7 %
Neutro Abs: 2.4 10*3/uL (ref 1.7–7.7)
Neutrophils Relative %: 45 %
Platelets: 209 10*3/uL (ref 150–400)
RBC: 5.88 MIL/uL — ABNORMAL HIGH (ref 4.22–5.81)
RDW: 14.4 % (ref 11.5–15.5)
WBC: 5.3 10*3/uL (ref 4.0–10.5)
nRBC: 0 % (ref 0.0–0.2)

## 2020-05-03 LAB — BASIC METABOLIC PANEL
Anion gap: 7 (ref 5–15)
BUN: 14 mg/dL (ref 6–20)
CO2: 30 mmol/L (ref 22–32)
Calcium: 9.1 mg/dL (ref 8.9–10.3)
Chloride: 100 mmol/L (ref 98–111)
Creatinine, Ser: 0.68 mg/dL (ref 0.61–1.24)
GFR, Estimated: >60 mL/min (ref >60)
Glucose, Bld: 359 mg/dL — ABNORMAL HIGH (ref 70–99)
Potassium: 3.9 mmol/L (ref 3.5–5.1)
Sodium: 137 mmol/L (ref 135–145)

## 2020-05-03 LAB — CBG MONITORING, ED: Glucose-Capillary: 318 mg/dL — ABNORMAL HIGH (ref 70–99)

## 2020-05-03 NOTE — ED Triage Notes (Signed)
CBG 362 before arrival, states it was in the 500's yesterday when he experienced a syncopal episode. Increased urination, increased thirst. Denies dizziness or lightheaded. States fatigue. Takes Evaristo Bury as prescribed. CBG 318 in triage.

## 2020-05-04 ENCOUNTER — Encounter (INDEPENDENT_AMBULATORY_CARE_PROVIDER_SITE_OTHER): Payer: Self-pay | Admitting: *Deleted

## 2020-05-05 ENCOUNTER — Other Ambulatory Visit: Payer: Self-pay

## 2020-05-05 ENCOUNTER — Encounter: Payer: Self-pay | Admitting: "Endocrinology

## 2020-05-05 ENCOUNTER — Ambulatory Visit (INDEPENDENT_AMBULATORY_CARE_PROVIDER_SITE_OTHER): Payer: BC Managed Care – PPO | Admitting: "Endocrinology

## 2020-05-05 VITALS — BP 128/78 | HR 96 | Ht 68.0 in | Wt 171.5 lb

## 2020-05-05 DIAGNOSIS — E1165 Type 2 diabetes mellitus with hyperglycemia: Secondary | ICD-10-CM | POA: Diagnosis not present

## 2020-05-05 DIAGNOSIS — E1149 Type 2 diabetes mellitus with other diabetic neurological complication: Secondary | ICD-10-CM

## 2020-05-05 LAB — POCT GLYCOSYLATED HEMOGLOBIN (HGB A1C): HbA1c, POC (controlled diabetic range): 12.8 % — AB (ref 0.0–7.0)

## 2020-05-05 MED ORDER — ONETOUCH VERIO VI STRP: ORAL_STRIP | 12 refills | Status: DC

## 2020-05-05 MED ORDER — TRESIBA FLEXTOUCH 100 UNIT/ML ~~LOC~~ SOPN: 60.0000 [IU] | PEN_INJECTOR | Freq: Every day | SUBCUTANEOUS | 2 refills | Status: DC

## 2020-05-05 MED ORDER — ROSUVASTATIN CALCIUM 20 MG PO TABS
20.0000 mg | ORAL_TABLET | Freq: Every day | ORAL | 1 refills | Status: DC
Start: 2020-05-05 — End: 2023-06-13

## 2020-05-05 MED ORDER — ONETOUCH VERIO W/DEVICE KIT: 1.0000 | PACK | 0 refills | Status: DC | PRN

## 2020-05-05 NOTE — Patient Instructions (Signed)

## 2020-05-05 NOTE — Progress Notes (Signed)
Endocrinology Consult Note       05/05/2020, 5:46 PM   Subjective:    Patient ID: Joshua Escobar, male    DOB: 1970/10/28.  Joshua Escobar is being seen in consultation for management of currently uncontrolled symptomatic diabetes requested by  Alycia Rossetti, MD.   Past Medical History:  Diagnosis Date  . Migraine headache   . Type II diabetes mellitus ([place])     Past Surgical History:  Procedure Laterality Date  . LEFT HEART CATH AND CORONARY ANGIOGRAPHY N/A 11/27/2016   Procedure: Left Heart Cath and Coronary Angiography;  Surgeon: Lorretta Harp, MD;  Location: [facility];  Service: Cardiovascular;  Laterality: N/A;    Social History   Socioeconomic History  . Marital status: Single    Spouse name: Not on file  . Number of children: 2  . Years of education: 85  . Highest education level: Not on file  Occupational History  . Occupation: Engineer, civil (consulting)  . 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 Smoking status: Current Every Day Smoker    Packs/day: 0.50    Years: 20.00    Pack years: 10.00    Types: Cigarettes  . Smokeless tobacco: Never Used  Vaping Use  . Vaping Use: Never used  Substance and Sexual Activity  . Alcohol use: Yes    Alcohol/week: 6.0 standard drinks    Types: 6 Cans of beer per week    Comment: weekends only  . Drug use: No  . Sexual activity: Not Currently  Other Topics Concern  . Not on file  Social History Narrative   Fun/Hobby: Basketball, fish, bowling.    Social Determinants of Health   Financial Resource Strain:   . Difficulty of Paying Living Expenses: Not on file  Food Insecurity:   . Worried About Charity fundraiser in the Last Year: Not on file  . Ran Out of Food in the Last Year: Not on file  Transportation Needs:   . Lack of Transportation (Medical): Not on file  . Lack of Transportation  (Non-Medical): Not on file  Physical Activity:   . Days of Exercise per Week: Not on file  . Minutes of Exercise per Session: Not on file  Stress:   . Feeling of Stress : Not on file  Social Connections:   . Frequency of Communication with Friends and Family: Not on file  . Frequency of Social Gatherings with Friends and Family: Not on file  . Attends Religious Services: Not on file  . Active Member of Clubs or Organizations: Not on file  . Attends Archivist Meetings: Not on file  . Marital Status: Not on file    Family History  Problem Relation Age of Onset  . Diabetes Mellitus II Sister   . Diabetes Sister   . Diabetes Mellitus II Brother   . Diabetes Brother   . Diabetes Mellitus II Mother   . Diabetes Mother   . Diabetes Mellitus II Father   . Diabetes Father   . Heart disease Father  Outpatient Encounter Medications as of 05/05/2020  Medication Sig  . aspirin EC 325 MG tablet Take 325 mg by mouth daily.  . Blood Glucose Monitoring Suppl (ONETOUCH VERIO) w/Device KIT 1 each by Does not apply route as needed.  . Continuous Blood Gluc Receiver ([place]offman) Wray 1 each by Does not apply route See admin instructions. For continuous glucose monitoring; E11.9 (Patient not taking: Reported on 11/19/2019)  . Continuous Blood Gluc Sensor (DEXCOM G6 SENSOR) MISC 1 each by Does not apply route See admin instructions. For use with continuous glucose monitoring system. Change sensor every 10 days; E11.9 (Patient not taking: Reported on 11/19/2019)  . Continuous Blood Gluc Transmit (DEXCOM G6 TRANSMITTER) MISC 1 each by Does not apply route See admin instructions. For continuous glucose monitoring; E11.9 (Patient not taking: Reported on 11/19/2019)  . glucose blood (ONETOUCH VERIO) test strip Use as instructed  . HYDROcodone-acetaminophen (NORCO) 5-325 MG tablet Take 1 tablet by mouth every 6 (six) hours as needed for moderate pain. (Patient not taking: Reported on  11/19/2019)  . insulin degludec (TRESIBA FLEXTOUCH) 100 UNIT/ML FlexTouch Pen Inject 60 Units into the skin at bedtime.  . Lactobacillus (PROBIOTIC ACIDOPHILUS) TABS 1 TABLET DAILY FOR DIARRHEA  . rosuvastatin (CRESTOR) 20 MG tablet Take 1 tablet (20 mg total) by mouth daily.  . [DISCONTINUED] dicyclomine (BENTYL) 10 MG capsule Take 1 capsule (10 mg total) by mouth 3 (three) times daily before meals.  . [DISCONTINUED] glucose blood (CONTOUR TEST) test strip 1 each by Other route 4 (four) times daily. And lancets 4/day (Patient taking differently: 1 each by Other route 4 (four) times daily. )  . [DISCONTINUED] insulin degludec (TRESIBA FLEXTOUCH) 100 UNIT/ML FlexTouch Pen Inject 0.5 mLs (50 Units total) into the skin daily.  . [DISCONTINUED] rosuvastatin (CRESTOR) 20 MG tablet Take 20 mg by mouth daily. (Patient not taking: Reported on 05/05/2020)   No facility-administered encounter medications on file as of 05/05/2020.    ALLERGIES: No Known Allergies  VACCINATION STATUS: Immunization History  Administered Date(s) Administered  . Influenza,inj,Quad PF,6+ Mos 06/11/2017, 03/21/2018, 04/07/2019  . Pneumococcal Polysaccharide-23 02/27/2019  . Tdap 08/28/2017    Diabetes He presents for his initial diabetic visit. He has type 2 diabetes mellitus. Onset time: he was diagnosed at approx age of 51 years. His disease course has been worsening (he has 5 a1c measurements near 13 % over the last year.). There are no hypoglycemic associated symptoms. Pertinent negatives for hypoglycemia include no confusion, headaches, pallor or seizures. Associated symptoms include blurred vision, foot paresthesias, polydipsia and polyuria. Pertinent negatives for diabetes include no chest pain, no fatigue, no polyphagia and no weakness. There are no hypoglycemic complications. Symptoms are worsening. There are no diabetic complications. Risk factors for coronary artery disease include diabetes mellitus, dyslipidemia,  male sex and tobacco exposure. Current diabetic treatment includes insulin injections (he is on Tresiba 50 units , admittedly inconsistent.). His weight is fluctuating minimally. He is following a generally unhealthy diet. When asked about meal planning, he reported none. He has not had a previous visit with a dietitian. He participates in exercise intermittently. (He did not bring any meter nor logs, his POC a1c was 12.8%. His a1c measurements since 2015 are averaging above 13%.)  Hyperlipidemia This is a chronic problem. The current episode started more than 1 year ago. The problem is uncontrolled. Exacerbating diseases include diabetes. Pertinent negatives include no chest pain, myalgias or shortness of breath. Current antihyperlipidemic treatment includes statins (he ranout of his  crestor). Risk factors for coronary artery disease include diabetes mellitus, dyslipidemia and male sex.     Review of Systems  Constitutional: Negative for chills, fatigue, fever and unexpected weight change.  HENT: Negative for dental problem, mouth sores and trouble swallowing.   Eyes: Positive for blurred vision. Negative for visual disturbance.  Respiratory: Negative for cough, choking, chest tightness, shortness of breath and wheezing.   Cardiovascular: Negative for chest pain, palpitations and leg swelling.  Gastrointestinal: Negative for abdominal distention, abdominal pain, constipation, diarrhea, nausea and vomiting.  Endocrine: Positive for polydipsia and polyuria. Negative for polyphagia.  Genitourinary: Negative for dysuria, flank pain, hematuria and urgency.  Musculoskeletal: Negative for back pain, gait problem, myalgias and neck pain.  Skin: Negative for pallor, rash and wound.  Neurological: Negative for seizures, syncope, weakness, numbness and headaches.  Psychiatric/Behavioral: Negative.  Negative for confusion and dysphoric mood.    Objective:    Vitals with BMI 05/05/2020 05/03/2020 05/03/2020   Height _0  - _1   Weight 171 lbs 8 oz - 170 lbs  BMI 74.12 - 87.86  Systolic 767 209 -  Diastolic 78 83 -  Pulse 96 76 -    BP 128/78   Pulse 96   Ht _2  (1.727 m)   Wt 171 lb 8 oz (77.8 kg)   BMI 26.08 kg/m   Wt Readings from Last 3 Encounters:  05/05/20 171 lb 8 oz (77.8 kg)  05/03/20 170 lb (77.1 kg)  11/19/19 164 lb (74.4 kg)     Physical Exam Constitutional:      General: He is not in acute distress.    Appearance: He is well-developed.  HENT:     Head: Normocephalic and atraumatic.  Neck:     Thyroid: No thyromegaly.     Trachea: No tracheal deviation.  Cardiovascular:     Rate and Rhythm: Normal rate.     Pulses:          Dorsalis pedis pulses are 1+ on the right side and 1+ on the left side.       Posterior tibial pulses are 1+ on the right side and 1+ on the left side.     Heart sounds: S1 normal and S2 normal. No murmur heard.  No gallop.   Pulmonary:     Effort: Pulmonary effort is normal. No respiratory distress.     Breath sounds: No wheezing.  Abdominal:     General: There is no distension.     Tenderness: There is no abdominal tenderness. There is no guarding.  Musculoskeletal:     Right shoulder: No swelling or deformity.     Cervical back: Normal range of motion and neck supple.  Skin:    General: Skin is warm and dry.     Findings: No rash.     Nails: There is no clubbing.  Neurological:     Mental Status: He is alert and oriented to person, place, and time.     Cranial Nerves: No cranial nerve deficit.     Sensory: No sensory deficit.     Gait: Gait normal.     Deep Tendon Reflexes: Reflexes are normal and symmetric.  Psychiatric:        Speech: Speech normal.        Behavior: Behavior normal. Behavior is cooperative.        Thought Content: Thought content normal.        Judgment: Judgment normal.       CMP (  most recent) CMP     Component Value Date/Time   NA 137 05/03/2020 1405   K 3.9 05/03/2020 1405   CL 100  05/03/2020 1405   CO2 30 05/03/2020 1405   GLUCOSE 359 (H) 05/03/2020 1405   BUN 14 05/03/2020 1405   CREATININE 0.68 05/03/2020 1405   CREATININE 0.90 11/19/2019 1237   CALCIUM 9.1 05/03/2020 1405   PROT 7.3 11/19/2019 1237   ALBUMIN 4.9 06/10/2017 1748   AST 13 11/19/2019 1237   ALT 14 11/19/2019 1237   ALKPHOS 111 06/10/2017 1748   BILITOT 0.6 11/19/2019 1237   GFRNONAA >60 05/03/2020 1405   GFRAA >60 02/20/2019 1532     Diabetic Labs (most recent): Lab Results  Component Value Date   HGBA1C 12.8 (A) 05/05/2020   HGBA1C 13.8 (H) 11/19/2019   HGBA1C 12.1 (A) 04/07/2019     Lipid Panel ( most recent) Lipid Panel     Component Value Date/Time   CHOL 172 11/19/2019 1237   TRIG 109 11/19/2019 1237   HDL 56 11/19/2019 1237   CHOLHDL 3.1 11/19/2019 1237   VLDL 9 11/27/2016 0254   LDLCALC 95 11/19/2019 1237      Lab Results  Component Value Date   TSH 1.13 11/19/2019   TSH 2.01 08/29/2018   TSH 1.225 11/25/2016   TSH 1.356 06/30/2013      Assessment & Plan:   1. Type 2 diabetes mellitus with hyperglycemia, without long-term current use of insulin ([facility])  - Joshua Escobar has currently uncontrolled symptomatic type 2 DM since  49 years of age,  with most recent A1c of 12.8%. Recent labs reviewed. His a1c measurement since 2015 average above 13% ! - I had a long discussion with him about the progressive nature of diabetes and the pathology behind its complications. -his diabetes is complicated by smoking, inadequate engagement, ETOH and he remains at a high risk for more acute and chronic complications which include CAD, CVA, CKD, retinopathy, and neuropathy. These are all discussed in detail with him.  - I have counseled him on diet  and weight management  by adopting a carbohydrate restricted/protein rich diet. Patient is encouraged to switch to  unprocessed or minimally processed     complex starch and increased protein intake (animal or plant source), fruits, and  vegetables. -  he is advised to stick to a routine mealtimes to eat 3 meals  a day and avoid unnecessary snacks ( to snack only to correct hypoglycemia).   - he acknowledges that there is a room for improvement in his food and drink choices. - Suggestion is made for him to avoid simple carbohydrates  from his diet including Cakes, Sweet Desserts, Ice Cream, Soda (diet and regular), Sweet Tea, Candies, Chips, Cookies, Store Bought Juices, Alcohol in Excess of  1-2 drinks a day, Artificial Sweeteners,  Coffee Creamer, and "Sugar-free" Products. This will help patient to have more stable blood glucose profile and potentially avoid unintended weight gain.  - he will be scheduled with Jearld Fenton, RDN, CDE for diabetes education.  - I have approached him with the following individualized plan to manage  his diabetes and patient agrees:   -In light of his chronic glycemic burden, he will need MDI in order for him to achieve and maintain control of diabetes to target. - For this to happen , he has to engage for proper monitoring of blood glucose for safe use of insulin. Accordingly, he is approached to start  strict  monitoring of glucose 4 times a day-before meals and at bedtime and return in 1 week with his meter and logs. -In the meantime, h is advised to increase Tresiba to 60 units qhs.  - he is warned not to take insulin without proper monitoring per orders.  - he is encouraged to call clinic for blood glucose levels less than 70 or above 300 mg /dl. -he will be considered for prandial insulin next visit, he will also benefit from CGM. - he is not a suitable candidate for SGLT2 I, incretin  Therapy. - he will need work up to characterize his diabetes properly.  - Specific targets for  A1c;  LDL, HDL,  and Triglycerides were discussed with the patient.  2) Blood Pressure /Hypertension:  his blood pressure is  controlled to target.   he is not on anti HTN meds. 3) Lipids/Hyperlipidemia:    Review of his recent lipid panel showed  controlled  LDL at 95 .  he  is advised to resume and continue Crestor 20 mg daily at bedtime.  Side effects and precautions discussed with him.  4)  Weight/Diet:  Body mass index is 26.08 kg/m.  -   he is not a candidate for major  weight loss. I discussed with him the fact that loss of 5 - 10% of his  current body weight will have the most impact on his diabetes management.  Exercise, and detailed carbohydrates information provided  -  detailed on discharge instructions.  5) Chronic Care/Health Maintenance:  -he  is on Statin medications and  is encouraged to initiate and continue to follow up with Ophthalmology, Dentist,  Podiatrist at least yearly or according to recommendations, and advised to  quit smoking and ETOH. I have recommended yearly flu vaccine and pneumonia vaccine at least every 5 years; moderate intensity exercise for up to 150 minutes weekly; and  sleep for at least 7 hours a day.  - he is  advised to maintain close follow up with [facility], Modena Nunnery, MD for primary care needs, as well as his other providers for optimal and coordinated care.   - Time spent in this patient care: 60 min, of which > 50% was spent in  counseling  him about his chronically uncontrolled diabetes, HPL  and the rest reviewing his blood glucose logs , discussing his hypoglycemia and hyperglycemia episodes, reviewing his current and  previous labs / studies  ( including abstraction from other facilities) and medications  doses and developing a  long term treatment plan based on the latest standards of care/ guidelines; and documenting his care.    Please refer to Patient Instructions for Blood Glucose Monitoring and Insulin/Medications Dosing Guide"  in media tab for additional information. Please  also refer to " Patient Self Inventory" in the Media  tab for reviewed elements of pertinent patient history.  Joshua Escobar participated in the discussions, expressed  understanding, and voiced agreement with the above plans.  All questions were answered to his satisfaction. he is encouraged to contact clinic should he have any questions or concerns prior to his return visit.   Follow up plan: - Return in about 1 week (around 05/12/2020) for F/U with Meter and Logs Only - no Labs.  Glade Lloyd, MD [facility] [address] [place], [place] [address] Phone: [phone]  Fax: [phone]    05/05/2020, 5:46 PM  This note was partially dictated with voice recognition software. Similar sounding words  can be transcribed inadequately or may not  be corrected upon review.

## 2020-05-13 ENCOUNTER — Ambulatory Visit: Payer: BC Managed Care – PPO | Admitting: "Endocrinology

## 2020-05-19 ENCOUNTER — Ambulatory Visit: Payer: BC Managed Care – PPO | Admitting: "Endocrinology

## 2020-06-07 DIAGNOSIS — R6889 Other general symptoms and signs: Secondary | ICD-10-CM | POA: Diagnosis not present

## 2020-06-14 ENCOUNTER — Encounter: Payer: BC Managed Care – PPO | Attending: "Endocrinology | Admitting: Nutrition

## 2020-06-14 ENCOUNTER — Encounter: Payer: Self-pay | Admitting: "Endocrinology

## 2020-06-14 ENCOUNTER — Telehealth (INDEPENDENT_AMBULATORY_CARE_PROVIDER_SITE_OTHER): Payer: BC Managed Care – PPO | Admitting: "Endocrinology

## 2020-06-14 DIAGNOSIS — E0842 Diabetes mellitus due to underlying condition with diabetic polyneuropathy: Secondary | ICD-10-CM

## 2020-06-14 DIAGNOSIS — E1165 Type 2 diabetes mellitus with hyperglycemia: Secondary | ICD-10-CM | POA: Insufficient documentation

## 2020-06-14 DIAGNOSIS — E1149 Type 2 diabetes mellitus with other diabetic neurological complication: Secondary | ICD-10-CM

## 2020-06-14 DIAGNOSIS — E782 Mixed hyperlipidemia: Secondary | ICD-10-CM | POA: Insufficient documentation

## 2020-06-14 MED ORDER — TRESIBA FLEXTOUCH 100 UNIT/ML ~~LOC~~ SOPN: 70.0000 [IU] | PEN_INJECTOR | Freq: Every day | SUBCUTANEOUS | 1 refills | Status: DC

## 2020-06-14 NOTE — Patient Instructions (Signed)
                                     Advice for Weight Management  -For most of us the best way to lose weight is by diet management. Generally speaking, diet management means consuming less calories intentionally which over time brings about progressive weight loss.  This can be achieved more effectively by restricting carbohydrate consumption to the minimum possible.  So, it is critically important to know your numbers: how much calorie you are consuming and how much calorie you need. More importantly, our carbohydrates sources should be unprocessed or minimally processed complex starch food items.   Sometimes, it is important to balance nutrition by increasing protein intake (animal or plant source), fruits, and vegetables.  -Sticking to a routine mealtime to eat 3 meals a day and avoiding unnecessary snacks is shown to have a big role in weight control. Under normal circumstances, the only time we lose real weight is when we are hungry, so allow hunger to take place- hunger means no food between meal times, only water.  It is not advisable to starve.   -It is better to avoid simple carbohydrates including: Cakes, Sweet Desserts, Ice Cream, Soda (diet and regular), Sweet Tea, Candies, Chips, Cookies, Store Bought Juices, Alcohol in Excess of  1-2 drinks a day, Lemonade,  Artificial Sweeteners, Doughnuts, Coffee Creamers, "Sugar-free" Products, etc, etc.  This is not a complete list.....    -Consulting with certified diabetes educators is proven to provide you with the most accurate and current information on diet.  Also, you may be  interested in discussing diet options/exchanges , we can schedule a visit with Joshua Escobar, RDN, CDE for individualized nutrition education.  -Exercise: If you are able: 30 -60 minutes a day ,4 days a week, or 150 minutes a week.  The longer the better.  Combine stretch, strength, and aerobic activities.  If you were told in the  past that you have high risk for cardiovascular diseases, you may seek evaluation by your heart doctor prior to initiating moderate to intense exercise programs.                                  Additional Care Considerations for Diabetes   -Diabetes  is a chronic disease.  The most important care consideration is regular follow-up with your diabetes care provider with the goal being avoiding or delaying its complications and to take advantage of advances in medications and technology.    -Type 2 diabetes is known to coexist with other important comorbidities such as high blood pressure and high cholesterol.  It is critical to control not only the diabetes but also the high blood pressure and high cholesterol to minimize and delay the risk of complications including coronary artery disease, stroke, amputations, blindness, etc.    - Studies showed that people with diabetes will benefit from a class of medications known as ACE inhibitors and statins.  Unless there are specific reasons not to be on these medications, the standard of care is to consider getting one from these groups of medications at an optimal doses.  These medications are generally considered safe and proven to help protect the heart and the kidneys.    - People with diabetes are encouraged to initiate and maintain regular follow-up with eye doctors, foot   doctors, dentists , and if necessary heart and kidney doctors.     - It is highly recommended that people with diabetes quit smoking or stay away from smoking, and get yearly  flu vaccine and pneumonia vaccine at least every 5 years.  One other important lifestyle recommendation is to ensure adequate sleep - at least 6-7 hours of uninterrupted sleep at night.  -Exercise: If you are able: 30 -60 minutes a day, 4 days a week, or 150 minutes a week.  The longer the better.  Combine stretch, strength, and aerobic activities.  If you were told in the past that you have high risk for  cardiovascular diseases, you may seek evaluation by your heart doctor prior to initiating moderate to intense exercise programs.          

## 2020-06-14 NOTE — Progress Notes (Signed)
No show. Opened in error

## 2020-06-14 NOTE — Progress Notes (Signed)
06/14/2020, 3:31 PM                                                     Endocrinology Telehealth Visit Follow up Note -During COVID -19 Pandemic  This visit type was conducted  via telephone due to national recommendations for restrictions regarding the COVID-19 Pandemic  in an effort to limit this patient's exposure and mitigate transmission of the corona virus.  Due to his co-morbid illnesses, Joshua Escobar is at  moderate to high risk for complications without adequate follow up.  This format is felt to be most appropriate for him at this time.  I connected with this patient on 06/14/2020   by telephone and verified that I am speaking with the correct person using two identifiers. Joshua Escobar, 1971-01-19. he has verbally consented to this visit. I was in my office and patient was in his residence. No other persons were with me during the encounter. All issues noted in this document were discussed and addressed. The format was not optimal for physical exam.    Subjective:    Patient ID: Joshua Escobar, male    DOB: [birth year].  Joshua Escobar is being engaged in telehealth via telephone after he was seen in consultation for management of currently uncontrolled symptomatic diabetes requested by  Alycia Rossetti, MD.   Past Medical History:  Diagnosis Date  . Migraine headache   . Type II diabetes mellitus ([place])     Past Surgical History:  Procedure Laterality Date  . LEFT HEART CATH AND CORONARY ANGIOGRAPHY N/A 11/27/2016   Procedure: Left Heart Cath and Coronary Angiography;  Surgeon: Lorretta Harp, MD;  Location: [facility];  Service: Cardiovascular;  Laterality: N/A;    Social History   Socioeconomic History  . Marital status: Single    Spouse name: Not on file  . Number of children: 2  . Years of education: 35  . Highest education level: Not on file  Occupational History   . Occupation: Engineer, civil (consulting)  . 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 Smoking status: Current Every Day Smoker    Packs/day: 0.50    Years: 20.00    Pack years: 10.00    Types: Cigarettes  . Smokeless tobacco: Never Used  Vaping Use  . Vaping Use: Never used  Substance and Sexual Activity  . Alcohol use: Yes    Alcohol/week: 6.0 standard drinks    Types: 6 Cans of beer per week    Comment: weekends only  . Drug use: No  . Sexual activity: Not Currently  Other Topics Concern  . Not on file  Social History Narrative   Fun/Hobby: Basketball, fish, bowling.    Social Determinants of Health   Financial Resource Strain: Not on file  Food Insecurity: Not on file  Transportation Needs: Not on file  Physical Activity: Not on file  Stress: Not on file  Social Connections: Not on file    Family History  Problem Relation Age of Onset  . Diabetes Mellitus II Sister   . Diabetes Sister   . Diabetes Mellitus II Brother   . Diabetes Brother   . Diabetes Mellitus II Mother   . Diabetes Mother   . Diabetes Mellitus II Father   . Diabetes Father   . Heart disease Father     Outpatient Encounter Medications as of 06/14/2020  Medication Sig  . aspirin EC 325 MG tablet Take 325 mg by mouth daily.  . Blood Glucose Monitoring Suppl (ONETOUCH VERIO) w/Device KIT 1 each by Does not apply route as needed.  . Continuous Blood Gluc Receiver ([place]) [place] 1 each by Does not apply route See admin instructions. For continuous glucose monitoring; E11.9 (Patient not taking: Reported on 11/19/2019)  . Continuous Blood Gluc Sensor (DEXCOM G6 SENSOR) MISC 1 each by Does not apply route See admin instructions. For use with continuous glucose monitoring system. Change sensor every 10 days; E11.9 (Patient not taking: Reported on 11/19/2019)  . Continuous Blood Gluc Transmit (DEXCOM G6 TRANSMITTER) MISC 1 each by Does not apply route See admin  instructions. For continuous glucose monitoring; E11.9 (Patient not taking: Reported on 11/19/2019)  . glucose blood (ONETOUCH VERIO) test strip Use as instructed  . HYDROcodone-acetaminophen (NORCO) 5-325 MG tablet Take 1 tablet by mouth every 6 (six) hours as needed for moderate pain. (Patient not taking: Reported on 11/19/2019)  . insulin degludec (TRESIBA FLEXTOUCH) 100 UNIT/ML FlexTouch Pen Inject 70 Units into the skin at bedtime.  . Lactobacillus (PROBIOTIC ACIDOPHILUS) TABS 1 TABLET DAILY FOR DIARRHEA  . rosuvastatin (CRESTOR) 20 MG tablet Take 1 tablet (20 mg total) by mouth daily.  . [DISCONTINUED] insulin degludec (TRESIBA FLEXTOUCH) 100 UNIT/ML FlexTouch Pen Inject 60 Units into the skin at bedtime.   No facility-administered encounter medications on file as of 06/14/2020.    ALLERGIES: No Known Allergies  VACCINATION STATUS: Immunization History  Administered Date(s) Administered  . Influenza,inj,Quad PF,6+ Mos 06/11/2017, 03/21/2018, 04/07/2019  . Pneumococcal Polysaccharide-23 02/27/2019  . Tdap 08/28/2017    Diabetes He presents for his follow-up diabetic visit. He has type 2 diabetes mellitus. Onset time: he was diagnosed at approx age of 30 years. His disease course has been improving (he has 5 a1c measurements near 13 % over the last year.). There are no hypoglycemic associated symptoms. Pertinent negatives for hypoglycemia include no confusion, headaches, pallor or seizures. Associated symptoms include blurred vision, foot paresthesias, polydipsia and polyuria. Pertinent negatives for diabetes include no chest pain, no fatigue, no polyphagia and no weakness. There are no hypoglycemic complications. Symptoms are worsening. There are no diabetic complications. Risk factors for coronary artery disease include diabetes mellitus, dyslipidemia, male sex and tobacco exposure. Current diabetic treatment includes insulin injections (he is on Tresiba 50 units , admittedly  inconsistent.). His weight is fluctuating minimally. He is following a generally unhealthy diet. When asked about meal planning, he reported none. He has not had a previous visit with a dietitian. He participates in exercise intermittently. His home blood glucose trend is decreasing steadily. His breakfast blood glucose range is generally 180-200 mg/dl. His lunch blood glucose range is generally 140-180 mg/dl. His dinner blood glucose range is generally 140-180 mg/dl. His bedtime blood glucose range is generally 140-180 mg/dl. His overall blood glucose range is 140-180 mg/dl. (He reports that his fasting glycemic profile is between 202 125, daytime readings are  between 150 and 195.  His recent  POC a1c was 12.8%. His a1c measurements since 2015 are averaging above 13%.)  Hyperlipidemia This is a chronic problem. The current episode started more than 1 year ago. The problem is uncontrolled. Exacerbating diseases include diabetes. Pertinent negatives include no chest pain, myalgias or shortness of breath. Current antihyperlipidemic treatment includes statins (he ranout of his crestor). Risk factors for coronary artery disease include diabetes mellitus, dyslipidemia and male sex.     Review of Systems  Constitutional: Negative for chills, fatigue, fever and unexpected weight change.  HENT: Negative for dental problem, mouth sores and trouble swallowing.   Eyes: Positive for blurred vision. Negative for visual disturbance.  Respiratory: Negative for cough, choking, chest tightness, shortness of breath and wheezing.   Cardiovascular: Negative for chest pain, palpitations and leg swelling.  Gastrointestinal: Negative for abdominal distention, abdominal pain, constipation, diarrhea, nausea and vomiting.  Endocrine: Positive for polydipsia and polyuria. Negative for polyphagia.  Genitourinary: Negative for dysuria, flank pain, hematuria and urgency.  Musculoskeletal: Negative for back pain, gait problem,  myalgias and neck pain.  Skin: Negative for pallor, rash and wound.  Neurological: Negative for seizures, syncope, weakness, numbness and headaches.  Psychiatric/Behavioral: Negative.  Negative for confusion and dysphoric mood.    Objective:    Vitals with BMI 05/05/2020 05/03/2020 05/03/2020  Height '5\' 8"'  - '5\' 8"'   Weight 171 lbs 8 oz - 170 lbs  BMI 54.62 - 70.35  Systolic 009 381 -  Diastolic 78 83 -  Pulse 96 76 -    There were no vitals taken for this visit.  Wt Readings from Last 3 Encounters:  05/05/20 171 lb 8 oz (77.8 kg)  05/03/20 170 lb (77.1 kg)  11/19/19 164 lb (74.4 kg)      CMP ( most recent) CMP     Component Value Date/Time   NA 137 05/03/2020 1405   K 3.9 05/03/2020 1405   CL 100 05/03/2020 1405   CO2 30 05/03/2020 1405   GLUCOSE 359 (H) 05/03/2020 1405   BUN 14 05/03/2020 1405   CREATININE 0.68 05/03/2020 1405   CREATININE 0.90 11/19/2019 1237   CALCIUM 9.1 05/03/2020 1405   PROT 7.3 11/19/2019 1237   ALBUMIN 4.9 06/10/2017 1748   AST 13 11/19/2019 1237   ALT 14 11/19/2019 1237   ALKPHOS 111 06/10/2017 1748   BILITOT 0.6 11/19/2019 1237   GFRNONAA >60 05/03/2020 1405   GFRAA >60 02/20/2019 1532     Diabetic Labs (most recent): Lab Results  Component Value Date   HGBA1C 12.8 (A) 05/05/2020   HGBA1C 13.8 (H) 11/19/2019   HGBA1C 12.1 (A) 04/07/2019     Lipid Panel ( most recent) Lipid Panel     Component Value Date/Time   CHOL 172 11/19/2019 1237   TRIG 109 11/19/2019 1237   HDL 56 11/19/2019 1237   CHOLHDL 3.1 11/19/2019 1237   VLDL 9 11/27/2016 0254   LDLCALC 95 11/19/2019 1237      Lab Results  Component Value Date   TSH 1.13 11/19/2019   TSH 2.01 08/29/2018   TSH 1.225 11/25/2016   TSH 1.356 06/30/2013      Assessment & Plan:   1. Type 2 diabetes mellitus with hyperglycemia, without long-term current use of insulin (HCC)  - WESLY WHISENANT has currently uncontrolled symptomatic type 2 DM since  50 years of age,  with  most recent A1c of 12.8%. Recent labs reviewed.  He reports that his fasting  glycemic profile is between 202 125, daytime readings are between 150 and 195.  His recent  POC a1c was 12.8%. His a1c measurements since 2015 are averaging above 13%.  - I had a long discussion with him about the progressive nature of diabetes and the pathology behind its complications. -his diabetes is complicated by smoking, inadequate engagement, ETOH and he remains at a high risk for more acute and chronic complications which include CAD, CVA, CKD, retinopathy, and neuropathy. These are all discussed in detail with him.  - I have counseled him on diet  and weight management  by adopting a carbohydrate restricted/protein rich diet. Patient is encouraged to switch to  unprocessed or minimally processed     complex starch and increased protein intake (animal or plant source), fruits, and vegetables. -  he is advised to stick to a routine mealtimes to eat 3 meals  a day and avoid unnecessary snacks ( to snack only to correct hypoglycemia).   - he acknowledges that there is a room for improvement in his food and drink choices. - Suggestion is made for him to avoid simple carbohydrates  from his diet including Cakes, Sweet Desserts, Ice Cream, Soda (diet and regular), Sweet Tea, Candies, Chips, Cookies, Store Bought Juices, Alcohol in Excess of  1-2 drinks a day, Artificial Sweeteners,  Coffee Creamer, and "Sugar-free" Products, Lemonade. This will help patient to have more stable blood glucose profile and potentially avoid unintended weight gain.    - he will be scheduled with Jearld Fenton, RDN, CDE for diabetes education.  - I have approached him with the following individualized plan to manage  his diabetes and patient agrees:   -In light of his chronic glycemic burden, he will need MDI in order for him to achieve and maintain control of diabetes to target. - For this to happen , he has to engage for proper  monitoring of blood glucose for safe use of insulin. - Accordingly , he is  approached to start  strict monitoring of glucose 4 times a day-before meals and at bedtime and return in 1 week with his meter and logs. -In the meantime, he is advised to increase his [place] to 70 units nightly.  - he is warned not to take insulin without proper monitoring per orders.  - he is encouraged to call clinic for blood glucose levels less than 70 or above 300 mg /dl. -he will be considered for prandial insulin next visit, he will also benefit from CGM. - he is not a suitable candidate for SGLT2 I, incretin  Therapy. - he will need work up to characterize his diabetes properly.  - Specific targets for  A1c;  LDL, HDL,  and Triglycerides were discussed with the patient.  2) Blood Pressure /Hypertension:   he is advised to home monitor blood pressure and report if > 140/90 on 2 separate readings.  3) Lipids/Hyperlipidemia:   Review of his recent lipid panel showed  controlled  LDL at 95 .  he  is advised to resume and continue Crestor 20 mg daily at bedtime.  Side effects and precautions discussed with him.  4)  Weight/Diet:  There is no height or weight on file to calculate BMI.  -   he is not a candidate for major  weight loss. I discussed with him the fact that loss of 5 - 10% of his  current body weight will have the most impact on his diabetes management.  Exercise, and detailed carbohydrates information  provided  -  detailed on discharge instructions.  5) Chronic Care/Health Maintenance:  -he  is on Statin medications and  is encouraged to initiate and continue to follow up with Ophthalmology, Dentist,  Podiatrist at least yearly or according to recommendations, and advised to  quit smoking and ETOH. I have recommended yearly flu vaccine and pneumonia vaccine at least every 5 years; moderate intensity exercise for up to 150 minutes weekly; and  sleep for at least 7 hours a day.  - he is  advised to  maintain close follow up with [facility], Modena Nunnery, MD for primary care needs, as well as his other providers for optimal and coordinated care.   - Time spent on this patient care encounter:  35 min, of which > 50% was spent in  counseling and the rest reviewing his blood glucose logs , discussing his hypoglycemia and hyperglycemia episodes, reviewing his current and  previous labs / studies  ( including abstraction from other facilities) and medications  doses and developing a  long term treatment plan and documenting his care.   Please refer to Patient Instructions for Blood Glucose Monitoring and Insulin/Medications Dosing Guide"  in media tab for additional information. Please  also refer to " Patient Self Inventory" in the Media  tab for reviewed elements of pertinent patient history.  Joshua Escobar participated in the discussions, expressed understanding, and voiced agreement with the above plans.  All questions were answered to his satisfaction. he is encouraged to contact clinic should he have any questions or concerns prior to his return visit.    Follow up plan: - Return in about 8 weeks (around 08/09/2020) for Bring Meter and Logs- A1c in Office.  Glade Lloyd, MD [facility] [address] [place], [place] [address] Phone: [phone]  Fax: [phone]    06/14/2020, 3:31 PM  This note was partially dictated with voice recognition software. Similar sounding words can be transcribed inadequately or may not  be corrected upon review.

## 2020-06-15 ENCOUNTER — Telehealth: Payer: BC Managed Care – PPO | Admitting: Nutrition

## 2020-06-15 ENCOUNTER — Telehealth: Payer: Self-pay | Admitting: Nutrition

## 2020-06-15 NOTE — Telephone Encounter (Signed)
VM left to remind pt of his appointment via MYchart.

## 2020-06-17 ENCOUNTER — Telehealth: Payer: Self-pay

## 2020-06-17 NOTE — Telephone Encounter (Signed)
Patient is saying that he got a email that he does not qualify for the dexcom. He said it came from [employer]. Please advise

## 2020-06-17 NOTE — Telephone Encounter (Signed)
Pt called back I informed him of message below. Pt states he was told they contacted his doctor and after speaking with them he was denied. Pt is going to call [employer] who he received information from.

## 2020-06-17 NOTE — Telephone Encounter (Signed)
Patient called back and was explained that he has to be compliant with testing as ordered QID and injecting insulin TID or a pump to meet the criteria for insurance , patient is currently taking 1 injection daily, advised things may change at follow up visits. Has to bring in bg logs to each visit. Patient verbalized understanding.

## 2020-06-17 NOTE — Telephone Encounter (Signed)
noted 

## 2020-06-17 NOTE — Telephone Encounter (Signed)
Returned call to patient, no answer, can not leave a VM as per DPR. Patient will need to contact them to find out why he was denied.

## 2020-06-21 ENCOUNTER — Ambulatory Visit: Payer: BC Managed Care – PPO | Admitting: Urology

## 2020-06-27 ENCOUNTER — Other Ambulatory Visit: Payer: Self-pay

## 2020-06-27 ENCOUNTER — Emergency Department (HOSPITAL_COMMUNITY)
Admission: EM | Admit: 2020-06-27 | Discharge: 2020-06-27 | Disposition: A | Discharge status: Home / Self Care | Payer: BC Managed Care – PPO | Attending: Emergency Medicine | Admitting: Emergency Medicine

## 2020-06-27 ENCOUNTER — Emergency Department (HOSPITAL_COMMUNITY): Payer: BC Managed Care – PPO

## 2020-06-27 ENCOUNTER — Encounter (HOSPITAL_COMMUNITY): Payer: Self-pay | Admitting: Emergency Medicine

## 2020-06-27 DIAGNOSIS — E1165 Type 2 diabetes mellitus with hyperglycemia: Secondary | ICD-10-CM | POA: Diagnosis not present

## 2020-06-27 DIAGNOSIS — F17210 Nicotine dependence, cigarettes, uncomplicated: Secondary | ICD-10-CM | POA: Diagnosis not present

## 2020-06-27 DIAGNOSIS — R079 Chest pain, unspecified: Secondary | ICD-10-CM | POA: Diagnosis not present

## 2020-06-27 DIAGNOSIS — I252 Old myocardial infarction: Secondary | ICD-10-CM | POA: Diagnosis not present

## 2020-06-27 DIAGNOSIS — Z7982 Long term (current) use of aspirin: Secondary | ICD-10-CM | POA: Diagnosis not present

## 2020-06-27 DIAGNOSIS — Z794 Long term (current) use of insulin: Secondary | ICD-10-CM | POA: Diagnosis not present

## 2020-06-27 DIAGNOSIS — Y92410 Unspecified street and highway as the place of occurrence of the external cause: Secondary | ICD-10-CM | POA: Diagnosis not present

## 2020-06-27 DIAGNOSIS — E1140 Type 2 diabetes mellitus with diabetic neuropathy, unspecified: Secondary | ICD-10-CM | POA: Insufficient documentation

## 2020-06-27 DIAGNOSIS — R0789 Other chest pain: Secondary | ICD-10-CM

## 2020-06-27 DIAGNOSIS — J189 Pneumonia, unspecified organism: Secondary | ICD-10-CM | POA: Diagnosis not present

## 2020-06-27 DIAGNOSIS — Z955 Presence of coronary angioplasty implant and graft: Secondary | ICD-10-CM | POA: Insufficient documentation

## 2020-06-27 DIAGNOSIS — E1110 Type 2 diabetes mellitus with ketoacidosis without coma: Secondary | ICD-10-CM | POA: Insufficient documentation

## 2020-06-27 DIAGNOSIS — M545 Low back pain, unspecified: Secondary | ICD-10-CM

## 2020-06-27 LAB — CBC
HCT: 47.6 % (ref 39.0–52.0)
Hemoglobin: 14.6 g/dL (ref 13.0–17.0)
MCH: 25.6 pg — ABNORMAL LOW (ref 26.0–34.0)
MCHC: 30.7 g/dL (ref 30.0–36.0)
MCV: 83.4 fL (ref 80.0–100.0)
Platelets: 261 10*3/uL (ref 150–400)
RBC: 5.71 MIL/uL (ref 4.22–5.81)
RDW: 13.8 % (ref 11.5–15.5)
WBC: 5.7 10*3/uL (ref 4.0–10.5)
nRBC: 0 % (ref 0.0–0.2)

## 2020-06-27 LAB — BASIC METABOLIC PANEL
Anion gap: 8 (ref 5–15)
BUN: 15 mg/dL (ref 6–20)
CO2: 26 mmol/L (ref 22–32)
Calcium: 8.8 mg/dL — ABNORMAL LOW (ref 8.9–10.3)
Chloride: 101 mmol/L (ref 98–111)
Creatinine, Ser: 0.78 mg/dL (ref 0.61–1.24)
GFR, Estimated: >60 mL/min (ref >60)
Glucose, Bld: 317 mg/dL — ABNORMAL HIGH (ref 70–99)
Potassium: 4 mmol/L (ref 3.5–5.1)
Sodium: 135 mmol/L (ref 135–145)

## 2020-06-27 LAB — TROPONIN I (HIGH SENSITIVITY): Troponin I (High Sensitivity): <2 ng/L (ref <18)

## 2020-06-27 LAB — CBG MONITORING, ED: Glucose-Capillary: 305 mg/dL — ABNORMAL HIGH (ref 70–99)

## 2020-06-27 MED ORDER — CYCLOBENZAPRINE HCL 10 MG PO TABS: 10.0000 mg | ORAL_TABLET | Freq: Two times a day (BID) | ORAL | 0 refills | Status: DC | PRN
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
## 2020-06-28 ENCOUNTER — Telehealth: Payer: Self-pay | Admitting: *Deleted

## 2020-06-28 NOTE — Telephone Encounter (Signed)
Transition Care Management Follow-up Telephone Call  Date of discharge and from where: 06/27/2020 - [facility]  How have you been since you were released from the hospital? "Still in a lot of pain."  Any questions or concerns? No  Items Reviewed:  Did the pt receive and understand the discharge instructions provided? Yes   Medications obtained and verified? Yes   Other? No   Any new allergies since your discharge? No   Dietary orders reviewed? Yes  Do you have support at home? Yes   Home Care and Equipment/Supplies: Were home health services ordered? not applicable If so, what is the name of the agency? N/A  Has the agency set up a time to come to the patient's home? not applicable Were any new equipment or medical supplies ordered?  No What is the name of the medical supply agency? N/A Were you able to get the supplies/equipment? not applicable Do you have any questions related to the use of the equipment or supplies? No  Functional Questionnaire: (I = Independent and D = Dependent) ADLs: I  Bathing/Dressing- I  Meal Prep- I  Eating- I  Maintaining continence- I  Transferring/Ambulation- I  Managing Meds- I  Follow up appointments reviewed:   PCP Hospital f/u appt confirmed? No   - Patient stated that he was going to call today to make an appointment.  Specialist Hospital f/u appt confirmed? No    Are transportation arrangements needed? No   If their condition worsens, is the pt aware to call PCP or go to the Emergency Dept.? Yes  Was the patient provided with contact information for the PCP's office or ED? Yes  Was to pt encouraged to call back with questions or concerns? Yes

## 2020-06-28 NOTE — Telephone Encounter (Signed)
Transition Care Management Unsuccessful Follow-up Telephone Call  Date of discharge and from where:  06/27/2020 - [facility]  Attempts:  1st Attempt  Reason for unsuccessful TCM follow-up call:  Left voice message 

## 2020-06-29 ENCOUNTER — Other Ambulatory Visit: Payer: Self-pay | Admitting: Family Medicine

## 2020-06-29 ENCOUNTER — Telehealth: Payer: Self-pay

## 2020-06-29 ENCOUNTER — Encounter: Payer: Self-pay | Admitting: Family Medicine

## 2020-06-29 NOTE — Telephone Encounter (Signed)
Please schedule patient for appointment.

## 2020-06-29 NOTE — Telephone Encounter (Signed)
Pt needs OV, he can see Shanda Bumps
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
## 2020-06-30 ENCOUNTER — Ambulatory Visit: Payer: Self-pay | Admitting: Nurse Practitioner
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
## 2020-06-30 NOTE — Patient Instructions (Incomplete)

## 2020-06-30 NOTE — Telephone Encounter (Signed)
Ok to refill??  Last office visit 11/19/2019.  Last refill 05/16/2019.

## 2020-07-01 ENCOUNTER — Telehealth: Payer: Self-pay | Admitting: Family Medicine
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
## 2020-07-02 ENCOUNTER — Ambulatory Visit: Payer: Self-pay | Admitting: Nurse Practitioner

## 2020-07-05 ENCOUNTER — Encounter: Payer: Self-pay | Admitting: Nurse Practitioner

## 2020-07-05 ENCOUNTER — Ambulatory Visit (INDEPENDENT_AMBULATORY_CARE_PROVIDER_SITE_OTHER): Payer: BC Managed Care – PPO | Admitting: Nurse Practitioner

## 2020-07-05 ENCOUNTER — Other Ambulatory Visit: Payer: Self-pay

## 2020-07-05 VITALS — BP 120/82 | HR 97 | Temp 97.2°F | Ht 68.0 in | Wt 172.0 lb

## 2020-07-05 DIAGNOSIS — R0781 Pleurodynia: Secondary | ICD-10-CM | POA: Diagnosis not present

## 2020-07-05 DIAGNOSIS — M5442 Lumbago with sciatica, left side: Secondary | ICD-10-CM

## 2020-07-05 MED ORDER — HYDROCODONE-ACETAMINOPHEN 7.5-325 MG PO TABS: 1.0000 | ORAL_TABLET | Freq: Four times a day (QID) | ORAL | 0 refills | Status: AC | PRN

## 2020-07-05 NOTE — Patient Instructions (Addendum)
F/u in about 1 week for back pain - virtually.  Back Exercises These exercises help to make your trunk and back strong. They also help to keep the lower back flexible. Doing these exercises can help to prevent back pain or lessen existing pain.  If you have back pain, try to do these exercises 2-3 times each day or as told by your doctor.  As you get better, do the exercises once each day. Repeat the exercises more often as told by your doctor.  To stop back pain from coming back, do the exercises once each day, or as told by your doctor. Exercises Single knee to chest Do these steps 3-5 times in a row for each leg: 1. Lie on your back on a firm bed or the floor with your legs stretched out. 2. Bring one knee to your chest. 3. Grab your knee or thigh with both hands and hold them it in place. 4. Pull on your knee until you feel a gentle stretch in your lower back or buttocks. 5. Keep doing the stretch for 10-30 seconds. 6. Slowly let go of your leg and straighten it. Pelvic tilt Do these steps 5-10 times in a row: 1. Lie on your back on a firm bed or the floor with your legs stretched out. 2. Bend your knees so they point up to the ceiling. Your feet should be flat on the floor. 3. Tighten your lower belly (abdomen) muscles to press your lower back against the floor. This will make your tailbone point up to the ceiling instead of pointing down to your feet or the floor. 4. Stay in this position for 5-10 seconds while you gently tighten your muscles and breathe evenly. Cat-cow Do these steps until your lower back bends more easily: 1. Get on your hands and knees on a firm surface. Keep your hands under your shoulders, and keep your knees under your hips. You may put padding under your knees. 2. Let your head hang down toward your chest. Tighten (contract) the muscles in your belly. Point your tailbone toward the floor so your lower back becomes rounded like the back of a cat. 3. Stay in  this position for 5 seconds. 4. Slowly lift your head. Let the muscles of your belly relax. Point your tailbone up toward the ceiling so your back forms a sagging arch like the back of a cow. 5. Stay in this position for 5 seconds.   Press-ups Do these steps 5-10 times in a row: 1. Lie on your belly (face-down) on the floor. 2. Place your hands near your head, about shoulder-width apart. 3. While you keep your back relaxed and keep your hips on the floor, slowly straighten your arms to raise the top half of your body and lift your shoulders. Do not use your back muscles. You may change where you place your hands in order to make yourself more comfortable. 4. Stay in this position for 5 seconds. 5. Slowly return to lying flat on the floor.   Bridges Do these steps 10 times in a row: 1. Lie on your back on a firm surface. 2. Bend your knees so they point up to the ceiling. Your feet should be flat on the floor. Your arms should be flat at your sides, next to your body. 3. Tighten your butt muscles and lift your butt off the floor until your waist is almost as high as your knees. If you do not feel the muscles working in  your butt and the back of your thighs, slide your feet 1-2 inches farther away from your butt. 4. Stay in this position for 3-5 seconds. 5. Slowly lower your butt to the floor, and let your butt muscles relax. If this exercise is too easy, try doing it with your arms crossed over your chest.   Belly crunches Do these steps 5-10 times in a row: 1. Lie on your back on a firm bed or the floor with your legs stretched out. 2. Bend your knees so they point up to the ceiling. Your feet should be flat on the floor. 3. Cross your arms over your chest. 4. Tip your chin a little bit toward your chest but do not bend your neck. 5. Tighten your belly muscles and slowly raise your chest just enough to lift your shoulder blades a tiny bit off of the floor. Avoid raising your body higher than  that, because it can put too much stress on your low back. 6. Slowly lower your chest and your head to the floor. Back lifts Do these steps 5-10 times in a row: 1. Lie on your belly (face-down) with your arms at your sides, and rest your forehead on the floor. 2. Tighten the muscles in your legs and your butt. 3. Slowly lift your chest off of the floor while you keep your hips on the floor. Keep the back of your head in line with the curve in your back. Look at the floor while you do this. 4. Stay in this position for 3-5 seconds. 5. Slowly lower your chest and your face to the floor. Contact a doctor if:  Your back pain gets a lot worse when you do an exercise.  Your back pain does not get better 2 hours after you exercise. If you have any of these problems, stop doing the exercises. Do not do them again unless your doctor says it is okay. Get help right away if:  You have sudden, very bad back pain. If this happens, stop doing the exercises. Do not do them again unless your doctor says it is okay. This information is not intended to replace advice given to you by your health care provider. Make sure you discuss any questions you have with your health care provider. Document Revised: 02/07/2018 Document Reviewed: 02/07/2018 Elsevier Patient Education  2021 [employer].
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
## 2020-07-06 ENCOUNTER — Other Ambulatory Visit: Payer: Self-pay

## 2020-07-06 ENCOUNTER — Encounter (HOSPITAL_COMMUNITY): Payer: Self-pay | Admitting: Emergency Medicine

## 2020-07-06 ENCOUNTER — Emergency Department (HOSPITAL_COMMUNITY)
Admission: EM | Admit: 2020-07-06 | Discharge: 2020-07-06 | Disposition: A | Discharge status: Home / Self Care | Payer: BC Managed Care – PPO | Attending: Emergency Medicine | Admitting: Emergency Medicine

## 2020-07-06 DIAGNOSIS — Z5321 Procedure and treatment not carried out due to patient leaving prior to being seen by health care provider: Secondary | ICD-10-CM | POA: Diagnosis not present

## 2020-07-06 DIAGNOSIS — R109 Unspecified abdominal pain: Secondary | ICD-10-CM | POA: Insufficient documentation

## 2020-07-06 DIAGNOSIS — M549 Dorsalgia, unspecified: Secondary | ICD-10-CM | POA: Insufficient documentation

## 2020-07-06 DIAGNOSIS — Y92410 Unspecified street and highway as the place of occurrence of the external cause: Secondary | ICD-10-CM | POA: Diagnosis not present
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
## 2020-07-06 NOTE — ED Notes (Signed)
Pt stated he no longer wanted to wait and would just either come back tomorrow or call his PCP. Pt ambulated off unit without difficulty and appeared to be in NAD.

## 2020-07-06 NOTE — ED Provider Notes (Signed)
He left before evaluation.   Mancel Bale, MD 07/06/20 2151

## 2020-07-08 ENCOUNTER — Telehealth: Payer: Self-pay | Admitting: *Deleted

## 2020-07-08 NOTE — Telephone Encounter (Signed)
He is okay to return to work as long as he is not requiring muscle relaxant/narcotic during work hours.

## 2020-07-08 NOTE — Telephone Encounter (Signed)
Received call from patient.   Reports that he requires a return to work note.   Please advise.

## 2020-07-08 NOTE — Telephone Encounter (Signed)
Patient is requesting excuse from 06/26/2020- 07/09/2020 with a return to work date of 07/13/2019.  Also states that he is submitting FMLA and will have faxed to office.   Ok to write note?

## 2020-07-08 NOTE — Telephone Encounter (Signed)
Of note, patient states that he did have X-rays performed at [facility].

## 2020-07-09 ENCOUNTER — Telehealth: Payer: Self-pay | Admitting: Nurse Practitioner

## 2020-07-09 NOTE — Telephone Encounter (Signed)
Call for xray results.

## 2020-07-09 NOTE — Telephone Encounter (Signed)
I have reviewed the imaging; both x-rays were negative for acute process or fracture.  Okay to write note.

## 2020-07-09 NOTE — Telephone Encounter (Signed)
Called pt, lvm for call bk

## 2020-07-12 NOTE — Telephone Encounter (Signed)
Letter transcribed.

## 2020-07-13 NOTE — Telephone Encounter (Signed)
Received call from patient.   States that he was waiting on return to work note, but had not received it at this time. States that he did not return to work on 07/12/2020 as he did not have note.   States that he is now out of town and will not be back in town for 3pm shift.   Advised that note can be extend for Monday, 07/12/2020, but it was patient prerogative to leave town today prior to shift.

## 2020-07-14 ENCOUNTER — Ambulatory Visit (INDEPENDENT_AMBULATORY_CARE_PROVIDER_SITE_OTHER): Payer: BC Managed Care – PPO | Admitting: Gastroenterology

## 2020-07-14 ENCOUNTER — Telehealth (INDEPENDENT_AMBULATORY_CARE_PROVIDER_SITE_OTHER): Payer: Self-pay

## 2020-07-14 ENCOUNTER — Encounter (INDEPENDENT_AMBULATORY_CARE_PROVIDER_SITE_OTHER): Payer: Self-pay | Admitting: Gastroenterology

## 2020-07-14 NOTE — Telephone Encounter (Signed)
noted 

## 2020-07-14 NOTE — Telephone Encounter (Signed)
Patient no showed for his appointment today with Dr. [name] at [facility].

## 2020-07-19 ENCOUNTER — Encounter: Payer: Self-pay | Admitting: Nurse Practitioner

## 2020-07-19 NOTE — Telephone Encounter (Signed)
Please schedule appointment for refills.  Back pain should be improving and I would like to evaluate him in-person.

## 2020-07-23 ENCOUNTER — Telehealth: Payer: Self-pay

## 2020-07-23 NOTE — Telephone Encounter (Signed)
FMLA forms faxed to the [employer] [phone] for pt. Pt is aware

## 2020-08-09 ENCOUNTER — Ambulatory Visit: Payer: BC Managed Care – PPO | Admitting: Urology

## 2020-08-09 DIAGNOSIS — N529 Male erectile dysfunction, unspecified: Secondary | ICD-10-CM

## 2020-08-10 ENCOUNTER — Ambulatory Visit: Payer: BC Managed Care – PPO | Admitting: "Endocrinology

## 2020-08-12 ENCOUNTER — Telehealth (INDEPENDENT_AMBULATORY_CARE_PROVIDER_SITE_OTHER): Payer: Self-pay

## 2020-08-12 ENCOUNTER — Encounter (INDEPENDENT_AMBULATORY_CARE_PROVIDER_SITE_OTHER): Payer: Self-pay

## 2020-08-12 ENCOUNTER — Encounter (INDEPENDENT_AMBULATORY_CARE_PROVIDER_SITE_OTHER): Payer: Self-pay | Admitting: Gastroenterology

## 2020-08-12 ENCOUNTER — Ambulatory Visit (INDEPENDENT_AMBULATORY_CARE_PROVIDER_SITE_OTHER): Payer: BC Managed Care – PPO | Admitting: Gastroenterology

## 2020-08-12 NOTE — Telephone Encounter (Signed)
Patient no showed for appointment with Dr. [name] at [facility].

## 2020-08-12 NOTE — Telephone Encounter (Signed)
noted 

## 2020-08-18 ENCOUNTER — Ambulatory Visit: Payer: BC Managed Care – PPO | Admitting: "Endocrinology

## 2020-09-09 ENCOUNTER — Other Ambulatory Visit: Payer: Self-pay

## 2020-09-09 ENCOUNTER — Other Ambulatory Visit: Payer: Self-pay | Admitting: "Endocrinology

## 2020-09-09 ENCOUNTER — Encounter (HOSPITAL_COMMUNITY): Payer: Self-pay

## 2020-09-09 ENCOUNTER — Inpatient Hospital Stay (HOSPITAL_COMMUNITY): Payer: BC Managed Care – PPO

## 2020-09-09 ENCOUNTER — Observation Stay (HOSPITAL_COMMUNITY)
Admission: EM | Admit: 2020-09-09 | Discharge: 2020-09-11 | Disposition: A | Discharge status: Home / Self Care | Payer: BC Managed Care – PPO | Attending: Family Medicine | Admitting: Family Medicine

## 2020-09-09 DIAGNOSIS — K625 Hemorrhage of anus and rectum: Secondary | ICD-10-CM | POA: Insufficient documentation

## 2020-09-09 DIAGNOSIS — E876 Hypokalemia: Secondary | ICD-10-CM | POA: Insufficient documentation

## 2020-09-09 DIAGNOSIS — Z20822 Contact with and (suspected) exposure to covid-19: Secondary | ICD-10-CM | POA: Insufficient documentation

## 2020-09-09 DIAGNOSIS — D696 Thrombocytopenia, unspecified: Secondary | ICD-10-CM

## 2020-09-09 DIAGNOSIS — E1165 Type 2 diabetes mellitus with hyperglycemia: Secondary | ICD-10-CM | POA: Diagnosis not present

## 2020-09-09 DIAGNOSIS — K648 Other hemorrhoids: Secondary | ICD-10-CM | POA: Diagnosis not present

## 2020-09-09 DIAGNOSIS — E1149 Type 2 diabetes mellitus with other diabetic neurological complication: Secondary | ICD-10-CM

## 2020-09-09 DIAGNOSIS — E1140 Type 2 diabetes mellitus with diabetic neuropathy, unspecified: Secondary | ICD-10-CM | POA: Insufficient documentation

## 2020-09-09 DIAGNOSIS — R110 Nausea: Secondary | ICD-10-CM | POA: Diagnosis not present

## 2020-09-09 DIAGNOSIS — D649 Anemia, unspecified: Secondary | ICD-10-CM | POA: Diagnosis not present

## 2020-09-09 DIAGNOSIS — Z720 Tobacco use: Secondary | ICD-10-CM | POA: Diagnosis present

## 2020-09-09 DIAGNOSIS — R109 Unspecified abdominal pain: Secondary | ICD-10-CM | POA: Diagnosis not present

## 2020-09-09 DIAGNOSIS — D509 Iron deficiency anemia, unspecified: Secondary | ICD-10-CM | POA: Insufficient documentation

## 2020-09-09 DIAGNOSIS — F17200 Nicotine dependence, unspecified, uncomplicated: Secondary | ICD-10-CM | POA: Diagnosis present

## 2020-09-09 DIAGNOSIS — E1065 Type 1 diabetes mellitus with hyperglycemia: Secondary | ICD-10-CM

## 2020-09-09 DIAGNOSIS — E1110 Type 2 diabetes mellitus with ketoacidosis without coma: Secondary | ICD-10-CM | POA: Diagnosis not present

## 2020-09-09 DIAGNOSIS — K589 Irritable bowel syndrome without diarrhea: Secondary | ICD-10-CM | POA: Diagnosis present

## 2020-09-09 DIAGNOSIS — R42 Dizziness and giddiness: Secondary | ICD-10-CM | POA: Diagnosis not present

## 2020-09-09 DIAGNOSIS — E785 Hyperlipidemia, unspecified: Secondary | ICD-10-CM | POA: Diagnosis present

## 2020-09-09 LAB — URINALYSIS, ROUTINE W REFLEX MICROSCOPIC
Bacteria, UA: NONE SEEN
Bilirubin Urine: NEGATIVE
Glucose, UA: >=500 mg/dL — AB
Hgb urine dipstick: NEGATIVE
Ketones, ur: 80 mg/dL — AB
Leukocytes,Ua: NEGATIVE
Nitrite: NEGATIVE
Protein, ur: NEGATIVE mg/dL
Specific Gravity, Urine: 1.03 (ref 1.005–1.030)
pH: 6 (ref 5.0–8.0)

## 2020-09-09 LAB — CBG MONITORING, ED
Glucose-Capillary: 308 mg/dL — ABNORMAL HIGH (ref 70–99)
Glucose-Capillary: 227 mg/dL — ABNORMAL HIGH (ref 70–99)
Glucose-Capillary: 186 mg/dL — ABNORMAL HIGH (ref 70–99)
Glucose-Capillary: 138 mg/dL — ABNORMAL HIGH (ref 70–99)
Glucose-Capillary: 133 mg/dL — ABNORMAL HIGH (ref 70–99)

## 2020-09-09 LAB — IRON AND TIBC
Iron: 31 ug/dL — ABNORMAL LOW (ref 45–182)
Saturation Ratios: 13 % — ABNORMAL LOW (ref 17.9–39.5)
TIBC: 240 ug/dL — ABNORMAL LOW (ref 250–450)
UIBC: 209 ug/dL

## 2020-09-09 LAB — BASIC METABOLIC PANEL
Anion gap: 9 (ref 5–15)
BUN: 8 mg/dL (ref 6–20)
CO2: 26 mmol/L (ref 22–32)
Calcium: 8.7 mg/dL — ABNORMAL LOW (ref 8.9–10.3)
Chloride: 99 mmol/L (ref 98–111)
Creatinine, Ser: 0.58 mg/dL — ABNORMAL LOW (ref 0.61–1.24)
GFR, Estimated: >60 mL/min (ref >60)
Glucose, Bld: 202 mg/dL — ABNORMAL HIGH (ref 70–99)
Potassium: 3.6 mmol/L (ref 3.5–5.1)
Sodium: 134 mmol/L — ABNORMAL LOW (ref 135–145)

## 2020-09-09 LAB — COMPREHENSIVE METABOLIC PANEL
ALT: 17 U/L (ref 0–44)
AST: 13 U/L — ABNORMAL LOW (ref 15–41)
Albumin: 3.8 g/dL (ref 3.5–5.0)
Alkaline Phosphatase: 97 U/L (ref 38–126)
Anion gap: 13 (ref 5–15)
BUN: 11 mg/dL (ref 6–20)
CO2: 24 mmol/L (ref 22–32)
Calcium: 8.8 mg/dL — ABNORMAL LOW (ref 8.9–10.3)
Chloride: 100 mmol/L (ref 98–111)
Creatinine, Ser: 0.65 mg/dL (ref 0.61–1.24)
GFR, Estimated: >60 mL/min (ref >60)
Glucose, Bld: 344 mg/dL — ABNORMAL HIGH (ref 70–99)
Potassium: 3.9 mmol/L (ref 3.5–5.1)
Sodium: 137 mmol/L (ref 135–145)
Total Bilirubin: 0.8 mg/dL (ref 0.3–1.2)
Total Protein: 7.2 g/dL (ref 6.5–8.1)

## 2020-09-09 LAB — LIPASE, BLOOD: Lipase: 33 U/L (ref 11–51)

## 2020-09-09 LAB — CBC WITH DIFFERENTIAL/PLATELET
Abs Immature Granulocytes: 0.02 10*3/uL (ref 0.00–0.07)
Basophils Absolute: 0 10*3/uL (ref 0.0–0.1)
Basophils Relative: 1 %
Eosinophils Absolute: 0 10*3/uL (ref 0.0–0.5)
Eosinophils Relative: 1 %
HCT: 22.5 % — ABNORMAL LOW (ref 39.0–52.0)
Hemoglobin: 6.9 g/dL — CL (ref 13.0–17.0)
Immature Granulocytes: 1 %
Lymphocytes Relative: 28 %
Lymphs Abs: 1.1 10*3/uL (ref 0.7–4.0)
MCH: 26.3 pg (ref 26.0–34.0)
MCHC: 30.7 g/dL (ref 30.0–36.0)
MCV: 85.9 fL (ref 80.0–100.0)
Monocytes Absolute: 0.4 10*3/uL (ref 0.1–1.0)
Monocytes Relative: 9 %
Neutro Abs: 2.4 10*3/uL (ref 1.7–7.7)
Neutrophils Relative %: 60 %
Platelets: 92 10*3/uL — ABNORMAL LOW (ref 150–400)
RBC: 2.62 MIL/uL — ABNORMAL LOW (ref 4.22–5.81)
RDW: 13.5 % (ref 11.5–15.5)
WBC: 3.9 10*3/uL — ABNORMAL LOW (ref 4.0–10.5)
nRBC: 0 % (ref 0.0–0.2)

## 2020-09-09 LAB — C-REACTIVE PROTEIN: CRP: 1.8 mg/dL — ABNORMAL HIGH (ref <1.0)

## 2020-09-09 LAB — FOLATE: Folate: 12.6 ng/mL (ref >5.9)

## 2020-09-09 LAB — RETICULOCYTES
Immature Retic Fract: 13.3 % (ref 2.3–15.9)
RBC.: 5.28 MIL/uL (ref 4.22–5.81)
Retic Count, Absolute: 58.6 10*3/uL (ref 19.0–186.0)
Retic Ct Pct: 1.1 % (ref 0.4–3.1)

## 2020-09-09 LAB — FERRITIN: Ferritin: 381 ng/mL — ABNORMAL HIGH (ref 24–336)

## 2020-09-09 LAB — VITAMIN B12: Vitamin B-12: 1044 pg/mL — ABNORMAL HIGH (ref 180–914)

## 2020-09-09 LAB — GLUCOSE, CAPILLARY
Glucose-Capillary: 142 mg/dL — ABNORMAL HIGH (ref 70–99)
Glucose-Capillary: 147 mg/dL — ABNORMAL HIGH (ref 70–99)

## 2020-09-09 LAB — RESP PANEL BY RT-PCR (FLU A&B, COVID) ARPGX2
Influenza A by PCR: NEGATIVE
Influenza B by PCR: NEGATIVE
SARS Coronavirus 2 by RT PCR: NEGATIVE

## 2020-09-09 LAB — BETA-HYDROXYBUTYRIC ACID: Beta-Hydroxybutyric Acid: 2.53 mmol/L — ABNORMAL HIGH (ref 0.05–0.27)

## 2020-09-09 LAB — PREPARE RBC (CROSSMATCH)

## 2020-09-09 LAB — POC OCCULT BLOOD, ED: Fecal Occult Bld: NEGATIVE

## 2020-09-09 MED ORDER — POTASSIUM CHLORIDE 10 MEQ/100ML IV SOLN
10.0000 meq | INTRAVENOUS | Status: AC
  Administered 2020-09-09 (×2): 10 meq via INTRAVENOUS
  Filled 2020-09-09 (×2): qty 100

## 2020-09-09 MED ORDER — DEXTROSE 50 % IV SOLN: 0.0000 mL | INTRAVENOUS | Status: DC | PRN

## 2020-09-09 MED ORDER — SODIUM CHLORIDE 0.9 % IV BOLUS
1000.0000 mL | Freq: Once | INTRAVENOUS | Status: AC
  Administered 2020-09-09: 1000 mL via INTRAVENOUS

## 2020-09-09 MED ORDER — SODIUM CHLORIDE 0.9% IV SOLUTION: Freq: Once | INTRAVENOUS | Status: AC

## 2020-09-09 MED ORDER — LACTATED RINGERS IV BOLUS
20.0000 mL/kg | Freq: Once | INTRAVENOUS | Status: AC
  Administered 2020-09-09: 1542 mL via INTRAVENOUS

## 2020-09-09 MED ORDER — DEXTROSE IN LACTATED RINGERS 5 % IV SOLN: INTRAVENOUS | Status: DC

## 2020-09-09 MED ORDER — IOHEXOL 300 MG/ML  SOLN
100.0000 mL | Freq: Once | INTRAMUSCULAR | Status: AC | PRN
  Administered 2020-09-09: 100 mL via INTRAVENOUS

## 2020-09-09 MED ORDER — ROSUVASTATIN CALCIUM 20 MG PO TABS
20.0000 mg | ORAL_TABLET | Freq: Every day | ORAL | Status: DC
  Administered 2020-09-10: 20 mg via ORAL
  Filled 2020-09-09: qty 1

## 2020-09-09 MED ORDER — INSULIN REGULAR(HUMAN) IN NACL 100-0.9 UT/100ML-% IV SOLN
INTRAVENOUS | Status: DC
  Administered 2020-09-09: 6.5 [IU]/h via INTRAVENOUS
  Filled 2020-09-09 (×2): qty 100

## 2020-09-09 MED ORDER — LACTATED RINGERS IV SOLN: INTRAVENOUS | Status: DC

## 2020-09-09 NOTE — H&P (Signed)
TRH H&P   Patient Demographics:    Joshua Escobar, is a 50 y.o. male  MRN: [id number]   DOB - September 22, 1970  Admit Date - 09/09/2020  Outpatient Primary MD for the patient is Buelah Manis, Modena Nunnery, MD  Referring MD/NP/PA: Dr Sabra Heck  Patient coming from: Home  Chief Complaint  Patient presents with  . Hyperglycemia      HPI:    Joshua Escobar  is a 50 y.o. male, with history significant for hypertension, hyperlipidemia, diabetes mellitus, migraine, tobacco abuse, chronic back pain, study secondary to generalized weakness and hyperglycemia, is diabetic, and Tresiba 55 units at home, reports he is compliant, he is taking full aspirin daily as well, report for last few days he has been having increased urinary frequency, and thirst, reports his blood glucose over 400 at home, as per report generalized weakness, fatigue, denies chest pain, fever, chills or dyspnea.  As well patient reports intermittent bloating diarrhea, with bright red blood per rectum for last 32-month - in ED patient labs significant for hemoglobin of 6.9, down from baseline of 14.6 couple month ago, platelet count of 92K, from baseline 261k, is Hemoccult negative, blood glucose elevated at 344, normal anion gap, bicarb at 24, but beta hydroxybutyric acid elevated at 2.53, surgery at hospitalist consulted to admit.    Review of systems:    In addition to the HPI above,  No Fever-chills, reports  generalized weakness and fatigue No Headache, No changes with Vision or hearing, No problems swallowing food or Liquids, No Chest pain, Cough or Shortness of Breath, No Abdominal pain, No Nausea or Vommitting, does report intermittent diarrhea with bright red blood per rectum He does report polyuria and polydipsia No dysuria, No new skin rashes or bruises, No new joints pains-aches,  No new weakness, tingling, numbness in any  extremity, No recent weight gain or loss, No polyuria, polydypsia or polyphagia, No significant Mental Stressors.  A full 10 point Review of Systems was done, except as stated above, all other Review of Systems were negative.   With Past History of the following :    Past Medical History:  Diagnosis Date  . MI ([place]ocardial infarction) [place] 10/[place]  . Migraine headache   . Type II diabetes mellitus ([place]       Past Surgical History:  Procedure Laterality Date  . LEFT HEART CATH AND CORONARY ANGIOGRAPHY N/A 11/27/2016   Procedure: Left Heart Cath and Coronary Angiography;  Surgeon: BLorretta Harp MD;  [facility];  Service: Cardiovascular;  Laterality: N/A;      Social History:     Social History   Tobacco Use  . Smoking status: Current Every Day Smoker    Packs/day: 0.50    Years: 20.00    Pack years: 10.00    Types: Cigarettes  . Smokeless tobacco: Never Used  Substance Use Topics  . Alcohol  use: Yes    Alcohol/week: 1.0 standard drink    Types: 1 Cans of beer per week    Comment: weekends only       Family History :     Family History  Problem Relation Age of Onset  . Diabetes Mellitus II Sister   . Diabetes Sister   . Diabetes Mellitus II Brother   . Diabetes Brother   . Diabetes Mellitus II Mother   . Diabetes Mother   . Diabetes Mellitus II Father   . Diabetes Father   . Heart disease Father     Home Medications:   Prior to Admission medications   Medication Sig Start Date End Date Taking? Authorizing Provider  aspirin EC 325 MG tablet Take 325 mg by mouth daily.   Yes [provider]  Blood Glucose Monitoring Suppl (ONETOUCH VERIO) w/Device KIT 1 each by Does not apply route as needed. 05/05/20  Yes Nida, Marella Chimes, MD  cyclobenzaprine (FLEXERIL) 10 MG tablet Take 1 tablet (10 mg total) by mouth 2 (two) times daily as needed for muscle spasms. 06/27/20  Yes Marcello Fennel, PA-C  glucose blood [facility] VERIO)  test strip Use as instructed 05/05/20  Yes Nida, Marella Chimes, MD  rosuvastatin (CRESTOR) 20 MG tablet Take 1 tablet (20 mg total) by mouth daily. 05/05/20  Yes Nida, Marella Chimes, MD  TRESIBA FLEXTOUCH 100 UNIT/ML FlexTouch Pen INJECT70 UNITS INTO THE SKIN AT BEDTIME. Patient taking differently: Inject 70 Units into the skin at bedtime. 09/09/20  Yes Nida, Marella Chimes, MD     Allergies:    No Known Allergies   Physical Exam:   Vitals  Blood pressure (!) 141/97, pulse (!) 51, temperature 98.3 F (36.8 C), temperature source Oral, resp. rate 17, height '5\' 8"'  (1.727 m), weight 77.1 kg, SpO2 96 %.   1. General well developed male, laying in bed, no apparent distress  2. Normal affect and insight, Not Suicidal or Homicidal, Awake Alert, Oriented X 3.  3. No F.N deficits, ALL C.Nerves Intact, Strength 5/5 all 4 extremities, Sensation intact all 4 extremities, Plantars down going.  4. Ears and Eyes appear Normal, Conjunctivae clear, PERRLA. Moist Oral Mucosa.  5. Supple Neck, No JVD, No cervical lymphadenopathy appriciated, No Carotid Bruits.  6. Symmetrical Chest wall movement, Good air movement bilaterally, CTAB.  7. RRR, No Gallops, Rubs or Murmurs, No Parasternal Heave.  8. Positive Bowel Sounds, Abdomen Soft, No tenderness, No organomegaly appriciated,No rebound -guarding or rigidity.  9.  No Cyanosis, Normal Skin Turgor, No Skin Rash or Bruise.  10. Good muscle tone,  joints appear normal , no effusions, Normal ROM.  11. No Palpable Lymph Nodes in Neck or Axillae    Data Review:    CBC Recent Labs  Lab 09/09/20 1631  WBC 3.9*  HGB 6.9*  HCT 22.5*  PLT 92*  MCV 85.9  MCH 26.3  MCHC 30.7  RDW 13.5  LYMPHSABS 1.1  MONOABS 0.4  EOSABS 0.0  BASOSABS 0.0   ------------------------------------------------------------------------------------------------------------------  Chemistries  Recent Labs  Lab 09/09/20 1631  NA 137  K 3.9  CL 100  CO2 24   GLUCOSE 344*  BUN 11  CREATININE 0.65  CALCIUM 8.8*  AST 13*  ALT 17  ALKPHOS 97  BILITOT 0.8   ------------------------------------------------------------------------------------------------------------------ estimated creatinine clearance is 109.3 mL/min (by C-G formula based on SCr of 0.65 mg/dL). ------------------------------------------------------------------------------------------------------------------ No results for input(s): TSH, T4TOTAL, T3FREE, THYROIDAB in the last 72 hours.  Invalid input(s):  FREET3  Coagulation profile No results for input(s): INR, PROTIME in the last 168 hours. ------------------------------------------------------------------------------------------------------------------- No results for input(s): DDIMER in the last 72 hours. -------------------------------------------------------------------------------------------------------------------  Cardiac Enzymes No results for input(s): CKMB, TROPONINI, MYOGLOBIN in the last 168 hours.  Invalid input(s): CK ------------------------------------------------------------------------------------------------------------------    Component Value Date/Time   BNP 4.8 11/25/2016 2026     ---------------------------------------------------------------------------------------------------------------  Urinalysis    Component Value Date/Time   COLORURINE STRAW (A) 09/09/2020 1631   APPEARANCEUR CLEAR 09/09/2020 1631   LABSPEC 1.030 09/09/2020 1631   PHURINE 6.0 09/09/2020 1631   GLUCOSEU >=500 (A) 09/09/2020 1631   HGBUR NEGATIVE 09/09/2020 1631   BILIRUBINUR NEGATIVE 09/09/2020 1631   KETONESUR 80 (A) 09/09/2020 1631   PROTEINUR NEGATIVE 09/09/2020 1631   UROBILINOGEN 0.2 06/10/2017 1607   NITRITE NEGATIVE 09/09/2020 1631   LEUKOCYTESUR NEGATIVE 09/09/2020 1631    ----------------------------------------------------------------------------------------------------------------   Imaging  Results:    No results found.    Assessment & Plan:    Active Problems:   HLD (hyperlipidemia)   DKA (diabetic ketoacidosis) (HCC)   Tobacco use disorder   Type 2 diabetes mellitus with hyperglycemia, without long-term current use of insulin (HCC)   IBS (irritable bowel syndrome)   Symptomatic anemia   Symptomatic anemia -Is most likely due to lower GI bleed, even though he is Hemoccult negative in ED, but he does report his diarrhea with bright red blood per rectum is intermittent. -Plan hemoglobin 14.6, currently 6.9, will transfuse 2 units PRBC. -He does report signs concerning for telemetry bowel disease, will obtain CT abdomen and pelvis, and will consult GI for further recommendations.  Thrombocytopenia -new onset, no clear etiology, will check hepatitis panel and obtain CT abdomen and pelvis which will evaluate the liver.  Diabetes mellitus type 2, insulin-dependent with hyperglycemia -Even though elevated beta hydroxybutyric acid, anion gap and bicarb within normal limit, insulin drip has been started on ED, will continue for next 12 hours, then he will be transitioned to his home regimen of Tresiba 55 units and insulin sliding scale.    DVT Prophylaxis  SCDs  AM Labs Ordered, also please review Full Orders  Family Communication: Admission, patients condition and plan of care including tests being ordered have been discussed with the patient  who indicate understanding and agree with the plan and Code Status.  Code Status Full  Likely DC to  home  Condition GUARDED    Consults called: GI placed in EPIC    Admission status: inpatient    Time spent in minutes : 60 minutes   Phillips Climes M.D on 09/09/2020 at 7:47 PM   [facility] - Office  [phone]

## 2020-09-09 NOTE — ED Provider Notes (Signed)
[facility] Provider Note   CSN: [id number] Arrival date & time: 09/09/20  1610     History Chief Complaint  Patient presents with  . Hyperglycemia    Joshua Escobar is a 50 y.o. male.  HPI   50 year old male, history of diabetes, he is currently on medication including Tresiba flex touch, he also takes rosuvastatin, he also takes aspirin every day.  The patient reports that over the last several days he has had some increasing urinary frequency and thirst, he has had a blood glucose of over 400 and is feeling fairly weak generally fatigued.  He has no fevers chills nausea vomiting or diarrhea.  Paramedics found the patient with a blood sugar that was elevated, gave 500 cc of fluid and brought him to the hospital.  There was no other abnormal vital signs.  Symptoms are persistent, gradually worsening, he states he is eating a diabetic diet and is unsure why this is happening.  Past Medical History:  Diagnosis Date  . MI (myocardial infarction) ([place]) 10/2019  . Migraine headache   . Type II diabetes mellitus [facility])     Patient Active Problem List   Diagnosis Date Noted  . IBS (irritable bowel syndrome) 11/19/2019  . Type 2 diabetes mellitus with hyperglycemia, without long-term current use of insulin ([place]) 01/25/2019  . Erectile dysfunction 06/20/2018  . Tobacco use disorder 08/28/2017  . DKA (diabetic ketoacidoses) 05/15/2017  . Back pain 12/28/2016  . Diabetic neuropathy ([place]) 12/28/2016  . HLD (hyperlipidemia) 10/03/2016    Past Surgical History:  Procedure Laterality Date  . LEFT HEART CATH AND CORONARY ANGIOGRAPHY N/A 11/27/2016   Procedure: Left Heart Cath and Coronary Angiography;  Surgeon: Lorretta Harp, MD;  Location: [facility];  Service: Cardiovascular;  Laterality: N/A;       Family History  Problem Relation Age of Onset  . Diabetes Mellitus II Sister   . Diabetes Sister   . Diabetes Mellitus II Brother   . Diabetes Brother    . Diabetes Mellitus II Mother   . Diabetes Mother   . Diabetes Mellitus II Father   . Diabetes Father   . Heart disease Father     Social History   Tobacco Use  . Smoking status: Current Every Day Smoker    Packs/day: 0.50    Years: 20.00    Pack years: 10.00    Types: Cigarettes  . Smokeless tobacco: Never Used  Vaping Use  . Vaping Use: Every day  . Substances: Nicotine, Flavoring  Substance Use Topics  . Alcohol use: Yes    Alcohol/week: 1.0 standard drink    Types: 1 Cans of beer per week    Comment: weekends only  . Drug use: No    Home Medications Prior to Admission medications   Medication Sig Start Date End Date Taking? Authorizing Provider  aspirin EC 325 MG tablet Take 325 mg by mouth daily.    [provider]  Blood Glucose Monitoring Suppl (ONETOUCH VERIO) w/Device KIT 1 each by Does not apply route as needed. 05/05/20   Cassandria Anger, MD  Continuous Blood Gluc Receiver ([place]) DEVI 1 each by Does not apply route See admin instructions. For continuous glucose monitoring; E11.9 06/10/19   Renato Shin, MD  Continuous Blood Gluc Sensor (DEXCOM G6 SENSOR) MISC 1 each by Does not apply route See admin instructions. For use with continuous glucose monitoring system. Change sensor every 10 days; E11.9 06/10/19  Renato Shin, MD  Continuous Blood Gluc Transmit (DEXCOM G6 TRANSMITTER) MISC 1 each by Does not apply route See admin instructions. For continuous glucose monitoring; E11.9 06/10/19   Renato Shin, MD  cyclobenzaprine (FLEXERIL) 10 MG tablet Take 1 tablet (10 mg total) by mouth 2 (two) times daily as needed for muscle spasms. 06/27/20   Marcello Fennel, PA-C  glucose blood [facility] VERIO) test strip Use as instructed 05/05/20   Cassandria Anger, MD  rosuvastatin (CRESTOR) 20 MG tablet Take 1 tablet (20 mg total) by mouth daily. 05/05/20   Cassandria Anger, MD  TRESIBA FLEXTOUCH 100 UNIT/ML FlexTouch Pen [id number] UNITS  INTO THE SKIN AT BEDTIME. 09/09/20   Nida, Marella Chimes, MD    Allergies    Patient has no known allergies.  Review of Systems   Review of Systems  All other systems reviewed and are negative.   Physical Exam Updated Vital Signs BP (!) 141/97   Pulse (!) 51   Temp 98.3 F (36.8 C) (Oral)   Resp 17   Ht 1.727 m ('5\' 8"' )   Wt 77.1 kg   SpO2 96%   BMI 25.85 kg/m   Physical Exam Vitals and nursing note reviewed.  Constitutional:      General: He is not in acute distress.    Appearance: He is well-developed.  HENT:     Head: Normocephalic and atraumatic.     Mouth/Throat:     Pharynx: No oropharyngeal exudate.  Eyes:     General: No scleral icterus.       Right eye: No discharge.        Left eye: No discharge.     Conjunctiva/sclera: Conjunctivae normal.     Pupils: Pupils are equal, round, and reactive to light.  Neck:     Thyroid: No thyromegaly.     Vascular: No JVD.  Cardiovascular:     Rate and Rhythm: Normal rate and regular rhythm.     Heart sounds: Normal heart sounds. No murmur heard. No friction rub. No gallop.   Pulmonary:     Effort: Pulmonary effort is normal. No respiratory distress.     Breath sounds: Normal breath sounds. No wheezing or rales.  Abdominal:     General: Bowel sounds are normal. There is no distension.     Palpations: Abdomen is soft. There is no mass.     Tenderness: There is no abdominal tenderness.  Musculoskeletal:        General: No tenderness. Normal range of motion.     Cervical back: Normal range of motion and neck supple.  Lymphadenopathy:     Cervical: No cervical adenopathy.  Skin:    General: Skin is warm and dry.     Findings: No erythema or rash.  Neurological:     Mental Status: He is alert.     Coordination: Coordination normal.     Comments: Normal speech gait and coordination, normal strength in all 4 extremities  Psychiatric:        Behavior: Behavior normal.     ED Results / Procedures / Treatments    Labs (all labs ordered are listed, but only abnormal results are displayed) Labs Reviewed  CBC WITH DIFFERENTIAL/PLATELET - Abnormal; Notable for the following components:      Result Value   WBC 3.9 (*)    RBC 2.62 (*)    Hemoglobin 6.9 (*)    HCT 22.5 (*)    Platelets 92 (*)    All  other components within normal limits  COMPREHENSIVE METABOLIC PANEL - Abnormal; Notable for the following components:   Glucose, Bld 344 (*)    Calcium 8.8 (*)    AST 13 (*)    All other components within normal limits  URINALYSIS, ROUTINE W REFLEX MICROSCOPIC - Abnormal; Notable for the following components:   Color, Urine STRAW (*)    Glucose, UA >=500 (*)    Ketones, ur 80 (*)    All other components within normal limits  BETA-HYDROXYBUTYRIC ACID - Abnormal; Notable for the following components:   Beta-Hydroxybutyric Acid 2.53 (*)    All other components within normal limits  CBG MONITORING, ED - Abnormal; Notable for the following components:   Glucose-Capillary 308 (*)    All other components within normal limits  CBG MONITORING, ED - Abnormal; Notable for the following components:   Glucose-Capillary 227 (*)    All other components within normal limits  LIPASE, BLOOD  BASIC METABOLIC PANEL  BASIC METABOLIC PANEL  BASIC METABOLIC PANEL  BASIC METABOLIC PANEL  OCCULT BLOOD X 1 CARD TO LAB, STOOL  POC OCCULT BLOOD, ED  TYPE AND SCREEN  TYPE AND SCREEN  PREPARE RBC (CROSSMATCH)    EKG None  Radiology No results found.  Procedures Procedures   Medications Ordered in ED Medications  insulin regular, human (MYXREDLIN) 100 units/ 100 mL infusion (6.5 Units/hr Intravenous New Bag/Given 09/09/20 1837)  lactated ringers infusion ( Intravenous Not Given 09/09/20 1851)  dextrose 5 % in lactated ringers infusion ( Intravenous New Bag/Given 09/09/20 1847)  dextrose 50 % solution 0-50 mL (has no administration in time range)  potassium chloride 10 mEq in 100 mL IVPB (10 mEq Intravenous New  Bag/Given 09/09/20 1834)  0.9 %  sodium chloride infusion (Manually program via Guardrails IV Fluids) (has no administration in time range)  sodium chloride 0.9 % bolus 1,000 mL (1,000 mLs Intravenous New Bag/Given 09/09/20 1630)  lactated ringers bolus 1,542 mL (1,542 mLs Intravenous New Bag/Given 09/09/20 1833)    ED Course  I have reviewed the triage vital signs and the nursing notes.  Pertinent labs & imaging results that were available during my care of the patient were reviewed by me and considered in my medical decision making (see chart for details).    MDM Rules/Calculators/A&P                          The patient does not appear particularly ill, he does appear to have hyperglycemia, will make sure he does not have DKA, may need fluid and insulin.  The patient's labs are abnormal including a hemoglobin of 6.9, this is very abnormal and it seems that he has lost multiple units of blood over the last several months compared to prior medical chart review.  There is also thrombocytopenia with a platelet count of 92,000 which is also new and unexpected.  Glucose is improving with fluids, some insulin has been given, he does have positive beta hydroxybutyrate suggestive of ketonuria and ketonemia however there is no anion gap acidosis and there is no signs of low carbon oxide or acidosis.  The patient's vital signs remained good, he does report having some bloody stools intermittently and excessive amounts of diarrhea.  Again his abdominal exam is unremarkable.  Rectal exam performed with nurse at the bedside, this was Hemoccult negative but it was only mucoid appearing stool.  CT scan will be ordered, consult with hospitalist, they will admit  Final Clinical Impression(s) / ED Diagnoses Final diagnoses:  Hyperglycemia due to type 1 diabetes mellitus ([place])  Severe anemia  Thrombocytopenia ([place])    Rx / DC Orders ED Discharge Orders    None       Noemi Chapel, MD 09/09/20  Einar Crow

## 2020-09-09 NOTE — ED Triage Notes (Signed)
Patient's fiance drove patient to EMS station due to patient not feeling well with high BG, BG at EMS was 402.

## 2020-09-10 DIAGNOSIS — K625 Hemorrhage of anus and rectum: Secondary | ICD-10-CM

## 2020-09-10 DIAGNOSIS — D649 Anemia, unspecified: Secondary | ICD-10-CM | POA: Diagnosis not present

## 2020-09-10 DIAGNOSIS — F17200 Nicotine dependence, unspecified, uncomplicated: Secondary | ICD-10-CM | POA: Diagnosis not present

## 2020-09-10 DIAGNOSIS — E1110 Type 2 diabetes mellitus with ketoacidosis without coma: Secondary | ICD-10-CM

## 2020-09-10 DIAGNOSIS — E785 Hyperlipidemia, unspecified: Secondary | ICD-10-CM

## 2020-09-10 LAB — GLUCOSE, CAPILLARY
Glucose-Capillary: 124 mg/dL — ABNORMAL HIGH (ref 70–99)
Glucose-Capillary: 143 mg/dL — ABNORMAL HIGH (ref 70–99)
Glucose-Capillary: 189 mg/dL — ABNORMAL HIGH (ref 70–99)
Glucose-Capillary: 190 mg/dL — ABNORMAL HIGH (ref 70–99)
Glucose-Capillary: 214 mg/dL — ABNORMAL HIGH (ref 70–99)
Glucose-Capillary: 235 mg/dL — ABNORMAL HIGH (ref 70–99)
Glucose-Capillary: 278 mg/dL — ABNORMAL HIGH (ref 70–99)
Glucose-Capillary: 97 mg/dL (ref 70–99)

## 2020-09-10 LAB — CBC
HCT: 48 % (ref 39.0–52.0)
Hemoglobin: 15.6 g/dL (ref 13.0–17.0)
MCH: 26.6 pg (ref 26.0–34.0)
MCHC: 32.5 g/dL (ref 30.0–36.0)
MCV: 81.9 fL (ref 80.0–100.0)
Platelets: 195 10*3/uL (ref 150–400)
RBC: 5.86 MIL/uL — ABNORMAL HIGH (ref 4.22–5.81)
RDW: 13.7 % (ref 11.5–15.5)
WBC: 6.9 10*3/uL (ref 4.0–10.5)
nRBC: 0 % (ref 0.0–0.2)

## 2020-09-10 LAB — MAGNESIUM: Magnesium: 1.4 mg/dL — ABNORMAL LOW (ref 1.7–2.4)

## 2020-09-10 LAB — COMPREHENSIVE METABOLIC PANEL
ALT: 17 U/L (ref 0–44)
AST: 18 U/L (ref 15–41)
Albumin: 3.2 g/dL — ABNORMAL LOW (ref 3.5–5.0)
Alkaline Phosphatase: 76 U/L (ref 38–126)
Anion gap: 10 (ref 5–15)
BUN: 6 mg/dL (ref 6–20)
CO2: 26 mmol/L (ref 22–32)
Calcium: 8.7 mg/dL — ABNORMAL LOW (ref 8.9–10.3)
Chloride: 100 mmol/L (ref 98–111)
Creatinine, Ser: 0.5 mg/dL — ABNORMAL LOW (ref 0.61–1.24)
GFR, Estimated: >60 mL/min (ref >60)
Glucose, Bld: 209 mg/dL — ABNORMAL HIGH (ref 70–99)
Potassium: 2.9 mmol/L — ABNORMAL LOW (ref 3.5–5.1)
Sodium: 136 mmol/L (ref 135–145)
Total Bilirubin: 0.8 mg/dL (ref 0.3–1.2)
Total Protein: 5.9 g/dL — ABNORMAL LOW (ref 6.5–8.1)

## 2020-09-10 LAB — MRSA PCR SCREENING: MRSA by PCR: POSITIVE — AB

## 2020-09-10 LAB — HIV ANTIBODY (ROUTINE TESTING W REFLEX): HIV Screen 4th Generation wRfx: NONREACTIVE

## 2020-09-10 LAB — HEPATITIS PANEL, ACUTE
HCV Ab: NONREACTIVE
Hep A IgM: NONREACTIVE
Hep B C IgM: NONREACTIVE
Hepatitis B Surface Ag: NONREACTIVE

## 2020-09-10 LAB — BETA-HYDROXYBUTYRIC ACID: Beta-Hydroxybutyric Acid: 0.63 mmol/L — ABNORMAL HIGH (ref 0.05–0.27)

## 2020-09-10 MED ORDER — INSULIN ASPART 100 UNIT/ML ~~LOC~~ SOLN
0.0000 [IU] | Freq: Every day | SUBCUTANEOUS | Status: DC
  Administered 2020-09-10: 2 [IU] via SUBCUTANEOUS

## 2020-09-10 MED ORDER — POLYETHYLENE GLYCOL 3350 17 GM/SCOOP PO POWD: 1.0000 | Freq: Once | ORAL | Status: DC

## 2020-09-10 MED ORDER — MUPIROCIN 2 % EX OINT
1.0000 "application " | TOPICAL_OINTMENT | Freq: Two times a day (BID) | CUTANEOUS | Status: DC
  Administered 2020-09-10 (×2): 1 via NASAL
  Filled 2020-09-10: qty 22

## 2020-09-10 MED ORDER — INSULIN ASPART 100 UNIT/ML ~~LOC~~ SOLN: 0.0000 [IU] | Freq: Three times a day (TID) | SUBCUTANEOUS | Status: DC

## 2020-09-10 MED ORDER — INSULIN ASPART 100 UNIT/ML ~~LOC~~ SOLN: 0.0000 [IU] | Freq: Every day | SUBCUTANEOUS | Status: DC

## 2020-09-10 MED ORDER — LACTATED RINGERS IV SOLN: INTRAVENOUS | Status: DC

## 2020-09-10 MED ORDER — PEG 3350-KCL-NA BICARB-NACL 420 G PO SOLR
4000.0000 mL | Freq: Once | ORAL | Status: AC
  Administered 2020-09-10: 4000 mL via ORAL

## 2020-09-10 MED ORDER — ACETAMINOPHEN 325 MG PO TABS
650.0000 mg | ORAL_TABLET | Freq: Four times a day (QID) | ORAL | Status: DC | PRN
  Administered 2020-09-10 (×2): 650 mg via ORAL
  Filled 2020-09-10 (×2): qty 2

## 2020-09-10 MED ORDER — INSULIN ASPART 100 UNIT/ML ~~LOC~~ SOLN
12.0000 [IU] | Freq: Three times a day (TID) | SUBCUTANEOUS | Status: DC
  Administered 2020-09-10 (×3): 12 [IU] via SUBCUTANEOUS

## 2020-09-10 MED ORDER — PANTOPRAZOLE SODIUM 40 MG PO TBEC
40.0000 mg | DELAYED_RELEASE_TABLET | Freq: Two times a day (BID) | ORAL | Status: DC
  Administered 2020-09-10 (×2): 40 mg via ORAL
  Filled 2020-09-10 (×2): qty 1

## 2020-09-10 MED ORDER — MAGNESIUM SULFATE 2 GM/50ML IV SOLN
2.0000 g | Freq: Once | INTRAVENOUS | Status: AC
  Administered 2020-09-10: 2 g via INTRAVENOUS
  Filled 2020-09-10: qty 50

## 2020-09-10 MED ORDER — INSULIN ASPART 100 UNIT/ML ~~LOC~~ SOLN
0.0000 [IU] | Freq: Three times a day (TID) | SUBCUTANEOUS | Status: DC
  Administered 2020-09-10: 3 [IU] via SUBCUTANEOUS
  Administered 2020-09-10: 8 [IU] via SUBCUTANEOUS
  Administered 2020-09-11: 2 [IU] via SUBCUTANEOUS

## 2020-09-10 MED ORDER — INSULIN DETEMIR 100 UNIT/ML ~~LOC~~ SOLN
50.0000 [IU] | Freq: Every day | SUBCUTANEOUS | Status: DC
  Administered 2020-09-10: 50 [IU] via SUBCUTANEOUS
  Filled 2020-09-10 (×3): qty 0.5

## 2020-09-10 MED ORDER — CHLORHEXIDINE GLUCONATE CLOTH 2 % EX PADS
6.0000 | MEDICATED_PAD | Freq: Every day | CUTANEOUS | Status: DC
  Administered 2020-09-10 – 2020-09-11 (×2): 6 via TOPICAL

## 2020-09-10 MED ORDER — INSULIN DETEMIR 100 UNIT/ML ~~LOC~~ SOLN
50.0000 [IU] | Freq: Every day | SUBCUTANEOUS | Status: DC
  Administered 2020-09-10: 50 [IU] via SUBCUTANEOUS
  Filled 2020-09-10: qty 0.5

## 2020-09-10 MED ORDER — POTASSIUM CHLORIDE 10 MEQ/100ML IV SOLN
10.0000 meq | INTRAVENOUS | Status: AC
  Administered 2020-09-10 (×4): 10 meq via INTRAVENOUS
  Filled 2020-09-10 (×4): qty 100

## 2020-09-10 NOTE — Progress Notes (Signed)
0145 Notified DO of pt admitted for DKA on insulin gtt. Unable to obtain labs d/t current unit of blood hanging. Recent CBG is 97. DO to put in orders to transition off insulin gtt.  0215 Clarified with DO orders to give 50 units long acting insulin now. Orders entered to begin long acting insulin at 1000. Verbal orders to give long acting insulin now. Continue insulin gtt and dextrose fluids for two hours after giving long acting insulin then stop insulin gtt and recheck CBG. Give sliding scale as needed. Continue to monitor.

## 2020-09-10 NOTE — Progress Notes (Addendum)
PROGRESS NOTE   Joshua Escobar  VEH:[id number] DOB: [birth year] DOA: 09/09/2020 PCP: Salley Scarlet, MD   Chief Complaint  Patient presents with  . Hyperglycemia   Level of care: Med-Surg  Brief Admission History:  50 y.o. male, with history significant for hypertension, hyperlipidemia, diabetes mellitus, migraine, tobacco abuse, chronic back pain, study secondary to generalized weakness and hyperglycemia, is diabetic, and Tresiba 55 units at home, reports he is compliant, he is taking full aspirin daily as well, report for last few days he has been having increased urinary frequency, and thirst, reports his blood glucose over 400 at home, as per report generalized weakness, fatigue, denies chest pain, fever, chills or dyspnea.  As well patient reports intermittent bloating diarrhea, with bright red blood per rectum for last 54-month.  In ED patient labs significant for hemoglobin of 6.9, down from baseline of 14.6 couple month ago, platelet count of 92K, from baseline 261k, is Hemoccult negative, blood glucose elevated at 344, normal anion gap, bicarb at 24, but beta hydroxybutyric acid elevated at 2.53, surgery at hospitalist consulted to admit.  Assessment & Plan:   Active Problems:   HLD (hyperlipidemia)   DKA (diabetic ketoacidosis) (HCC)   Tobacco use disorder   Type 2 diabetes mellitus with hyperglycemia, without long-term current use of insulin (HCC)   IBS (irritable bowel syndrome)   Symptomatic anemia   1. Mild DKA - this has resolved rapidly with IV insulin and now he is transitioned over to subcutaneous insulin and supplemental sliding scale coverage, prandial coverage and CBG monitoring 5x per day.  Titrate doses as needed for better glycemic control.  2. Uncontrolled type 1 diabetes mellitus with neuropathy - A1c pending.  Outpatient follow up with Dr. Fransico Him.   3. IDA symptomatic - s/p 2 units PRBC and Hg back up to 14.  He feels better.  GI consult was requested.  He said  he had only been taking the aspirin for about 1 week.  He was started on protonix for GI protection.   4. Tobacco user - strongly advised to stop especially in the setting of having diabetes mellitus he is HIGH RISK for severe complications.   5. Hyperlipidemia - secondary to poorly controlled diabetes mellitus, will need to control DM before we can have a chance of getting lipids controlled.   6. Thrombocytopenia -likely was reactive, normal result today.  7. Hypokalemia / Hypomagnesemia - IV replacement recheck in AM.   DVT prophylaxis: SCDs early ambulation  Code Status: Full  Family Communication: sig other telephone Disposition: home  Status is: Inpatient  Remains inpatient appropriate because:IV treatments appropriate due to intensity of illness or inability to take PO and Inpatient level of care appropriate due to severity of illness   Dispo: The patient is from: Home              Anticipated d/c is to: Home              Patient currently is not medically stable to d/c.   Difficult to place patient No       Consultants:   GI   Procedures:     Antimicrobials:     Subjective: Pt reports that he is feeling better, no blood in stool or black stool since admission.   Objective: Vitals:   09/10/20 0730 09/10/20 0800 09/10/20 0900 09/10/20 1000  BP:  123/79 116/78 123/79  Pulse:  76 84 87  Resp:  19 17 17   Temp: 97.9 F (  36.6 C)     TempSrc: Oral     SpO2:  100% 100% 99%  Weight:      Height:        Intake/Output Summary (Last 24 hours) at 09/10/2020 1111 Last data filed at 09/10/2020 1024 Gross per 24 hour  Intake 3621.47 ml  Output 1175 ml  Net 2446.47 ml   Filed Weights   09/09/20 1615 09/09/20 2315  Weight: 77.1 kg 75.3 kg    Examination:  General exam: Appears calm and comfortable  Respiratory system: Clear to auscultation. Respiratory effort normal. Cardiovascular system: normal S1 & S2 heard. No JVD, murmurs, rubs, gallops or clicks. No pedal  edema. Gastrointestinal system: Abdomen is nondistended, soft and nontender. No organomegaly or masses felt. Normal bowel sounds heard. Central nervous system: Alert and oriented. No focal neurological deficits. Extremities: Symmetric 5 x 5 power. Skin: No rashes, lesions or ulcers Psychiatry: Judgement and insight appear poor. Mood & affect appropriate.   Data Reviewed: I have personally reviewed following labs and imaging studies  CBC: Recent Labs  Lab 09/09/20 1631 09/10/20 0814  WBC 3.9* 6.9  NEUTROABS 2.4  --   HGB 6.9* 15.6  HCT 22.5* 48.0  MCV 85.9 81.9  PLT 92* 195    Basic Metabolic Panel: Recent Labs  Lab 09/09/20 1631 09/09/20 1757 09/10/20 0510  NA 137 134* 136  K 3.9 3.6 2.9*  CL 100 99 100  CO2 24 26 26   GLUCOSE 344* 202* 209*  BUN 11 8 6   CREATININE 0.65 0.58* 0.50*  CALCIUM 8.8* 8.7* 8.7*  MG  --   --  1.4*    GFR: Estimated Creatinine Clearance: 109.3 mL/min (A) (by C-G formula based on SCr of 0.5 mg/dL (L)).  Liver Function Tests: Recent Labs  Lab 09/09/20 1631 09/10/20 0510  AST 13* 18  ALT 17 17  ALKPHOS 97 76  BILITOT 0.8 0.8  PROT 7.2 5.9*  ALBUMIN 3.8 3.2*    CBG: Recent Labs  Lab 09/10/20 0201 09/10/20 0321 09/10/20 0417 09/10/20 0513 09/10/20 0731  GLUCAP 124* 143* 189* 190* 235*    Recent Results (from the past 240 hour(s))  Resp Panel by RT-PCR (Flu A&B, Covid) Nasopharyngeal Swab     Status: None   Collection Time: 09/09/20  7:34 PM   Specimen: Nasopharyngeal Swab; Nasopharyngeal(NP) swabs in vial transport medium  Result Value Ref Range Status   SARS Coronavirus 2 by RT PCR NEGATIVE NEGATIVE Final    Comment: (NOTE) SARS-CoV-2 target nucleic acids are NOT DETECTED.  The SARS-CoV-2 RNA is generally detectable in upper respiratory specimens during the acute phase of infection. The lowest concentration of SARS-CoV-2 viral copies this assay can detect is 138 copies/mL. A negative result does not preclude  SARS-Cov-2 infection and should not be used as the sole basis for treatment or other patient management decisions. A negative result may occur with  improper specimen collection/handling, submission of specimen other than nasopharyngeal swab, presence of viral mutation(s) within the areas targeted by this assay, and inadequate number of viral copies(<138 copies/mL). A negative result must be combined with clinical observations, patient history, and epidemiological information. The expected result is Negative.  Fact Sheet for Patients:  09/12/20  Fact Sheet for Healthcare Providers:  09/11/20  This test is no t yet approved or cleared by the [employer] FDA and  has been authorized for detection and/or diagnosis of SARS-CoV-2 by FDA under an Emergency Use Authorization (EUA). This EUA will remain  in effect (  meaning this test can be used) for the duration of the COVID-19 declaration under Section 564(b)(1) of the Act, 21 U.S.C.section 360bbb-3(b)(1), unless the authorization is terminated  or revoked sooner.       Influenza A by PCR NEGATIVE NEGATIVE Final   Influenza B by PCR NEGATIVE NEGATIVE Final    Comment: (NOTE) The Xpert Xpress SARS-CoV-2/FLU/RSV plus assay is intended as an aid in the diagnosis of influenza from Nasopharyngeal swab specimens and should not be used as a sole basis for treatment. Nasal washings and aspirates are unacceptable for Xpert Xpress SARS-CoV-2/FLU/RSV testing.  Fact Sheet for Patients: [employer]  Fact Sheet for Healthcare Providers: SeriousBroker.it  This test is not yet approved or cleared by the [place] FDA and has been authorized for detection and/or diagnosis of SARS-CoV-2 by FDA under an Emergency Use Authorization (EUA). This EUA will remain in effect (meaning this test can be used) for the duration of  the COVID-19 declaration under Section 564(b)(1) of the Act, 21 U.S.C. section 360bbb-3(b)(1), unless the authorization is terminated or revoked.  Performed at [facility], [address], [place], [place] [address]   MRSA PCR Screening     Status: Abnormal   Collection Time: 09/09/20 11:05 PM   Specimen: Nasal Mucosa; Nasopharyngeal  Result Value Ref Range Status   MRSA by PCR POSITIVE (A) NEGATIVE Final    Comment:        The GeneXpert MRSA Assay (FDA approved for NASAL specimens only), is one component of a comprehensive MRSA colonization surveillance program. It is not intended to diagnose MRSA infection nor to guide or monitor treatment for MRSA infections. RESULT CALLED TO, READ BACK BY AND VERIFIED WITH: EMILY [email or web address]@[id number]  09/10/20 [facility] Performed at [facility], [address] [place], [place] [address]      Radiology Studies: CT ABDOMEN PELVIS W CONTRAST  Result Date: 09/09/2020 CLINICAL DATA:  Lower left-sided abdominal pain. EXAM: CT ABDOMEN AND PELVIS WITH CONTRAST TECHNIQUE: Multidetector CT imaging of the abdomen and pelvis was performed using the standard protocol following bolus administration of intravenous contrast. CONTRAST:  OMNIPAQUE IOHEXOL 300 MG/ML  SOLN COMPARISON:  December 24, 2016 FINDINGS: Lower chest: No acute abnormality. Hepatobiliary: There is mild diffuse fatty infiltration of the liver parenchyma. No focal liver abnormality is seen. No gallstones, gallbladder wall thickening, or biliary dilatation. Pancreas: Unremarkable. No pancreatic ductal dilatation or surrounding inflammatory changes. Spleen: Normal in size without focal abnormality. Adrenals/Urinary Tract: Adrenal glands are unremarkable. Kidneys are normal, without renal calculi, focal lesion, or hydronephrosis. The urinary bladder is moderately distended and is otherwise unremarkable. Stomach/Bowel: Stomach is within normal limits. Appendix appears normal. No evidence of bowel wall  thickening, distention, or inflammatory changes. Vascular/Lymphatic: Aortic atherosclerosis. No enlarged abdominal or pelvic lymph nodes. Reproductive: Prostate is unremarkable. Other: No abdominal wall hernia or abnormality. No abdominopelvic ascites. Musculoskeletal: No acute or significant osseous findings. IMPRESSION: 1. No acute findings in the abdomen or pelvis. 2. Mild diffuse fatty infiltration of the liver. 3. Aortic atherosclerosis. Aortic Atherosclerosis (ICD10-I70.0). Electronically Signed   By: Aram Candela M.D.   On: 09/09/2020 19:46    Scheduled Meds: . Chlorhexidine Gluconate Cloth  6 each Topical Q0600  . insulin aspart  0-15 Units Subcutaneous TID WC  . insulin aspart  0-5 Units Subcutaneous QHS  . insulin aspart  12 Units Subcutaneous TID WC  . insulin detemir  50 Units Subcutaneous QHS  . mupirocin ointment  1 application Nasal BID  . pantoprazole  40 mg  Oral BID  . rosuvastatin  20 mg Oral Daily   Continuous Infusions: . insulin Stopped (09/10/20 0531)  . lactated ringers Stopped (09/10/20 1023)  . magnesium sulfate bolus IVPB 50 mL/hr at 09/10/20 1024  . potassium chloride 10 mEq (09/10/20 1024)     LOS: 1 day   Time spent: 35 mins   Jahne Krukowski Laural Benes, MD How to contact the [facility] Attending or Consulting provider 7A - 7P or covering provider during after hours 7P -7A, for this patient?  1. Check the care team in [facility] and look for a) attending/consulting TR[facility] provider listed and b) the [facility] team listed 2. Log into [email or web address] and use [place]'s universal password to access. If you do not have the password, please contact the hospital operator. 3. Locate the [facility] provider you are looking for under [facility] and page to a number that you can be directly reached. 4. If you still have difficulty reaching the provider, please page the [facility] (Director on Call) for the Hospitalists listed on amion for assistance.  09/10/2020, 11:11 AM
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
## 2020-09-10 NOTE — Consult Note (Addendum)
Maylon Peppers, M.D. Gastroenterology & Hepatology                                           Patient Name: Joshua Escobar Account #: _0 @   MRN: [id number] Admission Date: 09/09/2020 Date of Evaluation:  09/10/2020 Time of Evaluation: 1:08 PM   Referring Physician: Irwin Brakeman, MD  Chief Complaint:  Rectal bleeding  HPI:  This is a 50 y.o. male with history of diabetes and myocardial infarction, who came to the hospital after presenting symptoms of uncontrolled diabetes.  Patient was found to have severe anemia in the setting of previous episodes of rectal bleeding, for which gastroenterology was consulted.  The patient came to the hospital yesterday after presenting increased urinary frequency and polydipsia.  The patient reported having blood glucose over 400 and decided to come to the ER.  He also reports that he has presented for the last 6 months intermittent episodes of fresh blood mixed with his stool intermittently.  He has not sought any help for this episode of rectal bleeding.  Denies any dyschezia, abdominal pain, fever, chills, abdominal distention, melena or weight loss.  He felt nauseated recently but not when he is the episode started.  On admission to the ED the patient was found to have increased beta hydroxybutyrate but his anion gap was borderline 13.  Was also found to have hemoglobin of 6.9 his admission CBC with an MCV of 85, iron stores were checked and he was found to have mildly decreased iron of 31, saturation of 13%, increased ferritin of 381.  His CRP was 1.8.  Vitamin B 12 was normal.  His reticulocyte count was increased 5.2.  The patient was transferred to the ICU initially was started on an insulin drip with improvement of his glucose.  He was subsequently transferred to the floor today.  He was transfused 1 unit of PRBC with a repeat hemoglobin today of 15.6.  States his last BM today was brown.  Patient has never had an EGD in the past.  She  reports she had a colonoscopy 4 years ago at [facility], no reports available but he reports it was normal.  FHx: neg for any gastrointestinal/liver disease, no malignancies Social: Patient smokes 6 to 7 cigarettes every day, denies alcohol or illicit drug use Surgical: no abdominal surgeries   Past Medical History: SEE CHRONIC ISSSUES: Past Medical History:  Diagnosis Date  . MI (myocardial infarction) ([place]) 10/2019  . Migraine headache   . Type II diabetes mellitus ([place])    Past Surgical History:  Past Surgical History:  Procedure Laterality Date  . LEFT HEART CATH AND CORONARY ANGIOGRAPHY N/A 11/27/2016   Procedure: Left Heart Cath and Coronary Angiography;  Surgeon: Lorretta Harp, MD;  Location: [place] [facility];  Service: Cardiovascular;  Laterality: N/A;   Family History:  Family History  Problem Relation Age of Onset  . Diabetes Mellitus II Sister   . Diabetes Sister   . Diabetes Mellitus II Brother   . Diabetes Brother   . Diabetes Mellitus II Mother   . Diabetes Mother   . Diabetes Mellitus II Father   . Diabetes Father   . Heart disease Father    Social History:  Social History   Tobacco Use  . Smoking status: Current Every Day Smoker    Packs/day: 0.50    Years: 20.00  Pack years: 10.00    Types: Cigarettes  . Smokeless tobacco: Never Used  Vaping Use  . Vaping Use: Every day  . Substances: Nicotine, Flavoring  Substance Use Topics  . Alcohol use: Yes    Alcohol/week: 1.0 standard drink    Types: 1 Cans of beer per week    Comment: weekends only  . Drug use: No    Home Medications:  Prior to Admission medications   Medication Sig Start Date End Date Taking? Authorizing Provider  aspirin EC 325 MG tablet Take 325 mg by mouth daily.   Yes [provider]  Blood Glucose Monitoring Suppl (ONETOUCH VERIO) w/Device KIT 1 each by Does not apply route as needed. 05/05/20  Yes Nida, Marella Chimes, MD  cyclobenzaprine (FLEXERIL) 10 MG tablet  Take 1 tablet (10 mg total) by mouth 2 (two) times daily as needed for muscle spasms. 06/27/20  Yes Marcello Fennel, PA-C  glucose blood [facility] VERIO) test strip Use as instructed 05/05/20  Yes Nida, Marella Chimes, MD  rosuvastatin (CRESTOR) 20 MG tablet Take 1 tablet (20 mg total) by mouth daily. 05/05/20  Yes Nida, Marella Chimes, MD  TRESIBA FLEXTOUCH 100 UNIT/ML FlexTouch Pen INJECT70 UNITS INTO THE SKIN AT BEDTIME. Patient taking differently: Inject 70 Units into the skin at bedtime. 09/09/20  Yes Cassandria Anger, MD    Inpatient Medications:  Current Facility-Administered Medications:  .  acetaminophen (TYLENOL) tablet 650 mg, 650 mg, Oral, Q6H PRN, Zierle-Ghosh, Asia B, DO, 650 mg at 09/10/20 0319 .  Chlorhexidine Gluconate Cloth 2 % PADS 6 each, 6 each, Topical, Q0600, Zierle-Ghosh, Asia B, DO, 6 each at 09/10/20 [phone] .  dextrose 50 % solution 0-50 mL, 0-50 mL, Intravenous, PRN, Elgergawy, Dawood S, MD .  insulin aspart (novoLOG) injection 0-15 Units, 0-15 Units, Subcutaneous, TID WC, Johnson, Clanford L, MD .  insulin aspart (novoLOG) injection 0-5 Units, 0-5 Units, Subcutaneous, QHS, Johnson, Clanford L, MD .  insulin aspart (novoLOG) injection 12 Units, 12 Units, Subcutaneous, TID WC, Johnson, Clanford L, MD, 12 Units at 09/10/20 [phone] .  insulin detemir (LEVEMIR) injection 50 Units, 50 Units, Subcutaneous, QHS, Johnson, Clanford L, MD .  insulin regular, human (MYXREDLIN) 100 units/ 100 mL infusion, , Intravenous, Continuous, Elgergawy, Silver Huguenin, MD, Stopped at 09/10/20 0531 .  lactated ringers infusion, , Intravenous, Continuous, Johnson, Clanford L, MD, Last Rate: 100 mL/hr at 09/10/20 1130, Infusion Verify at 09/10/20 1130 .  mupirocin ointment (BACTROBAN) 2 % 1 application, 1 application, Nasal, BID, Zierle-Ghosh, Asia B, DO, 1 application at 03/22/84 1015 .  pantoprazole (PROTONIX) EC tablet 40 mg, 40 mg, Oral, BID, Johnson, Clanford L, MD, 40 mg at 09/10/20 1015 .   rosuvastatin (CRESTOR) tablet 20 mg, 20 mg, Oral, Daily, Elgergawy, Silver Huguenin, MD, 20 mg at 09/10/20 1015 Allergies: Patient has no known allergies.  Complete Review of Systems: GENERAL: negative for malaise, night sweats HEENT: No changes in hearing or vision, no nose bleeds or other nasal problems. NECK: Negative for lumps, goiter, pain and significant neck swelling RESPIRATORY: Negative for cough, wheezing CARDIOVASCULAR: Negative for chest pain, leg swelling, palpitations, orthopnea GI: SEE HPI MUSCULOSKELETAL: Negative for joint pain or swelling, back pain, and muscle pain. SKIN: Negative for lesions, rash PSYCH: Negative for sleep disturbance, mood disorder and recent psychosocial stressors. HEMATOLOGY Negative for prolonged bleeding, bruising easily, and swollen nodes. ENDOCRINE: Negative for cold or heat intolerance, polyuria, polydipsia and goiter. NEURO: negative for tremor, gait imbalance, syncope and seizures. The remainder  of the review of systems is noncontributory.  Physical Exam: BP 118/85 (BP Location: Right Arm)   Pulse 84   Temp 98.6 F (37 C) (Oral)   Resp 17   Ht _0  (1.727 m)   Wt 75.3 kg   SpO2 100%   BMI 25.24 kg/m  GENERAL: The patient is AO x3, in no acute distress. HEENT: Head is normocephalic and atraumatic. EOMI are intact. Mouth is well hydrated and without lesions. NECK: Supple. No masses LUNGS: Clear to auscultation. No presence of rhonchi/wheezing/rales. Adequate chest expansion HEART: RRR, normal s1 and s2. ABDOMEN: Soft, nontender, no guarding, no peritoneal signs, and nondistended. BS +. No masses. RECTAL EXAM: deferred EXTREMITIES: Without any cyanosis, clubbing, rash, lesions or edema. NEUROLOGIC: AOx3, no focal motor deficit. SKIN: no jaundice, no rashes  Laboratory Data CBC:     Component Value Date/Time   WBC 6.9 09/10/2020 0814   RBC 5.86 (H) 09/10/2020 0814   HGB 15.6 09/10/2020 0814   HCT 48.0 09/10/2020 0814   PLT 195  09/10/2020 0814   MCV 81.9 09/10/2020 0814   MCH 26.6 09/10/2020 0814   MCHC 32.5 09/10/2020 0814   RDW 13.7 09/10/2020 0814   LYMPHSABS 1.1 09/09/2020 1631   MONOABS 0.4 09/09/2020 1631   EOSABS 0.0 09/09/2020 1631   BASOSABS 0.0 09/09/2020 1631   COAG:  Lab Results  Component Value Date   INR 1.11 12/24/2016   INR 1.07 11/26/2016    BMP:  BMP Latest Ref Rng & Units 09/10/2020 09/09/2020 09/09/2020  Glucose 70 - 99 mg/dL 209(H) 202(H) 344(H)  BUN 6 - 20 mg/dL _1 Creatinine 0.61 - 1.24 mg/dL 0.50(L) 0.58(L) 0.65  BUN/Creat Ratio 6 - 22 (calc) - - -  Sodium 135 - 145 mmol/L 136 134(L) 137  Potassium 3.5 - 5.1 mmol/L 2.9(L) 3.6 3.9  Chloride 98 - 111 mmol/L 100 99 100  CO2 22 - 32 mmol/L _2 Calcium 8.9 - 10.3 mg/dL 8.7(L) 8.7(L) 8.8(L)    HEPATIC:  Hepatic Function Latest Ref Rng & Units 09/10/2020 09/09/2020 11/19/2019  Total Protein 6.5 - 8.1 g/dL 5.9(L) 7.2 7.3  Albumin 3.5 - 5.0 g/dL 3.2(L) 3.8 -  AST 15 - 41 U/L 18 13(L) 13  ALT 0 - 44 U/L _3 Alk Phosphatase 38 - 126 U/L 76 97 -  Total Bilirubin 0.3 - 1.2 mg/dL 0.8 0.8 0.6  Bilirubin, Direct 0.1 - 0.5 mg/dL - - -    CARDIAC:  Lab Results  Component Value Date   TROPONINI <0.03 01/30/2017     Imaging: I personally reviewed and interpreted the available imaging.  Assessment & Plan: WINDELL MUSSON is a 50 y.o. male with history of diabetes and myocardial infarction, who came to the hospital after presenting symptoms of uncontrolled diabetes.  Patient was found to have severe anemia in the setting of previous episodes of rectal bleeding, for which gastroenterology was consulted.  Has presented intermittent episodes of rectal bleeding of unclear etiology.  He is not currently taking any NSAIDs for antiplatelet/anticoagulation.  Was found to have significant drop in his hemoglobin which required blood transfusion.  I do believe that the initial value of 6.9 was likely incorrect as he had a major increase in  his repeat hemoglobin with only 1 unit of PRBC.  Will need to proceed with a colonoscopy for evaluation of his rectal bleeding episodes.  I had a thorough discussion with the patient and his wife  agree and understood.  Differential diagnosis on his current presentation is gross, which includes AVMs versus Dieulafoy vs IBD, less likely due to malignancy as he had a recent colonoscopy.  # Lower gastrointestinal bleeding # Severe anemia - Repeat CBC qday, transfuse if Hb <7 - 2 large bore IV lines - Active T/S - Avoid NSAIDs/AC - Will proceed with colonoscopy tomorrow, start bowel prep and liquid diet today at 6 PM, please keep NPO after MN  Harvel Quale, MD Gastroenterology and Hepatology [facility]

## 2020-09-10 NOTE — Progress Notes (Signed)
Report called to RN on Dept. 300. Pt transported via wheelchair.

## 2020-09-11 ENCOUNTER — Encounter (HOSPITAL_COMMUNITY): Payer: Self-pay | Admitting: Family Medicine

## 2020-09-11 ENCOUNTER — Encounter (HOSPITAL_COMMUNITY)
Admission: EM | Disposition: A | Discharge status: Home / Self Care | Payer: Self-pay | Source: Home / Self Care | Attending: Emergency Medicine

## 2020-09-11 ENCOUNTER — Observation Stay (HOSPITAL_COMMUNITY): Payer: BC Managed Care – PPO | Admitting: Anesthesiology

## 2020-09-11 DIAGNOSIS — E1110 Type 2 diabetes mellitus with ketoacidosis without coma: Secondary | ICD-10-CM | POA: Diagnosis not present

## 2020-09-11 DIAGNOSIS — K625 Hemorrhage of anus and rectum: Secondary | ICD-10-CM | POA: Diagnosis not present

## 2020-09-11 DIAGNOSIS — K648 Other hemorrhoids: Secondary | ICD-10-CM

## 2020-09-11 DIAGNOSIS — I2510 Atherosclerotic heart disease of native coronary artery without angina pectoris: Secondary | ICD-10-CM | POA: Diagnosis not present

## 2020-09-11 DIAGNOSIS — D649 Anemia, unspecified: Secondary | ICD-10-CM | POA: Diagnosis not present

## 2020-09-11 DIAGNOSIS — E785 Hyperlipidemia, unspecified: Secondary | ICD-10-CM | POA: Diagnosis not present

## 2020-09-11 DIAGNOSIS — F17200 Nicotine dependence, unspecified, uncomplicated: Secondary | ICD-10-CM | POA: Diagnosis not present

## 2020-09-11 HISTORY — PX: COLONOSCOPY WITH PROPOFOL: SHX5780

## 2020-09-11 LAB — TYPE AND SCREEN
ABO/RH(D): O POS
Antibody Screen: NEGATIVE
Unit division: 0
Unit division: 0

## 2020-09-11 LAB — BPAM RBC
Blood Product Expiration Date: 202205192359
Blood Product Expiration Date: 202205192359
ISSUE DATE / TIME: 202204142227
ISSUE DATE / TIME: 202204150103
Unit Type and Rh: 5100
Unit Type and Rh: 5100

## 2020-09-11 LAB — MAGNESIUM: Magnesium: 1.7 mg/dL (ref 1.7–2.4)

## 2020-09-11 LAB — CBC
HCT: 46.5 % (ref 39.0–52.0)
Hemoglobin: 14.8 g/dL (ref 13.0–17.0)
MCH: 26.4 pg (ref 26.0–34.0)
MCHC: 31.8 g/dL (ref 30.0–36.0)
MCV: 82.9 fL (ref 80.0–100.0)
Platelets: 188 10*3/uL (ref 150–400)
RBC: 5.61 MIL/uL (ref 4.22–5.81)
RDW: 13.5 % (ref 11.5–15.5)
WBC: 5.8 10*3/uL (ref 4.0–10.5)
nRBC: 0 % (ref 0.0–0.2)

## 2020-09-11 LAB — BASIC METABOLIC PANEL
Anion gap: 9 (ref 5–15)
BUN: <5 mg/dL — ABNORMAL LOW (ref 6–20)
CO2: 26 mmol/L (ref 22–32)
Calcium: 8.6 mg/dL — ABNORMAL LOW (ref 8.9–10.3)
Chloride: 103 mmol/L (ref 98–111)
Creatinine, Ser: 0.51 mg/dL — ABNORMAL LOW (ref 0.61–1.24)
GFR, Estimated: >60 mL/min (ref >60)
Glucose, Bld: 197 mg/dL — ABNORMAL HIGH (ref 70–99)
Potassium: 3 mmol/L — ABNORMAL LOW (ref 3.5–5.1)
Sodium: 138 mmol/L (ref 135–145)

## 2020-09-11 LAB — GLUCOSE, CAPILLARY
Glucose-Capillary: 72 mg/dL (ref 70–99)
Glucose-Capillary: 126 mg/dL — ABNORMAL HIGH (ref 70–99)
Glucose-Capillary: 65 mg/dL — ABNORMAL LOW (ref 70–99)
Glucose-Capillary: 102 mg/dL — ABNORMAL HIGH (ref 70–99)
Glucose-Capillary: 71 mg/dL (ref 70–99)
Glucose-Capillary: 77 mg/dL (ref 70–99)

## 2020-09-11 SURGERY — COLONOSCOPY WITH PROPOFOL
Anesthesia: General

## 2020-09-11 MED ORDER — LACTATED RINGERS IV SOLN: INTRAVENOUS | Status: DC

## 2020-09-11 MED ORDER — POTASSIUM CHLORIDE ER 10 MEQ PO TBCR: 10.0000 meq | EXTENDED_RELEASE_TABLET | Freq: Every day | ORAL | 0 refills | Status: DC

## 2020-09-11 MED ORDER — NOVOLOG FLEXPEN 100 UNIT/ML ~~LOC~~ SOPN: 12.0000 [IU] | PEN_INJECTOR | Freq: Three times a day (TID) | SUBCUTANEOUS | 1 refills | Status: DC

## 2020-09-11 MED ORDER — POLYETHYLENE GLYCOL 3350 17 G PO PACK: 17.0000 g | PACK | Freq: Every day | ORAL | 1 refills | Status: AC

## 2020-09-11 MED ORDER — INSULIN PEN NEEDLE 31G X 5 MM MISC: 1.0000 | 2 refills | Status: DC

## 2020-09-11 MED ORDER — STERILE WATER FOR IRRIGATION IR SOLN
Status: DC | PRN
  Administered 2020-09-11: 200 mL

## 2020-09-11 MED ORDER — PROPOFOL 10 MG/ML IV BOLUS
INTRAVENOUS | Status: AC
  Filled 2020-09-11: qty 40

## 2020-09-11 MED ORDER — DEXTROSE 50 % IV SOLN
25.0000 mL | Freq: Once | INTRAVENOUS | Status: AC
  Administered 2020-09-11: 25 mL via INTRAVENOUS

## 2020-09-11 MED ORDER — POLYETHYLENE GLYCOL 3350 17 G PO PACK: 17.0000 g | PACK | Freq: Every day | ORAL | 1 refills | Status: DC

## 2020-09-11 MED ORDER — TRESIBA FLEXTOUCH 100 UNIT/ML ~~LOC~~ SOPN: 50.0000 [IU] | PEN_INJECTOR | Freq: Every day | SUBCUTANEOUS | Status: DC

## 2020-09-11 MED ORDER — DEXTROSE 50 % IV SOLN
INTRAVENOUS | Status: AC
  Filled 2020-09-11: qty 50

## 2020-09-11 MED ORDER — POTASSIUM CHLORIDE 10 MEQ/100ML IV SOLN
10.0000 meq | INTRAVENOUS | Status: DC
  Administered 2020-09-11: 10 meq via INTRAVENOUS
  Filled 2020-09-11: qty 100

## 2020-09-11 MED ORDER — POTASSIUM CHLORIDE 10 MEQ/100ML IV SOLN: 10.0000 meq | INTRAVENOUS | Status: DC

## 2020-09-11 MED ORDER — POTASSIUM CHLORIDE CRYS ER 20 MEQ PO TBCR: 40.0000 meq | EXTENDED_RELEASE_TABLET | Freq: Once | ORAL | Status: DC

## 2020-09-11 MED ORDER — SODIUM CHLORIDE 0.9 % IV SOLN: INTRAVENOUS | Status: DC

## 2020-09-11 MED ORDER — LACTATED RINGERS IV SOLN: INTRAVENOUS | Status: DC | PRN

## 2020-09-11 MED ORDER — PROPOFOL 10 MG/ML IV BOLUS
INTRAVENOUS | Status: DC | PRN
  Administered 2020-09-11: 50 mg via INTRAVENOUS
  Administered 2020-09-11: 200 mg via INTRAVENOUS

## 2020-09-11 MED ORDER — MAGNESIUM SULFATE 4 GM/100ML IV SOLN
4.0000 g | Freq: Once | INTRAVENOUS | Status: AC
  Administered 2020-09-11: 4 g via INTRAVENOUS
  Filled 2020-09-11: qty 100

## 2020-09-11 NOTE — Transfer of Care (Signed)
Immediate Anesthesia Transfer of Care Note  Patient: Joshua Escobar  Procedure(s) Performed: COLONOSCOPY WITH PROPOFOL (N/A )  Patient Location: PACU  Anesthesia Type:General  Level of Consciousness: drowsy  Airway & Oxygen Therapy: Patient Spontanous Breathing and Patient connected to nasal cannula oxygen  Post-op Assessment: Report given to RN and Post -op Vital signs reviewed and stable  Post vital signs: Reviewed and stable  Last Vitals:  Vitals Value Taken Time  BP    Temp    Pulse    Resp    SpO2      Last Pain:  Vitals:   09/11/20 0908  TempSrc: Oral  PainSc: 5          Complications: No complications documented.

## 2020-09-11 NOTE — Anesthesia Postprocedure Evaluation (Signed)
Anesthesia Post Note  Patient: Joshua Escobar  Procedure(s) Performed: COLONOSCOPY WITH PROPOFOL (N/A )  Patient location during evaluation: Phase II Anesthesia Type: General Level of consciousness: awake Pain management: pain level controlled Vital Signs Assessment: post-procedure vital signs reviewed and stable Respiratory status: spontaneous breathing and respiratory function stable Cardiovascular status: blood pressure returned to baseline and stable Postop Assessment: no headache and no apparent nausea or vomiting Anesthetic complications: no   No complications documented.   Last Vitals:  Vitals:   09/11/20 0542 09/11/20 0908  BP: 96/77 110/72  Pulse: 70 67  Resp: 15 15  Temp: 36.4 C 36.7 C  SpO2: 97% 100%    Last Pain:  Vitals:   09/11/20 0908  TempSrc: Oral  PainSc: 5                  Windell Norfolk

## 2020-09-11 NOTE — Progress Notes (Signed)
We will proceed with colonoscopy as scheduled.  I thoroughly discussed with the patient his procedure, including the risks involved. Patient understands what the procedure involves including the benefits and any risks. Patient understands alternatives to the proposed procedure. Risks including (but not limited to) bleeding, tearing of the lining (perforation), rupture of adjacent organs, problems with heart and lung function, infection, and medication reactions. A small percentage of complications may require surgery, hospitalization, repeat endoscopic procedure, and/or transfusion.  Patient understood and agreed.  Deania Siguenza Castaneda, MD Gastroenterology and Hepatology [facility]  

## 2020-09-11 NOTE — Progress Notes (Signed)
Nsg Discharge Note  Admit Date:  09/09/2020 Discharge date: 09/11/2020   [name] to be D/C'd Home per MD order.  AVS completed.  Copy for chart, and copy for patient signed, and dated. Patient/caregiver able to verbalize understanding.  Discharge Medication: Allergies as of 09/11/2020   No Known Allergies     Medication List    TAKE these medications   aspirin EC 325 MG tablet Take 325 mg by mouth daily.   cyclobenzaprine 10 MG tablet Commonly known as: FLEXERIL Take 1 tablet (10 mg total) by mouth 2 (two) times daily as needed for muscle spasms.   Insulin Pen Needle 31G X 5 MM Misc 1 Device by Does not apply route as directed.   NovoLOG FlexPen 100 UNIT/ML FlexPen Generic drug: insulin aspart Inject 12 Units into the skin 3 (three) times daily with meals.   OneTouch Verio test strip Generic drug: glucose blood Use as instructed   OneTouch Verio w/Device Kit 1 each by Does not apply route as needed.   polyethylene glycol 17 g packet Commonly known as: MIRALAX / GLYCOLAX Take 17 g by mouth daily.   potassium chloride 10 MEQ tablet Commonly known as: KLOR-CON Take 1 tablet (10 mEq total) by mouth daily for 5 days.   rosuvastatin 20 MG tablet Commonly known as: CRESTOR Take 1 tablet (20 mg total) by mouth daily.   Tyler Aas FlexTouch 100 UNIT/ML FlexTouch Pen Generic drug: insulin degludec Inject 50 Units into the skin at bedtime. What changed: See the new instructions.       Discharge Assessment: Vitals:   09/11/20 1205 09/11/20 1402  BP: 106/78 105/73  Pulse: 63 77  Resp:  19  Temp: 97.8 F (36.6 C) 97.7 F (36.5 C)  SpO2: 100% 98%   Skin clean, dry and intact without evidence of skin break down, no evidence of skin tears noted. IV catheter discontinued intact. Site without signs and symptoms of complications - no redness or edema noted at insertion site, patient denies c/o pain - only slight tenderness at site.  Dressing with slight pressure  applied.  D/c Instructions-Education: Discharge instructions given to patient/family with verbalized understanding. D/c education completed with patient/family including follow up instructions, medication list, d/c activities limitations if indicated, with other d/c instructions as indicated by MD - patient able to verbalize understanding, all questions fully answered. Patient instructed to return to ED, call 911, or call MD for any changes in condition.  Patient escorted via [place], and D/C home via private auto.  Dorcas Mcmurray, LPN 4/56/2563 8:93 PM

## 2020-09-11 NOTE — Op Note (Addendum)
[facility] Patient Name: Joshua Escobar Procedure Date: 09/11/2020 10:12 AM MRN: [id number] Date of Birth: 1970/09/26 Attending MD: [name] ,  CSN: [id number] Age: 50 Admit Type: Outpatient Procedure:                Colonoscopy Indications:              Rectal bleeding Providers:                [name], Nena Polio, RN, Cyril Mourning, Technician Referring MD:              Medicines:                Monitored Anesthesia Care Complications:            No immediate complications. Estimated Blood Loss:     Estimated blood loss: none. Procedure:                Pre-Anesthesia Assessment:                           - Prior to the procedure, a History and Physical                            was performed, and patient medications, allergies                            and sensitivities were reviewed. The patient's                            tolerance of previous anesthesia was reviewed.                           - The risks and benefits of the procedure and the                            sedation options and risks were discussed with the                            patient. All questions were answered and informed                            consent was obtained.                           - ASA Grade Assessment: II - A patient with mild                            systemic disease.                           After obtaining informed consent, the colonoscope                            was passed under direct vision. Throughout the  procedure, the patient's blood pressure, pulse, and                            oxygen saturations were monitored continuously. The                            [id number]-[id number] ([id number]) scope was introduced through                            the anus and advanced to the the terminal ileum.                            The colonoscopy was performed without difficulty.                            The  patient tolerated the procedure well. Scope                            withdrawal time was 11 minutes. The quality of the                            bowel preparation was adequate to identify polyps. Scope In: 10:28:50 AM Scope Out: 10:46:25 AM Scope Withdrawal Time: 0 hours 13 minutes 2 seconds  Total Procedure Duration: 0 hours 17 minutes 35 seconds  Findings:      The perianal and digital rectal examinations were normal.      The terminal ileum appeared normal.      The colon (entire examined portion) appeared normal.      Non-bleeding internal hemorrhoids were found during retroflexion. The       hemorrhoids were medium-sized. Impression:               - The examined portion of the ileum was normal.                           - The entire examined colon is normal.                           - Non-bleeding internal hemorrhoids.                           - No specimens collected. Moderate Sedation:      Per Anesthesia Care Recommendation:           - Return patient to hospital ward for ongoing care.                           - Resume previous diet.                           - Repeat colonoscopy in 10 years for screening                            purposes.                           - Can  take Miralax daily to soften bowel movements.                           - If recurrent bleeding despite taking Miralax, can                            have outpatient hemorrhoidal banding. Procedure Code(s):        --- Professional ---                           [phone], Colonoscopy, flexible; diagnostic, including                            collection of specimen(s) by brushing or washing,                            when performed (separate procedure) Diagnosis Code(s):        --- Professional ---                           K64.8, Other hemorrhoids                           K62.5, Hemorrhage of anus and rectum CPT copyright 2019 American Medical Association. All rights reserved. The codes documented in  this report are preliminary and upon coder review may  be revised to meet current compliance requirements. [name], MD [name],  09/11/2020 10:51:06 AM This report has been signed electronically. Number of Addenda: 0

## 2020-09-11 NOTE — Discharge Instructions (Signed)
IMPORTANT INFORMATION: PAY CLOSE ATTENTION   PHYSICIAN DISCHARGE INSTRUCTIONS  Follow with Primary care provider  [facility], Velna Hatchet, MD  and other consultants as instructed by your Hospitalist Physician  SEEK MEDICAL CARE OR RETURN TO EMERGENCY ROOM IF SYMPTOMS COME BACK, WORSEN OR NEW PROBLEM DEVELOPS   Please note: You were cared for by a hospitalist during your hospital stay. Every effort will be made to forward records to your primary care provider.  You can request that your primary care provider send for your hospital records if they have not received them.  Once you are discharged, your primary care physician will handle any further medical issues. Please note that NO REFILLS for any discharge medications will be authorized once you are discharged, as it is imperative that you return to your primary care physician (or establish a relationship with a primary care physician if you do not have one) for your post hospital discharge needs so that they can reassess your need for medications and monitor your lab values.  Please get a complete blood count and chemistry panel checked by your Primary MD at your next visit, and again as instructed by your Primary MD.  Get Medicines reviewed and adjusted: Please take all your medications with you for your next visit with your Primary MD  Laboratory/radiological data: Please request your Primary MD to go over all hospital tests and procedure/radiological results at the follow up, please ask your primary care provider to get all Hospital records sent to his/her office.  In some cases, they will be blood work, cultures and biopsy results pending at the time of your discharge. Please request that your primary care provider follow up on these results.  If you are diabetic, please bring your blood sugar readings with you to your follow up appointment with primary care.    Please call and make your follow up appointments as soon as possible.    Also  Note the following: If you experience worsening of your admission symptoms, develop shortness of breath, life threatening emergency, suicidal or homicidal thoughts you must seek medical attention immediately by calling 911 or calling your MD immediately  if symptoms less severe.  You must read complete instructions/literature along with all the possible adverse reactions/side effects for all the Medicines you take and that have been prescribed to you. Take any new Medicines after you have completely understood and accpet all the possible adverse reactions/side effects.   Do not drive when taking Pain medications or sleeping medications (Benzodiazepines)  Do not take more than prescribed Pain, Sleep and Anxiety Medications. It is not advisable to combine anxiety,sleep and pain medications without talking with your primary care practitioner  Special Instructions: If you have smoked or chewed Tobacco  in the last 2 yrs please stop smoking, stop any regular Alcohol  and or any Recreational drug use.  Wear Seat belts while driving.  Do not drive if taking any narcotic, mind altering or controlled substances or recreational drugs or alcohol.

## 2020-09-11 NOTE — Discharge Summary (Signed)
Physician Discharge Summary  Joshua Escobar RAQ:[id number] DOB: [birth year] DOA: 09/09/2020  PCP: Alycia Rossetti, MD Endocrinologist: Dr. Dorris Fetch  Admit date: 09/09/2020 Discharge date: 09/11/2020  Admitted From:  Home  Disposition: Home   Recommendations for Outpatient Follow-up:  1. Follow up with PCP in 1 weeks 2. Follow up with Dr. Dorris Fetch in 1-2 weeks 3. Take miralax daily for stool softener and hemorrhoids 4. Please stop smoking and using all tobacco 5. Please check blood sugar at least 5 times per day  Discharge Condition: STABLE   CODE STATUS: FULL DIET: carb modified    Brief Hospitalization Summary: Please see all hospital notes, images, labs for full details of the hospitalization. ADMISSION HPI:  Joshua Escobar  is a 50 y.o. male, with history significant for hypertension, hyperlipidemia, diabetes mellitus, migraine, tobacco abuse, chronic back pain, study secondary to generalized weakness and hyperglycemia, is diabetic, and Tresiba 55 units at home, reports he is compliant, he is taking full aspirin daily as well, report for last few days he has been having increased urinary frequency, and thirst, reports his blood glucose over 400 at home, as per report generalized weakness, fatigue, denies chest pain, fever, chills or dyspnea.  As well patient reports intermittent bloating diarrhea, with bright red blood per rectum for last 44-month - in ED patient labs significant for hemoglobin of 6.9, down from baseline of 14.6 couple month ago, platelet count of 92K, from baseline 261k, is Hemoccult negative, blood glucose elevated at 344, normal anion gap, bicarb at 24, but beta hydroxybutyric acid elevated at 2.53, surgery at hospitalist consulted to admit.  Hospital Course:   1. Mild DKA - this has resolved rapidly with IV insulin and now he is transitioned over to subcutaneous insulin and supplemental sliding scale coverage, prandial coverage and CBG monitoring 5x per day.  Titrate doses  as needed for better glycemic control.  He will resume home tresiba but reduced dose to 50 units and added novolog flexpen 12 units TID with meals. Pt advised to test BS at least 5 times per day.  2. Uncontrolled type 1 diabetes mellitus with neuropathy - A1c pending.  Outpatient follow up with Dr. NDorris Fetch   3. IDA symptomatic - s/p 2 units PRBC and Hg back up to 14.  He feels better.  GI consult was requested.  He said he had only been taking the aspirin for about 1 week.  He was started on protonix for GI protection.  He had a colonoscopy done on 4/16 with findings of hemorrhoids.  GI recommended daily miralax as a stool softener.  Hemorrhoids thought to be the cause of the rectal bleeding.   4. Tobacco user - strongly advised to stop especially in the setting of having diabetes mellitus he is HIGH RISK for severe complications.   5. Hyperlipidemia - secondary to poorly controlled diabetes mellitus, will need to control DM before we can have a chance of getting lipids controlled.   6. Thrombocytopenia -likely was reactive, normal result today.  7. Hypokalemia / Hypomagnesemia - IV replacement recheck in AM.   DVT prophylaxis: SCDs early ambulation  Code Status: Full  Family Communication: sig other telephone Disposition: home   Discharge Diagnoses:  Active Problems:   HLD (hyperlipidemia)   DKA (diabetic ketoacidosis) (HCC)   Tobacco use disorder   Type 2 diabetes mellitus with hyperglycemia, without long-term current use of insulin (HCC)   IBS (irritable bowel syndrome)   Symptomatic anemia  Discharge Instructions: Discharge Instructions  Ambulatory referral to Endocrinology   Complete by: As directed    [place] as of 09/11/2020   No Known Allergies     Medication List    TAKE these medications   aspirin EC 325 MG tablet Take 325 mg by mouth daily.   cyclobenzaprine 10 MG tablet Commonly known as: FLEXERIL Take 1 tablet (10 mg total) by mouth  2 (two) times daily as needed for muscle spasms.   Insulin Pen Needle 31G X 5 MM Misc 1 Device by Does not apply route as directed.   NovoLOG FlexPen 100 UNIT/ML FlexPen Generic drug: insulin aspart Inject 12 Units into the skin 3 (three) times daily with meals.   OneTouch Verio test strip Generic drug: glucose blood Use as instructed   OneTouch Verio w/Device Kit 1 each by Does not apply route as needed.   polyethylene glycol 17 g packet Commonly known as: MIRALAX / GLYCOLAX Take 17 g by mouth daily.   potassium chloride 10 MEQ tablet Commonly known as: KLOR-CON Take 1 tablet (10 mEq total) by mouth daily for 5 days.   rosuvastatin 20 MG tablet Commonly known as: CRESTOR Take 1 tablet (20 mg total) by mouth daily.   Tyler Aas FlexTouch 100 UNIT/ML FlexTouch Pen Generic drug: insulin degludec Inject 50 Units into the skin at bedtime. What changed: See the new instructions.       Follow-up Information    [place], Modena Nunnery, MD. Schedule an appointment as soon as possible for a visit in 1 week(s).   Specialty: Family Medicine Contact information: [address] [place] [address] [phone]        Cassandria Anger, MD. Schedule an appointment as soon as possible for a visit in 2 week(s).   Specialty: Endocrinology Why: Hospital Follow Up  Contact information: [place] [address] [phone]              No Known Allergies Allergies as of 09/11/2020   No Known Allergies     Medication List    TAKE these medications   aspirin EC 325 MG tablet Take 325 mg by mouth daily.   cyclobenzaprine 10 MG tablet Commonly known as: FLEXERIL Take 1 tablet (10 mg total) by mouth 2 (two) times daily as needed for muscle spasms.   Insulin Pen Needle 31G X 5 MM Misc 1 Device by Does not apply route as directed.   NovoLOG FlexPen 100 UNIT/ML FlexPen Generic drug: insulin aspart Inject 12 Units into the skin 3 (three) times daily  with meals.   OneTouch Verio test strip Generic drug: glucose blood Use as instructed   OneTouch Verio w/Device Kit 1 each by Does not apply route as needed.   polyethylene glycol 17 g packet Commonly known as: MIRALAX / GLYCOLAX Take 17 g by mouth daily.   potassium chloride 10 MEQ tablet Commonly known as: KLOR-CON Take 1 tablet (10 mEq total) by mouth daily for 5 days.   rosuvastatin 20 MG tablet Commonly known as: CRESTOR Take 1 tablet (20 mg total) by mouth daily.   Tyler Aas FlexTouch 100 UNIT/ML FlexTouch Pen Generic drug: insulin degludec Inject 50 Units into the skin at bedtime. What changed: See the new instructions.       Procedures/Studies: CT ABDOMEN PELVIS W CONTRAST  Result Date: 09/09/2020 CLINICAL DATA:  Lower left-sided abdominal pain. EXAM: CT ABDOMEN AND PELVIS WITH CONTRAST TECHNIQUE: Multidetector CT imaging of the abdomen  and pelvis was performed using the standard protocol following bolus administration of intravenous contrast. CONTRAST:  152m OMNIPAQUE IOHEXOL 300 MG/ML  SOLN COMPARISON:  December 24, 2016 FINDINGS: Lower chest: No acute abnormality. Hepatobiliary: There is mild diffuse fatty infiltration of the liver parenchyma. No focal liver abnormality is seen. No gallstones, gallbladder wall thickening, or biliary dilatation. Pancreas: Unremarkable. No pancreatic ductal dilatation or surrounding inflammatory changes. Spleen: Normal in size without focal abnormality. Adrenals/Urinary Tract: Adrenal glands are unremarkable. Kidneys are normal, without renal calculi, focal lesion, or hydronephrosis. The urinary bladder is moderately distended and is otherwise unremarkable. Stomach/Bowel: Stomach is within normal limits. Appendix appears normal. No evidence of bowel wall thickening, distention, or inflammatory changes. Vascular/Lymphatic: Aortic atherosclerosis. No enlarged abdominal or pelvic lymph nodes. Reproductive: Prostate is unremarkable. Other: No  abdominal wall hernia or abnormality. No abdominopelvic ascites. Musculoskeletal: No acute or significant osseous findings. IMPRESSION: 1. No acute findings in the abdomen or pelvis. 2. Mild diffuse fatty infiltration of the liver. 3. Aortic atherosclerosis. Aortic Atherosclerosis (ICD10-I70.0). Electronically Signed   By: TVirgina NorfolkM.D.   On: 09/09/2020 19:46     Subjective: Pt tolerated colonoscopy with no problems.    Discharge Exam: Vitals:   09/11/20 1130 09/11/20 1205  BP: 91/72 106/78  Pulse: 66 63  Resp:    Temp:  97.8 F (36.6 C)  SpO2:  100%   Vitals:   09/11/20 1124 09/11/20 1128 09/11/20 1130 09/11/20 1205  BP: '92/69 90/64 91/72 ' 106/78  Pulse: 67  66 63  Resp: 17     Temp:    97.8 F (36.6 C)  TempSrc:    Oral  SpO2: 98%   100%  Weight:      Height:       General: Pt is alert, awake, not in acute distress Cardiovascular: RRR, S1/S2 +, no rubs, no gallops Respiratory: CTA bilaterally, no wheezing, no rhonchi Abdominal: Soft, NT, ND, bowel sounds + Extremities: no edema, no cyanosis   The results of significant diagnostics from this hospitalization (including imaging, microbiology, ancillary and laboratory) are listed below for reference.    Microbiology: Recent Results (from the past 240 hour(s))  Resp Panel by RT-PCR (Flu A&B, Covid) Nasopharyngeal Swab     Status: None   Collection Time: 09/09/20  7:34 PM   Specimen: Nasopharyngeal Swab; Nasopharyngeal(NP) swabs in vial transport medium  Result Value Ref Range Status   SARS Coronavirus 2 by RT PCR NEGATIVE NEGATIVE Final    Comment: (NOTE) SARS-CoV-2 target nucleic acids are NOT DETECTED.  The SARS-CoV-2 RNA is generally detectable in upper respiratory specimens during the acute phase of infection. The lowest concentration of SARS-CoV-2 viral copies this assay can detect is 138 copies/mL. A negative result does not preclude SARS-Cov-2 infection and should not be used as the sole basis for  treatment or other patient management decisions. A negative result may occur with  improper specimen collection/handling, submission of specimen other than nasopharyngeal swab, presence of viral mutation(s) within the areas targeted by this assay, and inadequate number of viral copies(<138 copies/mL). A negative result must be combined with clinical observations, patient history, and epidemiological information. The expected result is Negative.  Fact Sheet for Patients:  hEntrepreneurPulse.com.au Fact Sheet for Healthcare Providers:  hIncredibleEmployment.be This test is no t yet approved or cleared by the [place] and  has been authorized for detection and/or diagnosis of SARS-CoV-2 by FDA under an Emergency Use Authorization (EUA). This EUA will remain  in  effect (meaning this test can be used) for the duration of the COVID-19 declaration under Section 564(b)(1) of the Act, 21 U.S.C.section 360bbb-3(b)(1), unless the authorization is terminated  or revoked sooner.       Influenza A by PCR NEGATIVE NEGATIVE Final   Influenza B by PCR NEGATIVE NEGATIVE Final    Comment: (NOTE) The Xpert Xpress SARS-CoV-2/FLU/RSV plus assay is intended as an aid in the diagnosis of influenza from Nasopharyngeal swab specimens and should not be used as a sole basis for treatment. Nasal washings and aspirates are unacceptable for Xpert Xpress SARS-CoV-2/FLU/RSV testing.  Fact Sheet for Patients: EntrepreneurPulse.com.au  Fact Sheet for Healthcare Providers: IncredibleEmployment.be  This test is not yet approved or cleared by the [place] FDA and has been authorized for detection and/or diagnosis of SARS-CoV-2 by FDA under an Emergency Use Authorization (EUA). This EUA will remain in effect (meaning this test can be used) for the duration of the COVID-19 declaration under Section 564(b)(1) of the Act, 21  U.S.C. section 360bbb-3(b)(1), unless the authorization is terminated or revoked.  Performed at [facility], [address], [place], [place] [address]   MRSA PCR Screening     Status: Abnormal   Collection Time: 09/09/20 11:05 PM   Specimen: Nasal Mucosa; Nasopharyngeal  Result Value Ref Range Status   MRSA by PCR POSITIVE (A) NEGATIVE Final    Comment:        The GeneXpert MRSA Assay (FDA approved for NASAL specimens only), is one component of a comprehensive MRSA colonization surveillance program. It is not intended to diagnose MRSA infection nor to guide or monitor treatment for MRSA infections. RESULT CALLED TO, READ BACK BY AND VERIFIED WITH: EMILY W,RN'@[id number]'  09/10/20 [facility] Performed at [facility], [address] [place], [place] [address]      Labs: BNP (last 3 results) No results for input(s): BNP in the last 8760 hours. Basic Metabolic Panel: Recent Labs  Lab 09/09/20 1631 09/09/20 1757 09/10/20 0510 09/11/20 0507  NA 137 134* 136 138  K 3.9 3.6 2.9* 3.0*  CL 100 99 100 103  CO2 '24 26 26 26  ' GLUCOSE 344* 202* 209* 197*  BUN '11 8 6 ' <5*  CREATININE 0.65 0.58* 0.50* 0.51*  CALCIUM 8.8* 8.7* 8.7* 8.6*  MG  --   --  1.4* 1.7   Liver Function Tests: Recent Labs  Lab 09/09/20 1631 09/10/20 0510  AST 13* 18  ALT 17 17  ALKPHOS 97 76  BILITOT 0.8 0.8  PROT 7.2 5.9*  ALBUMIN 3.8 3.2*   Recent Labs  Lab 09/09/20 1631  LIPASE 33   No results for input(s): AMMONIA in the last 168 hours. CBC: Recent Labs  Lab 09/09/20 1631 09/10/20 0814 09/11/20 0507  WBC 3.9* 6.9 5.8  NEUTROABS 2.4  --   --   HGB 6.9* 15.6 14.8  HCT 22.5* 48.0 46.5  MCV 85.9 81.9 82.9  PLT 92* 195 188   Cardiac Enzymes: No results for input(s): CKTOTAL, CKMB, CKMBINDEX, TROPONINI in the last 168 hours. BNP: Invalid input(s): POCBNP CBG: Recent Labs  Lab 09/11/20 0344 09/11/20 0724 09/11/20 0912 09/11/20 0957 09/11/20 1052  GLUCAP 72 126* 65* 102* 71    D-Dimer No results for input(s): DDIMER in the last 72 hours. Hgb A1c No results for input(s): HGBA1C in the last 72 hours. Lipid Profile No results for input(s): CHOL, HDL, LDLCALC, TRIG, CHOLHDL, LDLDIRECT in the last 72 hours. Thyroid function studies No results for input(s): TSH, T4TOTAL, T3FREE,  THYROIDAB in the last 72 hours.  Invalid input(s): FREET3 Anemia work up Recent Labs    09/09/20 1914  VITAMINB12 1,044*  FOLATE 12.6  FERRITIN 381*  TIBC 240*  IRON 31*  RETICCTPCT 1.1   Urinalysis    Component Value Date/Time   COLORURINE STRAW (A) 09/09/2020 1631   APPEARANCEUR CLEAR 09/09/2020 1631   LABSPEC 1.030 09/09/2020 1631   PHURINE 6.0 09/09/2020 1631   GLUCOSEU >=500 (A) 09/09/2020 1631   HGBUR NEGATIVE 09/09/2020 1631   BILIRUBINUR NEGATIVE 09/09/2020 1631   KETONESUR 80 (A) 09/09/2020 1631   PROTEINUR NEGATIVE 09/09/2020 1631   UROBILINOGEN 0.2 06/10/2017 1607   NITRITE NEGATIVE 09/09/2020 1631   LEUKOCYTESUR NEGATIVE 09/09/2020 1631   Sepsis Labs Invalid input(s): PROCALCITONIN,  WBC,  LACTICIDVEN Microbiology Recent Results (from the past 240 hour(s))  Resp Panel by RT-PCR (Flu A&B, Covid) Nasopharyngeal Swab     Status: None   Collection Time: 09/09/20  7:34 PM   Specimen: Nasopharyngeal Swab; Nasopharyngeal(NP) swabs in vial transport medium  Result Value Ref Range Status   SARS Coronavirus 2 by RT PCR NEGATIVE NEGATIVE Final    Comment: (NOTE) SARS-CoV-2 target nucleic acids are NOT DETECTED.  The SARS-CoV-2 RNA is generally detectable in upper respiratory specimens during the acute phase of infection. The lowest concentration of SARS-CoV-2 viral copies this assay can detect is 138 copies/mL. A negative result does not preclude SARS-Cov-2 infection and should not be used as the sole basis for treatment or other patient management decisions. A negative result may occur with  improper specimen collection/handling, submission of specimen  other than nasopharyngeal swab, presence of viral mutation(s) within the areas targeted by this assay, and inadequate number of viral copies(<138 copies/mL). A negative result must be combined with clinical observations, patient history, and epidemiological information. The expected result is Negative.  Fact Sheet for Patients:  EntrepreneurPulse.com.au  Fact Sheet for Healthcare Providers:  IncredibleEmployment.be  This test is no t yet approved or cleared by the [place] FDA and  has been authorized for detection and/or diagnosis of SARS-CoV-2 by FDA under an Emergency Use Authorization (EUA). This EUA will remain  in effect (meaning this test can be used) for the duration of the COVID-19 declaration under Section 564(b)(1) of the Act, 21 U.S.C.section 360bbb-3(b)(1), unless the authorization is terminated  or revoked sooner.       Influenza A by PCR NEGATIVE NEGATIVE Final   Influenza B by PCR NEGATIVE NEGATIVE Final    Comment: (NOTE) The Xpert Xpress SARS-CoV-2/FLU/RSV plus assay is intended as an aid in the diagnosis of influenza from Nasopharyngeal swab specimens and should not be used as a sole basis for treatment. Nasal washings and aspirates are unacceptable for Xpert Xpress SARS-CoV-2/FLU/RSV testing.  Fact Sheet for Patients: EntrepreneurPulse.com.au  Fact Sheet for Healthcare Providers: IncredibleEmployment.be  This test is not yet approved or cleared by the [place] FDA and has been authorized for detection and/or diagnosis of SARS-CoV-2 by FDA under an Emergency Use Authorization (EUA). This EUA will remain in effect (meaning this test can be used) for the duration of the COVID-19 declaration under Section 564(b)(1) of the Act, 21 U.S.C. section 360bbb-3(b)(1), unless the authorization is terminated or revoked.  Performed at [facility], [address] [place], [place]  [address]   MRSA PCR Screening     Status: Abnormal   Collection Time: 09/09/20 11:05 PM   Specimen: Nasal Mucosa; Nasopharyngeal  Result Value Ref Range Status   MRSA by PCR  POSITIVE (A) NEGATIVE Final    Comment:        The GeneXpert MRSA Assay (FDA approved for NASAL specimens only), is one component of a comprehensive MRSA colonization surveillance program. It is not intended to diagnose MRSA infection nor to guide or monitor treatment for MRSA infections. RESULT CALLED TO, READ BACK BY AND VERIFIED WITH: EMILY W,RN'@[id number]'  09/10/20 [facility] Performed at [facility], [address] [place], [place] [address]    Time coordinating discharge: 45 mins   SIGNED:  Irwin Brakeman, MD  [facility] 09/11/2020, 12:51 PM How to contact the [facility] Attending or Consulting provider [place] or covering provider during after hours [place], for this patient?  1. Check the care team in [facility] and look for a) attending/consulting [facility] provider listed and b) the [facility] team listed 2. Log into [email or web address] and use [place]'s universal password to access. If you do not have the password, please contact the hospital operator. 3. Locate the [facility] provider you are looking for under [facility] and page to a number that you can be directly reached. 4. If you still have difficulty reaching the provider, please page the [facility] (Director on Call) for the Hospitalists listed on amion for assistance.

## 2020-09-11 NOTE — Brief Op Note (Addendum)
09/09/2020 - 09/11/2020  10:52 AM  PATIENT:  Joshua Escobar  50 y.o. male  PRE-OPERATIVE DIAGNOSIS:  lower gi bleeding  POST-OPERATIVE DIAGNOSIS:  hemorrhoids;  PROCEDURE:  Procedure(s): COLONOSCOPY WITH PROPOFOL (N/A)  SURGEON:  Surgeon(s) and Role:    * Dolores Frame, MD - Primary  Patient underwent colonoscopy under propofol sedation.  The patient tolerated the procedure adequately. Normal terminal ileum. Colon was thoroughly inspected, there was no presence of any hematin or fresh blood in the colonic lumen, no stigmata of recent bleeding.  No polyps or masses were found.  There was presence of medium size hemorrhoids visualized with retroflexion of the scope.  RECOMMENDATIONS: - Return patient to hospital ward for ongoing care.  - Resume previous diet.  - Repeat colonoscopy in 10 years for screening purposes.  - Can take Miralax daily to soften bowel movements. - If recurrent bleeding despite taking Miralax, can have outpatient hemorrhoidal banding. - GI service will sign-off, please call us back if you have any more questions.  [name], MD Gastroenterology and Hepatology [facility]

## 2020-09-11 NOTE — Anesthesia Preprocedure Evaluation (Signed)
Anesthesia Evaluation  Patient identified by MRN, date of birth, ID band Patient awake    Reviewed: Allergy & Precautions, H&P , NPO status , Patient's Chart, lab work & pertinent test results, reviewed documented beta blocker date and time   Airway Mallampati: II  TM Distance: >3 FB Neck ROM: full    Dental no notable dental hx.    Pulmonary neg pulmonary ROS, Current Smoker,    Pulmonary exam normal breath sounds clear to auscultation       Cardiovascular Exercise Tolerance: Good + CAD and + Past MI   Rhythm:regular Rate:Normal     Neuro/Psych  Headaches, negative psych ROS   GI/Hepatic negative GI ROS, Neg liver ROS,   Endo/Other  negative endocrine ROSdiabetes  Renal/GU negative Renal ROS  negative genitourinary   Musculoskeletal   Abdominal   Peds  Hematology  (+) Blood dyscrasia, anemia ,   Anesthesia Other Findings   Reproductive/Obstetrics negative OB ROS                             Anesthesia Physical Anesthesia Plan  ASA: III and emergent  Anesthesia Plan: General   Post-op Pain Management:    Induction:   PONV Risk Score and Plan: Propofol infusion  Airway Management Planned:   Additional Equipment:   Intra-op Plan:   Post-operative Plan:   Informed Consent: I have reviewed the patients History and Physical, chart, labs and discussed the procedure including the risks, benefits and alternatives for the proposed anesthesia with the patient or authorized representative who has indicated his/her understanding and acceptance.     Dental Advisory Given  Plan Discussed with: CRNA  Anesthesia Plan Comments:         Anesthesia Quick Evaluation

## 2020-09-13 ENCOUNTER — Telehealth: Payer: Self-pay | Admitting: *Deleted

## 2020-09-13 ENCOUNTER — Encounter: Payer: BC Managed Care – PPO | Admitting: Physician Assistant

## 2020-09-13 LAB — HEMOGLOBIN A1C
Hgb A1c MFr Bld: 13.6 % — ABNORMAL HIGH (ref 4.8–5.6)
Mean Plasma Glucose: 344 mg/dL

## 2020-09-13 LAB — GLUCOSE, CAPILLARY: Glucose-Capillary: 64 mg/dL — ABNORMAL LOW (ref 70–99)

## 2020-09-13 NOTE — Telephone Encounter (Signed)
Transition Care Management Follow-up Telephone Call  Date of discharge and from where: 09/11/2020 - [facility]  How have you been since you were released from the hospital? "I am doing just fine"  Any questions or concerns? No  Items Reviewed:  Did the pt receive and understand the discharge instructions provided? Yes   Medications obtained and verified? Yes   Other? No   Any new allergies since your discharge? No   Dietary orders reviewed? No  Do you have support at home? Yes    Functional Questionnaire: (I = Independent and D = Dependent) ADLs: I  Bathing/Dressing- I  Meal Prep- I  Eating- I  Maintaining continence- I  Transferring/Ambulation- I  Managing Meds- I  Follow up appointments reviewed:   PCP Hospital f/u appt confirmed? No    Specialist Hospital f/u appt confirmed? No    Are transportation arrangements needed? No   If their condition worsens, is the pt aware to call PCP or go to the Emergency Dept.? Yes  Was the patient provided with contact information for the PCP's office or ED? Yes  Was to pt encouraged to call back with questions or concerns? Yes

## 2020-09-13 NOTE — Progress Notes (Signed)
Patient was trying to contact his PCP, not [place]. Needs referral to general surgeon for hemorrhoids

## 2020-09-14 DIAGNOSIS — K649 Unspecified hemorrhoids: Secondary | ICD-10-CM | POA: Insufficient documentation

## 2020-09-14 DIAGNOSIS — D649 Anemia, unspecified: Secondary | ICD-10-CM | POA: Diagnosis not present

## 2020-09-14 DIAGNOSIS — E119 Type 2 diabetes mellitus without complications: Secondary | ICD-10-CM | POA: Diagnosis not present

## 2020-09-14 DIAGNOSIS — R5383 Other fatigue: Secondary | ICD-10-CM | POA: Diagnosis not present

## 2020-09-14 DIAGNOSIS — K648 Other hemorrhoids: Secondary | ICD-10-CM | POA: Diagnosis not present

## 2020-09-14 DIAGNOSIS — E785 Hyperlipidemia, unspecified: Secondary | ICD-10-CM | POA: Diagnosis not present

## 2020-09-16 ENCOUNTER — Encounter (HOSPITAL_COMMUNITY): Payer: Self-pay | Admitting: Gastroenterology

## 2020-09-16 ENCOUNTER — Ambulatory Visit: Payer: BC Managed Care – PPO | Admitting: "Endocrinology

## 2020-09-28 ENCOUNTER — Telehealth (INDEPENDENT_AMBULATORY_CARE_PROVIDER_SITE_OTHER): Payer: Self-pay | Admitting: Gastroenterology

## 2020-09-28 ENCOUNTER — Other Ambulatory Visit: Payer: Self-pay

## 2020-09-28 ENCOUNTER — Ambulatory Visit (INDEPENDENT_AMBULATORY_CARE_PROVIDER_SITE_OTHER): Payer: BC Managed Care – PPO | Admitting: General Surgery

## 2020-09-28 ENCOUNTER — Encounter: Payer: Self-pay | Admitting: General Surgery

## 2020-09-28 VITALS — BP 127/76 | HR 100 | Temp 98.4°F | Resp 12 | Ht 68.0 in | Wt 172.0 lb

## 2020-09-28 DIAGNOSIS — K648 Other hemorrhoids: Secondary | ICD-10-CM

## 2020-09-28 MED ORDER — HYDROCORTISONE ACETATE 25 MG RE SUPP: 25.0000 mg | Freq: Two times a day (BID) | RECTAL | 2 refills | Status: DC

## 2020-09-28 NOTE — Progress Notes (Signed)
Joshua Escobar; [id number]; 06/27/1970   HPI Patient is a 50 year old black male who was referred to my care by gastroenterology for evaluation and treatment of bleeding internal hemorrhoidal disease.  Patient was recently hospitalized and underwent a blood transfusion for anemia.  He underwent a colonoscopy on 09/11/2020 which revealed moderately swollen internal hemorrhoids.  It was felt this was the reason for his anemia.  Since that time, he has had intermittent episodes of blood per rectum.  Of note is the fact that he was started on MiraLAX for lower abdominal pain and he has greater than 5 bowel movements a day.  He mostly is concerned about his lower abdominal pain.  He does not feel lightheaded. Past Medical History:  Diagnosis Date  . MI (myocardial infarction) (HCC) 10/2019  . Migraine headache   . Type II diabetes mellitus (HCC)     Past Surgical History:  Procedure Laterality Date  . COLONOSCOPY WITH PROPOFOL N/A 09/11/2020   Procedure: COLONOSCOPY WITH PROPOFOL;  Surgeon: Dolores Frame, MD;  [facility];  Service: Gastroenterology;  Laterality: N/A;  . LEFT HEART CATH AND CORONARY ANGIOGRAPHY N/A 11/27/2016   Procedure: Left Heart Cath and Coronary Angiography;  Surgeon: Runell Gess, MD;  Location: [facility] INVASIVE CV LAB;  Service: Cardiovascular;  Laterality: N/A;    Family History  Problem Relation Age of Onset  . Diabetes Mellitus II Sister   . Diabetes Sister   . Diabetes Mellitus II Brother   . Diabetes Brother   . Diabetes Mellitus II Mother   . Diabetes Mother   . Diabetes Mellitus II Father   . Diabetes Father   . Heart disease Father     Current Outpatient Medications on File Prior to Visit  Medication Sig Dispense Refill  . aspirin EC 325 MG tablet Take 325 mg by mouth daily.    . cyclobenzaprine (FLEXERIL) 10 MG tablet Take 1 tablet (10 mg total) by mouth 2 (two) times daily as needed for muscle spasms. 20 tablet 0  . insulin aspart  (NOVOLOG FLEXPEN) 100 UNIT/ML FlexPen Inject 12 Units into the skin 3 (three) times daily with meals. 15 mL 1  . insulin degludec (TRESIBA FLEXTOUCH) 100 UNIT/ML FlexTouch Pen Inject 50 Units into the skin at bedtime.    . polyethylene glycol (MIRALAX / GLYCOLAX) 17 g packet Take 17 g by mouth daily. 30 packet 1  . rosuvastatin (CRESTOR) 20 MG tablet Take 1 tablet (20 mg total) by mouth daily. 90 tablet 1  . potassium chloride (KLOR-CON) 10 MEQ tablet Take 1 tablet (10 mEq total) by mouth daily for 5 days. 5 tablet 0   No current facility-administered medications on file prior to visit.    No Known Allergies  Social History   Substance and Sexual Activity  Alcohol Use Yes  . Alcohol/week: 1.0 standard drink  . Types: 1 Cans of beer per week   Comment: weekends only    Social History   Tobacco Use  Smoking Status Current Every Day Smoker  . Packs/day: 0.50  . Years: 20.00  . Pack years: 10.00  . Types: Cigarettes  Smokeless Tobacco Never Used    Review of Systems  Constitutional: Negative.   HENT: Negative.   Eyes: Negative.   Respiratory: Negative.   Cardiovascular: Negative.   Gastrointestinal: Positive for abdominal pain, blood in stool and diarrhea.  Genitourinary: Negative.   Musculoskeletal: Positive for back pain.  Skin: Negative.   Neurological: Negative.   Endo/Heme/Allergies: Negative.  Psychiatric/Behavioral: Negative.     Objective   Vitals:   09/28/20 1505  BP: 127/76  Pulse: 100  Resp: 12  Temp: 98.4 F (36.9 C)  SpO2: 96%    Physical Exam Vitals reviewed.  Constitutional:      Appearance: Normal appearance. He is normal weight. He is not ill-appearing.  HENT:     Head: Normocephalic and atraumatic.  Cardiovascular:     Rate and Rhythm: Normal rate and regular rhythm.     Heart sounds: Normal heart sounds. No murmur heard. No friction rub. No gallop.   Pulmonary:     Effort: Pulmonary effort is normal. No respiratory distress.      Breath sounds: Normal breath sounds. No stridor. No wheezing, rhonchi or rales.  Abdominal:     General: Bowel sounds are normal. There is no distension.     Palpations: Abdomen is soft. There is no mass.     Tenderness: There is no abdominal tenderness. There is no guarding.     Hernia: No hernia is present.  Genitourinary:    Comments: No external hemorrhoids are noted.  Normal sphincter tone present.  1-2 internal hemorrhoids are palpable, but not prolapsing or actively bleeding.   Skin:    General: Skin is warm and dry.  Neurological:     Mental Status: He is alert and oriented to person, place, and time.   Primary care notes reviewed Colonoscopy report reviewed Assessment  History of bleeding internal hemorrhoids, anemia requiring blood transfusion, diarrhea of unknown etiology. Plan   I told the patient that he needs to get his bowel movements under control prior to any surgical intervention.  He may not need to be taking MiraLAX like he is.  I cannot explain his lower abdominal pain.  I have prescribed Anusol suppositories to help fully decrease the swelling of the hemorrhoids.  I would like to avoid surgical intervention if possible.  I will see him again in 1 month for follow-up.  I encouraged him to follow-up with Dr. Levon Hedger of GI to assess his lower abdominal pain and diarrhea.

## 2020-09-28 NOTE — Telephone Encounter (Signed)
Patient was seen in the hospital, will need to have an evaluation in GI clinic to further determine next steps in management of his rectal pain. Thanks

## 2020-09-28 NOTE — Patient Instructions (Signed)

## 2020-09-28 NOTE — Telephone Encounter (Signed)
Patient called the office stated he was seen today by Dr Franky Macho for internal hemorrhoids - stated Dr Lovell Sheehan advised him the pain he has is coming from something else - not hemorrhoids - patient had a colonoscopy on 09-11-20  Please advise - ph# [phone]

## 2020-09-30 ENCOUNTER — Encounter (INDEPENDENT_AMBULATORY_CARE_PROVIDER_SITE_OTHER): Payer: Self-pay | Admitting: Gastroenterology

## 2020-09-30 ENCOUNTER — Other Ambulatory Visit: Payer: Self-pay

## 2020-09-30 ENCOUNTER — Telehealth (INDEPENDENT_AMBULATORY_CARE_PROVIDER_SITE_OTHER): Payer: BC Managed Care – PPO | Admitting: Gastroenterology

## 2020-09-30 VITALS — Ht 68.0 in | Wt 172.0 lb

## 2020-09-30 DIAGNOSIS — R140 Abdominal distension (gaseous): Secondary | ICD-10-CM | POA: Diagnosis not present

## 2020-09-30 DIAGNOSIS — K580 Irritable bowel syndrome with diarrhea: Secondary | ICD-10-CM

## 2020-09-30 DIAGNOSIS — K529 Noninfective gastroenteritis and colitis, unspecified: Secondary | ICD-10-CM

## 2020-09-30 MED ORDER — DICYCLOMINE HCL 10 MG PO CAPS
10.0000 mg | ORAL_CAPSULE | Freq: Two times a day (BID) | ORAL | 2 refills | Status: DC | PRN
Start: 2020-09-30 — End: 2020-11-16

## 2020-09-30 NOTE — Progress Notes (Signed)
Joshua Escobar, M.D. Gastroenterology & Hepatology [facility] [address] [place], [place] [address] Primary Care Physician: Oneal Grout, FNP [address] [place] [address]  This is a virtual video visit.  It required patient-provider interaction for the medical decision making as documented below. The patient has consented and agreed to proceed with a Telehealth encounter given the current Coronavirus pandemic.  VIRTUAL VISIT NOTE Patient location: Outside, in a field Provider location: Office  I will communicate my assessment and recommendations to the referring MD via EMR. Note: Occasional unusual wording and randomly placed punctuation marks may result from the use of speech recognition technology to transcribe this document"  Chief Complaint: Abdominal pain, bloating and diarrhea  History of Present Illness: Joshua Escobar is a 50 y.o. male with past medical history diabetes and myocardial infarction, who presents for evaluation of abdominal pain, bloating and diarrhea.  The patient was last seen while hospitalized at [facility] on 09/10/2020.  At that time the patient was admitted with a diagnosis of uncontrolled diabetes for which he was placed on insulin drip.  He was also endorsing some episodes of rectal bleeding.  He was initially found to have a drop in his hemoglobin down to 6.9 from a baseline of 15.  He was given 1 unit of blood with improvement of his hemoglobin back to 15.6.  Due to his episode of rectal bleeding he underwent a colonoscopy, which showed presence of Normal terminal ileum, normal colon with presence of hemorrhoids.  The patient was discharged home with a stable hemoglobin.  Patient reports that he is still feeling abdominal pain in the middle portion of his abdomen, along with episodes. He is currently having >10 BMs per day, which is watery without melena.  He reports that he was  presenting this episode of diarrhea for at least 6 months but he did not have this while hospitalized.  He is still having some episodes of fresh blood In his stool but this is less frequent than in the past. He tried taking probiotics but he has not presented any improvement with it. He is also having recurrent bloating diffusely. Does not take any antispamodics or any other medication to improve his abdominal pain or bloating  He reports that he has been able to control his diabetes better since he was discharged from the hospital with the use of insulin.  States his highest fingersticks have been in the 150s.  The patient denies having any nausea, vomiting, fever, chills, hematochezia, melena, hematemesis, jaundice, pruritus or weight loss.  Last EGD: never Last Colonoscopy:as above  Past Medical History: Past Medical History:  Diagnosis Date  . MI (myocardial infarction) (HCC) 10/2019  . Migraine headache   . Type II diabetes mellitus (HCC)     Past Surgical History: Past Surgical History:  Procedure Laterality Date  . COLONOSCOPY WITH PROPOFOL N/A 09/11/2020   Procedure: COLONOSCOPY WITH PROPOFOL;  Surgeon: Dolores Frame, MD;  [facility];  Service: Gastroenterology;  Laterality: N/A;  . LEFT HEART CATH AND CORONARY ANGIOGRAPHY N/A 11/27/2016   Procedure: Left Heart Cath and Coronary Angiography;  Surgeon: Runell Gess, MD;  Location: [facility] INVASIVE CV LAB;  Service: Cardiovascular;  Laterality: N/A;    Family History: Family History  Problem Relation Age of Onset  . Diabetes Mellitus II Sister   . Diabetes Sister   . Diabetes Mellitus II Brother   . Diabetes Brother   .  Diabetes Mellitus II Mother   . Diabetes Mother   . Diabetes Mellitus II Father   . Diabetes Father   . Heart disease Father     Social History: Social History   Tobacco Use  Smoking Status Current Every Day Smoker  . Packs/day: 0.25  . Years: 20.00  . Pack years: 5.00  .  Types: Cigarettes  Smokeless Tobacco Never Used   Social History   Substance and Sexual Activity  Alcohol Use Yes  . Alcohol/week: 1.0 standard drink  . Types: 1 Cans of beer per week   Comment: weekends only   Social History   Substance and Sexual Activity  Drug Use No    Allergies: No Known Allergies  Medications: Current Outpatient Medications  Medication Sig Dispense Refill  . aspirin EC 325 MG tablet Take 325 mg by mouth daily.    . hydrocortisone (ANUSOL-HC) 25 MG suppository Place 1 suppository (25 mg total) rectally 2 (two) times daily. 12 suppository 2  . insulin aspart (NOVOLOG FLEXPEN) 100 UNIT/ML FlexPen Inject 12 Units into the skin 3 (three) times daily with meals. 15 mL 1  . insulin degludec (TRESIBA FLEXTOUCH) 100 UNIT/ML FlexTouch Pen Inject 50 Units into the skin at bedtime.    . rosuvastatin (CRESTOR) 20 MG tablet Take 1 tablet (20 mg total) by mouth daily. 90 tablet 1  . polyethylene glycol (MIRALAX / GLYCOLAX) 17 g packet Take 17 g by mouth daily. (Patient not taking: Reported on 09/30/2020) 30 packet 1   No current facility-administered medications for this visit.    Review of Systems: GENERAL: negative for malaise, night sweats HEENT: No changes in hearing or vision, no nose bleeds or other nasal problems. NECK: Negative for lumps, goiter, pain and significant neck swelling RESPIRATORY: Negative for cough, wheezing CARDIOVASCULAR: Negative for chest pain, leg swelling, palpitations, orthopnea GI: SEE HPI MUSCULOSKELETAL: Negative for joint pain or swelling, back pain, and muscle pain. SKIN: Negative for lesions, rash PSYCH: Negative for sleep disturbance, mood disorder and recent psychosocial stressors. HEMATOLOGY Negative for prolonged bleeding, bruising easily, and swollen nodes. ENDOCRINE: Negative for cold or heat intolerance, polyuria, polydipsia and goiter. NEURO: negative for tremor, gait imbalance, syncope and seizures. The remainder of the  review of systems is noncontributory.   Physical Exam: GENERAL: The patient is AO x3, in no acute distress. HEENT: Head is normocephalic and atraumatic. EOMI are intact. Mouth is well hydrated and without lesions. LUNGS: Adequate chest expansion. Auscultation could not be performed remotely. HEART: Auscultation could not be performed remotely. ABDOMEN: Nondistended. No masses were observed. EXTREMITIES: Without any cyanosis, clubbing, rash, lesions or edema. NEUROLOGIC: AOx3, no focal motor deficit. SKIN: no jaundice, no rashes  Imaging/Labs: as above  I personally reviewed and interpreted the available labs, imaging and endoscopic files.  Impression and Plan: Joshua Escobar is a 50 y.o. male with past medical history diabetes and myocardial infarction, who presents for evaluation of abdominal pain, bloating and diarrhea.  Patient has presented significant chronic episodes of bloating and diarrhea without a clear cause.  He has had poorly controlled diabetes which could be playing a role in his symptoms as he may have developed diabetic diarrhea.  However, I consider it important to rule out other causes of chronic diarrhea and bloating with celiac serology, TSH, CMP and will check stool testing for infections.  It is also possible that he is presenting symptoms due to SIBO, for which we will check breath test.  We will also check a  repeat CBC as part of follow-up for his recent bleeding episode.  As he has had negative imaging recently, we will rule out any organic causes of increased abdominal pain with an EGD.  Finally, the patient was provided low FODMAP diet as this may improve his symptoms if these are related to IBS-D.  If these investigations are negative and he presents persistent diarrhea, we will attempt a trial of Xifaxan.  -Check CBC, celiac serology, TSH, CMP  - Check C. Diff and GI pathogen panel - Will schedule breath test for SIBO - Explained presumed etiology of IBS  symptoms. Patient was counseled about the benefit of implementing a low FODMAP to improve symptoms and recurrent episodes. A dietary list was provided to the patient. Also, the patient was counseled about the benefit of avoiding stressing situations and potential environmental triggers leading to symptomatology. - Start Bentyl 1 tablet q12h as needed for abdominal pain - Schedule EGD  All questions were answered.      Face-to-face time: I spent a total of  35 minutes  Joshua Blazing, MD Gastroenterology and Hepatology [facility]

## 2020-09-30 NOTE — Patient Instructions (Addendum)
Perform blood workup Perform stool workup Will schedule breath test for SIBO Explained presumed etiology of IBS symptoms. Patient was counseled about the benefit of implementing a low FODMAP to improve symptoms and recurrent episodes. A dietary list was provided to the patient. Also, the patient was counseled about the benefit of avoiding stressing situations and potential environmental triggers leading to symptomatology. Start Bentyl 1 tablet q12h as needed for abdominal pain Schedule EGD

## 2020-10-01 ENCOUNTER — Encounter (INDEPENDENT_AMBULATORY_CARE_PROVIDER_SITE_OTHER): Payer: Self-pay

## 2020-10-01 ENCOUNTER — Other Ambulatory Visit (INDEPENDENT_AMBULATORY_CARE_PROVIDER_SITE_OTHER): Payer: Self-pay

## 2020-10-18 ENCOUNTER — Encounter (INDEPENDENT_AMBULATORY_CARE_PROVIDER_SITE_OTHER): Payer: Self-pay

## 2020-10-19 ENCOUNTER — Other Ambulatory Visit (HOSPITAL_COMMUNITY)
Admission: RE | Admit: 2020-10-19 | Discharge: 2020-10-19 | Disposition: A | Discharge status: Home / Self Care | Payer: BC Managed Care – PPO | Source: Ambulatory Visit | Attending: Gastroenterology | Admitting: Gastroenterology

## 2020-10-26 ENCOUNTER — Ambulatory Visit: Payer: BC Managed Care – PPO | Admitting: General Surgery

## 2020-11-14 ENCOUNTER — Encounter (HOSPITAL_COMMUNITY): Payer: Self-pay

## 2020-11-14 ENCOUNTER — Emergency Department (HOSPITAL_COMMUNITY)
Admission: EM | Admit: 2020-11-14 | Discharge: 2020-11-14 | Disposition: A | Discharge status: Home / Self Care | Payer: BC Managed Care – PPO | Attending: Emergency Medicine | Admitting: Emergency Medicine

## 2020-11-14 ENCOUNTER — Emergency Department (HOSPITAL_COMMUNITY): Payer: BC Managed Care – PPO

## 2020-11-14 ENCOUNTER — Other Ambulatory Visit: Payer: Self-pay

## 2020-11-14 DIAGNOSIS — R110 Nausea: Secondary | ICD-10-CM | POA: Insufficient documentation

## 2020-11-14 DIAGNOSIS — E1110 Type 2 diabetes mellitus with ketoacidosis without coma: Secondary | ICD-10-CM | POA: Insufficient documentation

## 2020-11-14 DIAGNOSIS — R1032 Left lower quadrant pain: Secondary | ICD-10-CM | POA: Diagnosis not present

## 2020-11-14 DIAGNOSIS — R109 Unspecified abdominal pain: Secondary | ICD-10-CM | POA: Diagnosis not present

## 2020-11-14 DIAGNOSIS — R42 Dizziness and giddiness: Secondary | ICD-10-CM | POA: Diagnosis not present

## 2020-11-14 DIAGNOSIS — R404 Transient alteration of awareness: Secondary | ICD-10-CM | POA: Diagnosis not present

## 2020-11-14 DIAGNOSIS — R140 Abdominal distension (gaseous): Secondary | ICD-10-CM | POA: Diagnosis not present

## 2020-11-14 DIAGNOSIS — E1165 Type 2 diabetes mellitus with hyperglycemia: Secondary | ICD-10-CM | POA: Insufficient documentation

## 2020-11-14 DIAGNOSIS — R1084 Generalized abdominal pain: Secondary | ICD-10-CM | POA: Diagnosis not present

## 2020-11-14 DIAGNOSIS — Z955 Presence of coronary angioplasty implant and graft: Secondary | ICD-10-CM | POA: Insufficient documentation

## 2020-11-14 DIAGNOSIS — E1140 Type 2 diabetes mellitus with diabetic neuropathy, unspecified: Secondary | ICD-10-CM | POA: Diagnosis not present

## 2020-11-14 DIAGNOSIS — R5383 Other fatigue: Secondary | ICD-10-CM | POA: Diagnosis not present

## 2020-11-14 DIAGNOSIS — Z794 Long term (current) use of insulin: Secondary | ICD-10-CM | POA: Insufficient documentation

## 2020-11-14 DIAGNOSIS — F17210 Nicotine dependence, cigarettes, uncomplicated: Secondary | ICD-10-CM | POA: Insufficient documentation

## 2020-11-14 DIAGNOSIS — M549 Dorsalgia, unspecified: Secondary | ICD-10-CM | POA: Insufficient documentation

## 2020-11-14 DIAGNOSIS — R197 Diarrhea, unspecified: Secondary | ICD-10-CM | POA: Diagnosis not present

## 2020-11-14 DIAGNOSIS — R1010 Upper abdominal pain, unspecified: Secondary | ICD-10-CM | POA: Diagnosis not present

## 2020-11-14 DIAGNOSIS — R52 Pain, unspecified: Secondary | ICD-10-CM | POA: Diagnosis not present

## 2020-11-14 LAB — CBC WITH DIFFERENTIAL/PLATELET
Abs Immature Granulocytes: 0.02 10*3/uL (ref 0.00–0.07)
Basophils Absolute: 0 10*3/uL (ref 0.0–0.1)
Basophils Relative: 1 %
Eosinophils Absolute: 0.1 10*3/uL (ref 0.0–0.5)
Eosinophils Relative: 2 %
HCT: 46.4 % (ref 39.0–52.0)
Hemoglobin: 14.8 g/dL (ref 13.0–17.0)
Immature Granulocytes: 0 %
Lymphocytes Relative: 44 %
Lymphs Abs: 2.8 10*3/uL (ref 0.7–4.0)
MCH: 26.3 pg (ref 26.0–34.0)
MCHC: 31.9 g/dL (ref 30.0–36.0)
MCV: 82.4 fL (ref 80.0–100.0)
Monocytes Absolute: 0.6 10*3/uL (ref 0.1–1.0)
Monocytes Relative: 9 %
Neutro Abs: 2.8 10*3/uL (ref 1.7–7.7)
Neutrophils Relative %: 44 %
Platelets: 199 10*3/uL (ref 150–400)
RBC: 5.63 MIL/uL (ref 4.22–5.81)
RDW: 13.8 % (ref 11.5–15.5)
WBC: 6.4 10*3/uL (ref 4.0–10.5)
nRBC: 0 % (ref 0.0–0.2)

## 2020-11-14 LAB — COMPREHENSIVE METABOLIC PANEL
ALT: 17 U/L (ref 0–44)
AST: 15 U/L (ref 15–41)
Albumin: 4.2 g/dL (ref 3.5–5.0)
Alkaline Phosphatase: 86 U/L (ref 38–126)
Anion gap: 7 (ref 5–15)
BUN: 21 mg/dL — ABNORMAL HIGH (ref 6–20)
CO2: 27 mmol/L (ref 22–32)
Calcium: 8.9 mg/dL (ref 8.9–10.3)
Chloride: 101 mmol/L (ref 98–111)
Creatinine, Ser: 1.01 mg/dL (ref 0.61–1.24)
GFR, Estimated: >60 mL/min (ref >60)
Glucose, Bld: 291 mg/dL — ABNORMAL HIGH (ref 70–99)
Potassium: 3.6 mmol/L (ref 3.5–5.1)
Sodium: 135 mmol/L (ref 135–145)
Total Bilirubin: 0.3 mg/dL (ref 0.3–1.2)
Total Protein: 7.2 g/dL (ref 6.5–8.1)

## 2020-11-14 LAB — URINALYSIS, ROUTINE W REFLEX MICROSCOPIC
Bacteria, UA: NONE SEEN
Bilirubin Urine: NEGATIVE
Glucose, UA: >=500 mg/dL — AB
Hgb urine dipstick: NEGATIVE
Ketones, ur: 20 mg/dL — AB
Leukocytes,Ua: NEGATIVE
Nitrite: NEGATIVE
Protein, ur: NEGATIVE mg/dL
Specific Gravity, Urine: >1.046 — ABNORMAL HIGH (ref 1.005–1.030)
pH: 6 (ref 5.0–8.0)

## 2020-11-14 LAB — LIPASE, BLOOD: Lipase: 40 U/L (ref 11–51)

## 2020-11-14 LAB — CBG MONITORING, ED: Glucose-Capillary: 268 mg/dL — ABNORMAL HIGH (ref 70–99)

## 2020-11-14 MED ORDER — SODIUM CHLORIDE 0.9 % IV BOLUS
1000.0000 mL | Freq: Once | INTRAVENOUS | Status: AC
  Administered 2020-11-14: 1000 mL via INTRAVENOUS

## 2020-11-14 MED ORDER — IOHEXOL 300 MG/ML  SOLN
100.0000 mL | Freq: Once | INTRAMUSCULAR | Status: AC | PRN
  Administered 2020-11-14: 100 mL via INTRAVENOUS

## 2020-11-14 MED ORDER — ONDANSETRON HCL 4 MG/2ML IJ SOLN
4.0000 mg | Freq: Once | INTRAMUSCULAR | Status: AC
  Administered 2020-11-14: 4 mg via INTRAVENOUS
  Filled 2020-11-14: qty 2

## 2020-11-14 NOTE — ED Notes (Signed)
Fluid challenge complete with zero issues

## 2020-11-14 NOTE — ED Notes (Signed)
Pt ambulated around nursing desk x1 et became so dizzy that he was having trouble standing up

## 2020-11-14 NOTE — Discharge Instructions (Signed)
Your testing today is reassuring.  Keep yourself hydrated.  Follow-up for your EGD on Tuesday as scheduled.  Return to the ED with worsening pain, rectal bleeding, dizziness, any other concerns.

## 2020-11-14 NOTE — ED Provider Notes (Signed)
[facility] EMERGENCY DEPARTMENT Provider Note   CSN: [id number] Arrival date & time: 11/14/20  1601     History Chief Complaint  Patient presents with   Abdominal Pain    Diarrhea x year, stools dark x 1 year    Joshua Escobar is a 50 y.o. male.  Patient with history of diabetes, migraine headaches, questionable IBS, chronic diarrhea and abdominal bloating here by EMS with abdominal pain, diarrhea and bloating for the past 1 year.  States he has had ongoing diffuse abdominal pain which is currently being worked up by Dr. Levon Hedger of gastroenterology.  He comes in tonight because he is worsening pain, frequent diarrhea and nausea which is new.  States he has frequent loose bowel movements that are sometimes black in color sometimes brown in color, sometimes orange in color.  This happens 5-20 times daily.  He has diffuse abdominal pain and cramping.  Nausea but no vomiting.  No fever.  No chest pain or shortness of breath.  Patient states when he goes to sleep he sometimes wakes up and found that he has been incontinent of stool.  This is been ongoing for more than a year as well.  He had a colonoscopy during his hospitalization in April which was unrevealing.  He scheduled for EGD on June 21.  Does not take any blood thinners. Has ongoing back pain for more than 1 year as well.  There is no weakness or numbness in his legs.  He has no incontinence during the waking hours.  No urinary incontinence.  No fevers or vomiting. No dizziness or lightheadedness.  Sugars have been well controlled.  No previous abdominal surgeries.  The history is provided by the patient.  Abdominal Pain Associated symptoms: diarrhea, fatigue and nausea   Associated symptoms: no cough, no dysuria, no fever, no hematuria, no shortness of breath and no vomiting       Past Medical History:  Diagnosis Date   MI (myocardial infarction) (HCC) 10/2019   Migraine headache    Type II diabetes mellitus (HCC)      Patient Active Problem List   Diagnosis Date Noted   Bloating 09/30/2020   Symptomatic anemia 09/09/2020   IBS (irritable bowel syndrome) 11/19/2019   Type 2 diabetes mellitus with hyperglycemia, without long-term current use of insulin (HCC) 01/25/2019   Erectile dysfunction 06/20/2018   Tobacco use disorder 08/28/2017   Chronic diarrhea 05/16/2017   DKA (diabetic ketoacidosis) (HCC) 05/15/2017   Back pain 12/28/2016   Diabetic neuropathy (HCC) 12/28/2016   HLD (hyperlipidemia) 10/03/2016    Past Surgical History:  Procedure Laterality Date   COLONOSCOPY WITH PROPOFOL N/A 09/11/2020   Procedure: COLONOSCOPY WITH PROPOFOL;  Surgeon: Dolores Frame, MD;  Location: [facility];  Service: Gastroenterology;  Laterality: N/A;   LEFT HEART CATH AND CORONARY ANGIOGRAPHY N/A 11/27/2016   Procedure: Left Heart Cath and Coronary Angiography;  Surgeon: Runell Gess, MD;  Location: [facility] INVASIVE CV LAB;  Service: Cardiovascular;  Laterality: N/A;       Family History  Problem Relation Age of Onset   Diabetes Mellitus II Sister    Diabetes Sister    Diabetes Mellitus II Brother    Diabetes Brother    Diabetes Mellitus II Mother    Diabetes Mother    Diabetes Mellitus II Father    Diabetes Father    Heart disease Father     Social History   Tobacco Use   Smoking status: Every Day  Packs/day: 0.25    Years: 20.00    Pack years: 5.00    Types: Cigarettes   Smokeless tobacco: Never  Vaping Use   Vaping Use: Former   Substances: Nicotine, Flavoring  Substance Use Topics   Alcohol use: Yes    Alcohol/week: 1.0 standard drink    Types: 1 Cans of beer per week    Comment: weekends only   Drug use: No    Home Medications Prior to Admission medications   Medication Sig Start Date End Date Taking? Authorizing Provider  dicyclomine (BENTYL) 10 MG capsule Take 1 capsule (10 mg total) by mouth every 12 (twelve) hours as needed for spasms. Patient taking  differently: Take 10 mg by mouth in the morning and at bedtime. 09/30/20   Dolores Frame, MD  hydrocortisone (ANUSOL-HC) 25 MG suppository Place 1 suppository (25 mg total) rectally 2 (two) times daily. Patient not taking: No sig reported 09/28/20   Franky Macho, MD  insulin aspart (NOVOLOG FLEXPEN) 100 UNIT/ML FlexPen Inject 12 Units into the skin 3 (three) times daily with meals. 09/11/20   Johnson, Clanford L, MD  insulin degludec (TRESIBA FLEXTOUCH) 100 UNIT/ML FlexTouch Pen Inject 50 Units into the skin at bedtime. 09/11/20   Johnson, Clanford L, MD  rosuvastatin (CRESTOR) 20 MG tablet Take 1 tablet (20 mg total) by mouth daily. Patient taking differently: Take 20 mg by mouth at bedtime. 05/05/20   Roma Kayser, MD    Allergies    Patient has no known allergies.  Review of Systems   Review of Systems  Constitutional:  Positive for activity change, appetite change and fatigue. Negative for fever.  HENT:  Negative for congestion and rhinorrhea.   Respiratory:  Negative for cough, chest tightness and shortness of breath.   Gastrointestinal:  Positive for abdominal pain, blood in stool, diarrhea and nausea. Negative for vomiting.  Genitourinary:  Negative for dysuria and hematuria.  Musculoskeletal:  Positive for back pain. Negative for arthralgias and myalgias.  Neurological:  Positive for weakness. Negative for dizziness and headaches.   all other systems are negative except as noted in the HPI and PMH.   Physical Exam Updated Vital Signs BP 111/81 (BP Location: Left Arm)   Pulse 100   Temp 98.6 F (37 C) (Oral)   Resp 18   Ht 5\' 8"  (1.727 m)   Wt 78 kg   SpO2 100%   BMI 26.15 kg/m   Physical Exam Vitals and nursing note reviewed.  Constitutional:      General: He is not in acute distress.    Appearance: He is well-developed.  HENT:     Head: Normocephalic and atraumatic.     Mouth/Throat:     Pharynx: No oropharyngeal exudate.  Eyes:      Conjunctiva/sclera: Conjunctivae normal.     Pupils: Pupils are equal, round, and reactive to light.  Neck:     Comments: No meningismus. Cardiovascular:     Rate and Rhythm: Normal rate and regular rhythm.     Heart sounds: Normal heart sounds. No murmur heard. Pulmonary:     Effort: Pulmonary effort is normal. No respiratory distress.     Breath sounds: Normal breath sounds.  Abdominal:     Palpations: Abdomen is soft.     Tenderness: There is abdominal tenderness. There is no guarding or rebound.     Comments: Periumbilical left lower quadrant tenderness with no guarding or rebound  Genitourinary:    Comments: Chaperone present, no gross  blood No hemorrhoids or fissures Musculoskeletal:        General: No tenderness. Normal range of motion.     Cervical back: Normal range of motion and neck supple.     Comments: 5/5 strength in bilateral lower extremities. Ankle plantar and dorsiflexion intact. Great toe extension intact bilaterally. +2 DP and PT pulses. Unable to elicit patellar reflexes bilaterally. Normal gait.   Skin:    General: Skin is warm.  Neurological:     Mental Status: He is alert and oriented to person, place, and time.     Cranial Nerves: No cranial nerve deficit.     Motor: No abnormal muscle tone.     Coordination: Coordination normal.     Comments: No ataxia on finger to nose bilaterally. No pronator drift. 5/5 strength throughout. CN 2-12 intact.Equal grip strength. Sensation intact.   Psychiatric:        Behavior: Behavior normal.    ED Results / Procedures / Treatments   Labs (all labs ordered are listed, but only abnormal results are displayed) Labs Reviewed  COMPREHENSIVE METABOLIC PANEL - Abnormal; Notable for the following components:      Result Value   Glucose, Bld 291 (*)    BUN 21 (*)    All other components within normal limits  URINALYSIS, ROUTINE W REFLEX MICROSCOPIC - Abnormal; Notable for the following components:   Color, Urine STRAW  (*)    Specific Gravity, Urine >1.046 (*)    Glucose, UA >=500 (*)    Ketones, ur 20 (*)    All other components within normal limits  CBG MONITORING, ED - Abnormal; Notable for the following components:   Glucose-Capillary 268 (*)    All other components within normal limits  CBC WITH DIFFERENTIAL/PLATELET  LIPASE, BLOOD  POC OCCULT BLOOD, ED    EKG None  Radiology CT ABDOMEN PELVIS W CONTRAST  Result Date: 11/14/2020 CLINICAL DATA:  Abdominal pain, diarrhea, dark stools EXAM: CT ABDOMEN AND PELVIS WITH CONTRAST TECHNIQUE: Multidetector CT imaging of the abdomen and pelvis was performed using the standard protocol following bolus administration of intravenous contrast. CONTRAST:  OMNIPAQUE IOHEXOL 300 MG/ML  SOLN COMPARISON:  09/09/2020 FINDINGS: Lower chest: No acute pleural or parenchymal lung disease. Hepatobiliary: No focal liver abnormality is seen. No gallstones, gallbladder wall thickening, or biliary dilatation. Pancreas: Unremarkable. No pancreatic ductal dilatation or surrounding inflammatory changes. Spleen: Normal in size without focal abnormality. Adrenals/Urinary Tract: Adrenal glands are unremarkable. Kidneys are normal, without renal calculi, focal lesion, or hydronephrosis. Bladder is unremarkable. Stomach/Bowel: No bowel obstruction or ileus. Normal appendix right lower quadrant. No bowel wall thickening or inflammatory change. Vascular/Lymphatic: Aortic atherosclerosis. No enlarged abdominal or pelvic lymph nodes. Reproductive: Prostate is unremarkable. Other: No free fluid or free gas.  No abdominal wall hernia. Musculoskeletal: No acute or destructive bony lesions. Reconstructed images demonstrate no additional findings. IMPRESSION: 1. No acute intra-abdominal or intrapelvic process. 2.  Aortic Atherosclerosis (ICD10-I70.0). Electronically Signed   By: Sharlet Salina M.D.   On: 11/14/2020 03:38    Procedures Procedures   Medications Ordered in ED Medications   sodium chloride 0.9 % bolus 1,000 mL (has no administration in time range)  ondansetron (ZOFRAN) injection 4 mg (has no administration in time range)    ED Course  I have reviewed the triage vital signs and the nursing notes.  Pertinent labs & imaging results that were available during my care of the patient were reviewed by me and considered in my medical  decision making (see chart for details).    MDM Rules/Calculators/A&P                         Ongoing abdominal bloating with diarrhea, blood in the stool.  Scheduled for EGD in 2 days.  Abdomen soft without peritoneal signs.  Hemoglobin is stable.  Hyperglycemia without DKA. CT shows no acute findings.  Hemoccult today is negative.  Patient able to tolerate p.o.  Work-up is reassuring.  Patient sleeping throughout majority of ED visit.  CT scan as above shows no acute findings.  Hemoccult today is negative and hemoglobin is 14. Patient scheduled for EGD in 2 days.  No evidence of acute GI bleeding currently.  On attempted ambulation, patient quite unsteady on his feet and dizzy to the point of almost falling over. Brought back to bed.  Given additional liter of fluid will check orthostatics as well as CT head. Able to tolerate fluids.  Labs do not suggest dehydration.  With his dizziness and unsteady gait, will will hydrate and check orthostatics and obtain CT head.  Care to be transferred at shift change with Dr. Estell Harpin.  If unable to ambulate without dizziness may need further imaging of his brain.  Consider lumbar spine imaging as well with reports of bowel incontinence at night. Final Clinical Impression(s) / ED Diagnoses Final diagnoses:  None    Rx / DC Orders ED Discharge Orders     None        Danett Palazzo, Jeannett Senior, MD 11/14/20 [phone]

## 2020-11-14 NOTE — ED Triage Notes (Signed)
[facility] brought pt in for abd pain, diaarhea, and dark stools x one year.  Pt says his wife wanted him to come up. Pt says he has been worked up for this and is scheduled for EGD in two days, pt sees GI, Dr Levon Hedger for these issues.

## 2020-11-15 ENCOUNTER — Other Ambulatory Visit (HOSPITAL_COMMUNITY): Payer: BC Managed Care – PPO

## 2020-11-15 LAB — POC OCCULT BLOOD, ED: Fecal Occult Bld: NEGATIVE

## 2020-11-16 ENCOUNTER — Ambulatory Visit (HOSPITAL_COMMUNITY): Payer: BC Managed Care – PPO | Admitting: Anesthesiology

## 2020-11-16 ENCOUNTER — Encounter (HOSPITAL_COMMUNITY)
Admission: RE | Disposition: A | Discharge status: Home / Self Care | Payer: Self-pay | Source: Home / Self Care | Attending: Gastroenterology

## 2020-11-16 ENCOUNTER — Other Ambulatory Visit (INDEPENDENT_AMBULATORY_CARE_PROVIDER_SITE_OTHER): Payer: Self-pay

## 2020-11-16 ENCOUNTER — Other Ambulatory Visit: Payer: Self-pay

## 2020-11-16 ENCOUNTER — Other Ambulatory Visit (INDEPENDENT_AMBULATORY_CARE_PROVIDER_SITE_OTHER): Payer: Self-pay | Admitting: Gastroenterology

## 2020-11-16 ENCOUNTER — Encounter (HOSPITAL_COMMUNITY): Payer: Self-pay | Admitting: Gastroenterology

## 2020-11-16 ENCOUNTER — Ambulatory Visit (HOSPITAL_COMMUNITY)
Admission: RE | Admit: 2020-11-16 | Discharge: 2020-11-16 | Disposition: A | Discharge status: Home / Self Care | Payer: BC Managed Care – PPO | Attending: Gastroenterology | Admitting: Gastroenterology

## 2020-11-16 DIAGNOSIS — I252 Old myocardial infarction: Secondary | ICD-10-CM | POA: Insufficient documentation

## 2020-11-16 DIAGNOSIS — R140 Abdominal distension (gaseous): Secondary | ICD-10-CM | POA: Diagnosis not present

## 2020-11-16 DIAGNOSIS — F17210 Nicotine dependence, cigarettes, uncomplicated: Secondary | ICD-10-CM | POA: Insufficient documentation

## 2020-11-16 DIAGNOSIS — Z794 Long term (current) use of insulin: Secondary | ICD-10-CM | POA: Insufficient documentation

## 2020-11-16 DIAGNOSIS — E119 Type 2 diabetes mellitus without complications: Secondary | ICD-10-CM | POA: Diagnosis not present

## 2020-11-16 DIAGNOSIS — K529 Noninfective gastroenteritis and colitis, unspecified: Secondary | ICD-10-CM

## 2020-11-16 DIAGNOSIS — R197 Diarrhea, unspecified: Secondary | ICD-10-CM | POA: Insufficient documentation

## 2020-11-16 DIAGNOSIS — R109 Unspecified abdominal pain: Secondary | ICD-10-CM | POA: Insufficient documentation

## 2020-11-16 HISTORY — PX: ESOPHAGOGASTRODUODENOSCOPY (EGD) WITH PROPOFOL: SHX5813

## 2020-11-16 HISTORY — PX: BIOPSY: SHX5522

## 2020-11-16 LAB — CBC
HCT: 44.2 % (ref 39.0–52.0)
Hemoglobin: 14 g/dL (ref 13.0–17.0)
MCH: 26.2 pg (ref 26.0–34.0)
MCHC: 31.7 g/dL (ref 30.0–36.0)
MCV: 82.8 fL (ref 80.0–100.0)
Platelets: 208 10*3/uL (ref 150–400)
RBC: 5.34 MIL/uL (ref 4.22–5.81)
RDW: 13.8 % (ref 11.5–15.5)
WBC: 5.6 10*3/uL (ref 4.0–10.5)
nRBC: 0 % (ref 0.0–0.2)

## 2020-11-16 LAB — COMPREHENSIVE METABOLIC PANEL
ALT: 16 U/L (ref 0–44)
AST: 16 U/L (ref 15–41)
Albumin: 3.7 g/dL (ref 3.5–5.0)
Alkaline Phosphatase: 71 U/L (ref 38–126)
Anion gap: 6 (ref 5–15)
BUN: 9 mg/dL (ref 6–20)
CO2: 26 mmol/L (ref 22–32)
Calcium: 8.8 mg/dL — ABNORMAL LOW (ref 8.9–10.3)
Chloride: 103 mmol/L (ref 98–111)
Creatinine, Ser: 0.79 mg/dL (ref 0.61–1.24)
GFR, Estimated: >60 mL/min (ref >60)
Glucose, Bld: 285 mg/dL — ABNORMAL HIGH (ref 70–99)
Potassium: 3.6 mmol/L (ref 3.5–5.1)
Sodium: 135 mmol/L (ref 135–145)
Total Bilirubin: 0.9 mg/dL (ref 0.3–1.2)
Total Protein: 6.2 g/dL — ABNORMAL LOW (ref 6.5–8.1)

## 2020-11-16 LAB — C-REACTIVE PROTEIN: CRP: 0.7 mg/dL (ref <1.0)

## 2020-11-16 LAB — GLUCOSE, CAPILLARY: Glucose-Capillary: 279 mg/dL — ABNORMAL HIGH (ref 70–99)

## 2020-11-16 LAB — TSH: TSH: 1.53 u[IU]/mL (ref 0.350–4.500)

## 2020-11-16 SURGERY — ESOPHAGOGASTRODUODENOSCOPY (EGD) WITH PROPOFOL
Anesthesia: General

## 2020-11-16 MED ORDER — PHENYLEPHRINE 40 MCG/ML (10ML) SYRINGE FOR IV PUSH (FOR BLOOD PRESSURE SUPPORT)
PREFILLED_SYRINGE | INTRAVENOUS | Status: DC | PRN
  Administered 2020-11-16: 120 ug via INTRAVENOUS

## 2020-11-16 MED ORDER — PROPOFOL 500 MG/50ML IV EMUL
INTRAVENOUS | Status: DC | PRN
  Administered 2020-11-16: 150 ug/kg/min via INTRAVENOUS

## 2020-11-16 MED ORDER — LIDOCAINE HCL (CARDIAC) PF 100 MG/5ML IV SOSY
PREFILLED_SYRINGE | INTRAVENOUS | Status: DC | PRN
  Administered 2020-11-16: 50 mg via INTRAVENOUS

## 2020-11-16 MED ORDER — PHENYLEPHRINE 40 MCG/ML (10ML) SYRINGE FOR IV PUSH (FOR BLOOD PRESSURE SUPPORT)
PREFILLED_SYRINGE | INTRAVENOUS | Status: AC
  Filled 2020-11-16: qty 10

## 2020-11-16 MED ORDER — PROPOFOL 10 MG/ML IV BOLUS
INTRAVENOUS | Status: DC | PRN
  Administered 2020-11-16: 100 mg via INTRAVENOUS

## 2020-11-16 MED ORDER — HYOSCYAMINE SULFATE 0.125 MG SL SUBL: 0.1250 mg | SUBLINGUAL_TABLET | Freq: Four times a day (QID) | SUBLINGUAL | 2 refills | Status: DC | PRN

## 2020-11-16 MED ORDER — LACTATED RINGERS IV SOLN: INTRAVENOUS | Status: DC

## 2020-11-16 NOTE — Progress Notes (Signed)
Crystal, please send him the orders for the stool testing

## 2020-11-16 NOTE — Op Note (Addendum)
[facility] Patient Name: Joshua Escobar Procedure Date: 11/16/2020 1:07 PM MRN: [id number] Date of Birth: Mar 30, 1971 Attending MD: [name] ,  CSN: [id number] Age: 50 Admit Type: Outpatient Procedure:                Upper GI endoscopy Indications:              Abdominal pain, Diarrhea Providers:                [name], Criselda Peaches. Patsy Lager, RN, Pandora Leiter, Technician Referring MD:              Medicines:                Monitored Anesthesia Care Complications:            No immediate complications. Estimated Blood Loss:     Estimated blood loss: none. Procedure:                Pre-Anesthesia Assessment:                           - Prior to the procedure, a History and Physical                            was performed, and patient medications, allergies                            and sensitivities were reviewed. The patient's                            tolerance of previous anesthesia was reviewed.                           - The risks and benefits of the procedure and the                            sedation options and risks were discussed with the                            patient. All questions were answered and informed                            consent was obtained.                           - ASA Grade Assessment: III - A patient with severe                            systemic disease.                           After obtaining informed consent, the endoscope was                            passed under direct vision. Throughout the  procedure, the patient's blood pressure, pulse, and                            oxygen saturations were monitored continuously. The                            [id number]-H190 ([id number]) scope was introduced through the                            mouth, and advanced to the second part of duodenum.                            The upper GI endoscopy was accomplished without                             difficulty. The patient tolerated the procedure                            well. Scope In: 1:14:17 PM Scope Out: 1:19:53 PM Total Procedure Duration: 0 hours 5 minutes 36 seconds  Findings:      The examined esophagus was normal.      The entire examined stomach was normal.      The examined duodenum was normal. Biopsies were taken with a cold       forceps for histology. Impression:               - Normal esophagus.                           - Normal stomach.                           - Normal examined duodenum. Biopsied. Moderate Sedation:      Per Anesthesia Care Recommendation:           - Discharge patient to home (ambulatory).                           - Resume previous diet.                           - Await pathology results.                           - Perform blood testing today.                           - Perform stool testing as outpatient.                           - Start Levsin as needed for abdominal pain                           - Can take Imodium as needed every 4 hours for                            episodes of diarrhea                           -  Proceed with scheduled hydrogen breath testing                            for evaluation of SIBO Procedure Code(s):        --- Professional ---                           [phone], Esophagogastroduodenoscopy, flexible,                            transoral; with biopsy, single or multiple Diagnosis Code(s):        --- Professional ---                           R10.9, Unspecified abdominal pain                           R19.7, Diarrhea, unspecified CPT copyright 2019 American Medical Association. All rights reserved. The codes documented in this report are preliminary and upon coder review may  be revised to meet current compliance requirements. [name], MD [name],  11/16/2020 1:29:28 PM This report has been signed electronically. Number of Addenda: 0

## 2020-11-16 NOTE — H&P (Signed)
Joshua Escobar is an 50 y.o. male.   Chief Complaint: Abdominal pain and diarrhea HPI: Joshua Escobar is a 50 y.o. male with past medical history diabetes and myocardial infarction, who presents for evaluation of abdominal pain, bloating and diarrhea.  The patient has presented with persistent diarrhea up to 5 times per day, sometimes having some episodes overnight described as fecal incontinence.  No melena or hematochezia but he has watery bowel movements daily.  States that he is having recurrent episodes of abdominal pain in his mid abdomen that have led to significant discomfort.  He has tried to control his fingersticks as best as possible and sometimes his sugars have been as low as in the 80s.  The patient came to the ER on 11/14/2020, underwent a CT of the abdomen and pelvis with IV contrast which was normal.  Also had a head CT without contrast which was unremarkable.  The patient had blood testing ordered and stool testing but he did not receive the orders for this.  Past Medical History:  Diagnosis Date   MI (myocardial infarction) (HCC) 10/2019   Migraine headache    Type II diabetes mellitus (HCC)     Past Surgical History:  Procedure Laterality Date   COLONOSCOPY WITH PROPOFOL N/A 09/11/2020   Procedure: COLONOSCOPY WITH PROPOFOL;  Surgeon: Dolores Frame, MD;  Location: [facility];  Service: Gastroenterology;  Laterality: N/A;   LEFT HEART CATH AND CORONARY ANGIOGRAPHY N/A 11/27/2016   Procedure: Left Heart Cath and Coronary Angiography;  Surgeon: Runell Gess, MD;  Location: [facility] INVASIVE CV LAB;  Service: Cardiovascular;  Laterality: N/A;    Family History  Problem Relation Age of Onset   Diabetes Mellitus II Sister    Diabetes Sister    Diabetes Mellitus II Brother    Diabetes Brother    Diabetes Mellitus II Mother    Diabetes Mother    Diabetes Mellitus II Father    Diabetes Father    Heart disease Father    Social History:  reports that he has  been smoking cigarettes. He has a 5.00 pack-year smoking history. He has never used smokeless tobacco. He reports current alcohol use of about 1.0 standard drink of alcohol per week. He reports that he does not use drugs.  Allergies: No Known Allergies  Medications Prior to Admission  Medication Sig Dispense Refill   dicyclomine (BENTYL) 10 MG capsule Take 1 capsule (10 mg total) by mouth every 12 (twelve) hours as needed for spasms. (Patient taking differently: Take 10 mg by mouth in the morning and at bedtime.) 120 capsule 2   insulin aspart (NOVOLOG FLEXPEN) 100 UNIT/ML FlexPen Inject 12 Units into the skin 3 (three) times daily with meals. 15 mL 1   insulin degludec (TRESIBA FLEXTOUCH) 100 UNIT/ML FlexTouch Pen Inject 50 Units into the skin at bedtime.     rosuvastatin (CRESTOR) 20 MG tablet Take 1 tablet (20 mg total) by mouth daily. (Patient taking differently: Take 20 mg by mouth at bedtime.) 90 tablet 1   hydrocortisone (ANUSOL-HC) 25 MG suppository Place 1 suppository (25 mg total) rectally 2 (two) times daily. (Patient not taking: No sig reported) 12 suppository 2    Results for orders placed or performed during the hospital encounter of 11/16/20 (from the past 48 hour(s))  Glucose, capillary     Status: Abnormal   Collection Time: 11/16/20 11:46 AM  Result Value Ref Range   Glucose-Capillary 279 (H) 70 - 99 mg/dL  Comment: Glucose reference range applies only to samples taken after fasting for at least 8 hours.   No results found.  Review of Systems  Constitutional: Negative.   HENT: Negative.    Eyes: Negative.   Respiratory: Negative.    Cardiovascular: Negative.   Gastrointestinal:  Positive for abdominal pain and diarrhea.  Endocrine: Negative.   Genitourinary: Negative.   Musculoskeletal: Negative.   Skin: Negative.   Allergic/Immunologic: Negative.   Neurological: Negative.   Hematological: Negative.   Psychiatric/Behavioral: Negative.     Blood pressure  116/84, pulse 69, temperature 98.4 F (36.9 C), temperature source Oral, resp. rate 17, SpO2 99 %. Physical Exam  GENERAL: The patient is AO x3, in no acute distress. HEENT: Head is normocephalic and atraumatic. EOMI are intact. Mouth is well hydrated and without lesions. NECK: Supple. No masses LUNGS: Clear to auscultation. No presence of rhonchi/wheezing/rales. Adequate chest expansion HEART: RRR, normal s1 and s2. ABDOMEN: mildly tender, to palpation diffusely , no guarding, no peritoneal signs, and nondistended. BS +. No masses. EXTREMITIES: Without any cyanosis, clubbing, rash, lesions or edema. NEUROLOGIC: AOx3, no focal motor deficit. SKIN: no jaundice, no rashes  Assessment/Plan Joshua Escobar is a 50 y.o. male with past medical history diabetes and myocardial infarction, who presents for evaluation of abdominal pain, bloating and diarrhea.  We will proceed with an EGD.  Dolores Frame, MD 11/16/2020, 12:33 PM

## 2020-11-16 NOTE — Anesthesia Postprocedure Evaluation (Signed)
Anesthesia Post Note  Patient: ADYAN PALAU  Procedure(s) Performed: ESOPHAGOGASTRODUODENOSCOPY (EGD) WITH PROPOFOL BIOPSY  Patient location during evaluation: Endoscopy Anesthesia Type: General Level of consciousness: awake and alert and oriented Pain management: pain level controlled Vital Signs Assessment: post-procedure vital signs reviewed and stable Respiratory status: spontaneous breathing and respiratory function stable Cardiovascular status: blood pressure returned to baseline and stable Postop Assessment: no apparent nausea or vomiting Anesthetic complications: no   No notable events documented.   Last Vitals:  Vitals:   11/16/20 1157 11/16/20 1331  BP: 116/84 96/64  Pulse:  75  Resp:  18  Temp:  (!) 36.4 C  SpO2:  100%    Last Pain:  Vitals:   11/16/20 1331  TempSrc: Oral  PainSc: 8                  Jalessa Peyser C Jarret Torre

## 2020-11-16 NOTE — Transfer of Care (Signed)
Immediate Anesthesia Transfer of Care Note  Patient: Joshua Escobar  Procedure(s) Performed: ESOPHAGOGASTRODUODENOSCOPY (EGD) WITH PROPOFOL BIOPSY  Patient Location: Endoscopy Unit  Anesthesia Type:General  Level of Consciousness: drowsy  Airway & Oxygen Therapy: Patient Spontanous Breathing  Post-op Assessment: Report given to RN and Post -op Vital signs reviewed and stable  Post vital signs: Reviewed and stable  Last Vitals:  Vitals Value Taken Time  BP    Temp    Pulse    Resp    SpO2      Last Pain:  Vitals:   11/16/20 1310  TempSrc:   PainSc: 8       Patients Stated Pain Goal: 2 (11/16/20 1156)  Complications: No notable events documented.

## 2020-11-16 NOTE — Anesthesia Preprocedure Evaluation (Addendum)
Anesthesia Evaluation  Patient identified by MRN, date of birth, ID band Patient awake    Reviewed: Allergy & Precautions, NPO status , Patient's Chart, lab work & pertinent test results  Airway Mallampati: II  TM Distance: >3 FB Neck ROM: Full    Dental  (+) Dental Advisory Given, Chipped Left upper broken tooth:   Pulmonary Current SmokerPatient did not abstain from smoking.,    Pulmonary exam normal breath sounds clear to auscultation       Cardiovascular Exercise Tolerance: Good + Past MI (As per patient, cardiac cath negative)  Normal cardiovascular exam Rhythm:Regular Rate:Normal     Neuro/Psych  Headaches,  Neuromuscular disease negative psych ROS   GI/Hepatic negative GI ROS, Neg liver ROS,   Endo/Other  diabetes, Well Controlled, Type 2, Insulin Dependent  Renal/GU negative Renal ROS     Musculoskeletal  (+) Arthritis  (back pain),   Abdominal   Peds  Hematology  (+) anemia ,   Anesthesia Other Findings As per patient, cardiac cath negative  Reproductive/Obstetrics negative OB ROS                            Anesthesia Physical Anesthesia Plan  ASA: 3  Anesthesia Plan: General   Post-op Pain Management:    Induction: Intravenous  PONV Risk Score and Plan: Propofol infusion  Airway Management Planned: Nasal Cannula and Natural Airway  Additional Equipment:   Intra-op Plan:   Post-operative Plan:   Informed Consent: I have reviewed the patients History and Physical, chart, labs and discussed the procedure including the risks, benefits and alternatives for the proposed anesthesia with the patient or authorized representative who has indicated his/her understanding and acceptance.       Plan Discussed with: CRNA and Surgeon  Anesthesia Plan Comments:        Anesthesia Quick Evaluation

## 2020-11-16 NOTE — Anesthesia Procedure Notes (Signed)
Date/Time: 11/16/2020 1:16 PM Performed by: Julian Reil, CRNA Pre-anesthesia Checklist: Patient identified, Emergency Drugs available and Suction available Patient Re-evaluated:Patient Re-evaluated prior to induction Oxygen Delivery Method: Nasal cannula Induction Type: IV induction Placement Confirmation: positive ETCO2

## 2020-11-16 NOTE — Progress Notes (Signed)
Mailed orders to the patient to have drawn at [facility] lab next door. Mailed 11/16/2020 with address to [facility] on the orders.

## 2020-11-16 NOTE — Discharge Instructions (Addendum)
You are being discharged to home.  Resume your previous diet.  We are waiting for your pathology results.  Perform blood testing today.  Perform stool testing as outpatient. Start Levsin as needed for abdominal pain Can take Imodium as needed every 4 hours for episodes of diarrhea Referral to hydrogen breath testing for evaluation of SIBO

## 2020-11-17 LAB — GLIADIN ANTIBODIES, SERUM
Antigliadin Abs, IgA: 2 units (ref 0–19)
Gliadin IgG: 1 units (ref 0–19)

## 2020-11-18 LAB — TISSUE TRANSGLUTAMINASE, IGA: Tissue Transglutaminase Ab, IgA: <2 U/mL (ref 0–3)

## 2020-11-18 LAB — SURGICAL PATHOLOGY

## 2020-11-22 ENCOUNTER — Encounter (HOSPITAL_COMMUNITY): Payer: Self-pay | Admitting: Gastroenterology

## 2020-11-26 LAB — RETICULIN ANTIBODIES, IGA W TITER: Reticulin Ab, IgA: NEGATIVE titer (ref <2.5)

## 2020-12-15 DIAGNOSIS — Z20822 Contact with and (suspected) exposure to covid-19: Secondary | ICD-10-CM | POA: Diagnosis not present

## 2020-12-15 DIAGNOSIS — R0981 Nasal congestion: Secondary | ICD-10-CM | POA: Diagnosis not present

## 2021-01-24 ENCOUNTER — Ambulatory Visit: Payer: BC Managed Care – PPO | Admitting: "Endocrinology

## 2021-02-17 DIAGNOSIS — M545 Low back pain, unspecified: Secondary | ICD-10-CM | POA: Diagnosis not present

## 2021-03-25 DIAGNOSIS — M545 Low back pain, unspecified: Secondary | ICD-10-CM | POA: Diagnosis not present

## 2021-03-25 DIAGNOSIS — R5383 Other fatigue: Secondary | ICD-10-CM | POA: Diagnosis not present

## 2021-03-25 DIAGNOSIS — D509 Iron deficiency anemia, unspecified: Secondary | ICD-10-CM | POA: Diagnosis not present

## 2021-03-25 DIAGNOSIS — E119 Type 2 diabetes mellitus without complications: Secondary | ICD-10-CM | POA: Diagnosis not present

## 2021-04-03 DIAGNOSIS — R5381 Other malaise: Secondary | ICD-10-CM | POA: Diagnosis not present

## 2021-04-03 DIAGNOSIS — R531 Weakness: Secondary | ICD-10-CM | POA: Diagnosis not present

## 2021-09-11 DIAGNOSIS — I10 Essential (primary) hypertension: Secondary | ICD-10-CM | POA: Insufficient documentation

## 2022-10-05 DIAGNOSIS — K859 Acute pancreatitis without necrosis or infection, unspecified: Secondary | ICD-10-CM | POA: Insufficient documentation

## 2023-01-23 DIAGNOSIS — E11628 Type 2 diabetes mellitus with other skin complications: Secondary | ICD-10-CM | POA: Insufficient documentation

## 2023-01-25 DIAGNOSIS — L0390 Cellulitis, unspecified: Secondary | ICD-10-CM | POA: Insufficient documentation

## 2023-01-25 DIAGNOSIS — E119 Type 2 diabetes mellitus without complications: Secondary | ICD-10-CM | POA: Insufficient documentation

## 2023-02-20 ENCOUNTER — Ambulatory Visit (INDEPENDENT_AMBULATORY_CARE_PROVIDER_SITE_OTHER): Payer: BC Managed Care – PPO

## 2023-02-20 ENCOUNTER — Ambulatory Visit (INDEPENDENT_AMBULATORY_CARE_PROVIDER_SITE_OTHER): Payer: BC Managed Care – PPO | Admitting: Podiatry

## 2023-02-20 DIAGNOSIS — L988 Other specified disorders of the skin and subcutaneous tissue: Secondary | ICD-10-CM

## 2023-02-20 DIAGNOSIS — M79671 Pain in right foot: Secondary | ICD-10-CM | POA: Diagnosis not present

## 2023-02-20 DIAGNOSIS — L03115 Cellulitis of right lower limb: Secondary | ICD-10-CM

## 2023-02-20 NOTE — Progress Notes (Signed)
Subjective:  Patient ID: Joshua Escobar, male    DOB: [birth year],   MRN: [id number]  Chief Complaint  Patient presents with   Blister    Pt presents today with blister on rt foot that is open and leaking  fluid     52 y.o. male presents for concern of a blister on his right foot that was leaking fluid and open. Relates he was in the hosptial earlier this month in [place] for infection in this foot. Relates he was sent home on antitbiocis. He is visiting his brother with cancer here and wanted to follow up with a podiatrist in the area. Relates no pain today. Has not been dressing and finished antibitoics .Patient is diabetic and last A1c was  Lab Results  Component Value Date   HGBA1C 13.6 (H) 09/10/2020   .   PCP:  Oneal Grout, FNP    Denies any other pedal complaints. Denies n/v/f/c.   Past Medical History:  Diagnosis Date   MI (myocardial infarction) (HCC) 10/2019   Migraine headache    Type II diabetes mellitus (HCC)     Objective:  Physical Exam: Vascular: DP/PT pulses 2/4 bilateral. CFT <3 seconds. Normal hair growth on digits. No edema.  Skin. No lacerations or abrasions bilateral feet. Mild darkening and resolving blister and erythema of fith digit. No open ulcerations. Maceration noted in the fourth interspace.  Musculoskeletal: MMT 5/5 bilateral lower extremities in DF, PF, Inversion and Eversion. Deceased ROM in DF of ankle joint.  Neurological: Sensation intact to light touch.   Assessment:   1. Cellulitis of right foot   2. Maceration of skin      Plan:  Patient was evaluated and treated and all questions answered. Resolving cellulitis of right fifth toe, no open ulceration. X-ray negative for any osseous erosions   -Debridement as below. -Dressed with betadine, DSD. -No abx indicated.  -Discussed glucose control and proper protein-rich diet.  -Discussed if any worsening redness, pain, fever or chills to call or may need to report to the emergency  room. Patient expressed understanding.   Follow-up in a couple months for recheck   Return in about 2 months (around 04/22/2023) for right foot pain.   Louann Sjogren, DPM

## 2023-04-23 ENCOUNTER — Ambulatory Visit (INDEPENDENT_AMBULATORY_CARE_PROVIDER_SITE_OTHER): Payer: BC Managed Care – PPO | Admitting: Podiatry

## 2023-04-23 DIAGNOSIS — Z91199 Patient's noncompliance with other medical treatment and regimen due to unspecified reason: Secondary | ICD-10-CM

## 2023-04-23 NOTE — Progress Notes (Signed)
No show

## 2023-05-16 ENCOUNTER — Emergency Department (HOSPITAL_COMMUNITY): Payer: Self-pay

## 2023-05-16 ENCOUNTER — Emergency Department (HOSPITAL_COMMUNITY)
Admission: EM | Admit: 2023-05-16 | Discharge: 2023-05-16 | Disposition: A | Discharge status: Home / Self Care | Payer: Self-pay | Attending: Emergency Medicine | Admitting: Emergency Medicine

## 2023-05-16 DIAGNOSIS — R42 Dizziness and giddiness: Secondary | ICD-10-CM | POA: Insufficient documentation

## 2023-05-16 DIAGNOSIS — Z794 Long term (current) use of insulin: Secondary | ICD-10-CM | POA: Insufficient documentation

## 2023-05-16 LAB — URINALYSIS, ROUTINE W REFLEX MICROSCOPIC
Bacteria, UA: NONE SEEN
Bilirubin Urine: NEGATIVE
Glucose, UA: >=500 mg/dL — AB
Hgb urine dipstick: NEGATIVE
Ketones, ur: 5 mg/dL — AB
Leukocytes,Ua: NEGATIVE
Nitrite: NEGATIVE
Protein, ur: NEGATIVE mg/dL
Specific Gravity, Urine: 1.026 (ref 1.005–1.030)
pH: 5 (ref 5.0–8.0)

## 2023-05-16 LAB — BASIC METABOLIC PANEL
Anion gap: 12 (ref 5–15)
BUN: 22 mg/dL — ABNORMAL HIGH (ref 6–20)
CO2: 23 mmol/L (ref 22–32)
Calcium: 9.4 mg/dL (ref 8.9–10.3)
Chloride: 103 mmol/L (ref 98–111)
Creatinine, Ser: 1.1 mg/dL (ref 0.61–1.24)
GFR, Estimated: >60 mL/min (ref >60)
Glucose, Bld: 243 mg/dL — ABNORMAL HIGH (ref 70–99)
Potassium: 3.9 mmol/L (ref 3.5–5.1)
Sodium: 138 mmol/L (ref 135–145)

## 2023-05-16 LAB — CBG MONITORING, ED: Glucose-Capillary: 240 mg/dL — ABNORMAL HIGH (ref 70–99)

## 2023-05-16 LAB — TROPONIN I (HIGH SENSITIVITY)
Troponin I (High Sensitivity): 3 ng/L (ref <18)
Troponin I (High Sensitivity): 3 ng/L (ref <18)

## 2023-05-16 LAB — CBC
HCT: 51.5 % (ref 39.0–52.0)
Hemoglobin: 16.1 g/dL (ref 13.0–17.0)
MCH: 25.6 pg — ABNORMAL LOW (ref 26.0–34.0)
MCHC: 31.3 g/dL (ref 30.0–36.0)
MCV: 81.7 fL (ref 80.0–100.0)
Platelets: 238 10*3/uL (ref 150–400)
RBC: 6.3 MIL/uL — ABNORMAL HIGH (ref 4.22–5.81)
RDW: 14 % (ref 11.5–15.5)
WBC: 6.9 10*3/uL (ref 4.0–10.5)
nRBC: 0 % (ref 0.0–0.2)

## 2023-05-16 MED ORDER — MECLIZINE HCL 25 MG PO TABS: 25.0000 mg | ORAL_TABLET | Freq: Three times a day (TID) | ORAL | 0 refills | Status: DC | PRN

## 2023-05-16 MED ORDER — SODIUM CHLORIDE 0.9 % IV BOLUS
1000.0000 mL | Freq: Once | INTRAVENOUS | Status: AC
  Administered 2023-05-16: 1000 mL via INTRAVENOUS

## 2023-05-16 MED ORDER — MECLIZINE HCL 25 MG PO TABS
25.0000 mg | ORAL_TABLET | Freq: Once | ORAL | Status: AC
  Administered 2023-05-16: 25 mg via ORAL
  Filled 2023-05-16: qty 1

## 2023-05-16 NOTE — ED Notes (Signed)
Pt provided with sandwich bag and soda. Denies further needs at this time.

## 2023-05-16 NOTE — ED Provider Triage Note (Signed)
Emergency Medicine Provider Triage Evaluation Note  Joshua Escobar , a 52 y.o. male  was evaluated in triage.  Pt complains of dizziness when standing x2d. No HA CP SOB  Review of Systems  Positive: As above Negative: As above  Physical Exam  BP 114/87 (BP Location: Right Arm)   Pulse 88   Temp 97.6 F (36.4 C)   Resp (!) 22   Ht 5\' 8"  (1.727 m)   Wt 73 kg   SpO2 100%   BMI 24.48 kg/m  Gen:   Awake, no distress   Resp:  Normal effort  MSK:   Moves extremities without difficulty  Other:    Medical Decision Making  Medically screening exam initiated at 11:14 AM.  Appropriate orders placed.  Joshua Escobar was informed that the remainder of the evaluation will be completed by another provider, this initial triage assessment does not replace that evaluation, and the importance of remaining in the ED until their evaluation is complete.     Royanne Foots, DO 05/16/23 1115

## 2023-05-16 NOTE — ED Triage Notes (Signed)
Pt. Stated, for the last 2 days Ive had dizziness when I go to stand up.

## 2023-05-16 NOTE — Discharge Instructions (Signed)
It was a pleasure taking care of you here in the emergency department  We have started on meclizine and medication to help with your dizziness  Take as prescribed  Return if you develop any new or worsening symptoms such as weakness, slurred speech, vision changes

## 2023-05-16 NOTE — ED Provider Notes (Signed)
[place] EMERGENCY DEPARTMENT AT [facility] Provider Note   CSN: [id number] Arrival date & time: 05/16/23  7829     History  Chief Complaint  Patient presents with   Dizziness    Joshua Escobar is a 52 y.o. male here for evaluation of dizziness.  Started 2 days ago.  He has a difficult time describing it.  Does have some sort of positional component.  No headache, neck pain, numbness, weakness, chest pain, shortness of breath, back pain, pain or swelling in extremities.  No history of similar.  No diplopia, vision changes.  No meds PTA. Hx of DM  HPI     Home Medications Prior to Admission medications   Medication Sig Start Date End Date Taking? Authorizing Provider  meclizine (ANTIVERT) 25 MG tablet Take 1 tablet (25 mg total) by mouth 3 (three) times daily as needed for dizziness. 05/16/23  Yes Jaymar Loeber A, PA-C  hydrocortisone (ANUSOL-HC) 25 MG suppository Place 1 suppository (25 mg total) rectally 2 (two) times daily. Patient not taking: No sig reported 09/28/20   Franky Macho, MD  hyoscyamine (LEVSIN SL) 0.125 MG SL tablet Place 1 tablet (0.125 mg total) under the tongue 4 (four) times daily as needed (abdominal pain). 11/16/20   Dolores Frame, MD  insulin aspart (NOVOLOG FLEXPEN) 100 UNIT/ML FlexPen Inject 12 Units into the skin 3 (three) times daily with meals. 09/11/20   Johnson, Clanford L, MD  insulin degludec (TRESIBA FLEXTOUCH) 100 UNIT/ML FlexTouch Pen Inject 50 Units into the skin at bedtime. 09/11/20   Johnson, Clanford L, MD  rosuvastatin (CRESTOR) 20 MG tablet Take 1 tablet (20 mg total) by mouth daily. Patient taking differently: Take 20 mg by mouth at bedtime. 05/05/20   Roma Kayser, MD      Allergies    Patient has no known allergies.    Review of Systems   Review of Systems  Constitutional: Negative.   HENT: Negative.    Respiratory: Negative.    Cardiovascular: Negative.   Gastrointestinal: Negative.    Genitourinary: Negative.   Musculoskeletal: Negative.   Skin: Negative.   Neurological:  Positive for dizziness.  All other systems reviewed and are negative.   Physical Exam Updated Vital Signs BP 129/89 (BP Location: Right Arm)   Pulse 87   Temp 98.3 F (36.8 C)   Resp 18   Ht 5\' 8"  (1.727 m)   Wt 73 kg   SpO2 100%   BMI 24.48 kg/m  Physical Exam Physical Exam  Constitutional: Pt is oriented to person, place, and time. Pt appears well-developed and well-nourished. No distress.  HENT:  Head: Normocephalic and atraumatic.  Mouth/Throat: Oropharynx is clear and moist.  Eyes: Conjunctivae and EOM are normal. Pupils are equal, round, and reactive to light. No scleral icterus.  No horizontal, vertical or rotational nystagmus  Neck: Normal range of motion. Neck supple.  Full active and passive ROM without pain No midline or paraspinal tenderness No nuchal rigidity or meningeal signs  Cardiovascular: Normal rate, regular rhythm and intact distal pulses.   Pulmonary/Chest: Effort normal and breath sounds normal. No respiratory distress. Pt has no wheezes. No rales.  Abdominal: Soft. Bowel sounds are normal. There is no tenderness. There is no rebound and no guarding.  Musculoskeletal: Normal range of motion.  Lymphadenopathy:    No cervical adenopathy.  Neurological: Pt. is alert and oriented to person, place, and time. He has normal reflexes. No cranial nerve deficit.  Exhibits normal muscle  tone. Coordination normal.  Mental Status:  Alert, oriented, thought content appropriate. Speech fluent without evidence of aphasia. Able to follow 2 step commands without difficulty.  Cranial Nerves:  2-12 grossly intact Motor:  Equal strength BIL Sensory: intact sensation BIL Cerebellar: normal finger-to-nose with bilateral upper extremities Gait: normal gait and balance CV: distal pulses palpable throughout   Skin: Skin is warm and dry. No rash noted. Pt is not diaphoretic.   Psychiatric: Pt has a normal mood and affect. Behavior is normal. Judgment and thought content normal.  Nursing note and vitals reviewed.  ED Results / Procedures / Treatments   Labs (all labs ordered are listed, but only abnormal results are displayed) Labs Reviewed  BASIC METABOLIC PANEL - Abnormal; Notable for the following components:      Result Value   Glucose, Bld 243 (*)    BUN 22 (*)    All other components within normal limits  CBC - Abnormal; Notable for the following components:   RBC 6.30 (*)    MCH 25.6 (*)    All other components within normal limits  URINALYSIS, ROUTINE W REFLEX MICROSCOPIC - Abnormal; Notable for the following components:   Glucose, UA >=500 (*)    Ketones, ur 5 (*)    All other components within normal limits  CBG MONITORING, ED - Abnormal; Notable for the following components:   Glucose-Capillary 240 (*)    All other components within normal limits  CBG MONITORING, ED  TROPONIN I (HIGH SENSITIVITY)  TROPONIN I (HIGH SENSITIVITY)    EKG EKG Interpretation Date/Time:  Wednesday May 16 2023 10:45:29 EST Ventricular Rate:  84 PR Interval:  132 QRS Duration:  90 QT Interval:  354 QTC Calculation: 418 R Axis:   77  Text Interpretation: Normal sinus rhythm Right atrial enlargement ST & T wave abnormality, consider inferolateral ischemia  Similar to prior EKGs Confirmed by Vivi Barrack [phone]) on 05/16/2023 6:43:02 PM  Radiology CT Head Wo Contrast Result Date: 05/16/2023 CLINICAL DATA:  Headache, dizziness EXAM: CT HEAD WITHOUT CONTRAST TECHNIQUE: Contiguous axial images were obtained from the base of the skull through the vertex without intravenous contrast. RADIATION DOSE REDUCTION: This exam was performed according to the departmental dose-optimization program which includes automated exposure control, adjustment of the mA and/or kV according to patient size and/or use of iterative reconstruction technique. COMPARISON:  11/14/2020  FINDINGS: Brain: No evidence of acute infarction, hemorrhage, mass, mass effect, or midline shift. No hydrocephalus or extra-axial fluid collection. Vascular: No hyperdense vessel. Skull: Negative for fracture or focal lesion. Sinuses/Orbits: No acute finding. Other: The mastoid air cells are well aerated. IMPRESSION: No acute intracranial process. Electronically Signed   By: Wiliam Ke M.D.   On: 05/16/2023 18:14    Procedures Procedures    Medications Ordered in ED Medications  sodium chloride 0.9 % bolus 1,000 mL (0 mLs Intravenous Stopped 05/16/23 1750)  meclizine (ANTIVERT) tablet 25 mg (25 mg Oral Given 05/16/23 1709)   ED Course/ Medical Decision Making/ A&P   52 year old here for evaluation of dizziness, worse with position changes.  No numbness, weakness, headache, chest pain, shortness of breath.  He has a nonfocal neuroexam without deficits.  No sudden onset thunderclap headache.  No meds PTA.  Plan on labs, imaging and reassess  Labs and imaging personally viewed and interpreted:  CBC without leukocytosis, hemoglobin 16 Metabolic panel mild hyperglycemia at 243, normal anion gap, no elevated bicarb low suspicion for DKA, HHS UA negative for infection Trop 3  EKG without ischemic changes CT head without significant abnormality  Patient reassessed.  He has a nonfocal neuroexam.  Ambulatory, tolerating p.o. today.  We discussed his labs and imaging.  Patient states he is feeling better, wanting to go home.  Dizziness improved with meclizine.  At this time I have low suspicion for acute ACS, PE, dissection, CVA, infectious process.  The patient has been appropriately medically screened and/or stabilized in the ED. I have low suspicion for any other emergent medical condition which would require further screening, evaluation or treatment in the ED or require inpatient management.  Patient is hemodynamically stable and in no acute distress.  Patient able to ambulate in  department prior to ED.  Evaluation does not show acute pathology that would require ongoing or additional emergent interventions while in the emergency department or further inpatient treatment.  I have discussed the diagnosis with the patient and answered all questions.  Pain is been managed while in the emergency department and patient has no further complaints prior to discharge.  Patient is comfortable with plan discussed in room and is stable for discharge at this time.  I have discussed strict return precautions for returning to the emergency department.  Patient was encouraged to follow-up with PCP/specialist refer to at discharge.                                  Medical Decision Making Amount and/or Complexity of Data Reviewed External Data Reviewed: labs, radiology, ECG and notes. Labs: ordered. Decision-making details documented in ED Course. Radiology: ordered and independent interpretation performed. Decision-making details documented in ED Course. ECG/medicine tests: ordered and independent interpretation performed. Decision-making details documented in ED Course.  Risk OTC drugs. Prescription drug management. Parenteral controlled substances. Decision regarding hospitalization. Diagnosis or treatment significantly limited by social determinants of health.         Final Clinical Impression(s) / ED Diagnoses Final diagnoses:  Dizziness    Rx / DC Orders ED Discharge Orders          Ordered    meclizine (ANTIVERT) 25 MG tablet  3 times daily PRN        05/16/23 1840              Cambria Osten A, PA-C 05/16/23 1846    Loetta Rough, MD 05/16/23 1919

## 2023-06-07 ENCOUNTER — Ambulatory Visit: Payer: Self-pay | Admitting: Family Medicine

## 2023-06-07 NOTE — Telephone Encounter (Signed)
 Copied from CRM [id number]. Topic: Clinical - Red Word Triage >> Jun 07, 2023 10:37 AM Elle L wrote: [facility] that prompted transfer to Nurse Triage: The patient was interested in scheduling with [facility] Medicine but he has burned his leg and is in pain.    Chief Complaint: Skin injury/suspected burn Symptoms: some skin came off right knee. Frequency: Since yesterday Disposition: [] ED /[] Urgent Care (no appt availability in office) / [x] Appointment(In office/virtual)/ []  [place] [facility]/ [] Home Care/ [] Refused Recommended Disposition /[] [facility]/ []  Follow-up with PCP  Additional Notes: Patient stated he thinks he has a burn on his right knee. He stated that it resembles a burn, but he is not sure how it got there. He noticed it when he got undressed after work. Patient stated that he was not working with chemicals, and he does not recall anything coming in contact with his knee. He has some pain, level 5/10. Patient requesting appointment. Appointment scheduled for tomorrow morning 1/10.    Answer Assessment - Initial Assessment Questions 1. APPEARANCE of INJURY: What does the injury look like?      Looks like a burn, some skin came off  2. SIZE: How large is the cut?      Covers the knee  3. BLEEDING: Is it bleeding now? If Yes, ask: Is it difficult to stop?      A little bit of blood was there. (Not currently draining or oozing blood)  4. LOCATION: Where is the injury located?      Right knee  5. ONSET: How long ago did the injury occur?      Yesterday  6. MECHANISM: Tell me how it happened.      Unknown  Protocols used: Skin Injury-A-AH

## 2023-06-08 ENCOUNTER — Encounter: Payer: Self-pay | Admitting: Family Medicine

## 2023-06-08 ENCOUNTER — Ambulatory Visit (INDEPENDENT_AMBULATORY_CARE_PROVIDER_SITE_OTHER): Payer: Self-pay | Admitting: Family Medicine

## 2023-06-08 VITALS — BP 118/72 | HR 104 | Temp 98.2°F | Ht 68.0 in | Wt 151.6 lb

## 2023-06-08 DIAGNOSIS — Z794 Long term (current) use of insulin: Secondary | ICD-10-CM

## 2023-06-08 DIAGNOSIS — E1165 Type 2 diabetes mellitus with hyperglycemia: Secondary | ICD-10-CM

## 2023-06-08 DIAGNOSIS — L03115 Cellulitis of right lower limb: Secondary | ICD-10-CM

## 2023-06-08 MED ORDER — SULFAMETHOXAZOLE-TRIMETHOPRIM 800-160 MG PO TABS: 1.0000 | ORAL_TABLET | Freq: Two times a day (BID) | ORAL | 0 refills | Status: DC

## 2023-06-08 NOTE — Progress Notes (Signed)
 Subjective:    Patient ID: Joshua Escobar, male    DOB: 24-Jul-1970, 53 y.o.   MRN: [id number]  HPI Patient recently moved to the area from [place] .  He is not currently seeing a local physician.  His doctor in [place]  has him on 25 units of the long-acting insulin  twice daily and 15 units of the short acting insulin  with meals although the patient is not sure of the name.  I am uncertain as to how well-controlled his blood sugar.  The patient states that he developed a blister on the surface of his right knee.  On Wednesday it ruptured and produced what sounds like clear and yellow serous material.  Now the skin around the open blister has become erythematous and painful and hot to the touch. On the knee itself is a wound 5 cm x 4 cm with the underlying subcutaneous fat exposed.  The surrounding skin is erythematous warm and painful. Past Medical History:  Diagnosis Date   MI (myocardial infarction) (HCC) 10/2019   Migraine headache    Type II diabetes mellitus (HCC)    Past Surgical History:  Procedure Laterality Date   BIOPSY  11/16/2020   Procedure: BIOPSY;  Surgeon: Eartha Angelia Sieving, MD;  Location: [facility];  Service: Gastroenterology;;   COLONOSCOPY WITH PROPOFOL  N/A 09/11/2020   Procedure: COLONOSCOPY WITH PROPOFOL ;  Surgeon: Eartha Angelia Sieving, MD;  Location: [facility];  Service: Gastroenterology;  Laterality: N/A;   ESOPHAGOGASTRODUODENOSCOPY (EGD) WITH PROPOFOL  N/A 11/16/2020   Procedure: ESOPHAGOGASTRODUODENOSCOPY (EGD) WITH PROPOFOL ;  Surgeon: Eartha Angelia Sieving, MD;  Location: [facility];  Service: Gastroenterology;  Laterality: N/A;  12:10   LEFT HEART CATH AND CORONARY ANGIOGRAPHY N/A 11/27/2016   Procedure: Left Heart Cath and Coronary Angiography;  Surgeon: Court Dorn PARAS, MD;  Location: [facility] [place] INVASIVE CV LAB;  Service: Cardiovascular;  Laterality: N/A;   Current Outpatient Medications on File Prior to Visit  Medication Sig Dispense Refill    hyoscyamine  (LEVSIN  SL) 0.125 MG SL tablet Place 1 tablet (0.125 mg total) under the tongue 4 (four) times daily as needed (abdominal pain). 90 tablet 2   insulin  aspart (NOVOLOG  FLEXPEN) 100 UNIT/ML FlexPen Inject 12 Units into the skin 3 (three) times daily with meals. 15 mL 1   meclizine  (ANTIVERT ) 25 MG tablet Take 1 tablet (25 mg total) by mouth 3 (three) times daily as needed for dizziness. 30 tablet 0   rosuvastatin  (CRESTOR ) 20 MG tablet Take 1 tablet (20 mg total) by mouth daily. (Patient taking differently: Take 20 mg by mouth at bedtime.) 90 tablet 1   hydrocortisone  (ANUSOL -HC) 25 MG suppository Place 1 suppository (25 mg total) rectally 2 (two) times daily. (Patient not taking: Reported on 10/13/2020) 12 suppository 2   No current facility-administered medications on file prior to visit.   No Known Allergies Social History   Socioeconomic History   Marital status: Single    Spouse name: Not on file   Number of children: 2   Years of education: 12   Highest education level: GED or equivalent  Occupational History   Occupation: Naval Architect   Occupation: BUILF ATM MACHIN[employer]    Employer: [employer]  Tobacco Use   Smoking status: Every Day    Current packs/day: 0.25    Average packs/day: 0.3 packs/day for 20.0 years (5.0 ttl pk-yrs)    Types: Cigarettes   Smokeless tobacco: Never  Vaping Use   Vaping status: Former   Substances: Nicotine, Flavoring  Substance and Sexual Activity   Alcohol use: Yes    Alcohol/week: 1.0 standard drink of alcohol    Types: 1 Cans of beer per week    Comment: weekends only   Drug use: No   Sexual activity: Not Currently  Other Topics Concern   Not on file  Social History Narrative   Fun/Hobby: Basketball, fish, bowling.    Social Drivers of Corporate Investment Banker Strain: Low Risk  (06/08/2023)   Overall Financial Resource Strain (CARDIA)    Difficulty of Paying Living Expenses: Not hard at all  Food Insecurity: Food  Insecurity Present (06/08/2023)   Hunger Vital Sign    Worried About Running Out of Food in the Last Year: Sometimes true    Ran Out of Food in the Last Year: Sometimes true  Transportation Needs: No Transportation Needs (06/08/2023)   PRAPARE - Administrator, Civil Service (Medical): No    Lack of Transportation (Non-Medical): No  Physical Activity: Sufficiently Active (06/08/2023)   Exercise Vital Sign    Days of Exercise per Week: 7 days    Minutes of Exercise per Session: 60 min  Stress: No Stress Concern Present (06/08/2023)   [employer] of Occupational Health - Occupational Stress Questionnaire    Feeling of Stress : Not at all  Social Connections: Socially Isolated (06/08/2023)   Social Connection and Isolation Panel [NHANES]    Frequency of Communication with Friends and Family: More than three times a week    Frequency of Social Gatherings with Friends and Family: More than three times a week    Attends Religious Services: Never    Database Administrator or Organizations: No    Attends Engineer, Structural: Not on file    Marital Status: Divorced  Intimate Partner Violence: Not At Risk (01/24/2023)   Received from [employer], Afraid, Rape, and Kick questionnaire    Fear of Current or Ex-Partner: No    Emotionally Abused: No    Physically Abused: No    Sexually Abused: No        Review of Systems     Objective:   Physical Exam Vitals reviewed.  Constitutional:      Appearance: Normal appearance. He is normal weight.  Cardiovascular:     Rate and Rhythm: Normal rate and regular rhythm.     Heart sounds: Normal heart sounds.  Pulmonary:     Effort: Pulmonary effort is normal.     Breath sounds: Normal breath sounds.  Musculoskeletal:       Legs:  Skin:    Findings: Erythema and lesion present.  Neurological:     Mental Status: He is alert.           Assessment & Plan:  Type 2 diabetes mellitus with  hyperglycemia, with long-term current use of insulin  (HCC) - Plan: Hemoglobin A1c, BASIC METABOLIC PANEL WITH GFR, CBC with Differential/Platelet  Cellulitis of right lower extremity Based on the description, I believe the patient had a large blister that formed on the surface of his knee that then ruptured and has become secondarily infected.  Begin Bactrim  double strength tablets twice daily for 7 days and recheck next week.  Obtain baseline lab work including a CBC a BMP and a hemoglobin A1c.  Seek medical attention immediately if worsening.

## 2023-06-09 LAB — CBC WITH DIFFERENTIAL/PLATELET
Absolute Lymphocytes: 2520 {cells}/uL (ref 850–3900)
Absolute Monocytes: 743 {cells}/uL (ref 200–950)
Basophils Absolute: 38 {cells}/uL (ref 0–200)
Basophils Relative: 0.5 %
Eosinophils Absolute: 173 {cells}/uL (ref 15–500)
Eosinophils Relative: 2.3 %
HCT: 50 % (ref 38.5–50.0)
Hemoglobin: 14.9 g/dL (ref 13.2–17.1)
MCH: 24.9 pg — ABNORMAL LOW (ref 27.0–33.0)
MCHC: 29.8 g/dL — ABNORMAL LOW (ref 32.0–36.0)
MCV: 83.5 fL (ref 80.0–100.0)
MPV: 12.1 fL (ref 7.5–12.5)
Monocytes Relative: 9.9 %
Neutro Abs: 4028 {cells}/uL (ref 1500–7800)
Neutrophils Relative %: 53.7 %
Platelets: 246 10*3/uL (ref 140–400)
RBC: 5.99 10*6/uL — ABNORMAL HIGH (ref 4.20–5.80)
RDW: 12.5 % (ref 11.0–15.0)
Total Lymphocyte: 33.6 %
WBC: 7.5 10*3/uL (ref 3.8–10.8)

## 2023-06-09 LAB — BASIC METABOLIC PANEL WITH GFR
BUN: 13 mg/dL (ref 7–25)
CO2: 28 mmol/L (ref 20–32)
Calcium: 9.8 mg/dL (ref 8.6–10.3)
Chloride: 100 mmol/L (ref 98–110)
Creat: 1.16 mg/dL (ref 0.70–1.30)
Glucose, Bld: 521 mg/dL (ref 65–99)
Potassium: 4.9 mmol/L (ref 3.5–5.3)
Sodium: 139 mmol/L (ref 135–146)
eGFR: 76 mL/min/{1.73_m2} (ref >=60)

## 2023-06-09 LAB — HEMOGLOBIN A1C: Hgb A1c MFr Bld: >14 %{Hb} — ABNORMAL HIGH (ref <5.7)

## 2023-06-11 ENCOUNTER — Encounter: Payer: Self-pay | Admitting: Family Medicine

## 2023-06-12 ENCOUNTER — Telehealth: Payer: Self-pay | Admitting: Physician Assistant

## 2023-06-12 DIAGNOSIS — L03115 Cellulitis of right lower limb: Secondary | ICD-10-CM

## 2023-06-12 NOTE — Progress Notes (Signed)
 Because you are having progressive symptoms despite being prescribed antibiotics a few days ago from your PCP office, I feel your condition warrants further evaluation and I recommend that you be seen in a face to face visit. I recommend you give them a call for reassessment.    NOTE: There will be NO CHARGE for this E-Visit   If you are having a true medical emergency please call 911.     For an urgent face to face visit, [place] has multiple urgent care centers for your convenience.   [facility] at [facility] Your location to [facility]gent Care at [facility] [phone] [address] [place], [place] [address]   Urgent Care at [facility] [place], [place]  [place] Your location to [facility] Urgent Care at [facility] [phone] [address] [place],  [place]  [address]  [facility] Urgent Care at [facility]) Get Driving Directions [phone] [address] [place], [address]    [facility] Urgent Care Center at [facility] Get Driving Directions [phone] [address] [place], [place] [address]   [facility] Urgent [facility]) Get Driving Directions [phone] [address] [place], [place] [address]  [facility] Urgent Care Center [facility]) Get Driving Directions [phone] [address] [place],  [place]  [address]  [facility] Urgent Care Center [facility] - at [employer]  [phone] [address] [place],  [place] [address]   [facility] Urgent Care at [facility] Get Driving Directions [phone] [address] [place] [place], [place] [address]   [facility] Urgent Care at [facility] Get Driving Directions  [phone] [address] [place], [place]  [address]   [facility] Urgent Care at [facility] Directions [phone] [address], [address] [place], [place] [address]  Your MyChart E-visit questionnaire answers were reviewed by a board certified advanced clinical practitioner to complete your personal care plan based on your specific symptoms.  Thank you for using e-Visits.

## 2023-06-13 ENCOUNTER — Emergency Department (HOSPITAL_COMMUNITY): Payer: Self-pay

## 2023-06-13 ENCOUNTER — Encounter (HOSPITAL_COMMUNITY): Payer: Self-pay | Admitting: Emergency Medicine

## 2023-06-13 ENCOUNTER — Other Ambulatory Visit: Payer: Self-pay

## 2023-06-13 ENCOUNTER — Ambulatory Visit: Payer: Self-pay | Admitting: Family Medicine

## 2023-06-13 ENCOUNTER — Observation Stay (HOSPITAL_COMMUNITY)
Admission: EM | Admit: 2023-06-13 | Discharge: 2023-06-14 | Disposition: A | Discharge status: Home / Self Care | Payer: Self-pay | Attending: Internal Medicine | Admitting: Internal Medicine

## 2023-06-13 DIAGNOSIS — W19XXXA Unspecified fall, initial encounter: Secondary | ICD-10-CM | POA: Insufficient documentation

## 2023-06-13 DIAGNOSIS — Z87891 Personal history of nicotine dependence: Secondary | ICD-10-CM | POA: Insufficient documentation

## 2023-06-13 DIAGNOSIS — Z720 Tobacco use: Secondary | ICD-10-CM | POA: Diagnosis present

## 2023-06-13 DIAGNOSIS — Z79899 Other long term (current) drug therapy: Secondary | ICD-10-CM | POA: Insufficient documentation

## 2023-06-13 DIAGNOSIS — R739 Hyperglycemia, unspecified: Secondary | ICD-10-CM

## 2023-06-13 DIAGNOSIS — G9340 Encephalopathy, unspecified: Principal | ICD-10-CM | POA: Insufficient documentation

## 2023-06-13 DIAGNOSIS — R400 Somnolence: Principal | ICD-10-CM | POA: Insufficient documentation

## 2023-06-13 DIAGNOSIS — G9341 Metabolic encephalopathy: Secondary | ICD-10-CM

## 2023-06-13 DIAGNOSIS — E1165 Type 2 diabetes mellitus with hyperglycemia: Secondary | ICD-10-CM | POA: Insufficient documentation

## 2023-06-13 DIAGNOSIS — I10 Essential (primary) hypertension: Secondary | ICD-10-CM | POA: Insufficient documentation

## 2023-06-13 DIAGNOSIS — S81001A Unspecified open wound, right knee, initial encounter: Secondary | ICD-10-CM | POA: Insufficient documentation

## 2023-06-13 DIAGNOSIS — E785 Hyperlipidemia, unspecified: Secondary | ICD-10-CM | POA: Insufficient documentation

## 2023-06-13 LAB — COMPREHENSIVE METABOLIC PANEL
ALT: 16 U/L (ref 0–44)
AST: 19 U/L (ref 15–41)
Albumin: 3.9 g/dL (ref 3.5–5.0)
Alkaline Phosphatase: 100 U/L (ref 38–126)
Anion gap: 9 (ref 5–15)
BUN: 19 mg/dL (ref 6–20)
CO2: 24 mmol/L (ref 22–32)
Calcium: 9.2 mg/dL (ref 8.9–10.3)
Chloride: 97 mmol/L — ABNORMAL LOW (ref 98–111)
Creatinine, Ser: 1.01 mg/dL (ref 0.61–1.24)
GFR, Estimated: >60 mL/min (ref >60)
Glucose, Bld: 492 mg/dL — ABNORMAL HIGH (ref 70–99)
Potassium: 5.2 mmol/L — ABNORMAL HIGH (ref 3.5–5.1)
Sodium: 130 mmol/L — ABNORMAL LOW (ref 135–145)
Total Bilirubin: 0.8 mg/dL (ref 0.0–1.2)
Total Protein: 7.8 g/dL (ref 6.5–8.1)

## 2023-06-13 LAB — CBG MONITORING, ED
Glucose-Capillary: 241 mg/dL — ABNORMAL HIGH (ref 70–99)
Glucose-Capillary: 386 mg/dL — ABNORMAL HIGH (ref 70–99)
Glucose-Capillary: 566 mg/dL (ref 70–99)

## 2023-06-13 LAB — CBC WITH DIFFERENTIAL/PLATELET
Abs Immature Granulocytes: 0.02 10*3/uL (ref 0.00–0.07)
Basophils Absolute: 0.1 10*3/uL (ref 0.0–0.1)
Basophils Relative: 1 %
Eosinophils Absolute: 0.1 10*3/uL (ref 0.0–0.5)
Eosinophils Relative: 2 %
HCT: 48.1 % (ref 39.0–52.0)
Hemoglobin: 15.5 g/dL (ref 13.0–17.0)
Immature Granulocytes: 0 %
Lymphocytes Relative: 25 %
Lymphs Abs: 1.9 10*3/uL (ref 0.7–4.0)
MCH: 25.6 pg — ABNORMAL LOW (ref 26.0–34.0)
MCHC: 32.2 g/dL (ref 30.0–36.0)
MCV: 79.5 fL — ABNORMAL LOW (ref 80.0–100.0)
Monocytes Absolute: 0.6 10*3/uL (ref 0.1–1.0)
Monocytes Relative: 8 %
Neutro Abs: 4.9 10*3/uL (ref 1.7–7.7)
Neutrophils Relative %: 64 %
Platelets: 267 10*3/uL (ref 150–400)
RBC: 6.05 MIL/uL — ABNORMAL HIGH (ref 4.22–5.81)
RDW: 13.3 % (ref 11.5–15.5)
WBC: 7.6 10*3/uL (ref 4.0–10.5)
nRBC: 0 % (ref 0.0–0.2)

## 2023-06-13 LAB — URINALYSIS, W/ REFLEX TO CULTURE (INFECTION SUSPECTED)
Bacteria, UA: NONE SEEN
Bilirubin Urine: NEGATIVE
Glucose, UA: >=500 mg/dL — AB
Hgb urine dipstick: NEGATIVE
Ketones, ur: NEGATIVE mg/dL
Leukocytes,Ua: NEGATIVE
Nitrite: NEGATIVE
Protein, ur: NEGATIVE mg/dL
Specific Gravity, Urine: 1.025 (ref 1.005–1.030)
pH: 5 (ref 5.0–8.0)

## 2023-06-13 LAB — RAPID URINE DRUG SCREEN, HOSP PERFORMED
Amphetamines: NOT DETECTED
Barbiturates: NOT DETECTED
Benzodiazepines: NOT DETECTED
Cocaine: NOT DETECTED
Opiates: NOT DETECTED
Tetrahydrocannabinol: NOT DETECTED

## 2023-06-13 LAB — BLOOD GAS, VENOUS
Acid-Base Excess: 1.2 mmol/L (ref 0.0–2.0)
Bicarbonate: 27.2 mmol/L (ref 20.0–28.0)
Drawn by: 68716
O2 Saturation: 60.3 %
Patient temperature: 36.6
pCO2, Ven: 46 mm[Hg] (ref 44–60)
pH, Ven: 7.38 (ref 7.25–7.43)
pO2, Ven: <31 mm[Hg] — CL (ref 32–45)

## 2023-06-13 LAB — ETHANOL: Alcohol, Ethyl (B): <10 mg/dL (ref <10)

## 2023-06-13 LAB — BETA-HYDROXYBUTYRIC ACID: Beta-Hydroxybutyric Acid: 0.93 mmol/L — ABNORMAL HIGH (ref 0.05–0.27)

## 2023-06-13 MED ORDER — INSULIN ASPART 100 UNIT/ML IJ SOLN
10.0000 [IU] | Freq: Once | INTRAMUSCULAR | Status: AC
  Administered 2023-06-13: 10 [IU] via SUBCUTANEOUS
  Filled 2023-06-13: qty 1

## 2023-06-13 MED ORDER — MELATONIN 3 MG PO TABS: 6.0000 mg | ORAL_TABLET | Freq: Every evening | ORAL | Status: DC | PRN

## 2023-06-13 MED ORDER — ACETAMINOPHEN 500 MG PO TABS
1000.0000 mg | ORAL_TABLET | Freq: Once | ORAL | Status: AC
  Administered 2023-06-13: 1000 mg via ORAL
  Filled 2023-06-13: qty 2

## 2023-06-13 MED ORDER — INSULIN ASPART 100 UNIT/ML IJ SOLN
0.0000 [IU] | Freq: Three times a day (TID) | INTRAMUSCULAR | Status: DC
  Administered 2023-06-14: 5 [IU] via SUBCUTANEOUS
  Filled 2023-06-13: qty 1

## 2023-06-13 MED ORDER — SODIUM CHLORIDE 0.9 % IV BOLUS
1000.0000 mL | Freq: Once | INTRAVENOUS | Status: AC
  Administered 2023-06-13: 1000 mL via INTRAVENOUS

## 2023-06-13 MED ORDER — ALBUTEROL SULFATE (2.5 MG/3ML) 0.083% IN NEBU: 2.5000 mg | INHALATION_SOLUTION | RESPIRATORY_TRACT | Status: DC | PRN

## 2023-06-13 MED ORDER — ENOXAPARIN SODIUM 40 MG/0.4ML IJ SOSY
40.0000 mg | PREFILLED_SYRINGE | INTRAMUSCULAR | Status: DC
  Administered 2023-06-14: 40 mg via SUBCUTANEOUS
  Filled 2023-06-13: qty 0.4

## 2023-06-13 MED ORDER — INSULIN GLARGINE-YFGN 100 UNIT/ML ~~LOC~~ SOLN
25.0000 [IU] | Freq: Two times a day (BID) | SUBCUTANEOUS | Status: DC
  Administered 2023-06-13: 25 [IU] via SUBCUTANEOUS
  Filled 2023-06-13 (×4): qty 0.25

## 2023-06-13 MED ORDER — ACETAMINOPHEN 500 MG PO TABS: 1000.0000 mg | ORAL_TABLET | Freq: Four times a day (QID) | ORAL | Status: DC | PRN

## 2023-06-13 MED ORDER — INSULIN ASPART 100 UNIT/ML IJ SOLN
10.0000 [IU] | Freq: Three times a day (TID) | INTRAMUSCULAR | Status: DC
  Administered 2023-06-14: 10 [IU] via SUBCUTANEOUS
  Filled 2023-06-13: qty 1

## 2023-06-13 MED ORDER — SODIUM CHLORIDE 0.9% FLUSH
3.0000 mL | Freq: Two times a day (BID) | INTRAVENOUS | Status: DC
  Administered 2023-06-14: 3 mL via INTRAVENOUS

## 2023-06-13 MED ORDER — PROCHLORPERAZINE EDISYLATE 10 MG/2ML IJ SOLN
10.0000 mg | Freq: Once | INTRAMUSCULAR | Status: AC
  Administered 2023-06-13: 10 mg via INTRAVENOUS
  Filled 2023-06-13: qty 2

## 2023-06-13 MED ORDER — DIPHENHYDRAMINE HCL 50 MG/ML IJ SOLN
25.0000 mg | Freq: Once | INTRAMUSCULAR | Status: AC
  Administered 2023-06-13: 25 mg via INTRAVENOUS
  Filled 2023-06-13: qty 1

## 2023-06-13 MED ORDER — ONDANSETRON HCL 4 MG/2ML IJ SOLN: 4.0000 mg | Freq: Four times a day (QID) | INTRAMUSCULAR | Status: DC | PRN

## 2023-06-13 MED ORDER — POLYETHYLENE GLYCOL 3350 17 G PO PACK: 17.0000 g | PACK | Freq: Every day | ORAL | Status: DC | PRN

## 2023-06-13 MED ORDER — LACTATED RINGERS IV BOLUS
1000.0000 mL | Freq: Once | INTRAVENOUS | Status: AC
  Administered 2023-06-13: 1000 mL via INTRAVENOUS

## 2023-06-13 NOTE — Telephone Encounter (Signed)
  Chief Complaint: R leg edema, fever Symptoms: pain, fever, swelling, redness Frequency: Yesterday Pertinent Negatives: Patient denies CP, SOB Disposition: [x] ED /[] Urgent Care (no appt availability in office) / [] Appointment(In office/virtual)/ []  [facility]/ [] Home Care/ [] Refused Recommended Disposition /[] [facility] [facility]/ []  Follow-up with PCP Additional Notes: Patient's family member, Adriana Hopping calls with patient on the line providing permission to speak with [facility] for triage. She reports patient was seen last week for RLE edema and is being treated for an infection with antibiotics. She reports he has swelling and redness from the R knee down to his foot, states "it looks burned". She reports that beginning last night the patient has become febrile at 102.104F- has not rechecked this morning. She reports the leg is painful to touch and very warm. Per protocol, patient to be evaluated in ED. Caller states patient will go now for evaluation. Care advice reviewed, understanding verbalized. Alerting PCP for review.   Copied from CRM [id number]. Topic: Clinical - Red Word Triage >> Jun 13, 2023  9:33 AM Elle L wrote: Red Word that prompted transfer to Nurse Triage: Joshua Escobar called on behalf of the patient as his leg is swelling and his running a 102 fever. Reason for Disposition  [1] Swelling is painful to touch AND [2] fever  Answer Assessment - Initial Assessment Questions 1. ONSET: "When did the swelling start?" (e.g., minutes, hours, days)     Approx 06/09/23 2. LOCATION: "What part of the leg is swollen?"  "Are both legs swollen or just one leg?"     R leg from Knee down 3. SEVERITY: "How bad is the swelling?" (e.g., localized; mild, moderate, severe)   - Localized: Small area of swelling localized to one leg.   - MILD pedal edema: Swelling limited to foot and ankle, pitting edema < 1/4 inch (6 mm) deep, rest and elevation eliminate most or all swelling.   - MODERATE edema:  Swelling of lower leg to knee, pitting edema > 1/4 inch (6 mm) deep, rest and elevation only partially reduce swelling.   - SEVERE edema: Swelling extends above knee, facial or hand swelling present.      Moderate 4. REDNESS: "Does the swelling look red or infected?"     Red- PCP said it is infected at the last visit 5. PAIN: "Is the swelling painful to touch?" If Yes, ask: "How painful is it?"   (Scale 1-10; mild, moderate or severe)     Caller reports yes, also with movement 6. FEVER: "Do you have a fever?" If Yes, ask: "What is it, how was it measured, and when did it start?"      Fever began yesterday, caller reports 102.104F.  7. CAUSE: "What do you think is causing the leg swelling?"     Infection 8. MEDICAL HISTORY: "Do you have a history of blood clots (e.g., DVT), cancer, heart failure, kidney disease, or liver failure?"     Denies 9. RECURRENT SYMPTOM: "Have you had leg swelling before?" If Yes, ask: "When was the last time?" "What happened that time?"     A few months ago, caller unsure 10. OTHER SYMPTOMS: "Do you have any other symptoms?" (e.g., chest pain, difficulty breathing)       Denies  Protocols used: Leg Swelling and Edema-A-AH

## 2023-06-13 NOTE — ED Provider Notes (Signed)
[place] EMERGENCY DEPARTMENT AT [facility] Provider Note   CSN: [id number] Arrival date & time: 06/13/23  1641     History  Chief Complaint  Patient presents with   Knee Pain   Skin Ulcer    Joshua Escobar is a 53 y.o. male with T2DM w/ h/o PMH as listed below who presents with somnolence, hyperglycemia, and ulcer to right knee. History largely provided by ex-girlfriend at bedside. She states that for the last 5-6 days patient has been extremely sleepy. She has had a difficult time waking him and has had to even pick him up and carry him sometimes to the bathroom. He has diabetes and she has also noted his sugar has been high 300-500 which improves for a short time after insulin but then goes too high again. She stated his right knee skin "peeled open" 06/06/2023. He went to the MD and was given antibiotics last Wednesday, then had nausea/vomiting after that. Approximately four days into treatment w/ abx. This morning his pain was 7/10 in the knee and a 10/10 in his lower back. Patient also reported to triage nurse nausea with vague blurry vision and headache rated 10/10. He at this time endorses headache but states he has no blurry vision. Was gradual onset, no maximum at onset. No numbness/tingling, asymmetric weakness. No other household members with headache.  Past Medical History:  Diagnosis Date   MI (myocardial infarction) (HCC) 10/2019   Migraine headache    Type II diabetes mellitus (HCC)        Home Medications Prior to Admission medications   Medication Sig Start Date End Date Taking? Authorizing Provider  meclizine (ANTIVERT) 25 MG tablet Take 1 tablet (25 mg total) by mouth 3 (three) times daily as needed for dizziness. 05/16/23  Yes Henderly, Britni A, PA-C  sulfamethoxazole-trimethoprim (BACTRIM DS) 800-160 MG tablet Take 1 tablet by mouth 2 (two) times daily. 06/08/23  Yes Donita Brooks, MD      Allergies    Patient has no known allergies.     Review of Systems   Review of Systems A 10 point review of systems was performed and is negative unless otherwise reported in HPI.  Physical Exam Updated Vital Signs BP 92/73 (BP Location: Right Arm)   Pulse 74   Temp 97.7 F (36.5 C) (Oral)   Resp 13   Ht 5\' 8"  (1.727 m)   Wt 68.5 kg   SpO2 97%   BMI 22.96 kg/m  Physical Exam General: Normal appearing male, lying in bed.  HEENT: PERRLA, Sclera anicteric, MMM, trachea midline.  Cardiology: RRR, no murmurs/rubs/gallops. BL radial and DP pulses equal bilaterally.  Resp: Normal respiratory rate and effort. CTAB, no wheezes, rhonchi, crackles.  Abd: Soft, non-tender, non-distended. No rebound tenderness or guarding.  GU: Deferred. MSK: Patient with large healing ulcer encompassing the anterior right knee.  No joint effusion.  No significant swelling, erythema, induration, fluctuance.  No draining purulence.  No peripheral edema or signs of trauma. Extremities without deformity or TTP. No cyanosis or clubbing. Skin: warm, dry.  Back: No CVA tenderness Neuro: somnolent, requiring constant attention to stay awake.  Eyes closed but opens them to verbal stimulation.  Answers questions but is at times a poor historian.  CNs II-XII grossly intact.  No facial droop.  Tongue protrudes midline.  5 out of 5 strength in all extremities.. Sensation grossly intact.   ED Results / Procedures / Treatments   Labs (all labs ordered are  listed, but only abnormal results are displayed) Labs Reviewed  CBC WITH DIFFERENTIAL/PLATELET - Abnormal; Notable for the following components:      Result Value   RBC 6.05 (*)    MCV 79.5 (*)    MCH 25.6 (*)    All other components within normal limits  COMPREHENSIVE METABOLIC PANEL - Abnormal; Notable for the following components:   Sodium 130 (*)    Potassium 5.2 (*)    Chloride 97 (*)    Glucose, Bld 492 (*)    All other components within normal limits  BETA-HYDROXYBUTYRIC ACID - Abnormal; Notable for  the following components:   Beta-Hydroxybutyric Acid 0.93 (*)    All other components within normal limits  BLOOD GAS, VENOUS - Abnormal; Notable for the following components:   pO2, Ven <31 (*)    All other components within normal limits  URINALYSIS, W/ REFLEX TO CULTURE (INFECTION SUSPECTED) - Abnormal; Notable for the following components:   Color, Urine STRAW (*)    Glucose, UA >=500 (*)    All other components within normal limits  CBG MONITORING, ED - Abnormal; Notable for the following components:   Glucose-Capillary 566 (*)    All other components within normal limits  CBG MONITORING, ED - Abnormal; Notable for the following components:   Glucose-Capillary 386 (*)    All other components within normal limits  ETHANOL  RAPID URINE DRUG SCREEN, HOSP PERFORMED    EKG EKG Interpretation Date/Time:  Wednesday June 13 2023 17:11:03 EST Ventricular Rate:  88 PR Interval:  129 QRS Duration:  83 QT Interval:  345 QTC Calculation: 418 R Axis:   76  Text Interpretation: Sinus rhythm Biatrial enlargement Minimal ST depression, diffuse leads Borderline ST elevation, anterior leads  When compared to prior EKG, STDs in inferior leads have improved Confirmed by Vivi Barrack [phone]) on 06/13/2023 5:13:43 PM  Radiology CT Head Wo Contrast Result Date: 06/13/2023 CLINICAL DATA:  Headache, increasing frequency or severity. Pt c/o posterior headache EXAM: CT HEAD WITHOUT CONTRAST TECHNIQUE: Contiguous axial images were obtained from the base of the skull through the vertex without intravenous contrast. RADIATION DOSE REDUCTION: This exam was performed according to the departmental dose-optimization program which includes automated exposure control, adjustment of the mA and/or kV according to patient size and/or use of iterative reconstruction technique. COMPARISON:  Ct 05/16/23 FINDINGS: Brain: No evidence of large-territorial acute infarction. No parenchymal hemorrhage. No mass lesion. No  extra-axial collection. No mass effect or midline shift. No hydrocephalus. Basilar cisterns are patent. Vascular: No hyperdense vessel. Skull: No acute fracture or focal lesion. Sinuses/Orbits: Paranasal sinuses and mastoid air cells are clear. The orbits are unremarkable. Other: None. IMPRESSION: No acute intracranial abnormality. Electronically Signed   By: Tish Frederickson M.D.   On: 06/13/2023 19:15    Procedures .Critical Care  Performed by: Loetta Rough, MD Authorized by: Loetta Rough, MD   Critical care provider statement:    Critical care time (minutes):  30   Critical care was necessary to treat or prevent imminent or life-threatening deterioration of the following conditions:  Endocrine crisis and dehydration   Critical care was time spent personally by me on the following activities:  Development of treatment plan with patient or surrogate, evaluation of patient's response to treatment, examination of patient, ordering and review of laboratory studies, ordering and review of radiographic studies, ordering and performing treatments and interventions, pulse oximetry, re-evaluation of patient's condition, review of old charts and obtaining history from patient  or surrogate   Care discussed with: admitting provider       Medications Ordered in ED Medications  lactated ringers bolus 1,000 mL (1,000 mLs Intravenous New Bag/Given 06/13/23 1741)  acetaminophen (TYLENOL) tablet 1,000 mg (1,000 mg Oral Given 06/13/23 1748)  prochlorperazine (COMPAZINE) injection 10 mg (10 mg Intravenous Given 06/13/23 1751)  diphenhydrAMINE (BENADRYL) injection 25 mg (25 mg Intravenous Given 06/13/23 1750)  insulin aspart (novoLOG) injection 10 Units (10 Units Subcutaneous Given 06/13/23 1935)    ED Course/ Medical Decision Making/ A&P                          Medical Decision Making Amount and/or Complexity of Data Reviewed Labs: ordered. Decision-making details documented in ED Course. Radiology:  ordered. Decision-making details documented in ED Course.  Risk OTC drugs. Prescription drug management.    This patient presents to the ED for concern of somnolence, dizziness, headache, ulcer on knee, this involves an extensive number of treatment options, and is a complaint that carries with it a high risk of complications and morbidity.  I considered the following differential and admission for this acute, potentially life threatening condition.   MDM:    Ddx of acute altered mental status or encephalopathy considered but not limited to: -Patient presents with hyperglycemia and somnolence, consider DKA versus HHS.  Began treatment with fluids.  He is not acidotic and does not have any ketonuria however his beta hydroxybutyrate is mildly elevated.  Consider early DKA versus HHS.  Will give subcu insulin. -No head trauma reported and NCAT on exam however patient plaint of tenderness and headache and somnolence that is abnormal for the patient.  Obtained CT head which showed NAICP.  No focal neurodeficits besides intermittent blurry vision which patient does not have at this time.  No ocular abnormalities noted on exam.  Consider tension headache or migraine, will give headache cocktail and reassess. -Labs demonstrate no significant electrolyte abnormalities, corrected sodium for the glucose is to within normal limits. -Reports no fevers or chills but consider infection such as UTI, viral syndrome, pneumonia.  He has no meningismus or nuchal rigidity to indicate meningitis  -Toxic ingestion such as polypharmacy.  Patient denies any drug or alcohol use -No hypercarbia noted on VBG -No acute changes to his EKG, c/o no chest pain to indicate ACS or arrhythmia     Clinical Course as of 06/13/23 2139  Wed Jun 13, 2023  1713 Glucose-Capillary(!!): 566 [HN]  1843 CBC with Differential(!) Unremarkable in the context of this patient's presentation  [HN]  1844 pH, Ven: 7.38 No acidosis or  hypercarbia [HN]  1844 Urinalysis, w/ Reflex to Culture (Infection Suspected) -Urine, Clean Catch(!) Glucosuria without UTI or hematuria [HN]  1844 Sodium(!): 130 Corrected 136-139 [HN]  1906 Beta-Hydroxybutyric Acid(!): 0.93 Mildly elevated [HN]  1948 CT Head Wo Contrast No acute intracranial abnormality. [HN]  2058 Glucose-Capillary(!): 386 Improving BG [HN]  2058 CT Head Wo Contrast No acute intracranial abnormality. [HN]  2136 Patient persistently altered even after fluids and his glucose begins to improve.  Other workup is unremarkable.Marland Kitchen Ex-girlfriend at bedside states this is very abnormal for him and he has been this way for several days. Will consult for admission. [HN]    Clinical Course User Index [HN] Loetta Rough, MD    Labs: I Ordered, and personally interpreted labs.  The pertinent results include:  those listed above  Imaging Studies ordered: I ordered imaging studies including CTH  I independently visualized and interpreted imaging. I agree with the radiologist interpretation  Additional history obtained from chart review, ex-girlfriend at bedside.   Cardiac Monitoring: The patient was maintained on a cardiac monitor.  I personally viewed and interpreted the cardiac monitored which showed an underlying rhythm of: NSR  Reevaluation: After the interventions noted above, I reevaluated the patient and found that they have :stayed the same  Social Determinants of Health: Lives independently  Disposition: Admit to medicine  Co morbidities that complicate the patient evaluation  Past Medical History:  Diagnosis Date   MI (myocardial infarction) (HCC) 10/2019   Migraine headache    Type II diabetes mellitus (HCC)      Medicines Meds ordered this encounter  Medications   lactated ringers bolus 1,000 mL   acetaminophen (TYLENOL) tablet 1,000 mg   prochlorperazine (COMPAZINE) injection 10 mg   diphenhydrAMINE (BENADRYL) injection 25 mg   insulin aspart  (novoLOG) injection 10 Units    I have reviewed the patients home medicines and have made adjustments as needed  Problem List / ED Course: Problem List Items Addressed This Visit   None Visit Diagnoses       Somnolence    -  Primary     Open knee wound, right, initial encounter         Hyperglycemia                       This note was created using dictation software, which may contain spelling or grammatical errors.    Loetta Rough, MD 06/22/23 [phone]

## 2023-06-13 NOTE — ED Triage Notes (Signed)
 Pt stated his right knees skin "peeled open" 06/06/2023. He went to the MD and was given antibiotics that made him sick. This morning his pain was 7/10 in the knee and a 10/10 in his lower back. Pt is diabetic and has a current CBG 566. Pt has been treating with insulin . CBG came down to the 300's but came back up to the 500's. Pt is nauseated and has blurry vision and pain in the back of the head at 10/10.

## 2023-06-14 DIAGNOSIS — Z720 Tobacco use: Secondary | ICD-10-CM

## 2023-06-14 DIAGNOSIS — S81001A Unspecified open wound, right knee, initial encounter: Secondary | ICD-10-CM

## 2023-06-14 DIAGNOSIS — E1165 Type 2 diabetes mellitus with hyperglycemia: Secondary | ICD-10-CM

## 2023-06-14 DIAGNOSIS — R739 Hyperglycemia, unspecified: Secondary | ICD-10-CM

## 2023-06-14 LAB — HIV ANTIBODY (ROUTINE TESTING W REFLEX): HIV Screen 4th Generation wRfx: NONREACTIVE

## 2023-06-14 LAB — MAGNESIUM: Magnesium: 1.9 mg/dL (ref 1.7–2.4)

## 2023-06-14 LAB — BASIC METABOLIC PANEL
Anion gap: 7 (ref 5–15)
BUN: 14 mg/dL (ref 6–20)
CO2: 26 mmol/L (ref 22–32)
Calcium: 8.8 mg/dL — ABNORMAL LOW (ref 8.9–10.3)
Chloride: 104 mmol/L (ref 98–111)
Creatinine, Ser: 0.86 mg/dL (ref 0.61–1.24)
GFR, Estimated: >60 mL/min (ref >60)
Glucose, Bld: 166 mg/dL — ABNORMAL HIGH (ref 70–99)
Potassium: 4 mmol/L (ref 3.5–5.1)
Sodium: 137 mmol/L (ref 135–145)

## 2023-06-14 LAB — CBC
HCT: 41.9 % (ref 39.0–52.0)
Hemoglobin: 13.3 g/dL (ref 13.0–17.0)
MCH: 25.4 pg — ABNORMAL LOW (ref 26.0–34.0)
MCHC: 31.7 g/dL (ref 30.0–36.0)
MCV: 80.1 fL (ref 80.0–100.0)
Platelets: 269 10*3/uL (ref 150–400)
RBC: 5.23 MIL/uL (ref 4.22–5.81)
RDW: 13.2 % (ref 11.5–15.5)
WBC: 5.9 10*3/uL (ref 4.0–10.5)
nRBC: 0 % (ref 0.0–0.2)

## 2023-06-14 LAB — BETA-HYDROXYBUTYRIC ACID: Beta-Hydroxybutyric Acid: 0.06 mmol/L (ref 0.05–0.27)

## 2023-06-14 LAB — TSH: TSH: 2.562 u[IU]/mL (ref 0.350–4.500)

## 2023-06-14 LAB — CBG MONITORING, ED: Glucose-Capillary: 207 mg/dL — ABNORMAL HIGH (ref 70–99)

## 2023-06-14 LAB — VITAMIN B12: Vitamin B-12: 785 pg/mL (ref 180–914)

## 2023-06-14 LAB — OSMOLALITY
Osmolality: 297 mosm/kg — ABNORMAL HIGH (ref 275–295)
Osmolality: 290 mosm/kg (ref 275–295)

## 2023-06-14 LAB — PHOSPHORUS: Phosphorus: 3.8 mg/dL (ref 2.5–4.6)

## 2023-06-14 MED ORDER — INSULIN GLARGINE 100 UNIT/ML SOLOSTAR PEN: 30.0000 [IU] | PEN_INJECTOR | Freq: Two times a day (BID) | SUBCUTANEOUS | 11 refills | Status: DC

## 2023-06-14 MED ORDER — INSULIN GLARGINE-YFGN 100 UNIT/ML ~~LOC~~ SOLN
30.0000 [IU] | Freq: Two times a day (BID) | SUBCUTANEOUS | Status: DC
  Administered 2023-06-14: 30 [IU] via SUBCUTANEOUS
  Filled 2023-06-14 (×3): qty 0.3

## 2023-06-14 MED ORDER — INSULIN ASPART 100 UNIT/ML FLEXPEN
10.0000 [IU] | PEN_INJECTOR | Freq: Three times a day (TID) | SUBCUTANEOUS | 11 refills | Status: DC
Start: 2023-06-14 — End: 2023-06-14

## 2023-06-14 MED ORDER — INSULIN ASPART 100 UNIT/ML FLEXPEN
10.0000 [IU] | PEN_INJECTOR | Freq: Three times a day (TID) | SUBCUTANEOUS | 11 refills | Status: DC
Start: 2023-06-14 — End: 2023-07-03

## 2023-06-14 MED ORDER — MECLIZINE HCL 25 MG PO TABS: 25.0000 mg | ORAL_TABLET | Freq: Three times a day (TID) | ORAL | 0 refills | Status: DC | PRN

## 2023-06-14 NOTE — ED Notes (Signed)
Patient appears to be resting at this time, respirations even and unlabored.

## 2023-06-14 NOTE — Discharge Instructions (Signed)
just need to keep wound clean, not removal of scab until completely healed and can use dressing (dry gauze) to prevent clothes friction.

## 2023-06-14 NOTE — H&P (Addendum)
History and Physical    Joshua Escobar:[id number] DOB: 1971/05/07 DOA: 06/13/2023  PCP: Oneal Grout, FNP   Patient coming from: Home   Chief Complaint:  Chief Complaint  Patient presents with   Knee Pain   Skin Ulcer    HPI:  Joshua Escobar is a 53 y.o. male with hx of hypertension, diabetes, hyperlipidemia, former smoker, who presented due to knee pain and change in mental status.  Reports that about 1 week ago developed an ulceration over his right knee.  Was seen outpatient and completed 5-day course of Bactrim 1/10-15.  With treatment appears that his knee has been improving.  However had both knee pain and lower back pain today.  Also noted by his ex-girlfriend to have somnolence and not seeming like himself.  Otherwise recently having difficult to control blood sugar at home, reports this is only been since the past week.  Does have polyuria polydipsia.  Reports that he was living in [place] until September and therefore his medication fill history is not accurate.  However I do not see any fills of insulin and many years, and no fills in September.  Despite this reports that he has been taking insulin as prescribed, reports taking long-acting insulin 25 units twice a day and short acting 15 units 3 times a day with meals.   Review of Systems:  ROS complete and negative except as marked above   No Known Allergies  Prior to Admission medications   Medication Sig Start Date End Date Taking? Authorizing Provider  meclizine (ANTIVERT) 25 MG tablet Take 1 tablet (25 mg total) by mouth 3 (three) times daily as needed for dizziness. 05/16/23  Yes Henderly, Britni A, PA-C  sulfamethoxazole-trimethoprim (BACTRIM DS) 800-160 MG tablet Take 1 tablet by mouth 2 (two) times daily. 06/08/23  Yes Donita Brooks, MD    Past Medical History:  Diagnosis Date   MI (myocardial infarction) (HCC) 10/2019   Migraine headache    Type II diabetes mellitus (HCC)     Past Surgical  History:  Procedure Laterality Date   BIOPSY  11/16/2020   Procedure: BIOPSY;  Surgeon: Dolores Frame, MD;  [facility];  Service: Gastroenterology;;   COLONOSCOPY WITH PROPOFOL N/A 09/11/2020   Procedure: COLONOSCOPY WITH PROPOFOL;  Surgeon: Dolores Frame, MD;  Location: [facility];  Service: Gastroenterology;  Laterality: N/A;   ESOPHAGOGASTRODUODENOSCOPY (EGD) WITH PROPOFOL N/A 11/16/2020   Procedure: ESOPHAGOGASTRODUODENOSCOPY (EGD) WITH PROPOFOL;  Surgeon: Dolores Frame, MD;  Location: [facility];  Service: Gastroenterology;  Laterality: N/A;  12:10   LEFT HEART CATH AND CORONARY ANGIOGRAPHY N/A 11/27/2016   Procedure: Left Heart Cath and Coronary Angiography;  Surgeon: Runell Gess, MD;  Location: [facility] INVASIVE CV LAB;  Service: Cardiovascular;  Laterality: N/A;     reports that he has quit smoking. His smoking use included cigarettes. He has a 5 pack-year smoking history. He has never used smokeless tobacco. He reports current alcohol use of about 1.0 standard drink of alcohol per week. He reports that he does not use drugs.  Family History  Problem Relation Age of Onset   Diabetes Mellitus II Sister    Diabetes Sister    Diabetes Mellitus II Brother    Diabetes Brother    Diabetes Mellitus II Mother    Diabetes Mother    Diabetes Mellitus II Father    Diabetes Father    Heart disease Father      Physical  Exam: Vitals:   06/14/23 0400 06/14/23 0430 06/14/23 0530 06/14/23 0610  BP: 99/82 104/75 104/77 118/86  Pulse: 75 79 85 80  Resp: 11 17 19 11   Temp:    97.9 F (36.6 C)  TempSrc:    Oral  SpO2: 98% 99% 95% 98%  Weight:      Height:        Gen: Somnolent but awakens to voice then remains awake, alert, NAD   CV: Regular, normal S1, S2, no murmurs  Resp: Normal WOB, CTAB  Abd: Flat, normoactive, nontender MSK: Symmetric, no edema  Skin: over the right knee there is a superficial erosion which is dry,  fibrinous/white base, no drainage, no surrounding skin changes or induration.  No underlying effusion of the right knee.  There are also numerous what appear to be excoriated round scars over the lower legs Neuro: Somnolent but awakens to voice then remains awake, alert and interactive.  CN II through XII intact, motor is 5 out of 5 and symmetric, sensation is intact and equal to fine touch Psych: euthymic, appropriate    Data review:   Labs reviewed, notable for:   Blood glucose 492, VBG 7.38/46, bicarb 24, no anion gap, beta hydroxybutyrate 0.93.  UA no ketones Recent A1c greater than 14% on 1/10  Micro:  Results for orders placed or performed during the hospital encounter of 09/09/20  Resp Panel by RT-PCR (Flu A&B, Covid) Nasopharyngeal Swab     Status: None   Collection Time: 09/09/20  7:34 PM   Specimen: Nasopharyngeal Swab; Nasopharyngeal(NP) swabs in vial transport medium  Result Value Ref Range Status   SARS Coronavirus 2 by RT PCR NEGATIVE NEGATIVE Final    Comment: (NOTE) SARS-CoV-2 target nucleic acids are NOT DETECTED.  The SARS-CoV-2 RNA is generally detectable in upper respiratory specimens during the acute phase of infection. The lowest concentration of SARS-CoV-2 viral copies this assay can detect is 138 copies/mL. A negative result does not preclude SARS-Cov-2 infection and should not be used as the sole basis for treatment or other patient management decisions. A negative result may occur with  improper specimen collection/handling, submission of specimen other than nasopharyngeal swab, presence of viral mutation(s) within the areas targeted by this assay, and inadequate number of viral copies(<138 copies/mL). A negative result must be combined with clinical observations, patient history, and epidemiological information. The expected result is Negative.  Fact Sheet for Patients:  BloggerCourse.com  Fact Sheet for Healthcare Providers:   SeriousBroker.it  This test is no t yet approved or cleared by the [place] FDA and  has been authorized for detection and/or diagnosis of SARS-CoV-2 by FDA under an Emergency Use Authorization (EUA). This EUA will remain  in effect (meaning this test can be used) for the duration of the COVID-19 declaration under Section 564(b)(1) of the Act, 21 U.S.C.section 360bbb-3(b)(1), unless the authorization is terminated  or revoked sooner.       Influenza A by PCR NEGATIVE NEGATIVE Final   Influenza B by PCR NEGATIVE NEGATIVE Final    Comment: (NOTE) The Xpert Xpress SARS-CoV-2/FLU/RSV plus assay is intended as an aid in the diagnosis of influenza from Nasopharyngeal swab specimens and should not be used as a sole basis for treatment. Nasal washings and aspirates are unacceptable for Xpert Xpress SARS-CoV-2/FLU/RSV testing.  Fact Sheet for Patients: BloggerCourse.com  Fact Sheet for Healthcare Providers: SeriousBroker.it  This test is not yet approved or cleared by the [place] and has been authorized for  detection and/or diagnosis of SARS-CoV-2 by FDA under an Emergency Use Authorization (EUA). This EUA will remain in effect (meaning this test can be used) for the duration of the COVID-19 declaration under Section 564(b)(1) of the Act, 21 U.S.C. section 360bbb-3(b)(1), unless the authorization is terminated or revoked.  Performed at [facility], [address], [place], [place] [address]   MRSA PCR Screening     Status: Abnormal   Collection Time: 09/09/20 11:05 PM   Specimen: Nasal Mucosa; Nasopharyngeal  Result Value Ref Range Status   MRSA by PCR POSITIVE (A) NEGATIVE Final    Comment:        The GeneXpert MRSA Assay (FDA approved for NASAL specimens only), is one component of a comprehensive MRSA colonization surveillance program. It is not intended to diagnose MRSA infection nor  to guide or monitor treatment for MRSA infections. RESULT CALLED TO, READ BACK BY AND VERIFIED WITH: EMILY W,RN@[id number]  09/10/20 [facility] Performed at [facility], [address] [place], [place] [address]     Imaging reviewed:  CT Head Wo Contrast Result Date: 06/13/2023 CLINICAL DATA:  Headache, increasing frequency or severity. Pt c/o posterior headache EXAM: CT HEAD WITHOUT CONTRAST TECHNIQUE: Contiguous axial images were obtained from the base of the skull through the vertex without intravenous contrast. RADIATION DOSE REDUCTION: This exam was performed according to the departmental dose-optimization program which includes automated exposure control, adjustment of the mA and/or kV according to patient size and/or use of iterative reconstruction technique. COMPARISON:  Ct 05/16/23 FINDINGS: Brain: No evidence of large-territorial acute infarction. No parenchymal hemorrhage. No mass lesion. No extra-axial collection. No mass effect or midline shift. No hydrocephalus. Basilar cisterns are patent. Vascular: No hyperdense vessel. Skull: No acute fracture or focal lesion. Sinuses/Orbits: Paranasal sinuses and mastoid air cells are clear. The orbits are unremarkable. Other: None. IMPRESSION: No acute intracranial abnormality. Electronically Signed   By: Tish Frederickson M.D.   On: 06/13/2023 19:15    EKG:  Personally reviewed sinus rhythm, ST changes V1-V3 not meeting criteria for ST elevation MI, likely repolarization changes, J-point notching  ED Course:  Treated with aspart 10 units, Tylenol, 1 L LR, Benadryl and Compazine.    Assessment/Plan:  53 y.o. male with hx hypertension, diabetes, hyperlipidemia, former smoker, who presented due to knee pain and change in mental status. Found to have severe range hyperglycemia with possible developing or very early DKA / HHS.   Severe hypoglycemia, possible developing or very early DKA/HHS Uncontrolled diabetes type 2 Somnolence, likely secondary to  above Not meeting criteria for either DKA/HHS however on initial evaluation is borderline with blood glucose 492, VBG 7.38/46, bicarb 24, no anion gap, beta hydroxybutyrate 0.93.  Serum osm pending. UA no ketones. Recent A1c greater than 14% on 1/10.  Home insulin regimen reported basal 25 units twice daily and short acting 15 units 3 times daily with meals.  Reports adherence with insulin however remain concerned that he is not taking his prescribed. -Status post aspart 10 units in the ED with improving glycemic control. -Give equivalent home dosing Semglee 25 units twice daily with first dose now -Status post 1 L IV fluid in the ED, given additional 1 L then continue oral hydration -For mealtime slight reduction to 10 units scheduled 3 times daily with meals, add SSI for moderate -For additional evaluation of encephalopathy check TSH, B12  Right knee cellulitis, treated Has completed outpatient treatment with Bactrim from 1/10 to 1/15.  On evaluation does not appear to have  active cellulitis. -Hold off on additional antibiotics for now. -Symptomatic management Tylenol as needed for pain.  Chronic medical problems: History of hypertension: Not on antihypertensives Hyperlipidemia: Not on cholesterol-lowering medication   Body mass index is 22.96 kg/m.    DVT prophylaxis:  Lovenox Code Status:  Full Code Diet:  Diet Orders (From admission, onward)     Start     Ordered   06/13/23 2310  Diet NPO time specified Except for: Other (See Comments)  Diet effective now       Comments: OK for water (noncaloric liquids)  Question:  Except for  Answer:  Other (See Comments)   06/13/23 2314           Family Communication:  No   Consults:  None   Admission status:   Observation, Telemetry bed  Severity of Illness: The appropriate patient status for this patient is OBSERVATION. Observation status is judged to be reasonable and necessary in order to provide the required intensity of service  to ensure the patient's safety. The patient's presenting symptoms, physical exam findings, and initial radiographic and laboratory data in the context of their medical condition is felt to place them at decreased risk for further clinical deterioration. Furthermore, it is anticipated that the patient will be medically stable for discharge from the hospital within 2 midnights of admission.    Dolly Rias, MD [facility]  How to contact the [facility] Attending or Consulting provider 7A - 7P or covering provider during after hours 7P -7A, for this patient.  Check the care team in [facility] and look for a) attending/consulting TR[facility] provider listed and b) the [facility] team listed Log into [email or web address] and use [place]'s universal password to access. If you do not have the password, please contact the hospital operator. Locate the [facility] provider you are looking for under [facility] and page to a number that you can be directly reached. If you still have difficulty reaching the provider, please page the [facility] (Director on Call) for the Hospitalists listed on amion for assistance.  06/14/2023, 6:27 AM

## 2023-06-14 NOTE — ED Notes (Signed)
Messaged provider about patient's diet order at this time.

## 2023-06-14 NOTE — ED Notes (Signed)
Messaged MD Gwenlyn Perking about wound care instructions for d/c.

## 2023-06-14 NOTE — Progress Notes (Signed)
Transition of Care Department [facility]) has reviewed patient and no other TOC needs have been identified at this time. We will continue to monitor patient advancement through interdisciplinary progression rounds. If new patient transition needs arise, please place a TOC consult.   06/14/23 1052  TOC Brief Assessment  Insurance and Status Lapsed (Insurance will start next month.)  Patient has primary care physician Yes  Home environment has been reviewed Lives with brother.  Prior level of function: Independent.  Prior/Current Home Services No current home services  Social Drivers of Health Review SDOH reviewed no interventions necessary  Readmission risk has been reviewed Yes  Transition of care needs no transition of care needs at this time

## 2023-06-14 NOTE — Discharge Summary (Signed)
Physician Discharge Summary   Patient: Joshua Escobar MRN: [id number] DOB: [birth year]  Admit date:     06/13/2023  Discharge date: 06/14/23  Discharge Physician: Vassie Loll   PCP: Oneal Grout, FNP   Recommendations at discharge:  Repeat basic metabolic panel to follow electrolytes and renal function Close monitoring of patient's CBGs with further adjustment to hypoglycemia regimen as needed. Reassess patient's right knee for complete resolution of recent wound with superimposed cellulitis.  Discharge Diagnoses: Principal Problem:   Encephalopathy Active Problems:   Tobacco abuse   Hyperglycemia   Open knee wound, right, initial encounter  Brief Hospital admission course: As per H&P written by Dr. Lazarus Salines on 06/13/2023 Joshua Escobar is a 53 y.o. male with hx of hypertension, diabetes, hyperlipidemia, former smoker, who presented due to knee pain and change in mental status.  Reports that about 1 week ago developed an ulceration over his right knee.  Was seen outpatient and completed 5-day course of Bactrim 1/10-15.  With treatment appears that his knee has been improving.  However had both knee pain and lower back pain today.  Also noted by his ex-girlfriend to have somnolence and not seeming like himself.  Otherwise recently having difficult to control blood sugar at home, reports this is only been since the past week.  Does have polyuria polydipsia.  Reports that he was living in [place] until September and therefore his medication fill history is not accurate.  However I do not see any fills of insulin and many years, and no fills in September.  Despite this reports that he has been taking insulin as prescribed, reports taking long-acting insulin 25 units twice a day and short acting 15 units 3 times a day with meals.     Assessment and Plan: 1-severe hyperglycemia with early development of hyperosmolar state. -Patient with uncontrolled type 2 diabetes (chronic insulin  therapy); A1c over 14. -Presenting with concern for metabolic encephalopathy -After fluid resuscitation and insulin therapy patient's glucoses stabilized and mentation back to his baseline -CT scan of the head demonstrated not acute intracranial abnormality. -Further workup as part of encephalopathy also checked To be within normal limits. -Patient advised to maintain adequate hydration to take insulin as prescribed and to closely follow with his PCP as an outpatient.  2-right knee cellulitis/wound -Patient has completed treatment with antibiotics -At presentation there is no swelling, active drainage, cellulitic changes or concern for active infection -Patient advised to continue local care and to follow-up with PCP.  3-history of hypertension -Blood pressure/vital signs are stable and currently within normal limit -Heart healthy diet discussed with patient -No need for antihypertensive agents at the moment.  4-hyperlipidemia -Heart healthy diet discussed with patient -Will recommend checking lipid panel and LFTs and determine the need for statin therapy.  5-positional vertigo -Continue as needed use of meclizine.  Consultants: None Procedures performed: Severe below for x-ray report. Disposition: Home Diet recommendation: Heart healthy modified carbohydrate diet.  DISCHARGE MEDICATION: Allergies as of 06/14/2023   No Known Allergies      Medication List     STOP taking these medications    sulfamethoxazole-trimethoprim 800-160 MG tablet Commonly known as: BACTRIM DS       TAKE these medications    insulin aspart 100 UNIT/ML FlexPen Commonly known as: NOVOLOG Inject 10 Units into the skin 3 (three) times daily with meals.   insulin glargine 100 UNIT/ML Solostar Pen Commonly known as: LANTUS Inject 30 Units into the skin 2 (two) times daily.  meclizine 25 MG tablet Commonly known as: ANTIVERT Take 1 tablet (25 mg total) by mouth 3 (three) times daily as  needed for dizziness.        Follow-up Information     Oneal Grout, FNP. Schedule an appointment as soon as possible for a visit in 1 week(s).   Specialty: Family Medicine Why: contact office for appointment details. Contact information: [address] [place] [address] [phone]                Discharge Exam: Filed Weights   06/13/23 1657  Weight: 68.5 kg   General exam: Alert, awake, oriented x 3 Respiratory system: Clear to auscultation. Respiratory effort normal. Cardiovascular system:RRR. No murmurs, rubs, gallops. Gastrointestinal system: Abdomen is nondistended, soft and nontender. No organomegaly or masses felt. Normal bowel sounds heard. Central nervous system: Alert and oriented. No focal neurological deficits. Extremities: No cyanosis, clubbing or edema; right knee with positive recent wound with intact scab and not active drainage.  There is no erythematous changes or swelling in surrounding wound borders. Skin: No petechiae. Psychiatry: Judgement and insight appear normal. Mood & affect appropriate.    Condition at discharge: Stable and improved.  The results of significant diagnostics from this hospitalization (including imaging, microbiology, ancillary and laboratory) are listed below for reference.   Imaging Studies: CT Head Wo Contrast Result Date: 06/13/2023 CLINICAL DATA:  Headache, increasing frequency or severity. Pt c/o posterior headache EXAM: CT HEAD WITHOUT CONTRAST TECHNIQUE: Contiguous axial images were obtained from the base of the skull through the vertex without intravenous contrast. RADIATION DOSE REDUCTION: This exam was performed according to the departmental dose-optimization program which includes automated exposure control, adjustment of the mA and/or kV according to patient size and/or use of iterative reconstruction technique. COMPARISON:  Ct 05/16/23 FINDINGS: Brain: No evidence of large-territorial acute infarction. No  parenchymal hemorrhage. No mass lesion. No extra-axial collection. No mass effect or midline shift. No hydrocephalus. Basilar cisterns are patent. Vascular: No hyperdense vessel. Skull: No acute fracture or focal lesion. Sinuses/Orbits: Paranasal sinuses and mastoid air cells are clear. The orbits are unremarkable. Other: None. IMPRESSION: No acute intracranial abnormality. Electronically Signed   By: Tish Frederickson M.D.   On: 06/13/2023 19:15   CT Head Wo Contrast Result Date: 05/16/2023 CLINICAL DATA:  Headache, dizziness EXAM: CT HEAD WITHOUT CONTRAST TECHNIQUE: Contiguous axial images were obtained from the base of the skull through the vertex without intravenous contrast. RADIATION DOSE REDUCTION: This exam was performed according to the departmental dose-optimization program which includes automated exposure control, adjustment of the mA and/or kV according to patient size and/or use of iterative reconstruction technique. COMPARISON:  11/14/2020 FINDINGS: Brain: No evidence of acute infarction, hemorrhage, mass, mass effect, or midline shift. No hydrocephalus or extra-axial fluid collection. Vascular: No hyperdense vessel. Skull: Negative for fracture or focal lesion. Sinuses/Orbits: No acute finding. Other: The mastoid air cells are well aerated. IMPRESSION: No acute intracranial process. Electronically Signed   By: Wiliam Ke M.D.   On: 05/16/2023 18:14    Microbiology: Results for orders placed or performed during the hospital encounter of 09/09/20  Resp Panel by RT-PCR (Flu A&B, Covid) Nasopharyngeal Swab     Status: None   Collection Time: 09/09/20  7:34 PM   Specimen: Nasopharyngeal Swab; Nasopharyngeal(NP) swabs in vial transport medium  Result Value Ref Range Status   SARS Coronavirus 2 by RT PCR NEGATIVE NEGATIVE Final    Comment: (NOTE) SARS-CoV-2 target nucleic acids are NOT DETECTED.  The SARS-CoV-2 RNA is generally detectable in upper respiratory specimens during the acute  phase of infection. The lowest concentration of SARS-CoV-2 viral copies this assay can detect is 138 copies/mL. A negative result does not preclude SARS-Cov-2 infection and should not be used as the sole basis for treatment or other patient management decisions. A negative result may occur with  improper specimen collection/handling, submission of specimen other than nasopharyngeal swab, presence of viral mutation(s) within the areas targeted by this assay, and inadequate number of viral copies(<138 copies/mL). A negative result must be combined with clinical observations, patient history, and epidemiological information. The expected result is Negative.  Fact Sheet for Patients:  BloggerCourse.com  Fact Sheet for Healthcare Providers:  SeriousBroker.it  This test is no t yet approved or cleared by the [place] FDA and  has been authorized for detection and/or diagnosis of SARS-CoV-2 by FDA under an Emergency Use Authorization (EUA). This EUA will remain  in effect (meaning this test can be used) for the duration of the COVID-19 declaration under Section 564(b)(1) of the Act, 21 U.S.C.section 360bbb-3(b)(1), unless the authorization is terminated  or revoked sooner.       Influenza A by PCR NEGATIVE NEGATIVE Final   Influenza B by PCR NEGATIVE NEGATIVE Final    Comment: (NOTE) The Xpert Xpress SARS-CoV-2/FLU/RSV plus assay is intended as an aid in the diagnosis of influenza from Nasopharyngeal swab specimens and should not be used as a sole basis for treatment. Nasal washings and aspirates are unacceptable for Xpert Xpress SARS-CoV-2/FLU/RSV testing.  Fact Sheet for Patients: BloggerCourse.com  Fact Sheet for Healthcare Providers: SeriousBroker.it  This test is not yet approved or cleared by the [place] FDA and has been authorized for detection and/or diagnosis of  SARS-CoV-2 by FDA under an Emergency Use Authorization (EUA). This EUA will remain in effect (meaning this test can be used) for the duration of the COVID-19 declaration under Section 564(b)(1) of the Act, 21 U.S.C. section 360bbb-3(b)(1), unless the authorization is terminated or revoked.  Performed at [facility], [address] [place], [place] [address]   MRSA PCR Screening     Status: Abnormal   Collection Time: 09/09/20 11:05 PM   Specimen: Nasal Mucosa; Nasopharyngeal  Result Value Ref Range Status   MRSA by PCR POSITIVE (A) NEGATIVE Final    Comment:        The GeneXpert MRSA Assay (FDA approved for NASAL specimens only), is one component of a comprehensive MRSA colonization surveillance program. It is not intended to diagnose MRSA infection nor to guide or monitor treatment for MRSA infections. RESULT CALLED TO, READ BACK BY AND VERIFIED WITH: EMILY [email or web address]  09/10/20 MKELLY Performed at [facility], [address] [place], [place] [address]     Labs: CBC: Recent Labs  Lab 06/08/23 0926 06/13/23 1733 06/14/23 0336  WBC 7.5 7.6 5.9  NEUTROABS 4,028 4.9  --   HGB 14.9 15.5 13.3  HCT 50.0 48.1 41.9  MCV 83.5 79.5* 80.1  PLT 246 267 269   Basic Metabolic Panel: Recent Labs  Lab 06/08/23 0926 06/13/23 1733 06/14/23 0336  NA 139 130* 137  K 4.9 5.2* 4.0  CL 100 97* 104  CO2 28 24 26   GLUCOSE 521* 492* 166*  BUN 13 19 14   CREATININE 1.16 1.01 0.86  CALCIUM 9.8 9.2 8.8*  MG  --   --  1.9  PHOS  --   --  3.8   Liver Function Tests: Recent Labs  Lab  06/13/23 1733  AST 19  ALT 16  ALKPHOS 100  BILITOT 0.8  PROT 7.8  ALBUMIN 3.9   CBG: Recent Labs  Lab 06/13/23 1649 06/13/23 2042 06/13/23 2355 06/14/23 0831  GLUCAP 566* 386* 241* 207*    Discharge time spent: greater than 30 minutes.  Signed: Vassie Loll, MD [facility] 06/14/2023

## 2023-06-15 ENCOUNTER — Ambulatory Visit (INDEPENDENT_AMBULATORY_CARE_PROVIDER_SITE_OTHER): Payer: Self-pay | Admitting: Family Medicine

## 2023-06-15 ENCOUNTER — Encounter: Payer: Self-pay | Admitting: Family Medicine

## 2023-06-15 VITALS — BP 126/82 | HR 89 | Temp 98.2°F | Ht 68.0 in | Wt 157.8 lb

## 2023-06-15 DIAGNOSIS — Z794 Long term (current) use of insulin: Secondary | ICD-10-CM

## 2023-06-15 DIAGNOSIS — L03115 Cellulitis of right lower limb: Secondary | ICD-10-CM

## 2023-06-15 DIAGNOSIS — E1165 Type 2 diabetes mellitus with hyperglycemia: Secondary | ICD-10-CM

## 2023-06-15 NOTE — Progress Notes (Signed)
Subjective:    Patient ID: Joshua Escobar, male    DOB: [birth year], 53 y.o.   MRN: [id number]  HPI   Joshua Loll, MD  Physician Internal Medicine   Discharge Summary    Signed   Date of Service: 06/14/2023 10:37 AM   Signed     Expand All Collapse All      Physician Discharge Summary    Patient: Joshua Escobar MRN: [id number] DOB: April 18, 1971  Admit date:     06/13/2023  Discharge date: 06/14/23  Discharge Physician: Joshua Escobar    PCP: Oneal Grout, FNP    Recommendations at discharge:  Repeat basic metabolic panel to follow electrolytes and renal function Close monitoring of patient's CBGs with further adjustment to hypoglycemia regimen as needed. Reassess patient's right knee for complete resolution of recent wound with superimposed cellulitis.   Discharge Diagnoses: Principal Problem:   Encephalopathy Active Problems:   Tobacco abuse   Hyperglycemia   Open knee wound, right, initial encounter   Brief Hospital admission course: As per H&P written by Dr. Lazarus Salines on 06/13/2023 [name] is a 53 y.o. male with hx of hypertension, diabetes, hyperlipidemia, former smoker, who presented due to knee pain and change in mental status.  Reports that about 1 week ago developed an ulceration over his right knee.  Was seen outpatient and completed 5-day course of Bactrim 1/10-15.  With treatment appears that his knee has been improving.  However had both knee pain and lower back pain today.  Also noted by his ex-girlfriend to have somnolence and not seeming like himself.  Otherwise recently having difficult to control blood sugar at home, reports this is only been since the past week.  Does have polyuria polydipsia.  Reports that he was living in [place] until September and therefore his medication fill history is not accurate.  However I do not see any fills of insulin and many years, and no fills in September.  Despite this reports that he has been taking insulin as  prescribed, reports taking long-acting insulin 25 units twice a day and short acting 15 units 3 times a day with meals.     Assessment and Plan: 1-severe hyperglycemia with early development of hyperosmolar state. -Patient with uncontrolled type 2 diabetes (chronic insulin therapy); A1c over 14. -Presenting with concern for metabolic encephalopathy -After fluid resuscitation and insulin therapy patient's glucoses stabilized and mentation back to his baseline -CT scan of the head demonstrated not acute intracranial abnormality. -Further workup as part of encephalopathy also checked To be within normal limits. -Patient advised to maintain adequate hydration to take insulin as prescribed and to closely follow with his PCP as an outpatient.   2-right knee cellulitis/wound -Patient has completed treatment with antibiotics -At presentation there is no swelling, active drainage, cellulitic changes or concern for active infection -Patient advised to continue local care and to follow-up with PCP.   3-history of hypertension -Blood pressure/vital signs are stable and currently within normal limit -Heart healthy diet discussed with patient -No need for antihypertensive agents at the moment.   4-hyperlipidemia -Heart healthy diet discussed with patient -Will recommend checking lipid panel and LFTs and determine the need for statin therapy.   5-positional vertigo -Continue as needed use of meclizine.   Consultants: None Procedures performed: Severe below for x-ray report. Disposition: Home Diet recommendation: Heart healthy modified carbohydrate diet.   DISCHARGE MEDICATION: Allergies as of 06/14/2023   No Known Allergies  Medication List       STOP taking these medications            Patient is here today for follow-up.  He is adamant that has been taking 25 units of Lantus twice daily and 15 units of NovoLog 3 times a day with meals.  He states that he occasionally forgets  to take his 15 units with supper or when he is at work.  However his hemoglobin A1c was greater than 14 suggesting that he has not been taking his insulin as consistently as reported.  He was recently discharged from the hospital and instructed to take Lantus 30 units twice daily along with 15 units of NovoLog 3 times a day with meals.  The erythema around his right knee Has improved.  The warmth has improved.  He has completed all of his antibiotics.  There is still a wound 5 cm x 4 cm with the underlying subcutaneous fat exposed.   Past Medical History:  Diagnosis Date   MI (myocardial infarction) (HCC) 10/2019   Migraine headache    Type II diabetes mellitus (HCC)    Past Surgical History:  Procedure Laterality Date   BIOPSY  11/16/2020   Procedure: BIOPSY;  Surgeon: Dolores Frame, MD;  [facility];  Service: Gastroenterology;;   COLONOSCOPY WITH PROPOFOL N/A 09/11/2020   Procedure: COLONOSCOPY WITH PROPOFOL;  Surgeon: Dolores Frame, MD;  Location: [facility];  Service: Gastroenterology;  Laterality: N/A;   ESOPHAGOGASTRODUODENOSCOPY (EGD) WITH PROPOFOL N/A 11/16/2020   Procedure: ESOPHAGOGASTRODUODENOSCOPY (EGD) WITH PROPOFOL;  Surgeon: Dolores Frame, MD;  Location: [facility];  Service: Gastroenterology;  Laterality: N/A;  12:10   LEFT HEART CATH AND CORONARY ANGIOGRAPHY N/A 11/27/2016   Procedure: Left Heart Cath and Coronary Angiography;  Surgeon: Runell Gess, MD;  Location: [facility];  Service: Cardiovascular;  Laterality: N/A;   Current Outpatient Medications on File Prior to Visit  Medication Sig Dispense Refill   insulin aspart (NOVOLOG) 100 UNIT/ML FlexPen Inject 10 Units into the skin 3 (three) times daily with meals. 15 mL 11   insulin glargine (LANTUS) 100 UNIT/ML Solostar Pen Inject 30 Units into the skin 2 (two) times daily. 15 mL 11   meclizine (ANTIVERT) 25 MG tablet Take 1 tablet (25 mg total) by mouth 3 (three)  times daily as needed for dizziness. 30 tablet 0   No current facility-administered medications on file prior to visit.   No Known Allergies Social History   Socioeconomic History   Marital status: Single    Spouse name: Not on file   Number of children: 2   Years of education: 12   Highest education level: GED or equivalent  Occupational History   Occupation: Naval architect   Occupation: BUILF ATM MACHINES    Employer: [employer]  Tobacco Use   Smoking status: Former    Current packs/day: 0.25    Average packs/day: 0.3 packs/day for 20.0 years (5.0 ttl pk-yrs)    Types: Cigarettes   Smokeless tobacco: Never  Vaping Use   Vaping status: Former   Substances: Nicotine, Flavoring  Substance and Sexual Activity   Alcohol use: Yes    Alcohol/week: 1.0 standard drink of alcohol    Types: 1 Cans of beer per week    Comment: weekends only   Drug use: No   Sexual activity: Not Currently  Other Topics Concern   Not on file  Social History Narrative   Fun/Hobby:  Basketball, fish, bowling.    Social Drivers of Corporate investment banker Strain: Low Risk  (06/08/2023)   Overall Financial Resource Strain (CARDIA)    Difficulty of Paying Living Expenses: Not hard at all  Food Insecurity: Food Insecurity Present (06/08/2023)   Hunger Vital Sign    Worried About Running Out of Food in the Last Year: Sometimes true    Ran Out of Food in the Last Year: Sometimes true  Transportation Needs: No Transportation Needs (06/08/2023)   PRAPARE - Administrator, Civil Service (Medical): No    Lack of Transportation (Non-Medical): No  Physical Activity: Sufficiently Active (06/08/2023)   Exercise Vital Sign    Days of Exercise per Week: 7 days    Minutes of Exercise per Session: 60 min  Stress: No Stress Concern Present (06/08/2023)   [employer] of Occupational Health - Occupational Stress Questionnaire    Feeling of Stress : Not at all  Social Connections: Socially  Isolated (06/08/2023)   Social Connection and Isolation Panel [NHANES]    Frequency of Communication with Friends and Family: More than three times a week    Frequency of Social Gatherings with Friends and Family: More than three times a week    Attends Religious Services: Never    Database administrator or Organizations: No    Attends Engineer, structural: Not on file    Marital Status: Divorced  Intimate Partner Violence: Not At Risk (01/24/2023)   Received from [employer], Afraid, Rape, and Kick questionnaire    Fear of Current or Ex-Partner: No    Emotionally Abused: No    Physically Abused: No    Sexually Abused: No        Review of Systems     Objective:   Physical Exam Vitals reviewed.  Constitutional:      Appearance: Normal appearance. He is normal weight.  Cardiovascular:     Rate and Rhythm: Normal rate and regular rhythm.     Heart sounds: Normal heart sounds.  Pulmonary:     Effort: Pulmonary effort is normal.     Breath sounds: Normal breath sounds.  Musculoskeletal:       Legs:  Skin:    Findings: Lesion present.  Neurological:     Mental Status: He is alert.           Assessment & Plan:  Cellulitis of right lower extremity  Type 2 diabetes mellitus with hyperglycemia, with long-term current use of insulin (HCC) Cellulitis has resolved.  However explained to the patient the wound is not going to heal on his knees if his sugars are out of control.  I recommended that he cover the wound with Polysporin and a bandage on a daily basis until healed.  Meanwhile, I gave the patient 45 days worth of freestyle libre 3 continuous blood glucose monitoring equipment that he can monitor his sugars.  I encouraged him to take Lantus 30 units twice daily and NovoLog 15 units 3 times a day with meals.  I want to see him back in 1 week with the sugar values recorded so that we can titrate his insulin further.  Patient states that his blood  sugar this morning was 200 however he has only been on this new regimen of insulin for 24 hours.

## 2023-06-21 ENCOUNTER — Encounter (HOSPITAL_COMMUNITY): Payer: Self-pay

## 2023-06-21 ENCOUNTER — Emergency Department (HOSPITAL_COMMUNITY): Payer: Self-pay

## 2023-06-21 ENCOUNTER — Emergency Department (HOSPITAL_COMMUNITY)
Admission: EM | Admit: 2023-06-21 | Discharge: 2023-06-21 | Disposition: A | Discharge status: Home / Self Care | Payer: Self-pay | Attending: Emergency Medicine | Admitting: Emergency Medicine

## 2023-06-21 ENCOUNTER — Other Ambulatory Visit: Payer: Self-pay

## 2023-06-21 DIAGNOSIS — Z87891 Personal history of nicotine dependence: Secondary | ICD-10-CM | POA: Insufficient documentation

## 2023-06-21 DIAGNOSIS — E119 Type 2 diabetes mellitus without complications: Secondary | ICD-10-CM | POA: Insufficient documentation

## 2023-06-21 DIAGNOSIS — J101 Influenza due to other identified influenza virus with other respiratory manifestations: Secondary | ICD-10-CM | POA: Insufficient documentation

## 2023-06-21 DIAGNOSIS — Z794 Long term (current) use of insulin: Secondary | ICD-10-CM | POA: Insufficient documentation

## 2023-06-21 DIAGNOSIS — Z20822 Contact with and (suspected) exposure to covid-19: Secondary | ICD-10-CM | POA: Insufficient documentation

## 2023-06-21 DIAGNOSIS — J111 Influenza due to unidentified influenza virus with other respiratory manifestations: Secondary | ICD-10-CM

## 2023-06-21 DIAGNOSIS — I10 Essential (primary) hypertension: Secondary | ICD-10-CM | POA: Insufficient documentation

## 2023-06-21 DIAGNOSIS — R079 Chest pain, unspecified: Secondary | ICD-10-CM

## 2023-06-21 LAB — CBC
HCT: 36.3 % — ABNORMAL LOW (ref 39.0–52.0)
Hemoglobin: 11.6 g/dL — ABNORMAL LOW (ref 13.0–17.0)
MCH: 25.6 pg — ABNORMAL LOW (ref 26.0–34.0)
MCHC: 32 g/dL (ref 30.0–36.0)
MCV: 80.1 fL (ref 80.0–100.0)
Platelets: 321 10*3/uL (ref 150–400)
RBC: 4.53 MIL/uL (ref 4.22–5.81)
RDW: 13.8 % (ref 11.5–15.5)
WBC: 5.8 10*3/uL (ref 4.0–10.5)
nRBC: 0 % (ref 0.0–0.2)

## 2023-06-21 LAB — BASIC METABOLIC PANEL
Anion gap: 8 (ref 5–15)
BUN: 10 mg/dL (ref 6–20)
CO2: 24 mmol/L (ref 22–32)
Calcium: 8.5 mg/dL — ABNORMAL LOW (ref 8.9–10.3)
Chloride: 104 mmol/L (ref 98–111)
Creatinine, Ser: 0.82 mg/dL (ref 0.61–1.24)
GFR, Estimated: >60 mL/min (ref >60)
Glucose, Bld: 171 mg/dL — ABNORMAL HIGH (ref 70–99)
Potassium: 3.8 mmol/L (ref 3.5–5.1)
Sodium: 136 mmol/L (ref 135–145)

## 2023-06-21 LAB — RESP PANEL BY RT-PCR (RSV, FLU A&B, COVID)  RVPGX2
Influenza A by PCR: POSITIVE — AB
Influenza B by PCR: NEGATIVE
Resp Syncytial Virus by PCR: NEGATIVE
SARS Coronavirus 2 by RT PCR: NEGATIVE

## 2023-06-21 LAB — TROPONIN I (HIGH SENSITIVITY)
Troponin I (High Sensitivity): 4 ng/L (ref <18)
Troponin I (High Sensitivity): 4 ng/L (ref <18)

## 2023-06-21 MED ORDER — ACETAMINOPHEN 500 MG PO TABS
1000.0000 mg | ORAL_TABLET | ORAL | Status: AC
  Administered 2023-06-21: 1000 mg via ORAL
  Filled 2023-06-21: qty 2

## 2023-06-21 NOTE — ED Notes (Signed)
Got patient on the monitor did EKG shown to er provider patient is resting with call bell in reach  

## 2023-06-21 NOTE — ED Provider Notes (Signed)
  Physical Exam  BP 106/61   Pulse (!) 102   Resp (!) 22   Ht 5\' 8"  (1.727 m)   Wt 70.8 kg   SpO2 100%   BMI 23.72 kg/m   Physical Exam  Procedures  Procedures  ED Course / MDM   Clinical Course as of 06/22/23 1032  Thu Jun 21, 2023  1517 Assumed care from Dr Andria Meuse. 53 yo M hx of IDDM and CAD wo stent who prsented with L sided chest pain with radiation to L arm. EKG with st depressions that is consistent with prior. Given aspirin by EMS.  [RP]  1842 Patient has a fever.  He was reassessed.  Says he has had some bit of chest pain and sore throat so COVID and flu were sent.  Does also have a right knee cellulitis and wound.  Does appear somewhat erythematous.  He has full range of motion of the joint without effusion that would be concerning for septic joint.  Has FU tm for his right knee wound in clinic.  Says he is currently on antibiotics for it. [RP]    Clinical Course User Index [RP] Rondel Baton, MD   Medical Decision Making Amount and/or Complexity of Data Reviewed Labs: ordered. Radiology: ordered.  Risk OTC drugs.   Second troponin returned and was WNL without significant change.  With the chest pain could potentially be due to some inflammation due to the flu.  Will have him follow-up with his primary doctor in several days as well as cardiology when he is feeling better.   Rondel Baton, MD 06/22/23 1034

## 2023-06-21 NOTE — ED Provider Notes (Signed)
EMERGENCY DEPARTMENT AT [facility] Provider Note  CSN: [id number] Arrival date & time: 06/21/23 1230  Chief Complaint(s) Chest Pain  HPI Joshua Escobar is a 53 y.o. male here today for chest pain.  Patient has history of prior MI, did not require a stent.  He says that this occurred 3 years ago on his birthday.  Patient says that he was at work and began to experience pain on the left side of his chest.  He took nitroglycerin which did not improve his symptoms.   Past Medical History Past Medical History:  Diagnosis Date   MI (myocardial infarction) (HCC) 10/2019   Migraine headache    Type II diabetes mellitus (HCC)    Patient Active Problem List   Diagnosis Date Noted   Hyperglycemia 06/14/2023   Open knee wound, right, initial encounter 06/14/2023   Encephalopathy 06/13/2023   Cellulitis 01/25/2023   Diabetic foot infection (HCC) 01/23/2023   Acute pancreatitis without necrosis or infection, unspecified 10/05/2022   Hypertension 09/11/2021   Bloating 09/30/2020   Hemorrhoids 09/14/2020   Symptomatic anemia 09/09/2020   IBS (irritable bowel syndrome) 11/19/2019   Type 2 diabetes mellitus with hyperglycemia, without long-term current use of insulin (HCC) 01/25/2019   Erectile dysfunction 06/20/2018   Tobacco abuse 08/28/2017   Chronic diarrhea 05/16/2017   DKA (diabetic ketoacidosis) (HCC) 05/15/2017   Back pain 12/28/2016   Diabetic neuropathy (HCC) 12/28/2016   Home Medication(s) Prior to Admission medications   Medication Sig Start Date End Date Taking? Authorizing Provider  insulin aspart (NOVOLOG) 100 UNIT/ML FlexPen Inject 10 Units into the skin 3 (three) times daily with meals. 06/14/23   Vassie Loll, MD  insulin glargine (LANTUS) 100 UNIT/ML Solostar Pen Inject 30 Units into the skin 2 (two) times daily. 06/14/23   Vassie Loll, MD  meclizine (ANTIVERT) 25 MG tablet Take 1 tablet (25 mg total) by mouth 3 (three) times daily as needed for  dizziness. 06/14/23   Vassie Loll, MD                                                                                                                                    Past Surgical History Past Surgical History:  Procedure Laterality Date   BIOPSY  11/16/2020   Procedure: BIOPSY;  Surgeon: Dolores Frame, MD;  [facility];  Service: Gastroenterology;;   COLONOSCOPY WITH PROPOFOL N/A 09/11/2020   Procedure: COLONOSCOPY WITH PROPOFOL;  Surgeon: Dolores Frame, MD;  [facility];  Service: Gastroenterology;  Laterality: N/A;   ESOPHAGOGASTRODUODENOSCOPY (EGD) WITH PROPOFOL N/A 11/16/2020   Procedure: ESOPHAGOGASTRODUODENOSCOPY (EGD) WITH PROPOFOL;  Surgeon: Dolores Frame, MD;  [facility];  Service: Gastroenterology;  Laterality: N/A;  12:10   LEFT HEART CATH AND CORONARY ANGIOGRAPHY N/A 11/27/2016   Procedure: Left Heart Cath and Coronary Angiography;  Surgeon: Runell Gess, MD;  Location: [facility] INVASIVE  CV LAB;  Service: Cardiovascular;  Laterality: N/A;   Family History Family History  Problem Relation Age of Onset   Diabetes Mellitus II Sister    Diabetes Sister    Diabetes Mellitus II Brother    Diabetes Brother    Diabetes Mellitus II Mother    Diabetes Mother    Diabetes Mellitus II Father    Diabetes Father    Heart disease Father     Social History Social History   Tobacco Use   Smoking status: Former    Current packs/day: 0.25    Average packs/day: 0.3 packs/day for 20.0 years (5.0 ttl pk-yrs)    Types: Cigarettes   Smokeless tobacco: Never  Vaping Use   Vaping status: Former   Substances: Nicotine, Flavoring  Substance Use Topics   Alcohol use: Yes    Alcohol/week: 1.0 standard drink of alcohol    Types: 1 Cans of beer per week    Comment: weekends only   Drug use: No   Allergies Patient has no known allergies.  Review of Systems Review of Systems  Physical Exam Vital Signs  I have  reviewed the triage vital signs BP (!) 118/55   Pulse (!) 102   Temp (!) 101.4 F (38.6 C) (Oral)   Resp 18   Ht 5\' 8"  (1.727 m)   Wt 70.8 kg   SpO2 98%   BMI 23.72 kg/m   Physical Exam Vitals reviewed.  Cardiovascular:     Rate and Rhythm: Normal rate.     Heart sounds: Normal heart sounds.  Pulmonary:     Effort: Pulmonary effort is normal.     Breath sounds: No decreased breath sounds.  Abdominal:     Palpations: Abdomen is soft.  Musculoskeletal:     Cervical back: Normal range of motion.  Neurological:     Mental Status: He is alert.     ED Results and Treatments Labs (all labs ordered are listed, but only abnormal results are displayed) Labs Reviewed  RESP PANEL BY RT-PCR (RSV, FLU A&B, COVID)  RVPGX2 - Abnormal; Notable for the following components:      Result Value   Influenza A by PCR POSITIVE (*)    All other components within normal limits  CBC - Abnormal; Notable for the following components:   Hemoglobin 11.6 (*)    HCT 36.3 (*)    MCH 25.6 (*)    All other components within normal limits  BASIC METABOLIC PANEL - Abnormal; Notable for the following components:   Glucose, Bld 171 (*)    Calcium 8.5 (*)    All other components within normal limits  TROPONIN I (HIGH SENSITIVITY)  TROPONIN I (HIGH SENSITIVITY)                                                                                                                          Radiology No results found.  Pertinent labs & imaging results that were available during my care  of the patient were reviewed by me and considered in my medical decision making (see MDM for details).  Medications Ordered in ED Medications  acetaminophen (TYLENOL) tablet 1,000 mg (1,000 mg Oral Given 06/21/23 1845)                                                                                                                                     Procedures Procedures  (including critical care time)  Medical Decision  Making / ED Course   This patient presents to the ED for concern of chest pain, this involves an extensive number of treatment options, and is a complaint that carries with it a high risk of complications and morbidity.  The differential diagnosis includes ACS, less likely PE, less likely dissection, musculoskeletal chest pain, pleuritic chest pain.  MDM: Patient with risk factors including prior MI, diabetes which is poorly controlled.  His EKG shows chronic ischemic changes.  Troponin ordered.  Chest x-ray ordered.  Patient's symptoms not consistent with PE or dissection.   Additional history obtained: -Additional history obtained from  -External records from outside source obtained and reviewed including: Chart review including previous notes, labs, imaging, consultation notes   Lab Tests: -I ordered, reviewed, and interpreted labs.   The pertinent results include:   Labs Reviewed  RESP PANEL BY RT-PCR (RSV, FLU A&B, COVID)  RVPGX2 - Abnormal; Notable for the following components:      Result Value   Influenza A by PCR POSITIVE (*)    All other components within normal limits  CBC - Abnormal; Notable for the following components:   Hemoglobin 11.6 (*)    HCT 36.3 (*)    MCH 25.6 (*)    All other components within normal limits  BASIC METABOLIC PANEL - Abnormal; Notable for the following components:   Glucose, Bld 171 (*)    Calcium 8.5 (*)    All other components within normal limits  TROPONIN I (HIGH SENSITIVITY)  TROPONIN I (HIGH SENSITIVITY)      EKG ST depressions in inferior leads, consistent with prior  EKG Interpretation Date/Time:  Thursday June 21 2023 12:38:54 EST Ventricular Rate:  101 PR Interval:  140 QRS Duration:  88 QT Interval:  322 QTC Calculation: 418 R Axis:   74  Text Interpretation: Sinus tachycardia Similar to June 13, 2023 LAE, consider biatrial enlargement Probable anteroseptal infarct, recent Lateral leads are also involved Confirmed by  Anders Simmonds [phone]) on 06/21/2023 1:42:14 PM         Imaging Studies ordered: I ordered imaging studies including chest x-ray I independently visualized and interpreted imaging. I agree with the radiologist interpretation   Medicines ordered and prescription drug management: Meds ordered this encounter  Medications   acetaminophen (TYLENOL) tablet 1,000 mg    -I have reviewed the patients home medicines and have made adjustments as needed   Cardiac Monitoring: The patient was maintained on a  cardiac monitor.  I personally viewed and interpreted the cardiac monitored which showed an underlying rhythm of: Normal sinus rhythm  Social Determinants of Health:  Factors impacting patients care include:    Reevaluation: After the interventions noted above, I reevaluated the patient and found that they have :improved  Co morbidities that complicate the patient evaluation  Past Medical History:  Diagnosis Date   MI (myocardial infarction) (HCC) 10/2019   Migraine headache    Type II diabetes mellitus (HCC)       Dispostion: Signed out to Dr. Jarold Motto     Final Clinical Impression(s) / ED Diagnoses Final diagnoses:  Influenza  Chest pain, unspecified type     @PCDICTATION @    Anders Simmonds T, DO 06/22/23 2300

## 2023-06-21 NOTE — Discharge Instructions (Signed)
You were seen for your chest pain and influenza in the emergency department.   At home, please take tylenol for your fevers.    Follow-up with your primary doctor in 2-3 days regarding your visit.  Cardiology will be calling you regarding an appointment within the next 72 hours.  You may contact them if you do not hear from them in that time using the information in this packet.  Return immediately to the emergency department if you experience any of the following: Worsening pain, difficulty breathing, unexplained vomiting or sweating, or any other concerning symptoms.    Thank you for visiting our Emergency Department. It was a pleasure taking care of you today.

## 2023-06-21 NOTE — ED Triage Notes (Signed)
Pt BIB [facility] from work d/t Lt sided CP with no radiation that started at 1100. 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 EMS transmitted 12L & STEMI was activated & was canceled en route to ED. Pt reports this pain does feel similar to the last heart attack he had. Rates his pain 6/10, A/Ox4, 124/84, 108 bpm, 97% on RA,  CBG 298, 18g Rt AC PIV.

## 2023-06-22 ENCOUNTER — Ambulatory Visit: Payer: Self-pay | Admitting: Family Medicine

## 2023-07-03 ENCOUNTER — Encounter: Payer: Self-pay | Admitting: Family Medicine

## 2023-07-03 ENCOUNTER — Ambulatory Visit (INDEPENDENT_AMBULATORY_CARE_PROVIDER_SITE_OTHER): Payer: Commercial Managed Care - PPO | Admitting: Family Medicine

## 2023-07-03 VITALS — BP 120/70 | HR 107 | Temp 98.3°F | Ht 68.0 in | Wt 160.2 lb

## 2023-07-03 DIAGNOSIS — Z794 Long term (current) use of insulin: Secondary | ICD-10-CM | POA: Diagnosis not present

## 2023-07-03 DIAGNOSIS — E1165 Type 2 diabetes mellitus with hyperglycemia: Secondary | ICD-10-CM | POA: Diagnosis not present

## 2023-07-03 MED ORDER — INSULIN LISPRO (1 UNIT DIAL) 100 UNIT/ML (KWIKPEN): 8.0000 [IU] | PEN_INJECTOR | Freq: Three times a day (TID) | SUBCUTANEOUS | 11 refills | Status: DC

## 2023-07-03 MED ORDER — FREESTYLE LIBRE 3 SENSOR MISC
1.0000 | 11 refills | Status: DC
Start: 2023-07-03 — End: 2023-10-23

## 2023-07-03 MED ORDER — CHOLESTYRAMINE 4 G PO PACK: 4.0000 g | PACK | Freq: Every evening | ORAL | 12 refills | Status: AC

## 2023-07-03 MED ORDER — SILDENAFIL CITRATE 100 MG PO TABS: 50.0000 mg | ORAL_TABLET | Freq: Every day | ORAL | 11 refills | Status: DC | PRN

## 2023-07-03 NOTE — Progress Notes (Signed)
 Subjective:    Patient ID: Joshua Escobar, male    DOB: Aug 29, 1970, 53 y.o.   MRN: [id number]  HPI 06/15/23 Cellulitis has resolved.  However explained to the patient the wound is not going to heal on his knees if his sugars are out of control.  I recommended that he cover the wound with Polysporin and a bandage on a daily basis until healed.  Meanwhile, I gave the patient 45 days worth of freestyle libre 3 continuous blood glucose monitoring equipment that he can monitor his sugars.  I encouraged him to take Lantus  30 units twice daily and NovoLog  15 units 3 times a day with meals.  I want to see him back in 1 week with the sugar values recorded so that we can titrate his insulin  further.  Patient states that his blood sugar this morning was 200 however he has only been on this new regimen of insulin  for 24 hours.  07/03/23 Patient missed his last appointment.  Patient is knee looks much better today.  The thick yellow eschar is slowly dissolving revealing healthy pink granulation tissue at the edges.  Using a razor blade, I bluntly dissected move the remaining yellow eschar revealing healthy granulation tissue and dermal tissue underneath.  There is no evidence of cellulitis.  However the patient reports hypoglycemic episodes.  He states that several hours after he eats, his sugars are bottoming out.  He is supposed to be on 15 units of NovoLog  with meals.  He is supposed to be taking 30 units of Lantus  twice a day.  He reduce his Lantus  to 25 mg twice a day because he was having hypoglycemic episodes early in the morning.  However when I reviewed his insulin , he is actually on Humulin  70/30.  Therefore he is taking a long-acting insulin  15 units 3 times daily in addition to the Lantus .  I believe that this explains his hypoglycemic episodes several hours after meals and his hypoglycemic episodes early in the morning between 3 and 6 AM as the long-acting portion of the Humulin  is taking its effect in  addition to Lantus . Past Medical History:  Diagnosis Date   MI (myocardial infarction) (HCC) 10/2019   Migraine headache    Type II diabetes mellitus (HCC)    Past Surgical History:  Procedure Laterality Date   BIOPSY  11/16/2020   Procedure: BIOPSY;  Surgeon: Eartha Angelia Sieving, MD;  [facility];  Service: Gastroenterology;;   COLONOSCOPY WITH PROPOFOL  N/A 09/11/2020   Procedure: COLONOSCOPY WITH PROPOFOL ;  Surgeon: Eartha Angelia Sieving, MD;  [facility];  Service: Gastroenterology;  Laterality: N/A;   ESOPHAGOGASTRODUODENOSCOPY (EGD) WITH PROPOFOL  N/A 11/16/2020   Procedure: ESOPHAGOGASTRODUODENOSCOPY (EGD) WITH PROPOFOL ;  Surgeon: Eartha Angelia Sieving, MD;  [facility];  Service: Gastroenterology;  Laterality: N/A;  12:10   LEFT HEART CATH AND CORONARY ANGIOGRAPHY N/A 11/27/2016   Procedure: Left Heart Cath and Coronary Angiography;  Surgeon: Court Dorn PARAS, MD;  Location: [facility] INVASIVE CV LAB;  Service: Cardiovascular;  Laterality: N/A;   Current Outpatient Medications on File Prior to Visit  Medication Sig Dispense Refill   insulin  aspart (NOVOLOG ) 100 UNIT/ML FlexPen Inject 10 Units into the skin 3 (three) times daily with meals. 15 mL 11   insulin  glargine (LANTUS ) 100 UNIT/ML Solostar Pen Inject 30 Units into the skin 2 (two) times daily. 15 mL 11   meclizine  (ANTIVERT ) 25 MG tablet Take 1 tablet (25 mg total) by mouth 3 (three)  times daily as needed for dizziness. 30 tablet 0   No current facility-administered medications on file prior to visit.   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    Social Drivers of Corporate Investment Banker Strain: Low Risk  (06/08/2023)   Overall Financial Resource Strain (CARDIA)    Difficulty of Paying Living Expenses: Not hard at all  Food Insecurity: Food Insecurity Present (06/08/2023)   Hunger Vital Sign    Worried About Running Out of Food in the Last Year: Sometimes true    Ran Out of Food in the Last Year: Sometimes true  Transportation Needs: No Transportation Needs (06/08/2023)   PRAPARE - Administrator, Civil Service (Medical): No    Lack of Transportation (Non-Medical): No  Physical Activity: Sufficiently Active (06/08/2023)   Exercise Vital Sign    Days of Exercise per Week: 7 days    Minutes of Exercise per Session: 60 min  Stress: No Stress Concern Present (06/08/2023)   [employer] of Occupational Health - Occupational Stress Questionnaire    Feeling of Stress : Not at all  Social Connections: Socially Isolated (06/08/2023)   Social Connection and Isolation Panel [NHANES]    Frequency of Communication with Friends and Family: More than three times a week    Frequency of Social Gatherings with Friends and Family: More than three times a week    Attends Religious Services: Never    Database Administrator or Organizations: No    Attends Engineer, Structural: Not on file    Marital Status: Divorced  Intimate Partner Violence: Not At Risk (01/24/2023)   Received from [employer], Afraid, Rape, and Kick questionnaire    Fear of Current or Ex-Partner: No    Emotionally Abused: No    Physically Abused: No    Sexually Abused: No        Review of Systems     Objective:   Physical Exam Vitals reviewed.  Constitutional:      Appearance: Normal appearance. He is normal weight.  Cardiovascular:     Rate and Rhythm: Normal rate and regular rhythm.     Heart sounds: Normal heart sounds.  Pulmonary:     Effort: Pulmonary effort is normal.     Breath sounds: Normal breath sounds.  Musculoskeletal:       Legs:  Skin:    Findings: Lesion present.  Neurological:     Mental Status: He is alert.  Assessment & Plan:  Type 2 diabetes mellitus with hyperglycemia, with long-term current use of insulin  (HCC) Reduce Lantus  to 25 units twice daily.  Discontinue Humulin  70/30 that the patient was taking from another physician.  Replace this with Humalog  8 units with meals.  I explained to the patient the concept of fasting blood sugar and postprandial sugars.  If he continues to have hypoglycemic episodes with postprandial sugars we will need to decrease Humalog  further.  He continues to have low fasting blood sugars we will need to decrease Lantus  further.  Patient will call me with an update on his sugars in a few weeks.  He also reports diarrhea and loose stool even at night when he is asleep.  This has been going on for years.  I will start the patient on cholestyramine  4 g at night to see if this will help stop the loose stools/diarrhea.

## 2023-07-04 ENCOUNTER — Ambulatory Visit: Payer: Self-pay | Admitting: Family Medicine

## 2023-07-04 NOTE — Telephone Encounter (Signed)
 Pls see CRM msg, and advise? Re: alternative Rx per pt?

## 2023-07-04 NOTE — Telephone Encounter (Signed)
 Copied from CRM [id number]. Topic: Clinical - Medication Question >> Jul 04, 2023 11:42 AM Tonda B wrote: Reason for CRM: sildenafil  (VIAGRA ) 100 MG tablet is  not covered under insurance he wants to know if there is something e;se that he can get that would be covered under his insurance please call patient back  [phone]   Chief Complaint: Medication Rx Request Disposition: [] ED /[] Urgent Care (no appt availability in office) / [] Appointment(In office/virtual)/ []  [facility]/ [] Home Care/ [] Refused Recommended Disposition /[] [facility]/ [x]  Follow-up with PCP Additional Notes: Patient is requesting an alternative medication due to lack of insurance coverage. Please contact patient.

## 2023-08-06 ENCOUNTER — Telehealth: Payer: Self-pay

## 2023-08-06 ENCOUNTER — Other Ambulatory Visit

## 2023-08-06 NOTE — Telephone Encounter (Signed)
 Pt came in to have pcp complete ppw for new employment. Pt can be contacted at [phone] when ppw is ready to be picked up. Ppw and intake form placed in nurse's folder.  Cb#: [phone]

## 2023-09-12 ENCOUNTER — Telehealth: Payer: Self-pay | Admitting: Family Medicine

## 2023-09-12 DIAGNOSIS — Z0279 Encounter for issue of other medical certificate: Secondary | ICD-10-CM

## 2023-09-12 NOTE — Telephone Encounter (Signed)
 Prescription Request  09/12/2023  LOV: 07/03/2023  What is the name of the medication or equipment?   insulin glargine (LANTUS) 100 UNIT/ML Solostar Pen  **patient stated this is his 2nd diabetes med and hasn't been sent to pharmacy for refill in a while**  Have you contacted your pharmacy to request a refill? Yes   Which pharmacy would you like this sent to?  [facility] [phone] - [place], [place] - [address] AT [facility] [address] [place] [address] Phone: [phone] Fax: [phone]    Patient notified that their request is being sent to the clinical staff for review and that they should receive a response within 2 business days.   Please advise at [phone].

## 2023-09-12 NOTE — Telephone Encounter (Signed)
 Patient came to office to pay form completion fee of $10 (cash). Completed form successfully faxed to patient's employer [employer]) with a confirmation time stamp of 09-12-2023 11:08.  Form and copy of payment receipt also successfully faxed to [employer] with a confirmation time stamp of 09-12-2023 11:14.  Gave patient original form with copy of confirmation sheet.

## 2023-09-13 ENCOUNTER — Other Ambulatory Visit: Payer: Self-pay

## 2023-09-13 DIAGNOSIS — E1165 Type 2 diabetes mellitus with hyperglycemia: Secondary | ICD-10-CM

## 2023-09-13 MED ORDER — INSULIN GLARGINE 100 UNIT/ML SOLOSTAR PEN
30.0000 [IU] | PEN_INJECTOR | Freq: Two times a day (BID) | SUBCUTANEOUS | 1 refills | Status: AC
Start: 2023-09-13 — End: ?

## 2023-10-01 ENCOUNTER — Ambulatory Visit: Admitting: Family Medicine

## 2023-10-04 ENCOUNTER — Telehealth: Payer: Self-pay

## 2023-10-04 NOTE — Telephone Encounter (Signed)
 Appointment for 10/05/23 has been rescheduled to 10/12/23. Pt is aware. Pt states he has been having issues with vertigo and asks if something can be sent in? Thanks.

## 2023-10-05 ENCOUNTER — Encounter (HOSPITAL_COMMUNITY): Payer: Self-pay

## 2023-10-05 ENCOUNTER — Ambulatory Visit: Admitting: Family Medicine

## 2023-10-05 ENCOUNTER — Other Ambulatory Visit: Payer: Self-pay | Admitting: Family Medicine

## 2023-10-05 MED ORDER — MECLIZINE HCL 25 MG PO TABS: 25.0000 mg | ORAL_TABLET | Freq: Three times a day (TID) | ORAL | 0 refills | Status: DC | PRN

## 2023-10-12 ENCOUNTER — Encounter: Payer: Self-pay | Admitting: Family Medicine

## 2023-10-12 ENCOUNTER — Ambulatory Visit: Admitting: Family Medicine

## 2023-10-12 VITALS — BP 120/78 | HR 93 | Temp 97.5°F | Ht 68.0 in | Wt 165.4 lb

## 2023-10-12 DIAGNOSIS — Z794 Long term (current) use of insulin: Secondary | ICD-10-CM | POA: Diagnosis not present

## 2023-10-12 DIAGNOSIS — E1165 Type 2 diabetes mellitus with hyperglycemia: Secondary | ICD-10-CM

## 2023-10-12 NOTE — Progress Notes (Signed)
 Subjective:    Patient ID: Joshua Escobar, male    DOB: 1970/10/02, 53 y.o.   MRN: [id number]  HPI Patient is here today to recheck his hemoglobin A1c.  He states that he is using Lantus  30 units twice daily.  He is also using Humalog  8 units with meals 3 times a day.  He states that his fasting sugars are usually around 3:00 in the afternoon.  These are typically near 200.  However his 2-hour postprandial sugars after meals are typically 90-120.  He has had 2 episodes of hypoglycemia into the 50s.  He admits that he is not checking his sugars very often. Past Medical History:  Diagnosis Date   MI (myocardial infarction) (HCC) 10/2019   Migraine headache    Type II diabetes mellitus (HCC)    Past Surgical History:  Procedure Laterality Date   BIOPSY  11/16/2020   Procedure: BIOPSY;  Surgeon: Urban Garden, MD;  Location: [facility];  Service: Gastroenterology;;   COLONOSCOPY WITH PROPOFOL  N/A 09/11/2020   Procedure: COLONOSCOPY WITH PROPOFOL ;  Surgeon: Urban Garden, MD;  Location: [facility];  Service: Gastroenterology;  Laterality: N/A;   ESOPHAGOGASTRODUODENOSCOPY (EGD) WITH PROPOFOL  N/A 11/16/2020   Procedure: ESOPHAGOGASTRODUODENOSCOPY (EGD) WITH PROPOFOL ;  Surgeon: Urban Garden, MD;  Location: [facility];  Service: Gastroenterology;  Laterality: N/A;  12:10   LEFT HEART CATH AND CORONARY ANGIOGRAPHY N/A 11/27/2016   Procedure: Left Heart Cath and Coronary Angiography;  Surgeon: Avanell Leigh, MD;  Location: [facility];  Service: Cardiovascular;  Laterality: N/A;   Current Outpatient Medications on File Prior to Visit  Medication Sig Dispense Refill   cholestyramine  (QUESTRAN ) 4 g packet Take 1 packet (4 g total) by mouth at bedtime. 30 each 12   Continuous Glucose Sensor (FREESTYLE LIBRE 3 SENSOR) MISC 1 Device by Does not apply route every 14 (fourteen) days. Place 1 sensor on the skin every 14 days. Use to check glucose  continuously 2 each 11   insulin  glargine (LANTUS ) 100 UNIT/ML Solostar Pen Inject 30 Units into the skin 2 (two) times daily. Appointment needed. 15 mL 1   insulin  lispro (HUMALOG  KWIKPEN) 100 UNIT/ML KwikPen Inject 8 Units into the skin 3 (three) times daily. 15 mL 11   meclizine  (ANTIVERT ) 25 MG tablet Take 1 tablet (25 mg total) by mouth 3 (three) times daily as needed for dizziness (vertigo). 30 tablet 0   sildenafil  (VIAGRA ) 100 MG tablet Take 0.5-1 tablets (50-100 mg total) by mouth daily as needed for erectile dysfunction. 5 tablet 11   No current facility-administered medications on file prior to visit.   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    Social Drivers of Corporate investment banker Strain: Low Risk  (06/08/2023)   Overall Financial Resource Strain (CARDIA)    Difficulty of Paying Living Expenses: Not hard at all  Food Insecurity: Food Insecurity Present (06/08/2023)   Hunger Vital Sign    Worried About Running Out of Food in the Last Year: Sometimes true    Ran Out of Food in the Last Year: Sometimes true  Transportation  Needs: No Transportation Needs (06/08/2023)   PRAPARE - Administrator, Civil Service (Medical): No    Lack of Transportation (Non-Medical): No  Physical Activity: Sufficiently Active (06/08/2023)   Exercise Vital Sign    Days of Exercise per Week: 7 days    Minutes of Exercise per Session: 60 min  Stress: No Stress Concern Present (06/08/2023)   [employer] of Occupational Health - Occupational Stress Questionnaire    Feeling of Stress : Not at all  Social Connections: Socially Isolated (06/08/2023)   Social Connection and Isolation Panel [NHANES]    Frequency of Communication with Friends and Family: More than three times a week    Frequency of Social Gatherings with Friends and Family: More than three times a week    Attends Religious Services: Never    Database administrator or Organizations: No    Attends Engineer, structural: Not on file    Marital Status: Divorced  Intimate Partner Violence: Not At Risk (01/24/2023)   Received from [employer], Afraid, Rape, and Kick questionnaire    Fear of Current or Ex-Partner: No    Emotionally Abused: No    Physically Abused: No    Sexually Abused: No        Review of Systems     Objective:   Physical Exam Vitals reviewed.  Constitutional:      Appearance: Normal appearance. He is normal weight.  Cardiovascular:     Rate and Rhythm: Normal rate and regular rhythm.     Heart sounds: Normal heart sounds.  Pulmonary:     Effort: Pulmonary effort is normal.     Breath sounds: Normal breath sounds.  Neurological:     Mental Status: He is alert.           Assessment & Plan:  Type 2 diabetes mellitus with hyperglycemia, with long-term current use of insulin  (HCC) - Plan: CBC with Differential/Platelet, Comprehensive metabolic panel with GFR, Hemoglobin A1c I will like to check a CBC a CMP and a hemoglobin A1c.  Goal hemoglobin A1c is less than 7.  I encouraged the patient to start  checking his sugars using the continuous blood glucose monitoring device that he has so we can get a better idea of how well-controlled his sugars are and when they seem to run the highest.  At the present time his fasting sugars sound elevated.  If his A1c is elevated I will plan to increase his Lantus  to compensate for this.  His 2-hour postprandial sugars are well-controlled.  Therefore I believe that the dose of Humalog  he is taking is sufficient.

## 2023-10-13 LAB — COMPREHENSIVE METABOLIC PANEL WITH GFR
AG Ratio: 1.4 (calc) (ref 1.0–2.5)
ALT: 19 U/L (ref 9–46)
AST: 17 U/L (ref 10–35)
Albumin: 3.9 g/dL (ref 3.6–5.1)
Alkaline phosphatase (APISO): 76 U/L (ref 35–144)
BUN: 20 mg/dL (ref 7–25)
CO2: 29 mmol/L (ref 20–32)
Calcium: 9.1 mg/dL (ref 8.6–10.3)
Chloride: 105 mmol/L (ref 98–110)
Creat: 0.95 mg/dL (ref 0.70–1.30)
Globulin: 2.8 g/dL (ref 1.9–3.7)
Glucose, Bld: 274 mg/dL — ABNORMAL HIGH (ref 65–99)
Potassium: 3.8 mmol/L (ref 3.5–5.3)
Sodium: 139 mmol/L (ref 135–146)
Total Bilirubin: 0.3 mg/dL (ref 0.2–1.2)
Total Protein: 6.7 g/dL (ref 6.1–8.1)
eGFR: 97 mL/min/{1.73_m2} (ref >=60)

## 2023-10-13 LAB — CBC WITH DIFFERENTIAL/PLATELET
Absolute Lymphocytes: 3803 {cells}/uL (ref 850–3900)
Absolute Monocytes: 660 {cells}/uL (ref 200–950)
Basophils Absolute: 53 {cells}/uL (ref 0–200)
Basophils Relative: 0.7 %
Eosinophils Absolute: 233 {cells}/uL (ref 15–500)
Eosinophils Relative: 3.1 %
HCT: 42.6 % (ref 38.5–50.0)
Hemoglobin: 12.8 g/dL — ABNORMAL LOW (ref 13.2–17.1)
MCH: 25.2 pg — ABNORMAL LOW (ref 27.0–33.0)
MCHC: 30 g/dL — ABNORMAL LOW (ref 32.0–36.0)
MCV: 84 fL (ref 80.0–100.0)
MPV: 11.1 fL (ref 7.5–12.5)
Monocytes Relative: 8.8 %
Neutro Abs: 2753 {cells}/uL (ref 1500–7800)
Neutrophils Relative %: 36.7 %
Platelets: 224 10*3/uL (ref 140–400)
RBC: 5.07 10*6/uL (ref 4.20–5.80)
RDW: 14.5 % (ref 11.0–15.0)
Total Lymphocyte: 50.7 %
WBC: 7.5 10*3/uL (ref 3.8–10.8)

## 2023-10-13 LAB — HEMOGLOBIN A1C
Hgb A1c MFr Bld: 11.9 % — ABNORMAL HIGH (ref <5.7)
Mean Plasma Glucose: 295 mg/dL
eAG (mmol/L): 16.3 mmol/L

## 2023-10-15 ENCOUNTER — Other Ambulatory Visit: Payer: Self-pay

## 2023-10-15 ENCOUNTER — Ambulatory Visit: Payer: Self-pay | Admitting: Family Medicine

## 2023-10-15 DIAGNOSIS — E1165 Type 2 diabetes mellitus with hyperglycemia: Secondary | ICD-10-CM

## 2023-10-23 ENCOUNTER — Other Ambulatory Visit: Payer: Self-pay | Admitting: Family Medicine

## 2023-10-23 ENCOUNTER — Other Ambulatory Visit (HOSPITAL_COMMUNITY): Payer: Self-pay

## 2023-10-23 ENCOUNTER — Telehealth: Payer: Self-pay | Admitting: Pharmacy Technician

## 2023-10-23 MED ORDER — FREESTYLE LIBRE 3 PLUS SENSOR MISC: 11 refills | Status: DC

## 2023-10-23 NOTE — Telephone Encounter (Signed)
 Pharmacy Patient Advocate Encounter   Received notification from Onbase that prior authorization for FREESTYLE LIBRE 3 SENSORS  is required/requested.   Insurance verification completed.   The patient is insured through [employer] .   Per test claim:  FREESTYLE LIBRE 3 PLUS SENSORS is preferred by the insurance.  If suggested medication is appropriate, Please send in a new RX and discontinue this one. If not, please advise as to why it's not appropriate so that we may request a Prior Authorization. Please note, some preferred medications may still require a PA.  If the suggested medications have not been trialed and there are no contraindications to their use, the PA will not be submitted, as it will not be approved.  I called the pt's pharmacy and spoke with [facility] who informed me that the [facility] is being DC'd and that a new prescription for Freestyle Libre 3 Plus is needed to be sent to the pharmacy.   Thanks

## 2023-10-25 ENCOUNTER — Other Ambulatory Visit (HOSPITAL_COMMUNITY): Payer: Self-pay

## 2023-10-26 ENCOUNTER — Other Ambulatory Visit: Payer: Self-pay

## 2023-10-26 ENCOUNTER — Emergency Department (HOSPITAL_BASED_OUTPATIENT_CLINIC_OR_DEPARTMENT_OTHER)

## 2023-10-26 ENCOUNTER — Other Ambulatory Visit (HOSPITAL_COMMUNITY): Payer: Self-pay

## 2023-10-26 ENCOUNTER — Encounter (HOSPITAL_BASED_OUTPATIENT_CLINIC_OR_DEPARTMENT_OTHER): Payer: Self-pay

## 2023-10-26 ENCOUNTER — Emergency Department (HOSPITAL_BASED_OUTPATIENT_CLINIC_OR_DEPARTMENT_OTHER)
Admission: EM | Admit: 2023-10-26 | Discharge: 2023-10-27 | Disposition: A | Discharge status: Home / Self Care | Attending: Emergency Medicine | Admitting: Emergency Medicine

## 2023-10-26 DIAGNOSIS — R1013 Epigastric pain: Secondary | ICD-10-CM | POA: Insufficient documentation

## 2023-10-26 DIAGNOSIS — Z794 Long term (current) use of insulin: Secondary | ICD-10-CM | POA: Insufficient documentation

## 2023-10-26 DIAGNOSIS — K859 Acute pancreatitis without necrosis or infection, unspecified: Secondary | ICD-10-CM

## 2023-10-26 DIAGNOSIS — R1011 Right upper quadrant pain: Secondary | ICD-10-CM | POA: Diagnosis not present

## 2023-10-26 DIAGNOSIS — R1012 Left upper quadrant pain: Secondary | ICD-10-CM | POA: Insufficient documentation

## 2023-10-26 DIAGNOSIS — R1010 Upper abdominal pain, unspecified: Secondary | ICD-10-CM | POA: Diagnosis present

## 2023-10-26 LAB — CBC
HCT: 44.2 % (ref 39.0–52.0)
Hemoglobin: 13.8 g/dL (ref 13.0–17.0)
MCH: 25.3 pg — ABNORMAL LOW (ref 26.0–34.0)
MCHC: 31.2 g/dL (ref 30.0–36.0)
MCV: 81.1 fL (ref 80.0–100.0)
Platelets: 251 10*3/uL (ref 150–400)
RBC: 5.45 MIL/uL (ref 4.22–5.81)
RDW: 14.3 % (ref 11.5–15.5)
WBC: 6.7 10*3/uL (ref 4.0–10.5)
nRBC: 0 % (ref 0.0–0.2)

## 2023-10-26 LAB — COMPREHENSIVE METABOLIC PANEL WITH GFR
ALT: 17 U/L (ref 0–44)
AST: 19 U/L (ref 15–41)
Albumin: 4.2 g/dL (ref 3.5–5.0)
Alkaline Phosphatase: 103 U/L (ref 38–126)
Anion gap: 14 (ref 5–15)
BUN: 23 mg/dL — ABNORMAL HIGH (ref 6–20)
CO2: 22 mmol/L (ref 22–32)
Calcium: 9.7 mg/dL (ref 8.9–10.3)
Chloride: 100 mmol/L (ref 98–111)
Creatinine, Ser: 0.97 mg/dL (ref 0.61–1.24)
GFR, Estimated: >60 mL/min (ref >60)
Glucose, Bld: 177 mg/dL — ABNORMAL HIGH (ref 70–99)
Potassium: 3.9 mmol/L (ref 3.5–5.1)
Sodium: 136 mmol/L (ref 135–145)
Total Bilirubin: 0.3 mg/dL (ref 0.0–1.2)
Total Protein: 7.7 g/dL (ref 6.5–8.1)

## 2023-10-26 LAB — URINALYSIS, ROUTINE W REFLEX MICROSCOPIC
Bacteria, UA: NONE SEEN
Bilirubin Urine: NEGATIVE
Glucose, UA: 500 mg/dL — AB
Ketones, ur: NEGATIVE mg/dL
Leukocytes,Ua: NEGATIVE
Nitrite: NEGATIVE
Specific Gravity, Urine: 1.021 (ref 1.005–1.030)
pH: 5.5 (ref 5.0–8.0)

## 2023-10-26 LAB — LIPASE, BLOOD: Lipase: 806 U/L — ABNORMAL HIGH (ref 11–51)

## 2023-10-26 LAB — CBG MONITORING, ED: Glucose-Capillary: 190 mg/dL — ABNORMAL HIGH (ref 70–99)

## 2023-10-26 MED ORDER — ONDANSETRON HCL 4 MG/2ML IJ SOLN
4.0000 mg | Freq: Once | INTRAMUSCULAR | Status: AC
  Administered 2023-10-26: 4 mg via INTRAVENOUS
  Filled 2023-10-26: qty 2

## 2023-10-26 MED ORDER — IOHEXOL 300 MG/ML  SOLN
100.0000 mL | Freq: Once | INTRAMUSCULAR | Status: AC | PRN
  Administered 2023-10-27: 100 mL via INTRAVENOUS

## 2023-10-26 MED ORDER — SODIUM CHLORIDE 0.9 % IV BOLUS
1000.0000 mL | Freq: Once | INTRAVENOUS | Status: AC
  Administered 2023-10-26: 1000 mL via INTRAVENOUS

## 2023-10-26 MED ORDER — HYDROMORPHONE HCL 1 MG/ML IJ SOLN
1.0000 mg | Freq: Once | INTRAMUSCULAR | Status: AC
  Administered 2023-10-26: 1 mg via INTRAVENOUS
  Filled 2023-10-26: qty 1

## 2023-10-26 NOTE — ED Provider Notes (Signed)
 [facility] EMERGENCY DEPARTMENT AT [facility] Provider Note   CSN: [id number] Arrival date & time: 10/26/23  1811     History  Chief Complaint  Patient presents with   Abdominal Pain   Emesis    Joshua Escobar is a 53 y.o. male.  HPI   53 year old male with past medical history of pancreatitis and gallstones presents to the emergency department with concern for upper abdominal pain, nausea/vomiting.  He states has been going on for the past 3 days.  States the pain is a combination of cramping and sharp.  Patient denies any blood in the stool or difficulty with urination.  States that he has felt hot but no documented fever.  Home Medications Prior to Admission medications   Medication Sig Start Date End Date Taking? Authorizing Provider  cholestyramine  (QUESTRAN ) 4 g packet Take 1 packet (4 g total) by mouth at bedtime. 07/03/23   Austine Lefort, MD  Continuous Glucose Sensor (FREESTYLE LIBRE 3 PLUS SENSOR) MISC Change sensor every 15 days. 10/23/23   Austine Lefort, MD  insulin  glargine (LANTUS ) 100 UNIT/ML Solostar Pen Inject 30 Units into the skin 2 (two) times daily. Appointment needed. 09/13/23   Austine Lefort, MD  insulin  lispro (HUMALOG  KWIKPEN) 100 UNIT/ML KwikPen Inject 8 Units into the skin 3 (three) times daily. 07/03/23   Austine Lefort, MD  meclizine  (ANTIVERT ) 25 MG tablet Take 1 tablet (25 mg total) by mouth 3 (three) times daily as needed for dizziness (vertigo). 10/05/23   Austine Lefort, MD  sildenafil  (VIAGRA ) 100 MG tablet Take 0.5-1 tablets (50-100 mg total) by mouth daily as needed for erectile dysfunction. 07/03/23   Austine Lefort, MD      Allergies    Patient has no known allergies.    Review of Systems   Review of Systems  Constitutional:  Negative for fever.  Respiratory:  Negative for shortness of breath.   Cardiovascular:  Negative for chest pain.  Gastrointestinal:  Positive for abdominal pain, nausea and vomiting. Negative  for blood in stool and diarrhea.  Genitourinary:  Negative for dysuria.  Skin:  Negative for rash.  Neurological:  Negative for headaches.    Physical Exam Updated Vital Signs BP (!) 140/93   Pulse 92   Temp 98.3 F (36.8 C) (Oral)   Resp 18   Ht 5\' 8"  (1.727 m)   Wt 73.9 kg   SpO2 94%   BMI 24.78 kg/m  Physical Exam Vitals and nursing note reviewed.  Constitutional:      General: He is not in acute distress.    Appearance: Normal appearance.  HENT:     Head: Normocephalic.     Mouth/Throat:     Mouth: Mucous membranes are moist.  Cardiovascular:     Rate and Rhythm: Normal rate.  Pulmonary:     Effort: Pulmonary effort is normal. No respiratory distress.  Abdominal:     General: There is no distension.     Palpations: Abdomen is soft.     Tenderness: There is abdominal tenderness in the right upper quadrant, epigastric area and left upper quadrant. Negative signs include Murphy's sign.  Skin:    General: Skin is warm.  Neurological:     Mental Status: He is alert and oriented to person, place, and time. Mental status is at baseline.  Psychiatric:        Mood and Affect: Mood normal.     ED Results / Procedures / Treatments  Labs (all labs ordered are listed, but only abnormal results are displayed) Labs Reviewed  LIPASE, BLOOD - Abnormal; Notable for the following components:      Result Value   Lipase 806 (*)    All other components within normal limits  COMPREHENSIVE METABOLIC PANEL WITH GFR - Abnormal; Notable for the following components:   Glucose, Bld 177 (*)    BUN 23 (*)    All other components within normal limits  CBC - Abnormal; Notable for the following components:   MCH 25.3 (*)    All other components within normal limits  URINALYSIS, ROUTINE W REFLEX MICROSCOPIC - Abnormal; Notable for the following components:   Glucose, UA 500 (*)    Hgb urine dipstick TRACE (*)    Protein, ur TRACE (*)    All other components within normal limits   CBG MONITORING, ED - Abnormal; Notable for the following components:   Glucose-Capillary 190 (*)    All other components within normal limits    EKG None  Radiology No results found.  Procedures Procedures    Medications Ordered in ED Medications  sodium chloride  0.9 % bolus 1,000 mL (1,000 mLs Intravenous New Bag/Given 10/26/23 2309)  ondansetron  (ZOFRAN ) injection 4 mg (4 mg Intravenous Given 10/26/23 2309)  HYDROmorphone (DILAUDID) injection 1 mg (1 mg Intravenous Given 10/26/23 2310)    ED Course/ Medical Decision Making/ A&P Clinical Course as of 10/26/23 2320  Fri Oct 26, 2023  2303 Stable 52 YOM with hx of pancreatitis.  Abdominal pain CT pending [CC]    Clinical Course User Index [CC] Onetha Bile, MD                                 Medical Decision Making Amount and/or Complexity of Data Reviewed Labs: ordered. Radiology: ordered.  Risk Prescription drug management.   53 year old male presents emergency department upper abdominal pain, nausea/vomiting.  He is afebrile, stable vitals on arrival.  Tender upper abdomen, mainly epigastric and left upper quadrant, negative Murphy sign.  Blood work shows no leukocytosis, does identify an elevated lipase but normal LFTs and bilirubin.  Will plan for IV medication and treatment as well as a CT of the abdomen pelvis.  Patient signed out pending these results.        Final Clinical Impression(s) / ED Diagnoses Final diagnoses:  None    Rx / DC Orders ED Discharge Orders     None         Flonnie Humphrey, DO 10/26/23 2323

## 2023-10-26 NOTE — ED Triage Notes (Signed)
 Arrives POV with complaints of right side abdominal and vomiting while eating x3 days.  Rates pain a 7/10.

## 2023-10-26 NOTE — ED Provider Notes (Signed)
 Care of patient received from prior provider at 11:03 PM, please see their note for complete H/P and care plan.  Received handoff per ED course.  Clinical Course as of 10/26/23 2303  Fri Oct 26, 2023  2303 Stable 52 YOM with hx of pancreatitis.  Abdominal pain CT pending [CC]    Clinical Course User Index [CC] Onetha Bile, MD    Reassessment: CT scan demonstrated pancreatitis.  Patient has been resting comfortably.  He is now feels comfortable with outpatient care management ambulatory tolerating p.o. intake.  Disposition:  I have considered need for hospitalization, however, considering all of the above, I believe this patient is stable for discharge at this time.  Patient/family educated about specific return precautions for given chief complaint and symptoms.  Patient/family educated about follow-up with PC.     Patient/family expressed understanding of return precautions and need for follow-up. Patient spoken to regarding all imaging and laboratory results and appropriate follow up for these results. All education provided in verbal form with additional information in written form. Time was allowed for answering of patient questions. Patient discharged.    Emergency Department Medication Summary:   Medications  sodium chloride  0.9 % bolus 1,000 mL (0 mLs Intravenous Stopped 10/27/23 0055)  ondansetron  (ZOFRAN ) injection 4 mg (4 mg Intravenous Given 10/26/23 2309)  HYDROmorphone  (DILAUDID ) injection 1 mg (1 mg Intravenous Given 10/26/23 2310)  iohexol  (OMNIPAQUE ) 300 MG/ML solution 100 mL (100 mLs Intravenous Contrast Given 10/27/23 0006)            Onetha Bile, MD 10/27/23 [phone]

## 2023-10-27 MED ORDER — ONDANSETRON HCL 4 MG PO TABS: 4.0000 mg | ORAL_TABLET | Freq: Four times a day (QID) | ORAL | 0 refills | Status: DC | PRN

## 2023-10-27 MED ORDER — OXYCODONE HCL 5 MG PO TABS: 5.0000 mg | ORAL_TABLET | Freq: Four times a day (QID) | ORAL | 0 refills | Status: DC | PRN

## 2023-10-29 ENCOUNTER — Telehealth: Payer: Self-pay | Admitting: Family Medicine

## 2023-10-29 NOTE — Telephone Encounter (Signed)
 Copied from CRM [id number]. Topic: General - Other >> Oct 29, 2023  2:48 PM Elle L wrote: Reason for CRM: The patient states his employer is requesting a letter stating it is medically necessary for him to use his Continuous Glucose Sensor (FREESTYLE LIBRE 3 PLUS SENSOR) MISC. Their fax number is [phone] for [employer]. The patient's call back number is [phone].

## 2023-10-30 ENCOUNTER — Encounter: Payer: Self-pay | Admitting: Family Medicine

## 2023-10-31 ENCOUNTER — Inpatient Hospital Stay (HOSPITAL_BASED_OUTPATIENT_CLINIC_OR_DEPARTMENT_OTHER)
Admission: EM | Admit: 2023-10-31 | Discharge: 2023-11-02 | DRG: 440 | Disposition: A | Discharge status: Home / Self Care | Attending: Internal Medicine | Admitting: Internal Medicine

## 2023-10-31 ENCOUNTER — Other Ambulatory Visit: Payer: Self-pay

## 2023-10-31 ENCOUNTER — Telehealth: Payer: Self-pay

## 2023-10-31 ENCOUNTER — Emergency Department (HOSPITAL_BASED_OUTPATIENT_CLINIC_OR_DEPARTMENT_OTHER)

## 2023-10-31 DIAGNOSIS — Z87891 Personal history of nicotine dependence: Secondary | ICD-10-CM

## 2023-10-31 DIAGNOSIS — Z833 Family history of diabetes mellitus: Secondary | ICD-10-CM

## 2023-10-31 DIAGNOSIS — K8500 Idiopathic acute pancreatitis without necrosis or infection: Principal | ICD-10-CM | POA: Diagnosis present

## 2023-10-31 DIAGNOSIS — Z794 Long term (current) use of insulin: Secondary | ICD-10-CM

## 2023-10-31 DIAGNOSIS — E1165 Type 2 diabetes mellitus with hyperglycemia: Secondary | ICD-10-CM | POA: Diagnosis present

## 2023-10-31 DIAGNOSIS — K859 Acute pancreatitis without necrosis or infection, unspecified: Principal | ICD-10-CM

## 2023-10-31 DIAGNOSIS — I252 Old myocardial infarction: Secondary | ICD-10-CM

## 2023-10-31 DIAGNOSIS — Z8249 Family history of ischemic heart disease and other diseases of the circulatory system: Secondary | ICD-10-CM

## 2023-10-31 DIAGNOSIS — I10 Essential (primary) hypertension: Secondary | ICD-10-CM | POA: Diagnosis present

## 2023-10-31 DIAGNOSIS — E785 Hyperlipidemia, unspecified: Secondary | ICD-10-CM | POA: Diagnosis present

## 2023-10-31 LAB — COMPREHENSIVE METABOLIC PANEL WITH GFR
ALT: 44 U/L (ref 0–44)
AST: 21 U/L (ref 15–41)
Albumin: 4.1 g/dL (ref 3.5–5.0)
Alkaline Phosphatase: 175 U/L — ABNORMAL HIGH (ref 38–126)
Anion gap: 14 (ref 5–15)
BUN: 14 mg/dL (ref 6–20)
CO2: 25 mmol/L (ref 22–32)
Calcium: 9.9 mg/dL (ref 8.9–10.3)
Chloride: 96 mmol/L — ABNORMAL LOW (ref 98–111)
Creatinine, Ser: 1.07 mg/dL (ref 0.61–1.24)
GFR, Estimated: >60 mL/min (ref >60)
Glucose, Bld: 360 mg/dL — ABNORMAL HIGH (ref 70–99)
Potassium: 4.5 mmol/L (ref 3.5–5.1)
Sodium: 135 mmol/L (ref 135–145)
Total Bilirubin: 0.4 mg/dL (ref 0.0–1.2)
Total Protein: 7.9 g/dL (ref 6.5–8.1)

## 2023-10-31 LAB — URINALYSIS, ROUTINE W REFLEX MICROSCOPIC
Bacteria, UA: NONE SEEN
Bilirubin Urine: NEGATIVE
Glucose, UA: NEGATIVE mg/dL
Leukocytes,Ua: NEGATIVE
Nitrite: NEGATIVE
Specific Gravity, Urine: 1.022 (ref 1.005–1.030)
pH: 7 (ref 5.0–8.0)

## 2023-10-31 LAB — CBC
HCT: 46.3 % (ref 39.0–52.0)
Hemoglobin: 14.6 g/dL (ref 13.0–17.0)
MCH: 25.2 pg — ABNORMAL LOW (ref 26.0–34.0)
MCHC: 31.5 g/dL (ref 30.0–36.0)
MCV: 79.8 fL — ABNORMAL LOW (ref 80.0–100.0)
Platelets: 315 10*3/uL (ref 150–400)
RBC: 5.8 MIL/uL (ref 4.22–5.81)
RDW: 13.9 % (ref 11.5–15.5)
WBC: 6.1 10*3/uL (ref 4.0–10.5)
nRBC: 0 % (ref 0.0–0.2)

## 2023-10-31 LAB — GLUCOSE, CAPILLARY: Glucose-Capillary: 330 mg/dL — ABNORMAL HIGH (ref 70–99)

## 2023-10-31 LAB — LIPASE, BLOOD: Lipase: 900 U/L — ABNORMAL HIGH (ref 11–51)

## 2023-10-31 LAB — TRIGLYCERIDES: Triglycerides: 102 mg/dL (ref <150)

## 2023-10-31 MED ORDER — ONDANSETRON HCL 4 MG/2ML IJ SOLN
4.0000 mg | Freq: Once | INTRAMUSCULAR | Status: AC
  Administered 2023-10-31: 4 mg via INTRAVENOUS
  Filled 2023-10-31: qty 2

## 2023-10-31 MED ORDER — INSULIN ASPART 100 UNIT/ML IJ SOLN
0.0000 [IU] | INTRAMUSCULAR | Status: DC
  Administered 2023-10-31: 11 [IU] via SUBCUTANEOUS
  Administered 2023-11-01: 2 [IU] via SUBCUTANEOUS

## 2023-10-31 MED ORDER — OXYCODONE HCL 5 MG PO TABS
5.0000 mg | ORAL_TABLET | ORAL | Status: DC | PRN
  Administered 2023-11-01: 5 mg via ORAL
  Filled 2023-10-31: qty 1

## 2023-10-31 MED ORDER — SODIUM CHLORIDE 0.9 % IV SOLN: INTRAVENOUS | Status: DC

## 2023-10-31 MED ORDER — PANTOPRAZOLE SODIUM 40 MG IV SOLR
40.0000 mg | Freq: Once | INTRAVENOUS | Status: AC
  Administered 2023-10-31: 40 mg via INTRAVENOUS
  Filled 2023-10-31: qty 10

## 2023-10-31 MED ORDER — HYDROMORPHONE HCL 1 MG/ML IJ SOLN
0.5000 mg | Freq: Once | INTRAMUSCULAR | Status: AC
  Administered 2023-10-31: 0.5 mg via INTRAVENOUS
  Filled 2023-10-31: qty 1

## 2023-10-31 MED ORDER — SODIUM CHLORIDE 0.9% FLUSH
3.0000 mL | Freq: Two times a day (BID) | INTRAVENOUS | Status: DC
  Administered 2023-10-31 – 2023-11-02 (×4): 3 mL via INTRAVENOUS

## 2023-10-31 MED ORDER — ENOXAPARIN SODIUM 40 MG/0.4ML IJ SOSY
40.0000 mg | PREFILLED_SYRINGE | INTRAMUSCULAR | Status: DC
  Administered 2023-10-31 – 2023-11-01 (×2): 40 mg via SUBCUTANEOUS
  Filled 2023-10-31 (×2): qty 0.4

## 2023-10-31 MED ORDER — ACETAMINOPHEN 325 MG PO TABS: 650.0000 mg | ORAL_TABLET | Freq: Four times a day (QID) | ORAL | Status: DC | PRN

## 2023-10-31 MED ORDER — SODIUM CHLORIDE 0.9 % IV BOLUS
1000.0000 mL | Freq: Once | INTRAVENOUS | Status: AC
  Administered 2023-10-31: 1000 mL via INTRAVENOUS

## 2023-10-31 MED ORDER — ACETAMINOPHEN 650 MG RE SUPP
650.0000 mg | Freq: Four times a day (QID) | RECTAL | Status: DC | PRN
Start: 2023-10-31 — End: 2023-11-01

## 2023-10-31 MED ORDER — HYDROMORPHONE HCL 1 MG/ML IJ SOLN
1.0000 mg | INTRAMUSCULAR | Status: DC | PRN
  Administered 2023-11-01 (×3): 1 mg via INTRAVENOUS
  Filled 2023-10-31 (×3): qty 1

## 2023-10-31 MED ORDER — ONDANSETRON HCL 4 MG/2ML IJ SOLN: 4.0000 mg | Freq: Four times a day (QID) | INTRAMUSCULAR | Status: DC | PRN

## 2023-10-31 MED ORDER — HYDROMORPHONE HCL 1 MG/ML IJ SOLN
1.0000 mg | Freq: Once | INTRAMUSCULAR | Status: AC
  Administered 2023-10-31: 1 mg via INTRAVENOUS
  Filled 2023-10-31: qty 1

## 2023-10-31 NOTE — Telephone Encounter (Signed)
 Fax received from [employer] to complete PA for pt's CGM. Form faxed back to [employer] at [phone]. Mjp,lpn

## 2023-10-31 NOTE — ED Notes (Signed)
[facility] called to transport 

## 2023-10-31 NOTE — Plan of Care (Signed)
 53 yo male with history of insulin -dependent diabetes and prior pancreatitis presents with nausea vomiting abdominal pain was seen last week for the same diagnosed with pancreatitis but was able to be DC'd home.  P.o. pain medications did not do well and presents back again with increased pain Denies current use of alcohol.  Ultrasound showed no evidence of gallstones. Given admitted for pain control rehydration will probably need lipid panel done  Accepted tele can go either [facility] or [facility]  Please call flow manager on arrival [employer] 6:54 PM

## 2023-10-31 NOTE — ED Notes (Signed)
Attempt to call report.  No answer °

## 2023-10-31 NOTE — Hospital Course (Signed)
 Joshua Escobar is a 52 y.o. male with medical history significant for insulin -dependent T2DM, HTN, HLD, prior pancreatitis, history of alcohol use who is admitted with acute pancreatitis failing outpatient management.

## 2023-10-31 NOTE — ED Triage Notes (Signed)
 Pt via pov from home with continued abdominal pain; he was seen for the same on Friday; states he thinks it is worse today. He endorses nausea, denies emesis today, but did vomit last night. Pt alert & oriented, nad noted.

## 2023-10-31 NOTE — ED Provider Notes (Signed)
 [place] EMERGENCY DEPARTMENT AT [facility] Provider Note   CSN: [id number] Arrival date & time: 10/31/23  1506     History  Chief Complaint  Patient presents with   Nausea    a   Abdominal Pain    Joshua Escobar is a 53 y.o. male.  Patient hypertension, type II diabetes on insulin  only, hyperlipidemia, former smoker, reports previous episode of uncomplicated pancreatitis 09/2022 of unclear etiology ([place], [place], notes in CareEverywhere) --presents to the emergency department today for evaluation of ongoing abdominal pain with radiation to the back, nausea and vomiting.  Patient was seen in the emergency department on 10/26/2023.  Patient had elevated lipase and CT scan consistent with acute pancreatitis without complication at that time.  Symptoms reportedly improved in the emergency department enough to where he could go home.  He has been taking medications prescribed and symptoms have been poorly controlled prompting reevaluation today.  Patient denies fever, chest pain or shortness of breath.  Pain radiates from the upper abdomen to the back.  No dysuria reported.  Patient reports infrequent alcohol use.  Denies heavy NSAID use.  Denies history of gallstones or abdominal surgeries.  Denies history of high cholesterol or high triglycerides that he is aware of.  Normal endoscopy 10/2020.       Home Medications Prior to Admission medications   Medication Sig Start Date End Date Taking? Authorizing Provider  cholestyramine  (QUESTRAN ) 4 g packet Take 1 packet (4 g total) by mouth at bedtime. 07/03/23   Austine Lefort, MD  Continuous Glucose Sensor (FREESTYLE LIBRE 3 PLUS SENSOR) MISC Change sensor every 15 days. 10/23/23   Austine Lefort, MD  insulin  glargine (LANTUS ) 100 UNIT/ML Solostar Pen Inject 30 Units into the skin 2 (two) times daily. Appointment needed. 09/13/23   Austine Lefort, MD  insulin  lispro (HUMALOG  KWIKPEN) 100 UNIT/ML KwikPen Inject 8 Units into the  skin 3 (three) times daily. 07/03/23   Austine Lefort, MD  meclizine  (ANTIVERT ) 25 MG tablet Take 1 tablet (25 mg total) by mouth 3 (three) times daily as needed for dizziness (vertigo). 10/05/23   Austine Lefort, MD  ondansetron  (ZOFRAN ) 4 MG tablet Take 1 tablet (4 mg total) by mouth every 6 (six) hours as needed for up to 30 doses for nausea or vomiting. 10/27/23   Onetha Bile, MD  oxyCODONE (ROXICODONE) 5 MG immediate release tablet Take 1 tablet (5 mg total) by mouth every 6 (six) hours as needed for severe pain (pain score 7-10). 10/27/23   Onetha Bile, MD  sildenafil  (VIAGRA ) 100 MG tablet Take 0.5-1 tablets (50-100 mg total) by mouth daily as needed for erectile dysfunction. 07/03/23   Austine Lefort, MD      Allergies    Patient has no known allergies.    Review of Systems   Review of Systems  Physical Exam Updated Vital Signs BP 122/79 (BP Location: Left Arm)   Pulse 94   Temp 98.1 F (36.7 C)   Resp 16   Ht 5\' 8"  (1.727 m)   Wt 73.9 kg   SpO2 100%   BMI 24.77 kg/m   Physical Exam Vitals and nursing note reviewed.  Constitutional:      General: He is in acute distress.     Appearance: He is well-developed.  HENT:     Head: Normocephalic and atraumatic.     Mouth/Throat:     Mouth: Mucous membranes are dry.  Eyes:  General:        Right eye: No discharge.        Left eye: No discharge.     Conjunctiva/sclera: Conjunctivae normal.  Cardiovascular:     Rate and Rhythm: Normal rate and regular rhythm.     Heart sounds: Normal heart sounds.  Pulmonary:     Effort: Pulmonary effort is normal.     Breath sounds: Normal breath sounds.  Abdominal:     Palpations: Abdomen is soft.     Tenderness: There is abdominal tenderness. There is no guarding or rebound.  Musculoskeletal:     Cervical back: Normal range of motion and neck supple.  Skin:    General: Skin is warm and dry.  Neurological:     General: No focal deficit present.     Mental Status:  He is alert.     ED Results / Procedures / Treatments   Labs (all labs ordered are listed, but only abnormal results are displayed) Labs Reviewed  LIPASE, BLOOD - Abnormal; Notable for the following components:      Result Value   Lipase 900 (*)    All other components within normal limits  COMPREHENSIVE METABOLIC PANEL WITH GFR - Abnormal; Notable for the following components:   Chloride 96 (*)    Glucose, Bld 360 (*)    Alkaline Phosphatase 175 (*)    All other components within normal limits  CBC - Abnormal; Notable for the following components:   MCV 79.8 (*)    MCH 25.2 (*)    All other components within normal limits  URINALYSIS, ROUTINE W REFLEX MICROSCOPIC  TRIGLYCERIDES    EKG None  Radiology US  Abdomen Limited RUQ (LIVER/GB) Result Date: 10/31/2023 CLINICAL DATA:  Pancreatitis. Continued abdominal pain with nausea and vomiting. EXAM: ULTRASOUND ABDOMEN LIMITED RIGHT UPPER QUADRANT COMPARISON:  CT abdomen pelvis 10/26/2023. FINDINGS: Gallbladder: No gallstones or wall thickening visualized. No sonographic Murphy sign noted by sonographer. Common bile duct: Diameter: 5 mm, within normal limits. No intrahepatic biliary ductal dilatation. Liver: No focal lesion identified. Within normal limits in parenchymal echogenicity. Portal vein is patent on color Doppler imaging with normal direction of blood flow towards the liver. Other: None. IMPRESSION: No acute findings. Electronically Signed   By: Shearon Denis M.D.   On: 10/31/2023 16:50    Procedures Procedures    Medications Ordered in ED Medications  0.9 %  sodium chloride  infusion ( Intravenous New Bag/Given 10/31/23 1746)  ondansetron  (ZOFRAN ) injection 4 mg (4 mg Intravenous Given 10/31/23 1607)  HYDROmorphone  (DILAUDID ) injection 1 mg (1 mg Intravenous Given 10/31/23 1608)  sodium chloride  0.9 % bolus 1,000 mL (1,000 mLs Intravenous New Bag/Given 10/31/23 1609)  HYDROmorphone  (DILAUDID ) injection 0.5 mg (0.5 mg  Intravenous Given 10/31/23 1743)  pantoprazole  (PROTONIX ) injection 40 mg (40 mg Intravenous Given 10/31/23 1744)    ED Course/ Medical Decision Making/ A&P    Patient seen and examined. History obtained directly from patient.  Reviewed workup including CT images from previous ED visit, reviewed hospitalization from May 20[facility] in [facility] .  Labs/EKG: Ordered CBC, CMP, lipase, UA.  EKG added.  Imaging: Right upper quadrant ultrasound to evaluate for possibility of gallstones.  Medications/Fluids: Ordered: IV Dilaudid , IV Zofran , IV fluid bolus  Most recent vital signs reviewed and are as follows: BP 122/79 (BP Location: Left Arm)   Pulse 94   Temp 98.1 F (36.7 C)   Resp 16   Ht 5\' 8"  (1.727 m)   Wt 73.9 kg  SpO2 100%   BMI 24.77 kg/m   Initial impression: Acute pancreatitis in setting of insulin -dependent diabetes, would likely need admission for symptom control given that he has failed home pain medication and antiemetics.  Will need to evaluate and rule out DKA as a potential etiology of symptoms.  5:20 PM Reassessment performed. Patient appears more comfortable now.  Watching TV.  Labs personally reviewed and interpreted including: CBC normal with normal WBC; CMP glucose 360 but bicarb is normal at 25, anion gap is normal at 14, normal potassium; lipase remains elevated today at 900.  Triglycerides are pending.  Imaging personally visualized and interpreted including: Right upper quadrant ultrasound, negative for gallstones.  Reviewed pertinent lab work and imaging with patient at bedside. Questions answered.   Most current vital signs reviewed and are as follows: BP (!) 164/92   Pulse 85   Temp 98.1 F (36.7 C)   Resp 16   Ht 5\' 8"  (1.727 m)   Wt 73.9 kg   SpO2 99%   BMI 24.77 kg/m   Plan: Patient would like additional dose of pain medication, 0.5 mg hydromorphone  ordered.  Plan for admission to the hospital for pancreatitis, not controlled with outpatient  pain medication and antiemetics.  Uncertain etiology.  6:54 PM Reassessment performed. Patient appears stable, comfortable.  Reviewed pertinent lab work and imaging with patient at bedside. Questions answered.   Most current vital signs reviewed and are as follows: BP 111/70   Pulse 75   Temp 98.1 F (36.7 C)   Resp 16   Ht 5\' 8"  (1.727 m)   Wt 73.9 kg   SpO2 100%   BMI 24.77 kg/m   Plan: Admit to hospital.   Patient discussed with Dr. Hendrick Locke with [facility].                                Medical Decision Making Amount and/or Complexity of Data Reviewed Labs: ordered. Radiology: ordered.  Risk Prescription drug management.   For this patient's complaint of abdominal pain, the following conditions were considered on the differential diagnosis: gastritis/PUD, enteritis/duodenitis, appendicitis, cholelithiasis/cholecystitis, cholangitis, pancreatitis, ruptured viscus, colitis, diverticulitis, small/large bowel obstruction, proctitis, cystitis, pyelonephritis, ureteral colic, aortic dissection, aortic aneurysm. Atypical chest etiologies were also considered including ACS, PE, and pneumonia.  Lab workup is consistent with continued acute pancreatitis.  Patient is having difficulty with pain control and oral intake with persistent nausea and vomiting.  At this point, he has failed outpatient management and I advised admission to the hospital.  Patient agrees.         Final Clinical Impression(s) / ED Diagnoses Final diagnoses:  Acute pancreatitis without infection or necrosis, unspecified pancreatitis type    Rx / DC Orders ED Discharge Orders     None         Lyna Sandhoff, PA-C 10/31/23 1855    Ninetta Basket, MD 11/02/23 [phone]

## 2023-10-31 NOTE — H&P (Signed)
 History and Physical    Joshua Escobar WGN:[id number] DOB: 09/07/1970 DOA: 10/31/2023  PCP: Austine Lefort, MD  Patient coming from: Home  I have personally briefly reviewed patient's old medical records in [facility]  Chief Complaint: Abdominal pain  HPI: Joshua Escobar is a 53 y.o. male with medical history significant for insulin -dependent T2DM, HTN, HLD, prior pancreatitis who presented to the ED for evaluation of abdominal pain.  Patient initially presented to the ED on 10/26/2023 with upper abdominal pain associated with nausea/vomiting.  Workup was notable for lipase 806 with normal LFTs.  CT A/P with contrast showed peripancreatic edema consistent with acute pancreatitis.  No loculated collection or pancreatic necrosis noted.  Patient was given IV fluids, Dilaudid , antiemetics.  Patient clinically improved and was tolerating oral intake.  He was discharged home.  Patient states he was initially feeling well however developed recurrent symptoms about 2 days after he was seen.  He has been having epigastric abdominal pain radiating to his back.  He has had associated nausea and vomiting.  Patient states that he ate breakfast this morning however about 1 hour later developed recurrent worsening abdominal pain.  He denies any recent changes in his medications.  He states he will drink alcohol on occasion, about every 2-3 weeks but denies any recent use or binge drinking prior to his recent episode.  Patient was previously admitted for pancreatitis of unclear etiology in May 2024.  [facility] ED Course  Labs/Imaging on admission: I have personally reviewed following labs and imaging studies.  Initial vitals showed BP 122/79, pulse 94, RR 16, temp 98.1 F, SpO2 100% on room air.  Labs showed WBC 6.1, hemoglobin 14.6, platelets 315, sodium 135, potassium 4.5, bicarb 25, BUN 14, creatinine 1.07, serum glucose 360, AST 21, ALT 44, alk phos 175, total bilirubin  0.4.  Lipase 900.  Triglycerides in process.  UA negative for UTI.  RUQ ultrasound negative for acute findings.  No gallstones or gallbladder wall thickening visualized.  CBD 5 mm within normal limits.  Patient was given 1 L normal saline, IV Dilaudid , Zofran .  The hospitalist service was consulted to admit.  Review of Systems: All systems reviewed and are negative except as documented in history of present illness above.   Past Medical History:  Diagnosis Date   MI (myocardial infarction) (HCC) 10/2019   Migraine headache    Type II diabetes mellitus (HCC)     Past Surgical History:  Procedure Laterality Date   BIOPSY  11/16/2020   Procedure: BIOPSY;  Surgeon: Urban Garden, MD;  [facility]: [facility];  Service: Gastroenterology;;   COLONOSCOPY WITH PROPOFOL  N/A 09/11/2020   Procedure: COLONOSCOPY WITH PROPOFOL ;  Surgeon: Urban Garden, MD;  Location: [facility];  Service: Gastroenterology;  Laterality: N/A;   ESOPHAGOGASTRODUODENOSCOPY (EGD) WITH PROPOFOL  N/A 11/16/2020   Procedure: ESOPHAGOGASTRODUODENOSCOPY (EGD) WITH PROPOFOL ;  Surgeon: Urban Garden, MD;  Location: [facility];  Service: Gastroenterology;  Laterality: N/A;  12:10   LEFT HEART CATH AND CORONARY ANGIOGRAPHY N/A 11/27/2016   Procedure: Left Heart Cath and Coronary Angiography;  Surgeon: Avanell Leigh, MD;  Location: [facility] INVASIVE CV LAB;  Service: Cardiovascular;  Laterality: N/A;    Social History: Patient states that he quit smoking in May 2024.  Patient reports occasional alcohol use about every 2-3 weeks, denies binge drinking.  No Known Allergies  Family History  Problem Relation Age of Onset   Diabetes Mellitus II Sister  Diabetes Sister    Diabetes Mellitus II Brother    Diabetes Brother    Diabetes Mellitus II Mother    Diabetes Mother    Diabetes Mellitus II Father    Diabetes Father    Heart disease Father      Prior to Admission medications    Medication Sig Start Date End Date Taking? Authorizing Provider  cholestyramine  (QUESTRAN ) 4 g packet Take 1 packet (4 g total) by mouth at bedtime. 07/03/23   Austine Lefort, MD  Continuous Glucose Sensor (FREESTYLE LIBRE 3 PLUS SENSOR) MISC Change sensor every 15 days. 10/23/23   Austine Lefort, MD  insulin  glargine (LANTUS ) 100 UNIT/ML Solostar Pen Inject 30 Units into the skin 2 (two) times daily. Appointment needed. 09/13/23   Austine Lefort, MD  insulin  lispro (HUMALOG  KWIKPEN) 100 UNIT/ML KwikPen Inject 8 Units into the skin 3 (three) times daily. 07/03/23   Austine Lefort, MD  meclizine  (ANTIVERT ) 25 MG tablet Take 1 tablet (25 mg total) by mouth 3 (three) times daily as needed for dizziness (vertigo). 10/05/23   Austine Lefort, MD  ondansetron  (ZOFRAN ) 4 MG tablet Take 1 tablet (4 mg total) by mouth every 6 (six) hours as needed for up to 30 doses for nausea or vomiting. 10/27/23   Onetha Bile, MD  oxyCODONE (ROXICODONE) 5 MG immediate release tablet Take 1 tablet (5 mg total) by mouth every 6 (six) hours as needed for severe pain (pain score 7-10). 10/27/23   Onetha Bile, MD  sildenafil  (VIAGRA ) 100 MG tablet Take 0.5-1 tablets (50-100 mg total) by mouth daily as needed for erectile dysfunction. 07/03/23   Austine Lefort, MD    Physical Exam: Vitals:   10/31/23 2000 10/31/23 2103 10/31/23 2104 10/31/23 2146  BP: 121/88  (!) 129/91 109/74  Pulse: 71  79 77  Resp: 18  13 18   Temp:  97.9 F (36.6 C)  97.6 F (36.4 C)  TempSrc:  Oral  Oral  SpO2: 100%  100% 100%  Weight:      Height:       Constitutional: Resting in bed, NAD, calm, comfortable Eyes: EOMI, lids and conjunctivae normal ENMT: Mucous membranes are moist. Posterior pharynx clear of any exudate or lesions.Normal dentition.  Neck: normal, supple, no masses. Respiratory: clear to auscultation bilaterally, no wheezing, no crackles. Normal respiratory effort. No accessory muscle use.  Cardiovascular:  Regular rate and rhythm, no murmurs / rubs / gallops. No extremity edema. 2+ pedal pulses. Abdomen: Epigastric tenderness, no masses palpated. Musculoskeletal: no clubbing / cyanosis. No joint deformity upper and lower extremities. Good ROM, no contractures. Normal muscle tone.  Skin: no rashes, lesions, ulcers. No induration Neurologic: Sensation intact. Strength 5/5 in all 4.  Psychiatric: Normal judgment and insight. Alert and oriented x 3. Normal mood.   EKG: Not performed. Assessment/Plan Principal Problem:   Acute pancreatitis without necrosis or infection, unspecified Active Problems:   Type 2 diabetes mellitus with hyperglycemia, with long-term current use of insulin  (HCC)   Joshua Escobar is a 53 y.o. male with medical history significant for insulin -dependent T2DM, HTN, HLD, prior pancreatitis, history of alcohol use who is admitted with acute pancreatitis failing outpatient management.  Assessment and Plan: Acute pancreatitis: Initially seen in the ED 5/30 with acute pancreatitis, now admitted after failing outpatient management.  RUQ ultrasound negative for gallstones, cholecystitis, or CBD dilation.  Patient denies any recent alcohol use.  Triglycerides 102. - Keep n.p.o. tonight - Continue IV fluid  hydration overnight - Continue antiemetics and analgesics as needed  Insulin -dependent type 2 diabetes with hyperglycemia: Placed on SSI-moderate q4h while NPO.   DVT prophylaxis: enoxaparin  (LOVENOX ) injection 40 mg Start: 10/31/23 2330 Code Status: Full code, confirmed with patient on admission Family Communication: With patient, he has discussed with family Disposition Plan: From home and likely discharge to home pending clinical progress Consults called: None Severity of Illness: The appropriate patient status for this patient is INPATIENT. Inpatient status is judged to be reasonable and necessary in order to provide the required intensity of service to ensure the  patient's safety. The patient's presenting symptoms, physical exam findings, and initial radiographic and laboratory data in the context of their chronic comorbidities is felt to place them at high risk for further clinical deterioration. Furthermore, it is not anticipated that the patient will be medically stable for discharge from the hospital within 2 midnights of admission.   * I certify that at the point of admission it is my clinical judgment that the patient will require inpatient hospital care spanning beyond 2 midnights from the point of admission due to high intensity of service, high risk for further deterioration and high frequency of surveillance required.Edith Gores MD [facility]  If 7PM-7AM, please contact night-coverage [email or web address]  10/31/2023, 10:38 PM

## 2023-11-01 ENCOUNTER — Encounter (HOSPITAL_COMMUNITY): Payer: Self-pay | Admitting: Internal Medicine

## 2023-11-01 DIAGNOSIS — K859 Acute pancreatitis without necrosis or infection, unspecified: Secondary | ICD-10-CM | POA: Diagnosis not present

## 2023-11-01 LAB — CBC
HCT: 39.9 % (ref 39.0–52.0)
Hemoglobin: 12.6 g/dL — ABNORMAL LOW (ref 13.0–17.0)
MCH: 25.7 pg — ABNORMAL LOW (ref 26.0–34.0)
MCHC: 31.6 g/dL (ref 30.0–36.0)
MCV: 81.4 fL (ref 80.0–100.0)
Platelets: 266 10*3/uL (ref 150–400)
RBC: 4.9 MIL/uL (ref 4.22–5.81)
RDW: 13.9 % (ref 11.5–15.5)
WBC: 5.3 10*3/uL (ref 4.0–10.5)
nRBC: 0 % (ref 0.0–0.2)

## 2023-11-01 LAB — COMPREHENSIVE METABOLIC PANEL WITH GFR
ALT: 28 U/L (ref 0–44)
AST: 14 U/L — ABNORMAL LOW (ref 15–41)
Albumin: 2.8 g/dL — ABNORMAL LOW (ref 3.5–5.0)
Alkaline Phosphatase: 96 U/L (ref 38–126)
Anion gap: 6 (ref 5–15)
BUN: 11 mg/dL (ref 6–20)
CO2: 28 mmol/L (ref 22–32)
Calcium: 8.5 mg/dL — ABNORMAL LOW (ref 8.9–10.3)
Chloride: 107 mmol/L (ref 98–111)
Creatinine, Ser: 0.94 mg/dL (ref 0.61–1.24)
GFR, Estimated: >60 mL/min (ref >60)
Glucose, Bld: 111 mg/dL — ABNORMAL HIGH (ref 70–99)
Potassium: 3.8 mmol/L (ref 3.5–5.1)
Sodium: 141 mmol/L (ref 135–145)
Total Bilirubin: 0.4 mg/dL (ref 0.0–1.2)
Total Protein: 5.9 g/dL — ABNORMAL LOW (ref 6.5–8.1)

## 2023-11-01 LAB — GLUCOSE, CAPILLARY
Glucose-Capillary: 105 mg/dL — ABNORMAL HIGH (ref 70–99)
Glucose-Capillary: 126 mg/dL — ABNORMAL HIGH (ref 70–99)
Glucose-Capillary: 225 mg/dL — ABNORMAL HIGH (ref 70–99)
Glucose-Capillary: 272 mg/dL — ABNORMAL HIGH (ref 70–99)
Glucose-Capillary: 74 mg/dL (ref 70–99)
Glucose-Capillary: 88 mg/dL (ref 70–99)

## 2023-11-01 LAB — TROPONIN I (HIGH SENSITIVITY)
Troponin I (High Sensitivity): <2 ng/L (ref <18)
Troponin I (High Sensitivity): <2 ng/L (ref <18)

## 2023-11-01 MED ORDER — LIVING WELL WITH DIABETES BOOK
Freq: Once | Status: DC
  Filled 2023-11-01: qty 1

## 2023-11-01 MED ORDER — INSULIN ASPART 100 UNIT/ML IJ SOLN
0.0000 [IU] | Freq: Every day | INTRAMUSCULAR | Status: DC
  Administered 2023-11-01: 3 [IU] via SUBCUTANEOUS

## 2023-11-01 MED ORDER — INSULIN ASPART 100 UNIT/ML IJ SOLN: 0.0000 [IU] | Freq: Three times a day (TID) | INTRAMUSCULAR | Status: DC

## 2023-11-01 MED ORDER — INSULIN ASPART 100 UNIT/ML IJ SOLN
0.0000 [IU] | Freq: Three times a day (TID) | INTRAMUSCULAR | Status: DC
  Administered 2023-11-02 (×2): 3 [IU] via SUBCUTANEOUS

## 2023-11-01 NOTE — Inpatient Diabetes Management (Signed)
 Inpatient Diabetes Program Recommendations  AACE/ADA: New Consensus Statement on Inpatient Glycemic Control (2015)  Target Ranges:  Prepandial:   less than 140 mg/dL      Peak postprandial:   less than 180 mg/dL (1-2 hours)      Critically ill patients:  140 - 180 mg/dL   Lab Results  Component Value Date   GLUCAP 88 11/01/2023   HGBA1C 11.9 (H) 10/12/2023    Latest Reference Range & Units 11/01/23 00:08 11/01/23 04:01 11/01/23 06:03 11/01/23 07:57 11/01/23 11:50  Glucose-Capillary 70 - 99 mg/dL 161 (H) 74 096 (H) 045 (H) 88  (H): Data is abnormally high  Diabetes history: DM2 Outpatient Diabetes medications: Lantus  20 bid, Humalog  8 tid  Current orders for Inpatient glycemic control: Novolog  0-15 units tid, 0-5 units hs  Inpatient Diabetes Program Recommendations:   Patient currently on clear liquids only. Please consider: -Add Lantus  8 units daily -Decrease Novolog  correction to 0-9 units tid, 0-5 hs  Spoke with patient regarding diabetes management. Patient was aware of A1c 11.9 (average blood glucose 295 over the past 2-3 months). Patient shared that his PCP Dr. Aquilla Knapp has referred him to see an endocrinologist. Patient states he only drinks water  and non sugary drinks.  Thank you, Louanna Vanliew E. Vergene Marland, RN, MSN, CDCES  Diabetes Coordinator Inpatient Glycemic Control Team Team Pager [phone] (8am-5pm) 11/01/2023 1:14 PM

## 2023-11-01 NOTE — Progress Notes (Signed)
 Joshua Escobar  ZOX:[id number] DOB: February 18, 1971 DOA: 10/31/2023 PCP: Joshua Lefort, MD    Brief Narrative:  53 year old with a history of DM2, HTN, HLD, and a prior episode of pancreatitis May 2024 who presented to the ER 6/4 with abdominal pain with nausea and vomiting.  He was seen in the ER 10/26/2023 for the same complaints at which time a lipase was 806 and LFTs were noted to be normal.  CT A/P noted.  Pancreatic edema suggestive of acute pancreatitis with no evidence of other complications.  He was sent home after that initial ER visit, but return to the ER 10/31/2023 with recurrence of his epigastric pain radiating through to his back with nausea and vomiting.  He reported only occasional alcohol intake.  RUQ US  at the time of his 2nd presentation was without acute findings confirming a normal common bile duct and no gallstones.  Goals of Care:   Code Status: Full Code   DVT prophylaxis: enoxaparin  (LOVENOX ) injection 40 mg Start: 10/31/23 2330   Interim Hx: Afebrile since admission.  Vital signs stable.  CBG controlled.  Resting comfortably at the time of my visit.  Reports that his abdominal pain is much improved.  He is tolerating clear liquid diet.  He did experience an episode of lower chest pain this morning.  An EKG was accomplished which the computer read as STEMI but comparison to his baseline EKG suggested the findings in his septal/anterior leads were actually baseline.  Additionally serial troponin was entirely normal.  At the time of my follow-up visit he has had no further chest pain and appears quite comfortable.  Assessment & Plan:  Acute idiopathic pancreatitis No evidence of gallstones or choledocholithiasis -denies history of significant alcohol intake -triglycerides are normal -continue supportive care -slowly advance diet  Chest pain Likely simply referred pain from epigastrium - EKG consistent with his baseline and troponin entirely normal  DM2 with  hyperglycemia A1c 11.9 10/12/2023 -follow CBGs   Family Communication: No family present at time of exam Disposition: Anticipate discharge home hopefully in 24-48 hours   Objective: Blood pressure 121/79, pulse 66, temperature 98.5 F (36.9 C), temperature source Oral, resp. rate 18, height 5\' 8"  (1.727 m), weight 73.9 kg, SpO2 100%.  Intake/Output Summary (Last 24 hours) at 11/01/2023 0950 Last data filed at 11/01/2023 0500 Gross per 24 hour  Intake 1925.46 ml  Output --  Net 1925.46 ml   Filed Weights   10/31/23 1517  Weight: 73.9 kg    Examination: General: No acute respiratory distress Lungs: Clear to auscultation bilaterally without wheezes or crackles Cardiovascular: Regular rate and rhythm without murmur gallop or rub normal S1 and S2 Abdomen: Mildly tender in epigastrium but not severely so, no rebound, no mass Extremities: No significant cyanosis, clubbing, or edema bilateral lower extremities  CBC: Recent Labs  Lab 10/26/23 1837 10/31/23 1558 11/01/23 0646  WBC 6.7 6.1 5.3  HGB 13.8 14.6 12.6*  HCT 44.2 46.3 39.9  MCV 81.1 79.8* 81.4  PLT 251 315 266   Basic Metabolic Panel: Recent Labs  Lab 10/26/23 1837 10/31/23 1558 11/01/23 0646  NA 136 135 141  K 3.9 4.5 3.8  CL 100 96* 107  CO2 22 25 28   GLUCOSE 177* 360* 111*  BUN 23* 14 11  CREATININE 0.97 1.07 0.94  CALCIUM  9.7 9.9 8.5*   GFR: Estimated Creatinine Clearance: 89.9 mL/min (by C-G formula based on SCr of 0.94 mg/dL).   Scheduled Meds:  enoxaparin  (LOVENOX ) injection  40 mg Subcutaneous Q24H   insulin  aspart  0-15 Units Subcutaneous Q4H   sodium chloride  flush  3 mL Intravenous Q12H     LOS: 1 day   [name], MD [facility] Office  [phone] Pager - Text Page per [name]  If 7PM-7AM, please contact night-coverage per Amion 11/01/2023, 9:50 AM

## 2023-11-01 NOTE — Plan of Care (Signed)

## 2023-11-02 DIAGNOSIS — K859 Acute pancreatitis without necrosis or infection, unspecified: Secondary | ICD-10-CM | POA: Diagnosis not present

## 2023-11-02 LAB — COMPREHENSIVE METABOLIC PANEL WITH GFR
ALT: 24 U/L (ref 0–44)
AST: 14 U/L — ABNORMAL LOW (ref 15–41)
Albumin: 3 g/dL — ABNORMAL LOW (ref 3.5–5.0)
Alkaline Phosphatase: 89 U/L (ref 38–126)
Anion gap: 7 (ref 5–15)
BUN: 7 mg/dL (ref 6–20)
CO2: 27 mmol/L (ref 22–32)
Calcium: 9 mg/dL (ref 8.9–10.3)
Chloride: 104 mmol/L (ref 98–111)
Creatinine, Ser: 0.86 mg/dL (ref 0.61–1.24)
GFR, Estimated: >60 mL/min (ref >60)
Glucose, Bld: 124 mg/dL — ABNORMAL HIGH (ref 70–99)
Potassium: 3.8 mmol/L (ref 3.5–5.1)
Sodium: 138 mmol/L (ref 135–145)
Total Bilirubin: 0.5 mg/dL (ref 0.0–1.2)
Total Protein: 5.9 g/dL — ABNORMAL LOW (ref 6.5–8.1)

## 2023-11-02 LAB — CBC
HCT: 40 % (ref 39.0–52.0)
Hemoglobin: 12.4 g/dL — ABNORMAL LOW (ref 13.0–17.0)
MCH: 25.4 pg — ABNORMAL LOW (ref 26.0–34.0)
MCHC: 31 g/dL (ref 30.0–36.0)
MCV: 81.8 fL (ref 80.0–100.0)
Platelets: 278 10*3/uL (ref 150–400)
RBC: 4.89 MIL/uL (ref 4.22–5.81)
RDW: 13.8 % (ref 11.5–15.5)
WBC: 4.9 10*3/uL (ref 4.0–10.5)
nRBC: 0 % (ref 0.0–0.2)

## 2023-11-02 LAB — PHOSPHORUS: Phosphorus: 3.7 mg/dL (ref 2.5–4.6)

## 2023-11-02 LAB — GLUCOSE, CAPILLARY
Glucose-Capillary: 187 mg/dL — ABNORMAL HIGH (ref 70–99)
Glucose-Capillary: 157 mg/dL — ABNORMAL HIGH (ref 70–99)

## 2023-11-02 LAB — MAGNESIUM: Magnesium: 1.7 mg/dL (ref 1.7–2.4)

## 2023-11-02 MED ORDER — ACETAMINOPHEN 325 MG PO TABS: 650.0000 mg | ORAL_TABLET | Freq: Four times a day (QID) | ORAL | Status: DC | PRN

## 2023-11-02 NOTE — Plan of Care (Signed)
  Problem: Clinical Measurements: Goal: Cardiovascular complication will be avoided Outcome: Progressing   Problem: Nutrition: Goal: Adequate nutrition will be maintained Outcome: Progressing   Problem: Elimination: Goal: Will not experience complications related to bowel motility Outcome: Progressing   Problem: Safety: Goal: Ability to remain free from injury will improve Outcome: Progressing

## 2023-11-02 NOTE — Discharge Summary (Signed)
 DISCHARGE SUMMARY  Joshua Escobar  MR#: [id number]  DOB:11/04/1970  Date of Admission: 10/31/2023 Date of Discharge: 11/02/2023  Attending Physician:Kenitra Leventhal Constantine Delude, MD  Patient's WUJ:[id number], Cisco Crest, MD  Disposition: D/C home   Follow-up Appts:  Follow-up Information     Austine Lefort, MD. Schedule an appointment as soon as possible for a visit in 7 day(s).   Specialty: Family Medicine Contact information: [address] [place] [address] [phone]                 Discharge Diagnoses: Acute idiopathic pancreatitis Chest pain - atypical  Uncontrolled DM2 with hyperglycemia   Initial presentation: 53 year old with a history of DM2, HTN, HLD, and a prior episode of pancreatitis May 2024 who presented to the ER 6/4 with abdominal pain with nausea and vomiting. He was seen in the ER 10/26/2023 for the same complaints at which time a lipase was 806 and LFTs were noted to be normal. CT A/P noted pancreatic edema suggestive of acute pancreatitis with no evidence of other complications. He was sent home after that initial ER visit, but returned to the ER 10/31/2023 with recurrence of his epigastric pain radiating through to his back with nausea and vomiting. He reported only occasional alcohol intake. RUQ US  at the time of his 2nd presentation was without acute findings confirming a normal common bile duct and no gallstones.   Hospital Course:  Acute idiopathic pancreatitis No evidence of gallstones or choledocholithiasis -denies history of significant alcohol intake -triglycerides are normal - slowly advanced diet without difficulty - pain well controlled at time of d/c w/ patient able to eat w/o difficulty - advised to avoid alcohol entirely, and to avoid high fat foods/heavy meals for the next 3-5 days    Chest pain Likely simply referred pain from epigastrium - EKG consistent with his baseline and troponin entirely normal - pain resolved spontaneously     DM2 with hyperglycemia A1c 11.9 10/12/2023 -counseled by DM educator during this hospital stay -advised to comply strictly with his home treatment regimen -has reportedly been referred to endocrinology for further evaluation -encouraged patient to keep outpatient appointment  Allergies as of 11/02/2023   No Known Allergies      Medication List     TAKE these medications    acetaminophen  325 MG tablet Commonly known as: TYLENOL  Take 2 tablets (650 mg total) by mouth every 6 (six) hours as needed for mild pain (pain score 1-3) or fever (or Fever >/= 101).   cholestyramine  4 g packet Commonly known as: Questran  Take 1 packet (4 g total) by mouth at bedtime.   ibuprofen 200 MG tablet Commonly known as: ADVIL Take 200-800 mg by mouth every 6 (six) hours as needed for moderate pain (pain score 4-6).   insulin  glargine 100 UNIT/ML Solostar Pen Commonly known as: LANTUS  Inject 30 Units into the skin 2 (two) times daily. Appointment needed. What changed: how much to take   insulin  lispro 100 UNIT/ML KwikPen Commonly known as: HumaLOG  KwikPen Inject 8 Units into the skin 3 (three) times daily.   meclizine  25 MG tablet Commonly known as: ANTIVERT  Take 1 tablet (25 mg total) by mouth 3 (three) times daily as needed for dizziness (vertigo).   ondansetron  4 MG tablet Commonly known as: ZOFRAN  Take 1 tablet (4 mg total) by mouth every 6 (six) hours as needed for up to 30 doses for nausea or vomiting.   oxyCODONE 5 MG immediate release tablet  Commonly known as: Roxicodone Take 1 tablet (5 mg total) by mouth every 6 (six) hours as needed for severe pain (pain score 7-10).   VITAMIN D PO Take 1 tablet by mouth daily.        Day of Discharge BP 121/79 (BP Location: Left Arm)   Pulse 62   Temp 98 F (36.7 C) (Oral)   Resp 16   Ht 5\' 8"  (1.727 m)   Wt 73.9 kg   SpO2 99%   BMI 24.77 kg/m   Physical Exam: General: No acute respiratory distress Lungs: Clear to  auscultation bilaterally without wheezes or crackles Cardiovascular: Regular rate and rhythm without murmur gallop or rub normal S1 and S2 Abdomen: Nontender, nondistended, soft, bowel sounds positive, no rebound, no ascites, no appreciable mass Extremities: No significant cyanosis, clubbing, or edema bilateral lower extremities  Basic Metabolic Panel: Recent Labs  Lab 10/26/23 1837 10/31/23 1558 11/01/23 0646 11/02/23 0628  NA 136 135 141 138  K 3.9 4.5 3.8 3.8  CL 100 96* 107 104  CO2 22 25 28 27   GLUCOSE 177* 360* 111* 124*  BUN 23* 14 11 7   CREATININE 0.97 1.07 0.94 0.86  CALCIUM  9.7 9.9 8.5* 9.0  MG  --   --   --  1.7  PHOS  --   --   --  3.7    CBC: Recent Labs  Lab 10/26/23 1837 10/31/23 1558 11/01/23 0646 11/02/23 0628  WBC 6.7 6.1 5.3 4.9  HGB 13.8 14.6 12.6* 12.4*  HCT 44.2 46.3 39.9 40.0  MCV 81.1 79.8* 81.4 81.8  PLT 251 315 266 278    Time spent in discharge (includes decision making & examination of pt): 35 minutes  11/02/2023, 2:05 PM   [name], MD [facility] Office  [phone]

## 2023-11-02 NOTE — Progress Notes (Signed)
   11/02/23   To Whom it may concern,  Joshua Escobar was hospitalized at [facility] from 10/31/2023 through 11/02/2023.  He has been cleared to return to work on but not before 11/03/2023 with no medical restrictions.    Should you have further questions please feel free to contact our office.  Be advised that no medical information will be divulged without a signed HIPA release from the patient.    Sincerely,  [name], MD [facility]  [phone]

## 2023-11-02 NOTE — Progress Notes (Signed)
 AVS completed for discharge; given to nurse to review with patient.

## 2023-11-02 NOTE — Plan of Care (Signed)

## 2023-11-07 ENCOUNTER — Encounter: Payer: Self-pay | Admitting: Cardiology

## 2023-11-07 ENCOUNTER — Ambulatory Visit: Attending: Cardiology | Admitting: Cardiology

## 2023-11-07 VITALS — BP 100/66 | HR 92 | Resp 16 | Ht 68.0 in | Wt 159.0 lb

## 2023-11-07 DIAGNOSIS — K859 Acute pancreatitis without necrosis or infection, unspecified: Secondary | ICD-10-CM | POA: Diagnosis not present

## 2023-11-07 DIAGNOSIS — E1165 Type 2 diabetes mellitus with hyperglycemia: Secondary | ICD-10-CM | POA: Diagnosis not present

## 2023-11-07 DIAGNOSIS — R072 Precordial pain: Secondary | ICD-10-CM | POA: Insufficient documentation

## 2023-11-07 DIAGNOSIS — Z794 Long term (current) use of insulin: Secondary | ICD-10-CM | POA: Diagnosis not present

## 2023-11-07 MED ORDER — ASPIRIN 81 MG PO TBEC: 81.0000 mg | DELAYED_RELEASE_TABLET | Freq: Every day | ORAL | Status: DC

## 2023-11-07 MED ORDER — METOPROLOL TARTRATE 100 MG PO TABS: ORAL_TABLET | ORAL | 0 refills | Status: DC

## 2023-11-07 NOTE — Progress Notes (Signed)
 Cardiology Office Note:  .   Date:  11/07/2023  ID:  Joshua Escobar, DOB [birth year], MRN [id number] PCP: Austine Lefort, MD  [facility] Providers Cardiologist:  Fransico Ivy, MD PCP: Austine Lefort, MD  Chief Complaint  Patient presents with   Chest Pain   New Patient (Initial Visit)     Joshua Escobar is a 53 y.o. male with hypertension, hyperlipidemia, uncontrolled type 2 diabetes mellitus, recurrent pancreatitis, numbness chest pain  Discussed the use of AI scribe software for clinical note transcription with the patient, who gave verbal consent to proceed.  History of Present Illness Patient was hospitalized in 10/2023 [facility] with complaints of abdominal pain, nausea, and vomiting.  He was found to have acute pancreatitis, with no clear etiology.  He was managed medically with improvement of symptoms.  In hospitalization, he reported chest pain with complete resolution.  He tells me that the chest pain felt like his previous heart attack. Troponins were normal. On further questioning, he tells me he had an outside pain management [place] , but heart catheterization did not lead to any stenting.  Reviewing his chart, he had heart catheterization metoprolol in February.  In 2018 that showed normal coronaries.  His A1c was 11.9%.  He received counseling by diabetes educator, and referral to endocrinology.    Vitals:   11/07/23 1341  BP: 100/66  Pulse: 92  Resp: 16  SpO2: 95%      Review of Systems  Cardiovascular:  Negative for chest pain, dyspnea on exertion, leg swelling, palpitations and syncope.        Studies Reviewed: Aaron Aas        EKG 11/07/2023: Normal sinus rhythm Right atrial enlargement Rightward axis When compared with ECG of 01-Nov-2023 10:19, Non-specific change in ST segment in Lateral leads T wave inversion now evident in Inferior leads Nonspecific T wave abnormality, improved in Lateral leads    Independently  interpreted 10/2023: HbA1C 11.9% Hb 12.4 Cr 0.86 Trop HS <2   Physical Exam Vitals and nursing note reviewed.  Constitutional:      General: He is not in acute distress. Neck:     Vascular: No JVD.  Cardiovascular:     Rate and Rhythm: Normal rate and regular rhythm.     Heart sounds: Normal heart sounds. No murmur heard. Pulmonary:     Effort: Pulmonary effort is normal.     Breath sounds: Normal breath sounds. No wheezing or rales.  Musculoskeletal:     Right lower leg: No edema.     Left lower leg: No edema.      VISIT DIAGNOSES:   ICD-10-CM   1. Precordial pain  R07.2 EKG 12-Lead    Lipid panel    ECHOCARDIOGRAM COMPLETE    2. Acute pancreatitis without infection or necrosis, unspecified pancreatitis type  K85.90     3. Type 2 diabetes mellitus with hyperglycemia, with long-term current use of insulin  [facility])  E11.65    Z79.4        Joshua Escobar is a 53 y.o. male with hypertension, hyperlipidemia, uncontrolled type 2 diabetes mellitus, recurrent pancreatitis, numbness chest pain  Assessment & Plan Precordial pain: Patient reported previous myocardial infarction with catheterization showing no blockages. Current chest pain requires further evaluation. - Order coronary CT angiogram to assess coronary artery blockages. - Administer medication to reduce heart rate before CT scan. - Order echocardiogram to evaluate cardiac function.  Type 2 diabetes mellitus with hyperglycemia: Uncontrolled diabetes with  history of hospitalization. Currently on insulin  therapy. No recent follow-up with primary care provider. - Continue insulin  therapy. - Follow up with primary care provider for diabetes management.  Pancreatitis: Previous pancreatitis episode with no identified cause. Potential link to elevated triglycerides. - Order fasting lipid panel after 14-hour fast to assess cholesterol and triglyceride levels.     Meds ordered this encounter  Medications    aspirin  EC 81 MG tablet    Sig: Take 1 tablet (81 mg total) by mouth daily. Swallow whole.     F/u as needed  Signed, Cody Das, MD

## 2023-11-07 NOTE — Patient Instructions (Addendum)
 MEDICATION: START Aspirin  81 mg daily   Lab Work: Fasting lipid panel  If you have labs (blood work) drawn today and your tests are completely normal, you will receive your results only by: MyChart Message (if you have MyChart) OR A paper copy in the mail If you have any lab test that is abnormal or we need to change your treatment, we will call you to review the results.  Testing/Procedures: Coronary CTA  Your physician has requested that you have cardiac CT. Cardiac computed tomography (CT) is a painless test that uses an x-ray machine to take clear, detailed pictures of your heart. For further information please visit [email or web address] Please follow instruction sheet as given.   ECHO  Your physician has requested that you have an echocardiogram. Echocardiography is a painless test that uses sound waves to create images of your heart. It provides your doctor with information about the size and shape of your heart and how well your heart's chambers and valves are working. This procedure takes approximately one hour. There are no restrictions for this procedure. Please do NOT wear cologne, perfume, aftershave, or lotions (deodorant is allowed). Please arrive 15 minutes prior to your appointment time.  Please note: We ask at that you not bring children with you during ultrasound (echo/ vascular) testing. Due to room size and safety concerns, children are not allowed in the ultrasound rooms during exams. Our front office staff cannot provide observation of children in our lobby area while testing is being conducted. An adult accompanying a patient to their appointment will only be allowed in the ultrasound room at the discretion of the ultrasound technician under special circumstances. We apologize for any inconvenience.   Follow-Up: At [facility], you and your health needs are our priority.  As part of our continuing mission to provide you with exceptional heart care, our  providers are all part of one team.  This team includes your primary Cardiologist (physician) and Advanced Practice Providers or APPs (Physician Assistants and Nurse Practitioners) who all work together to provide you with the care you need, when you need it.  Your next appointment:   AS NEEDED  Provider:   Cody Das, MD       Your cardiac CT will be scheduled at one of the below locations:   [facility] [address] [place], [place] [address] [phone]  OR  [facility] [address] [place], [place] [address] [phone]  OR   [facility] [address] [place], [place] [address] [phone]  OR   [facility] [address] [place], [place] [address] [phone]  OR   [facility] [address]  [place], [place] [address] Opening September 24, 2023  If scheduled at [facility], please arrive at the [facility] and Children's Entrance (Entrance C2) of [facility] 30 minutes prior to test start time. You can use the FREE valet parking offered at entrance C (encouraged to control the heart rate for the test)  Proceed to the [facility] Radiology Department (first floor) to check-in and test prep.   All radiology patients and guests should use entrance C2 at [facility], accessed from [facility], even though the hospital's physical address listed is [address]    If scheduled at the Heart and Vascular Tower at [address], please enter the parking lot using the  [address] entrance and use the FREE valet service at the patient drop-off area. Enter the buidling and check-in with registration on the main floor.  If scheduled at [facility] or [facility], please arrive 15 mins early for check-in and test prep.  There is  spacious parking and easy access to the radiology department from the [facility] entrance. Please enter here and check-in with the desk attendant.   If scheduled at [facility], please arrive 30 minutes early for check-in and test prep.  Please follow these instructions carefully (unless otherwise directed):  An IV will be required for this test and Nitroglycerin  will be given.  Hold all erectile dysfunction medications at least 3 days (72 hrs) prior to test. (Ie viagra , cialis, sildenafil , tadalafil, etc)   On the Night Before the Test: Be sure to Drink plenty of water . Do not consume any caffeinated/decaffeinated beverages or chocolate 12 hours prior to your test. Do not take any antihistamines 12 hours prior to your test.   On the Day of the Test: Drink plenty of water  until 1 hour prior to the test. Do not eat any food 1 hour prior to test. You may take your regular medications prior to the test.  Take metoprolol 100 mg two hours prior to test. If you take Furosemide/Hydrochlorothiazide/Spironolactone/Chlorthalidone, please HOLD on the morning of the test. Patients who wear a continuous glucose monitor MUST remove the device.       After the Test: Drink plenty of water . After receiving IV contrast, you may experience a mild flushed feeling. This is normal. On occasion, you may experience a mild rash up to 24 hours after the test. This is not dangerous. If this occurs, you can take Benadryl  25 mg, Zyrtec, Claritin, or Allegra and increase your fluid intake. (Patients taking Tikosyn should avoid Benadryl , and may take Zyrtec, Claritin, or Allegra) If you experience trouble breathing, this can be serious. If it is severe call 911 IMMEDIATELY. If it is mild, please call our office.  We will call to schedule your test 2-4 weeks out understanding that some insurance companies will need an authorization prior to the service being performed.   For more information  and frequently asked questions, please visit our website : [email or web address]  For non-scheduling related questions, please contact the cardiac imaging nurse navigator should you have any questions/concerns: Cardiac Imaging Nurse Navigators Direct Office Dial : [phone]   For scheduling needs, including cancellations and rescheduling, please call [place], [phone].

## 2023-11-08 ENCOUNTER — Ambulatory Visit: Payer: Self-pay

## 2023-11-08 ENCOUNTER — Other Ambulatory Visit: Payer: Self-pay | Admitting: Cardiology

## 2023-11-08 ENCOUNTER — Other Ambulatory Visit: Payer: Self-pay | Admitting: Family Medicine

## 2023-11-08 DIAGNOSIS — E1165 Type 2 diabetes mellitus with hyperglycemia: Secondary | ICD-10-CM

## 2023-11-08 LAB — LIPID PANEL
Chol/HDL Ratio: 1.7 ratio (ref 0.0–5.0)
Cholesterol, Total: 139 mg/dL (ref 100–199)
HDL: 82 mg/dL (ref >39)
LDL Chol Calc (NIH): 43 mg/dL (ref 0–99)
Triglycerides: 68 mg/dL (ref 0–149)
VLDL Cholesterol Cal: 14 mg/dL (ref 5–40)

## 2023-11-09 ENCOUNTER — Ambulatory Visit: Admitting: Family Medicine

## 2023-11-09 ENCOUNTER — Telehealth: Payer: Self-pay | Admitting: *Deleted

## 2023-11-09 ENCOUNTER — Encounter: Payer: Self-pay | Admitting: Family Medicine

## 2023-11-09 ENCOUNTER — Other Ambulatory Visit: Payer: Self-pay | Admitting: Cardiology

## 2023-11-09 VITALS — BP 118/81 | HR 83 | Temp 98.3°F | Ht 68.0 in

## 2023-11-09 DIAGNOSIS — Z794 Long term (current) use of insulin: Secondary | ICD-10-CM | POA: Diagnosis not present

## 2023-11-09 DIAGNOSIS — E1165 Type 2 diabetes mellitus with hyperglycemia: Secondary | ICD-10-CM

## 2023-11-09 NOTE — Telephone Encounter (Signed)
 Requested medication (s) are due for refill today: yes   Requested medication (s) are on the active medication list: yes   Last refill:  09/13/23 #15 ml 1 refills  Future visit scheduled: no   Notes to clinic:  Last OV today . Do you want to continue refills ?     Requested Prescriptions  Pending Prescriptions Disp Refills   LANTUS  SOLOSTAR 100 UNIT/ML Solostar Pen [Pharmacy Med Name: LANTUS  SOLOSTAR PEN INJ 3ML] 15 mL 1    Sig: INJECT 30 UNITS INTO SKIN TWICE DAILY     Endocrinology:  Diabetes - Insulins Failed - 11/09/2023  2:13 PM      Failed - HBA1C is between 0 and 7.9 and within 180 days    HbA1c, POC (controlled diabetic range)  Date Value Ref Range Status  05/05/2020 12.8 (A) 0.0 - 7.0 % Final   Hgb A1c MFr Bld  Date Value Ref Range Status  10/12/2023 11.9 (H) <5.7 % Final    Comment:    For someone without known diabetes, a hemoglobin A1c value of 6.5% or greater indicates that they may have  diabetes and this should be confirmed with a follow-up  test. . For someone with known diabetes, a value <7% indicates  that their diabetes is well controlled and a value  greater than or equal to 7% indicates suboptimal  control. A1c targets should be individualized based on  duration of diabetes, age, comorbid conditions, and  other considerations. . Currently, no consensus exists regarding use of hemoglobin A1c for diagnosis of diabetes for children. .          Failed - Valid encounter within last 6 months    Recent Outpatient Visits           Today Type 2 diabetes mellitus with hyperglycemia, with long-term current use of insulin  [facility])   [facility] Austine Lefort, MD   4 weeks ago Type 2 diabetes mellitus with hyperglycemia, with long-term current use of insulin  [facility])   [facility] Austine Lefort, MD   4 months ago Type 2 diabetes mellitus with hyperglycemia, with long-term current use of insulin   [facility])   [facility] Austine Lefort, MD   4 months ago Cellulitis of right lower extremity   [facility] Austine Lefort, MD   5 months ago Type 2 diabetes mellitus with hyperglycemia, with long-term current use of insulin  [facility])   [facility] Family Medicine Pickard, Cisco Crest, MD       Future Appointments             In 3 months Joshua Hals, Joshua Escobar [facility]

## 2023-11-09 NOTE — Progress Notes (Signed)
 Subjective:    Joshua Escobar ID: Joshua Escobar, male    DOB: 1971/05/20, 53 y.o.   MRN: [id number]  HPI Date of Admission: 10/31/2023 Date of Discharge: 11/02/2023   Attending Physician:Jeffrey Constantine Delude, MD   Joshua Escobar's WGN:[id number], Cisco Crest, MD   Disposition: D/C home    Follow-up Appts:   Follow-up Information       Austine Lefort, MD. Schedule an appointment as soon as possible for a visit in 7 day(s).   Specialty: Family Medicine Contact information: [address] [place] [address] [phone]                          Discharge Diagnoses: Acute idiopathic pancreatitis Chest pain - atypical  Uncontrolled DM2 with hyperglycemia     Initial presentation: 53 year old with a history of DM2, HTN, HLD, and a prior episode of pancreatitis May 2024 who presented to the ER 6/4 with abdominal pain with nausea and vomiting. Joshua Escobar was seen in the ER 10/26/2023 for the same complaints at which time a lipase was 806 and LFTs were noted to be normal. CT A/P noted pancreatic edema suggestive of acute pancreatitis with no evidence of other complications. Joshua Escobar was sent home after that initial ER visit, but returned to the ER 10/31/2023 with recurrence of his epigastric pain radiating through to his back with nausea and vomiting. Joshua Escobar reported only occasional alcohol intake. RUQ US  at the time of his 2nd presentation was without acute findings confirming a normal common bile duct and no gallstones.    Hospital Course:   Acute idiopathic pancreatitis No evidence of gallstones or choledocholithiasis -denies history of significant alcohol intake -triglycerides are normal - slowly advanced diet without difficulty - pain well controlled at time of d/c w/ Joshua Escobar able to eat w/o difficulty - advised to avoid alcohol entirely, and to avoid high fat foods/heavy meals for the next 3-5 days    Chest pain Likely simply referred pain from epigastrium - EKG consistent with his baseline and  troponin entirely normal - pain resolved spontaneously    DM2 with hyperglycemia A1c 11.9 10/12/2023 -counseled by DM educator during this hospital stay -advised to comply strictly with his home treatment regimen -has reportedly been referred to endocrinology for further evaluation -encouraged Joshua Escobar to keep outpatient appointment   Allergies as of 11/02/2023   No Known Allergies         Medication List       TAKE these medications     acetaminophen  325 MG tablet Commonly known as: TYLENOL  Take 2 tablets (650 mg total) by mouth every 6 (six) hours as needed for mild pain (pain score 1-3) or fever (or Fever >/= 101).    cholestyramine  4 g packet Commonly known as: Questran  Take 1 packet (4 g total) by mouth at bedtime.    ibuprofen 200 MG tablet Commonly known as: ADVIL Take 200-800 mg by mouth every 6 (six) hours as needed for moderate pain (pain score 4-6).    insulin  glargine 100 UNIT/ML Solostar Pen Commonly known as: LANTUS  Inject 30 Units into the skin 2 (two) times daily. Appointment needed. What changed: how much to take    insulin  lispro 100 UNIT/ML KwikPen Commonly known as: HumaLOG  KwikPen Inject 8 Units into the skin 3 (three) times daily.    meclizine  25 MG tablet Commonly known as: ANTIVERT  Take 1 tablet (25 mg total) by mouth 3 (three) times daily as needed for  dizziness (vertigo).    ondansetron  4 MG tablet Commonly known as: ZOFRAN  Take 1 tablet (4 mg total) by mouth every 6 (six) hours as needed for up to 30 doses for nausea or vomiting.    oxyCODONE  5 MG immediate release tablet Commonly known as: Roxicodone  Take 1 tablet (5 mg total) by mouth every 6 (six) hours as needed for severe pain (pain score 7-10).    VITAMIN D PO Take 1 tablet by mouth daily.         I reviewed the Joshua Escobar's glucometer readings.  Over the last 14 days his average blood sugars are ranging between 200 and 300.  This was despite him saying that his sugars were good.  I  explained to the Joshua Escobar that we want his sugars to fall between 80 and 160.  Joshua Escobar supposed to be on 30 units of Lantus  twice daily.  Joshua Escobar admits that Joshua Escobar is missing the morning dose of Lantus  20% of the time.  Joshua Escobar is consistent taking the evening 30 units 100% of the time.  His abdominal pain is better.  There were no gallstones seen.  Joshua Escobar denies drinking any alcohol.  His triglycerides were outstanding.  Calcium  level normal.  Joshua Escobar did a history previous pancreatitis when Joshua Escobar was in [place] .  Joshua Escobar states that they never determine the cause. Past Medical History:  Diagnosis Date   MI (myocardial infarction) (HCC) 10/2019   Migraine headache    Type II diabetes mellitus (HCC)    Past Surgical History:  Procedure Laterality Date   BIOPSY  11/16/2020   Procedure: BIOPSY;  Surgeon: Urban Garden, MD;  [facility];  Service: Gastroenterology;;   COLONOSCOPY WITH PROPOFOL  N/A 09/11/2020   Procedure: COLONOSCOPY WITH PROPOFOL ;  Surgeon: Urban Garden, MD;  [facility];  Service: Gastroenterology;  Laterality: N/A;   ESOPHAGOGASTRODUODENOSCOPY (EGD) WITH PROPOFOL  N/A 11/16/2020   Procedure: ESOPHAGOGASTRODUODENOSCOPY (EGD) WITH PROPOFOL ;  Surgeon: Urban Garden, MD;  [facility];  Service: Gastroenterology;  Laterality: N/A;  12:10   LEFT HEART CATH AND CORONARY ANGIOGRAPHY N/A 11/27/2016   Procedure: Left Heart Cath and Coronary Angiography;  Surgeon: Avanell Leigh, MD;  [facility] INV[facility] CV LAB;  Service: Cardiovascular;  Laterality: N/A;   Current Outpatient Medications on File Prior to Visit  Medication Sig Dispense Refill   acetaminophen  (TYLENOL ) 325 MG tablet Take 2 tablets (650 mg total) by mouth every 6 (six) hours as needed for mild pain (pain score 1-3) or fever (or Fever >/= 101).     aspirin  EC 81 MG tablet Take 1 tablet (81 mg total) by mouth daily. Swallow whole.     cholestyramine  (QUESTRAN ) 4 g packet Take 1 packet (4 g  total) by mouth at bedtime. 30 each 12   Continuous Glucose Sensor (FREESTYLE LIBRE 3 PLUS SENSOR) MISC Inject 1 each into the skin daily.     insulin  glargine (LANTUS ) 100 UNIT/ML Solostar Pen Inject 30 Units into the skin 2 (two) times daily. Appointment needed. (Joshua Escobar taking differently: Inject 20 Units into the skin 2 (two) times daily. Appointment needed.) 15 mL 1   insulin  lispro (HUMALOG  KWIKPEN) 100 UNIT/ML KwikPen Inject 8 Units into the skin 3 (three) times daily. 15 mL 11   meclizine  (ANTIVERT ) 25 MG tablet Take 1 tablet (25 mg total) by mouth 3 (three) times daily as needed for dizziness (vertigo). 30 tablet 0   metoprolol  tartrate (LOPRESSOR ) 100 MG tablet Take 1 tablet (100 mg)  2 hours before Coronary CT 1 tablet 0   ondansetron  (ZOFRAN ) 4 MG tablet Take 1 tablet (4 mg total) by mouth every 6 (six) hours as needed for up to 30 doses for nausea or vomiting. 30 tablet 0   VITAMIN D PO Take 1 tablet by mouth daily.     No current facility-administered medications on file prior to visit.   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     Social Drivers of Corporate investment banker Strain: Low Risk  (06/08/2023)   Overall Financial Resource Strain (CARDIA)    Difficulty of Paying Living Expenses: Not hard at all  Food Insecurity: No Food Insecurity (11/01/2023)   Hunger Vital Sign    Worried About Running Out of Food in the Last Year: Never true    Ran Out of Food in the Last Year: Never true  Transportation Needs: No Transportation Needs (11/01/2023)   PRAPARE - Administrator, Civil Service (Medical): No    Lack of Transportation (Non-Medical): No  Physical Activity: Sufficiently Active (06/08/2023)   Exercise Vital Sign    Days of Exercise per Week: 7 days    Minutes of Exercise per Session: 60 min  Stress: No Stress Concern Present (06/08/2023)   [employer] of Occupational Health - Occupational Stress Questionnaire    Feeling of Stress : Not at all  Social Connections: Socially Isolated (06/08/2023)   Social Connection and Isolation Panel    Frequency of Communication with Friends and Family: More than three times a week    Frequency of Social Gatherings with Friends and Family: More than three times a week    Attends Religious Services: Never    Database administrator or Organizations: No    Attends Engineer, structural: Not on file    Marital Status: Divorced  Intimate Partner Violence: Joshua Escobar Declined (11/01/2023)   Humiliation, Afraid, Rape, and Kick questionnaire    Fear of Current or Ex-Partner: Joshua Escobar declined    Emotionally Abused: Joshua Escobar declined    Physically Abused: Joshua Escobar declined    Sexually Abused: Joshua Escobar declined        Review of Systems     Objective:   Physical Exam Vitals reviewed.  Constitutional:      Appearance: Normal appearance. Joshua Escobar is normal weight.   Cardiovascular:     Rate and Rhythm: Normal rate and regular rhythm.     Heart sounds: Normal heart sounds.  Pulmonary:     Effort: Pulmonary  effort is normal.     Breath sounds: Normal breath  sounds.   Neurological:     Mental Status: Joshua Escobar is alert.           Assessment & Plan:  Type 2 diabetes mellitus with hyperglycemia, with long-term current use of insulin  (HCC) Joshua Escobar's diabetes is poorly controlled with a hemoglobin A1c above 11.  I recommended an endocrinologist.  In the meantime I recommended switching his Lantus  to 70 units once a day in the morning to try to facilitate compliance.  I want him to call me back in 1 week and give me an update.  I would like to see the majority of his sugars to be between 80 and 160.  Joshua Escobar is using continuous blood glucose monitoring.  Recommended avoidance of all alcohol.  I believe his pancreatitis may have been triggered due to his history of previous pancreatitis, poorly controlled diabetes.

## 2023-11-09 NOTE — Telephone Encounter (Signed)
 [facility], [place]. [phone]  is requesting Metoprolol  Tartrate 100 mg. Prescription is in only for Coronary CT to be taken 2 hours prior to procedure. Order was  placed on 11/07/2023. Please advise. Thank you

## 2023-11-15 ENCOUNTER — Telehealth: Payer: Self-pay

## 2023-11-15 NOTE — Telephone Encounter (Signed)
 Copied from CRM [id number]. Topic: General - Other >> Nov 15, 2023  2:47 PM Fonda T wrote: Reason for CRM: Patient calling, states Employer, [employer], is requesting another note for work is needed, to allow patient to have phone with him at all times due to access of Libre sensor/app that is downloaded on phone.  Per patient states, employer, [employer], needs another note with above details indicated.  Note can be faxed to Fax # [phone]  Patient can be reached if any additional questions or concerns at [phone].

## 2023-11-19 ENCOUNTER — Ambulatory Visit: Payer: Self-pay | Admitting: Internal Medicine

## 2023-11-19 ENCOUNTER — Telehealth (HOSPITAL_COMMUNITY): Payer: Self-pay | Admitting: *Deleted

## 2023-11-19 ENCOUNTER — Encounter: Payer: Self-pay | Admitting: Cardiology

## 2023-11-19 NOTE — Telephone Encounter (Signed)
 Reaching out to patient to offer assistance regarding upcoming cardiac imaging study; patient request to r/s.  Appt rescheduled.  Joshua Frame RN Navigator Cardiac Imaging [facility] [phone] office [phone] cell

## 2023-11-20 ENCOUNTER — Ambulatory Visit (HOSPITAL_COMMUNITY)

## 2023-11-27 ENCOUNTER — Telehealth (HOSPITAL_COMMUNITY): Payer: Self-pay | Admitting: *Deleted

## 2023-11-27 NOTE — Telephone Encounter (Signed)
 Attempted to call patient regarding upcoming cardiac CT appointment. Left message on voicemail with name and callback number  Larey Brick RN Navigator Cardiac Imaging [facility] [phone] Office [phone] Cell

## 2023-11-28 ENCOUNTER — Ambulatory Visit (HOSPITAL_COMMUNITY)
Admission: RE | Admit: 2023-11-28 | Discharge: 2023-11-28 | Disposition: A | Discharge status: Home / Self Care | Source: Ambulatory Visit | Attending: Cardiology | Admitting: Cardiology

## 2023-11-28 DIAGNOSIS — Z794 Long term (current) use of insulin: Secondary | ICD-10-CM | POA: Diagnosis present

## 2023-11-28 DIAGNOSIS — K859 Acute pancreatitis without necrosis or infection, unspecified: Secondary | ICD-10-CM | POA: Diagnosis present

## 2023-11-28 DIAGNOSIS — R072 Precordial pain: Secondary | ICD-10-CM | POA: Diagnosis present

## 2023-11-28 DIAGNOSIS — E1165 Type 2 diabetes mellitus with hyperglycemia: Secondary | ICD-10-CM | POA: Insufficient documentation

## 2023-11-28 MED ORDER — NITROGLYCERIN 0.4 MG SL SUBL
0.8000 mg | SUBLINGUAL_TABLET | Freq: Once | SUBLINGUAL | Status: AC
  Administered 2023-11-28: 0.8 mg via SUBLINGUAL

## 2023-11-28 MED ORDER — IOHEXOL 350 MG/ML SOLN
100.0000 mL | Freq: Once | INTRAVENOUS | Status: AC | PRN
  Administered 2023-11-28: 100 mL via INTRAVENOUS

## 2023-11-28 MED ORDER — DILTIAZEM HCL 25 MG/5ML IV SOLN
10.0000 mg | INTRAVENOUS | Status: DC | PRN
Start: 2023-11-28 — End: 2023-11-29

## 2023-11-28 MED ORDER — METOPROLOL TARTRATE 5 MG/5ML IV SOLN: 10.0000 mg | Freq: Once | INTRAVENOUS | Status: DC | PRN

## 2023-11-29 NOTE — Progress Notes (Signed)
 Minimal calcium  in heart artery, but no severe blockages. Cholesterol is well-controlled.  However, given diabetes, recommend at least low-dose cholesterol medication like simvastatin 10 mg daily.  However, given his recent pancreatitis, I will defer to PCP regarding timing initiation of this.  Thanks MJP

## 2023-12-03 ENCOUNTER — Other Ambulatory Visit: Payer: Self-pay

## 2023-12-03 MED ORDER — ROSUVASTATIN CALCIUM 20 MG PO TABS: 20.0000 mg | ORAL_TABLET | Freq: Every day | ORAL | 3 refills | Status: DC

## 2023-12-21 ENCOUNTER — Ambulatory Visit (HOSPITAL_COMMUNITY)
Admission: RE | Admit: 2023-12-21 | Payer: Self-pay | Source: Ambulatory Visit | Attending: Cardiology | Admitting: Cardiology

## 2023-12-25 ENCOUNTER — Encounter (HOSPITAL_COMMUNITY): Payer: Self-pay | Admitting: Cardiology

## 2024-01-11 ENCOUNTER — Ambulatory Visit: Admitting: Family Medicine

## 2024-01-14 ENCOUNTER — Ambulatory Visit: Payer: Self-pay

## 2024-01-14 NOTE — Telephone Encounter (Signed)
 Answer Assessment - Initial Assessment Questions 1. REASON FOR CALL or QUESTION: What is your reason for calling today? or How can I best   Patient states that while at work, he felt that his BS was low. He did not check it. He then ate candy and it went up to 116. No issues since and states work is requiring paperwork be filled out by provider prior to returning to work. He will attempt to upload to MyChart.  Protocols used: PCP Call - No Triage-A-AH

## 2024-01-14 NOTE — Telephone Encounter (Addendum)
 Message from [place] S sent at 01/14/2024  4:09 PM EDT  patient blood sugar dropped while working he calling because paper work is needed for work. No other symptoms

## 2024-01-15 ENCOUNTER — Encounter: Payer: Self-pay | Admitting: Family Medicine

## 2024-01-15 ENCOUNTER — Ambulatory Visit (INDEPENDENT_AMBULATORY_CARE_PROVIDER_SITE_OTHER): Payer: Self-pay | Admitting: Family Medicine

## 2024-01-15 VITALS — BP 104/70 | HR 84 | Temp 98.2°F | Ht 68.0 in | Wt 162.0 lb

## 2024-01-15 DIAGNOSIS — Z23 Encounter for immunization: Secondary | ICD-10-CM

## 2024-01-15 DIAGNOSIS — Z794 Long term (current) use of insulin: Secondary | ICD-10-CM

## 2024-01-15 DIAGNOSIS — E119 Type 2 diabetes mellitus without complications: Secondary | ICD-10-CM

## 2024-01-15 NOTE — Progress Notes (Signed)
 Patient Office Visit  Assessment & Plan:  Insulin  dependent type 2 diabetes mellitus (HCC)  Need for pneumococcal 20-valent conjugate vaccination -     Pneumococcal conjugate vaccine 20-valent   Assessment and Plan    Type 2 diabetes mellitus, insulin  requiring Type 2 diabetes with improved A1c from 14 to 11.9. Current insulin  regimen effective with no recent hypoglycemia (other than yesterday). - Continue Lantus  40 units in the morning for now - Administer fast-acting insulin  4-5 units with meals. - Maintain regular meals and snacks to prevent hypoglycemia. - Attend endocrinology appointment in September. - Ensure eye exam is up to date for diabetic retinopathy screening. - Administer Prevnar 20 pneumonia vaccine. - Schedule flu shot for the fall.       Return if symptoms worsen or fail to improve.   Subjective:    Patient ID: Joshua Escobar, male    DOB: 1971/01/08  Age: 53 y.o. MRN: [id number]  No chief complaint on file.   HPI Discussed the use of AI scribe software for clinical note transcription with the patient, who gave verbal consent to proceed.  History of Present Illness        Joshua Escobar is a 53 year old male with diabetes who presents for a work form and uncontrolled type 2 diabetes management. He is here for form completion for work  He has been managing type 2 diabetes with insulin  therapy for about three to four years. His A1c was very high in June but improved -he reported that it had gone down since then from greater than 14 to over 11.  Previously, he was on a sliding scale for fast-acting insulin  8 units with meals  and took Lantus  30 units twice a day, which was adjusted to 40 units once a day in the morning (was initially told to take total 70 units lantus  per day)  Yesterday, he experienced a hypoglycemic episode with a blood sugar level of 52 mg/dL, which he managed by consuming candy, raising it to 116 mg/dL. He regularly monitors his blood  sugar and carries his meter. After eating something sweet, his blood sugar was 132 mg/dL at the nurse's station at work  To prevent hypoglycemia, he adjusted his insulin  regimen to 40 units of Lantus  in the morning and will reduce fast-acting insulin  to 4-5 units while at work. 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 He maintains regular meals and carries healthy snacks like fruits, celery, and carrots to stabilize blood sugar levels.  He has stopped drinking alcohol and smoking, aiming for a healthier lifestyle for his daughters. He is scheduled for an endocrinology appointment in September and an eye exam for diabetes management. He usually does not skip breakfast, which includes fruits and cheese.  No recent episodes of hypoglycemia except for one incident yesterday. No skipped meals, and he ensures regular eating habits to maintain stable blood sugar levels. Physical Exam Results LABS A1c: 11.9 (10/12/2023) Blood Glucose: 116, 132, 52 (01/14/2024) Assessment & Plan Type 2 diabetes mellitus, insulin  requiring Type 2 diabetes with improved A1c from 14 to 11.9. Current insulin  regimen effective with no recent hypoglycemia (other than yesterday). - Continue Lantus  40 units in the morning for now - Administer fast-acting insulin  4-5 units with meals. - Maintain regular meals and snacks to prevent hypoglycemia. - Attend endocrinology appointment in September. - Ensure eye exam is up to date for diabetic retinopathy screening. - Administer Prevnar 20 pneumonia vaccine. -  Schedule flu shot for the fall.    The ASCVD Risk score (Arnett DK, et al., 2019) failed to calculate for the following reasons:   Risk score cannot be calculated because patient has a medical history suggesting prior/existing ASCVD  Past Medical History:  Diagnosis Date   MI (myocardial infarction) (HCC) 10/2019   Migraine headache    Type II  diabetes mellitus (HCC)    Past Surgical History:  Procedure Laterality Date   BIOPSY  11/16/2020   Procedure: BIOPSY;  Surgeon: Eartha Angelia Sieving, MD;  [facility];  Service: Gastroenterology;;   COLONOSCOPY WITH PROPOFOL  N/A 09/11/2020   Procedure: COLONOSCOPY WITH PROPOFOL ;  Surgeon: Eartha Angelia Sieving, MD;  [facility];  Service: Gastroenterology;  Laterality: N/A;   ESOPHAGOGASTRODUODENOSCOPY (EGD) WITH PROPOFOL  N/A 11/16/2020   Procedure: ESOPHAGOGASTRODUODENOSCOPY (EGD) WITH PROPOFOL ;  Surgeon: Eartha Angelia Sieving, MD;  [facility];  Service: Gastroenterology;  Laterality: N/A;  12:10   LEFT HEART CATH AND CORONARY ANGIOGRAPHY N/A 11/27/2016   Procedure: Left Heart Cath and Coronary Angiography;  Surgeon: Court Dorn PARAS, MD;  Location: [facility] INVASIVE CV LAB;  Service: Cardiovascular;  Laterality: N/A;   Social History   Tobacco Use   Smoking status: Former    Current packs/day: 0.25    Average packs/day: 0.3 packs/day for 23.3 years (6.0 ttl pk-yrs)    Types: Cigarettes   Smokeless tobacco: Never  Vaping Use   Vaping status: Former   Substances: Nicotine, Flavoring  Substance Use Topics   Alcohol use: Yes    Alcohol/week: 1.0 standard drink of alcohol    Types: 1 Cans of beer per week    Comment: weekends only   Drug use: No   Family History  Problem Relation Age of Onset   Diabetes Mellitus II Sister    Diabetes Sister    Diabetes Mellitus II Brother    Diabetes Brother    Diabetes Mellitus II Mother    Diabetes Mother    Diabetes Mellitus II Father    Diabetes Father    Heart disease Father    No Known Allergies  ROS    Objective:    BP 104/70   Pulse 84   Temp 98.2 F (36.8 C)   Ht 5' 8 (1.727 m)   Wt 162 lb (73.5 kg)   SpO2 98%   BMI 24.63 kg/m  BP Readings from Last 3 Encounters:  01/15/24 104/70  11/28/23 124/89  11/09/23 118/81   Wt Readings from Last 3 Encounters:  01/15/24 162 lb  (73.5 kg)  11/07/23 159 lb (72.1 kg)  10/31/23 162 lb 14.7 oz (73.9 kg)    Physical Exam Vitals and nursing note reviewed.  Constitutional:      General: He is not in acute distress.    Appearance: Normal appearance.  HENT:     Head: Normocephalic.     Right Ear: Tympanic membrane, ear canal and external ear normal.     Left Ear: Tympanic membrane, ear canal and external ear normal.  Eyes:     Extraocular Movements: Extraocular movements intact.     Pupils: Pupils are equal, round, and reactive to light.  Cardiovascular:     Rate and Rhythm: Normal rate and regular rhythm.     Heart sounds: Normal heart sounds.  Pulmonary:     Effort: Pulmonary effort is normal.     Breath sounds: Normal breath sounds.  Musculoskeletal:     Right lower leg: No edema.  Left lower leg: No edema.  Neurological:     General: No focal deficit present.     Mental Status: He is alert and oriented to person, place, and time.  Psychiatric:        Mood and Affect: Mood normal.        Behavior: Behavior normal.      No results found for any visits on 01/15/24.

## 2024-01-17 ENCOUNTER — Ambulatory Visit: Payer: Self-pay | Admitting: Family Medicine

## 2024-01-31 ENCOUNTER — Ambulatory Visit: Admitting: Family Medicine

## 2024-02-12 ENCOUNTER — Ambulatory Visit: Payer: Self-pay | Admitting: Nurse Practitioner

## 2024-02-15 ENCOUNTER — Encounter: Payer: Self-pay | Admitting: Cardiology

## 2024-02-18 ENCOUNTER — Ambulatory Visit: Admitting: Family Medicine

## 2024-02-29 ENCOUNTER — Ambulatory Visit: Admitting: Family Medicine

## 2024-03-10 ENCOUNTER — Inpatient Hospital Stay (HOSPITAL_COMMUNITY)
Admission: EM | Admit: 2024-03-10 | Discharge: 2024-03-14 | DRG: 638 | Disposition: A | Discharge status: Home / Self Care | Attending: Hospitalist | Admitting: Hospitalist

## 2024-03-10 ENCOUNTER — Other Ambulatory Visit: Payer: Self-pay

## 2024-03-10 ENCOUNTER — Ambulatory Visit: Admitting: Family Medicine

## 2024-03-10 ENCOUNTER — Encounter (HOSPITAL_COMMUNITY): Payer: Self-pay

## 2024-03-10 ENCOUNTER — Emergency Department (HOSPITAL_COMMUNITY)

## 2024-03-10 DIAGNOSIS — Z79899 Other long term (current) drug therapy: Secondary | ICD-10-CM

## 2024-03-10 DIAGNOSIS — I252 Old myocardial infarction: Secondary | ICD-10-CM

## 2024-03-10 DIAGNOSIS — F1010 Alcohol abuse, uncomplicated: Secondary | ICD-10-CM | POA: Diagnosis present

## 2024-03-10 DIAGNOSIS — Z794 Long term (current) use of insulin: Secondary | ICD-10-CM

## 2024-03-10 DIAGNOSIS — R079 Chest pain, unspecified: Secondary | ICD-10-CM | POA: Diagnosis present

## 2024-03-10 DIAGNOSIS — K861 Other chronic pancreatitis: Secondary | ICD-10-CM | POA: Diagnosis present

## 2024-03-10 DIAGNOSIS — E1110 Type 2 diabetes mellitus with ketoacidosis without coma: Secondary | ICD-10-CM | POA: Diagnosis not present

## 2024-03-10 DIAGNOSIS — B356 Tinea cruris: Secondary | ICD-10-CM | POA: Diagnosis present

## 2024-03-10 DIAGNOSIS — E11649 Type 2 diabetes mellitus with hypoglycemia without coma: Secondary | ICD-10-CM | POA: Diagnosis not present

## 2024-03-10 DIAGNOSIS — Z91128 Patient's intentional underdosing of medication regimen for other reason: Secondary | ICD-10-CM

## 2024-03-10 DIAGNOSIS — K219 Gastro-esophageal reflux disease without esophagitis: Secondary | ICD-10-CM | POA: Diagnosis present

## 2024-03-10 DIAGNOSIS — E0810 Diabetes mellitus due to underlying condition with ketoacidosis without coma: Principal | ICD-10-CM

## 2024-03-10 DIAGNOSIS — N1330 Unspecified hydronephrosis: Secondary | ICD-10-CM | POA: Diagnosis present

## 2024-03-10 DIAGNOSIS — E1165 Type 2 diabetes mellitus with hyperglycemia: Secondary | ICD-10-CM

## 2024-03-10 DIAGNOSIS — Z8249 Family history of ischemic heart disease and other diseases of the circulatory system: Secondary | ICD-10-CM

## 2024-03-10 DIAGNOSIS — E876 Hypokalemia: Secondary | ICD-10-CM | POA: Diagnosis not present

## 2024-03-10 DIAGNOSIS — I2510 Atherosclerotic heart disease of native coronary artery without angina pectoris: Secondary | ICD-10-CM | POA: Diagnosis present

## 2024-03-10 DIAGNOSIS — R9431 Abnormal electrocardiogram [ECG] [EKG]: Secondary | ICD-10-CM | POA: Diagnosis present

## 2024-03-10 DIAGNOSIS — T383X6A Underdosing of insulin and oral hypoglycemic [antidiabetic] drugs, initial encounter: Secondary | ICD-10-CM | POA: Diagnosis present

## 2024-03-10 DIAGNOSIS — F17210 Nicotine dependence, cigarettes, uncomplicated: Secondary | ICD-10-CM | POA: Diagnosis present

## 2024-03-10 DIAGNOSIS — Z7982 Long term (current) use of aspirin: Secondary | ICD-10-CM

## 2024-03-10 DIAGNOSIS — Z833 Family history of diabetes mellitus: Secondary | ICD-10-CM

## 2024-03-10 DIAGNOSIS — N179 Acute kidney failure, unspecified: Principal | ICD-10-CM | POA: Diagnosis present

## 2024-03-10 DIAGNOSIS — E785 Hyperlipidemia, unspecified: Secondary | ICD-10-CM | POA: Diagnosis present

## 2024-03-10 LAB — BASIC METABOLIC PANEL WITH GFR
Anion gap: 10 (ref 5–15)
Anion gap: 8 (ref 5–15)
BUN: 40 mg/dL — ABNORMAL HIGH (ref 6–20)
BUN: 37 mg/dL — ABNORMAL HIGH (ref 6–20)
CO2: 22 mmol/L (ref 22–32)
CO2: 26 mmol/L (ref 22–32)
Calcium: 8.8 mg/dL — ABNORMAL LOW (ref 8.9–10.3)
Calcium: 8.7 mg/dL — ABNORMAL LOW (ref 8.9–10.3)
Chloride: 104 mmol/L (ref 98–111)
Chloride: 105 mmol/L (ref 98–111)
Creatinine, Ser: 1.63 mg/dL — ABNORMAL HIGH (ref 0.61–1.24)
Creatinine, Ser: 1.19 mg/dL (ref 0.61–1.24)
GFR, Estimated: 50 mL/min — ABNORMAL LOW (ref >60)
GFR, Estimated: >60 mL/min (ref >60)
Glucose, Bld: 339 mg/dL — ABNORMAL HIGH (ref 70–99)
Glucose, Bld: 165 mg/dL — ABNORMAL HIGH (ref 70–99)
Potassium: 3.4 mmol/L — ABNORMAL LOW (ref 3.5–5.1)
Potassium: 4.8 mmol/L (ref 3.5–5.1)
Sodium: 136 mmol/L (ref 135–145)
Sodium: 139 mmol/L (ref 135–145)

## 2024-03-10 LAB — CBG MONITORING, ED
Glucose-Capillary: 473 mg/dL — ABNORMAL HIGH (ref 70–99)
Glucose-Capillary: 419 mg/dL — ABNORMAL HIGH (ref 70–99)
Glucose-Capillary: 365 mg/dL — ABNORMAL HIGH (ref 70–99)
Glucose-Capillary: 243 mg/dL — ABNORMAL HIGH (ref 70–99)
Glucose-Capillary: 211 mg/dL — ABNORMAL HIGH (ref 70–99)
Glucose-Capillary: 162 mg/dL — ABNORMAL HIGH (ref 70–99)
Glucose-Capillary: 225 mg/dL — ABNORMAL HIGH (ref 70–99)
Glucose-Capillary: 152 mg/dL — ABNORMAL HIGH (ref 70–99)
Glucose-Capillary: 123 mg/dL — ABNORMAL HIGH (ref 70–99)
Glucose-Capillary: 167 mg/dL — ABNORMAL HIGH (ref 70–99)
Glucose-Capillary: 215 mg/dL — ABNORMAL HIGH (ref 70–99)

## 2024-03-10 LAB — URINALYSIS, ROUTINE W REFLEX MICROSCOPIC
Bilirubin Urine: NEGATIVE
Glucose, UA: >=500 mg/dL — AB
Ketones, ur: 20 mg/dL — AB
Leukocytes,Ua: NEGATIVE
Nitrite: NEGATIVE
Protein, ur: NEGATIVE mg/dL
Specific Gravity, Urine: 1.026 (ref 1.005–1.030)
pH: 5 (ref 5.0–8.0)

## 2024-03-10 LAB — I-STAT CHEM 8, ED
BUN: 43 mg/dL — ABNORMAL HIGH (ref 6–20)
Calcium, Ion: 1.18 mmol/L (ref 1.15–1.40)
Chloride: 100 mmol/L (ref 98–111)
Creatinine, Ser: 1.6 mg/dL — ABNORMAL HIGH (ref 0.61–1.24)
Glucose, Bld: 507 mg/dL (ref 70–99)
HCT: 44 % (ref 39.0–52.0)
Hemoglobin: 15 g/dL (ref 13.0–17.0)
Potassium: 4 mmol/L (ref 3.5–5.1)
Sodium: 139 mmol/L (ref 135–145)
TCO2: 23 mmol/L (ref 22–32)

## 2024-03-10 LAB — I-STAT VENOUS BLOOD GAS, ED
Acid-base deficit: 2 mmol/L (ref 0.0–2.0)
Bicarbonate: 24.7 mmol/L (ref 20.0–28.0)
Calcium, Ion: 1.16 mmol/L (ref 1.15–1.40)
HCT: 43 % (ref 39.0–52.0)
Hemoglobin: 14.6 g/dL (ref 13.0–17.0)
O2 Saturation: 88 %
Potassium: 3.9 mmol/L (ref 3.5–5.1)
Sodium: 138 mmol/L (ref 135–145)
TCO2: 26 mmol/L (ref 22–32)
pCO2, Ven: 46.1 mmHg (ref 44–60)
pH, Ven: 7.337 (ref 7.25–7.43)
pO2, Ven: 59 mmHg — ABNORMAL HIGH (ref 32–45)

## 2024-03-10 LAB — CBC
HCT: 44.7 % (ref 39.0–52.0)
Hemoglobin: 14.2 g/dL (ref 13.0–17.0)
MCH: 25.7 pg — ABNORMAL LOW (ref 26.0–34.0)
MCHC: 31.8 g/dL (ref 30.0–36.0)
MCV: 81 fL (ref 80.0–100.0)
Platelets: 239 K/uL (ref 150–400)
RBC: 5.52 MIL/uL (ref 4.22–5.81)
RDW: 14 % (ref 11.5–15.5)
WBC: 11.8 K/uL — ABNORMAL HIGH (ref 4.0–10.5)
nRBC: 0 % (ref 0.0–0.2)

## 2024-03-10 LAB — COMPREHENSIVE METABOLIC PANEL WITH GFR
ALT: 21 U/L (ref 0–44)
AST: 19 U/L (ref 15–41)
Albumin: 3.9 g/dL (ref 3.5–5.0)
Alkaline Phosphatase: 108 U/L (ref 38–126)
Anion gap: 20 — ABNORMAL HIGH (ref 5–15)
BUN: 43 mg/dL — ABNORMAL HIGH (ref 6–20)
CO2: 19 mmol/L — ABNORMAL LOW (ref 22–32)
Calcium: 9 mg/dL (ref 8.9–10.3)
Chloride: 99 mmol/L (ref 98–111)
Creatinine, Ser: 1.67 mg/dL — ABNORMAL HIGH (ref 0.61–1.24)
GFR, Estimated: 49 mL/min — ABNORMAL LOW (ref >60)
Glucose, Bld: 497 mg/dL — ABNORMAL HIGH (ref 70–99)
Potassium: 3.8 mmol/L (ref 3.5–5.1)
Sodium: 138 mmol/L (ref 135–145)
Total Bilirubin: 1.3 mg/dL — ABNORMAL HIGH (ref 0.0–1.2)
Total Protein: 7.6 g/dL (ref 6.5–8.1)

## 2024-03-10 LAB — LIPASE, BLOOD: Lipase: 25 U/L (ref 11–51)

## 2024-03-10 LAB — BETA-HYDROXYBUTYRIC ACID: Beta-Hydroxybutyric Acid: 5.66 mmol/L — ABNORMAL HIGH (ref 0.05–0.27)

## 2024-03-10 MED ORDER — ACETAMINOPHEN 325 MG PO TABS: 650.0000 mg | ORAL_TABLET | Freq: Four times a day (QID) | ORAL | Status: DC | PRN

## 2024-03-10 MED ORDER — HYDRALAZINE HCL 20 MG/ML IJ SOLN: 5.0000 mg | Freq: Four times a day (QID) | INTRAMUSCULAR | Status: DC | PRN

## 2024-03-10 MED ORDER — ASPIRIN 81 MG PO TBEC
81.0000 mg | DELAYED_RELEASE_TABLET | Freq: Every day | ORAL | Status: DC
  Administered 2024-03-10: 81 mg via ORAL
  Filled 2024-03-10: qty 1

## 2024-03-10 MED ORDER — ONDANSETRON 4 MG PO TBDP
4.0000 mg | ORAL_TABLET | Freq: Once | ORAL | Status: AC | PRN
  Administered 2024-03-10: 4 mg via ORAL
  Filled 2024-03-10: qty 1

## 2024-03-10 MED ORDER — POTASSIUM CHLORIDE CRYS ER 20 MEQ PO TBCR
40.0000 meq | EXTENDED_RELEASE_TABLET | Freq: Once | ORAL | Status: AC
  Administered 2024-03-10: 40 meq via ORAL
  Filled 2024-03-10: qty 2

## 2024-03-10 MED ORDER — ACETAMINOPHEN 650 MG RE SUPP: 650.0000 mg | Freq: Four times a day (QID) | RECTAL | Status: DC | PRN

## 2024-03-10 MED ORDER — MORPHINE SULFATE (PF) 4 MG/ML IV SOLN
4.0000 mg | Freq: Once | INTRAVENOUS | Status: AC
  Administered 2024-03-10: 4 mg via INTRAVENOUS
  Filled 2024-03-10: qty 1

## 2024-03-10 MED ORDER — LACTATED RINGERS IV BOLUS
1000.0000 mL | Freq: Once | INTRAVENOUS | Status: AC
  Administered 2024-03-10: 1000 mL via INTRAVENOUS

## 2024-03-10 MED ORDER — HEPARIN SODIUM (PORCINE) 5000 UNIT/ML IJ SOLN
5000.0000 [IU] | Freq: Three times a day (TID) | INTRAMUSCULAR | Status: DC
  Administered 2024-03-10 – 2024-03-11 (×3): 5000 [IU] via SUBCUTANEOUS
  Filled 2024-03-10 (×3): qty 1

## 2024-03-10 MED ORDER — POTASSIUM CHLORIDE 10 MEQ/100ML IV SOLN
10.0000 meq | INTRAVENOUS | Status: AC
  Administered 2024-03-10 (×4): 10 meq via INTRAVENOUS
  Filled 2024-03-10 (×4): qty 100

## 2024-03-10 MED ORDER — SODIUM CHLORIDE 0.9% FLUSH: 3.0000 mL | INTRAVENOUS | Status: DC | PRN

## 2024-03-10 MED ORDER — INSULIN REGULAR(HUMAN) IN NACL 100-0.9 UT/100ML-% IV SOLN
INTRAVENOUS | Status: DC
  Administered 2024-03-10: 11 [IU]/h via INTRAVENOUS
  Filled 2024-03-10: qty 100

## 2024-03-10 MED ORDER — POLYETHYLENE GLYCOL 3350 17 G PO PACK: 17.0000 g | PACK | Freq: Every day | ORAL | Status: DC | PRN

## 2024-03-10 MED ORDER — ONDANSETRON HCL 4 MG PO TABS: 4.0000 mg | ORAL_TABLET | Freq: Four times a day (QID) | ORAL | Status: DC | PRN

## 2024-03-10 MED ORDER — SODIUM CHLORIDE 0.9 % IV SOLN: 250.0000 mL | INTRAVENOUS | Status: AC | PRN

## 2024-03-10 MED ORDER — ONDANSETRON HCL 4 MG/2ML IJ SOLN
4.0000 mg | Freq: Four times a day (QID) | INTRAMUSCULAR | Status: DC | PRN
  Administered 2024-03-12 (×2): 4 mg via INTRAVENOUS
  Filled 2024-03-10 (×2): qty 2

## 2024-03-10 MED ORDER — DEXTROSE 50 % IV SOLN
0.0000 mL | INTRAVENOUS | Status: DC | PRN
  Administered 2024-03-13: 25 mL via INTRAVENOUS
  Filled 2024-03-10: qty 50

## 2024-03-10 MED ORDER — INSULIN GLARGINE 100 UNIT/ML ~~LOC~~ SOLN
30.0000 [IU] | Freq: Two times a day (BID) | SUBCUTANEOUS | Status: DC
Start: 2024-03-10 — End: 2024-03-12
  Administered 2024-03-11 (×3): 30 [IU] via SUBCUTANEOUS
  Filled 2024-03-10 (×6): qty 0.3

## 2024-03-10 MED ORDER — SODIUM CHLORIDE 0.9% FLUSH
3.0000 mL | Freq: Two times a day (BID) | INTRAVENOUS | Status: DC
  Administered 2024-03-11 – 2024-03-12 (×4): 3 mL via INTRAVENOUS

## 2024-03-10 MED ORDER — INSULIN ASPART 100 UNIT/ML IJ SOLN: 10.0000 [IU] | Freq: Once | INTRAMUSCULAR | Status: DC

## 2024-03-10 MED ORDER — LACTATED RINGERS IV SOLN: INTRAVENOUS | Status: DC

## 2024-03-10 MED ORDER — DEXTROSE IN LACTATED RINGERS 5 % IV SOLN: INTRAVENOUS | Status: DC

## 2024-03-10 NOTE — ED Notes (Signed)
 Sister Hillis Mcphatter called and updated on pt status

## 2024-03-10 NOTE — ED Notes (Signed)
 Bg 152, insulin  titrated to 1.6ml/hr per endotool instructions

## 2024-03-10 NOTE — ED Notes (Signed)
 Sister Luca Dyar [phone] would like an update asap

## 2024-03-10 NOTE — H&P (Addendum)
 [facility] History and Physical  Joshua Escobar FMW:[id number] DOB: 12/29/1970 DOA: 03/10/2024  Referring physician: ED  PCP: Duanne Butler DASEN, MD   Patient is coming from: Home  Chief Complaint: Nausea, vomiting, abdominal pain  HPI:  Joshua Escobar is a 53 y.o. male with past medical history of type 2 diabetes presented hospital with left-sided flank pain with nausea and vomiting for the last 2 to 3 days.  Patient complains of mild urgency and frequency.  Patient stated he has not taken insulin  since Saturday and has not had a bowel movement since then.  He occasionally drinks alcohol and the last drink was probably on Friday.  Smokes 1 to 2 cigarettes a day.  Patient complains of left-sided flank pain worse on palpation. Patient denies denies any sick contacts or recent travel.  Denies fever chills or rigor.  No history of kidney stones in the past.    In the ED , patient was afebrile.  Labs were notable for i-STAT glucose of 500.  WBC slightly elevated at 11.5.  Creatinine was elevated at 1.6.  Anion gap of 20.  Bilirubin slightly elevated 1.3.  Beta hydroxybutyrate was elevated.  Lipase of 25.  CT scan of the abdomen renal study showed mild right-sided hydronephrosis without any renal calculi with distended urinary bladder.  Urinalysis without any signs of infection.In the ED patient was given LR bolus 2 L, started on IV fluids, insulin  drip was initiated and patient was admitted to the hospital for further evaluation and treatment.   Assessment and Plan Active Problems:   DKA (diabetic ketoacidosis) (HCC)  Early DKA.  Patient has been started on insulin  drip in the hospital.  Anion gap of 20 with beta hydroxybutyrate elevation.  Continue insulin  drip for now.  Continue hydration.  Patient takes Lantus  50 daily in insulin  lispro 8 units 3 times a day at home.  Nausea vomiting/Left flank pain.  Could be DKA related/gastroenteritis..  CT scan of the abdomen without acute  findings for mild right sided hydronephrosis without any stone.  Will continue to monitor closely.  Continue symptomatic care including antiemetics and analgesics.  Was on cholestyramine  at home will hold for now.  Hyperlipidemia on Crestor  at home.  Will hold for now  Tobacco smoking, occasional alcohol usage.  Patient does not wish nicotine patch.  Will monitor.  DVT Prophylaxis: Heparin  subcu  Review of Systems:  All systems were reviewed and were negative unless otherwise mentioned in the HPI   Past Medical History:  Diagnosis Date   MI (myocardial infarction) (HCC) 10/2019   Migraine headache    Type II diabetes mellitus (HCC)    Past Surgical History:  Procedure Laterality Date   BIOPSY  11/16/2020   Procedure: BIOPSY;  Surgeon: Eartha Angelia Sieving, MD;  Location: [facility];  Service: Gastroenterology;;   COLONOSCOPY WITH PROPOFOL  N/A 09/11/2020   Procedure: COLONOSCOPY WITH PROPOFOL ;  Surgeon: Eartha Angelia Sieving, MD;  Location: [facility];  Service: Gastroenterology;  Laterality: N/A;   ESOPHAGOGASTRODUODENOSCOPY (EGD) WITH PROPOFOL  N/A 11/16/2020   Procedure: ESOPHAGOGASTRODUODENOSCOPY (EGD) WITH PROPOFOL ;  Surgeon: Eartha Angelia Sieving, MD;  Location: [facility];  Service: Gastroenterology;  Laterality: N/A;  12:10   LEFT HEART CATH AND CORONARY ANGIOGRAPHY N/A 11/27/2016   Procedure: Left Heart Cath and Coronary Angiography;  Surgeon: Court Dorn PARAS, MD;  Location: [facility];  Service: Cardiovascular;  Laterality: N/A;    Social History:  reports that he has quit smoking. His  smoking use included cigarettes. He has a 6 pack-year smoking history. He has never used smokeless tobacco. He reports current alcohol use of about 1.0 standard drink of alcohol per week. He reports that he does not use drugs.  No Known Allergies  Family History  Problem Relation Age of Onset   Diabetes Mellitus II Sister    Diabetes Sister    Diabetes Mellitus  II Brother    Diabetes Brother    Diabetes Mellitus II Mother    Diabetes Mother    Diabetes Mellitus II Father    Diabetes Father    Heart disease Father      Prior to Admission medications   Medication Sig Start Date End Date Taking? Authorizing Provider  acetaminophen  (TYLENOL ) 325 MG tablet Take 2 tablets (650 mg total) by mouth every 6 (six) hours as needed for mild pain (pain score 1-3) or fever (or Fever >/= 101). 11/02/23   Danton Reyes DASEN, MD  aspirin  EC 81 MG tablet Take 1 tablet (81 mg total) by mouth daily. Swallow whole. 11/07/23   Patwardhan, Newman PARAS, MD  cholestyramine  (QUESTRAN ) 4 g packet Take 1 packet (4 g total) by mouth at bedtime. 07/03/23   Duanne Butler DASEN, MD  Continuous Glucose Sensor (FREESTYLE LIBRE 3 PLUS SENSOR) MISC Inject 1 each into the skin daily. 11/07/23   [provider]  insulin  lispro (HUMALOG  KWIKPEN) 100 UNIT/ML KwikPen Inject 8 Units into the skin 3 (three) times daily. 07/03/23   Duanne Butler DASEN, MD  LANTUS  SOLOSTAR 100 UNIT/ML Solostar Pen INJECT 30 UNITS INTO SKIN TWICE DAILY Patient taking differently: Inject 50 Units into the skin daily. 11/09/23   Duanne Butler DASEN, MD  meclizine  (ANTIVERT ) 25 MG tablet Take 1 tablet (25 mg total) by mouth 3 (three) times daily as needed for dizziness (vertigo). 10/05/23   Duanne Butler DASEN, MD  metoprolol  tartrate (LOPRESSOR ) 100 MG tablet Take 1 tablet (100 mg)  2 hours before Coronary CT 11/07/23   Patwardhan, Newman PARAS, MD  ondansetron  (ZOFRAN ) 4 MG tablet Take 1 tablet (4 mg total) by mouth every 6 (six) hours as needed for up to 30 doses for nausea or vomiting. 10/27/23   Jerral Meth, MD  rosuvastatin  (CRESTOR ) 20 MG tablet Take 1 tablet (20 mg total) by mouth daily. 12/03/23   Duanne Butler DASEN, MD  VITAMIN D PO Take 1 tablet by mouth daily.    [provider]    Physical Exam:  Vitals:   03/10/24 1015 03/10/24 1016 03/10/24 1017 03/10/24 1407  BP:   103/65   Pulse: 100     Resp: 16      Temp: 98.5 F (36.9 C)   97.9 F (36.6 C)  TempSrc: Oral   Oral  SpO2: 99%     Weight:  73.5 kg    Height:  5' 8 (1.727 m)     Wt Readings from Last 3 Encounters:  03/10/24 73.5 kg  01/15/24 73.5 kg  11/07/23 72.1 kg   Body mass index is 24.63 kg/m.  General:  Average built, not in obvious distress, mildly somnolent but Communicative, HENT: Normocephalic, No scleral pallor or icterus noted. Oral mucosa is dry Chest:  Clear breath sounds.  . No crackles or wheezes.  CVS: S1 &S2 heard. No murmur.  Regular rate and rhythm. Abdomen: Soft,  nondistended.  Bowel sounds are heard. No abdominal mass palpated, tenderness over the left upper quadrant Extremities: No cyanosis, clubbing or edema.  Peripheral pulses are  palpable. Psych: Mildly somnolent but interactive CNS:  No cranial nerve deficits.  Power equal in all extremities.   Skin: Warm and dry.  No rashes noted.  Labs on Admission:   CBC: Recent Labs  Lab 03/10/24 1018 03/10/24 1201  WBC 11.8*  --   HGB 14.2 14.6  15.0  HCT 44.7 43.0  44.0  MCV 81.0  --   PLT 239  --     Basic Metabolic Panel: Recent Labs  Lab 03/10/24 1018 03/10/24 1201  NA 138 138  139  K 3.8 3.9  4.0  CL 99 100  CO2 19*  --   GLUCOSE 497* 507*  BUN 43* 43*  CREATININE 1.67* 1.60*  CALCIUM  9.0  --     Liver Function Tests: Recent Labs  Lab 03/10/24 1018  AST 19  ALT 21  ALKPHOS 108  BILITOT 1.3*  PROT 7.6  ALBUMIN 3.9   Recent Labs  Lab 03/10/24 1018  LIPASE 25   No results for input(s): AMMONIA in the last 168 hours.  Cardiac Enzymes: No results for input(s): CKTOTAL, CKMB, CKMBINDEX, TROPONINI in the last 168 hours.  BNP (last 3 results) No results for input(s): BNP in the last 8760 hours.  ProBNP (last 3 results) No results for input(s): PROBNP in the last 8760 hours.  CBG: Recent Labs  Lab 03/10/24 1124 03/10/24 1306 03/10/24 1347  GLUCAP 473* 419* 365*    Lipase     Component Value  Date/Time   LIPASE 25 03/10/2024 1018     Urinalysis    Component Value Date/Time   COLORURINE YELLOW 03/10/2024 1052   APPEARANCEUR CLEAR 03/10/2024 1052   LABSPEC 1.026 03/10/2024 1052   PHURINE 5.0 03/10/2024 1052   GLUCOSEU >=500 (A) 03/10/2024 1052   HGBUR SMALL (A) 03/10/2024 1052   BILIRUBINUR NEGATIVE 03/10/2024 1052   KETONESUR 20 (A) 03/10/2024 1052   PROTEINUR NEGATIVE 03/10/2024 1052   UROBILINOGEN 0.2 06/10/2017 1607   NITRITE NEGATIVE 03/10/2024 1052   LEUKOCYTESUR NEGATIVE 03/10/2024 1052     Drugs of Abuse     Component Value Date/Time   LABOPIA NONE DETECTED 06/13/2023 1907   COCAINSCRNUR NONE DETECTED 06/13/2023 1907   LABBENZ NONE DETECTED 06/13/2023 1907   AMPHETMU NONE DETECTED 06/13/2023 1907   THCU NONE DETECTED 06/13/2023 1907   LABBARB NONE DETECTED 06/13/2023 1907      Radiological Exams on Admission: CT Renal Stone Study Result Date: 03/10/2024 CLINICAL DATA:  Flank pain. EXAM: CT ABDOMEN AND PELVIS WITHOUT CONTRAST TECHNIQUE: Multidetector CT imaging of the abdomen and pelvis was performed following the standard protocol without IV contrast. RADIATION DOSE REDUCTION: This exam was performed according to the departmental dose-optimization program which includes automated exposure control, adjustment of the mA and/or kV according to patient size and/or use of iterative reconstruction technique. COMPARISON:  Oct 26, 2023 FINDINGS: Lower chest: No acute abnormality. Hepatobiliary: No focal liver abnormality is seen. No gallstones, gallbladder wall thickening, or biliary dilatation. Pancreas: Unremarkable. No pancreatic ductal dilatation or surrounding inflammatory changes. Spleen: Normal in size without focal abnormality. Adrenals/Urinary Tract: Adrenal glands are unremarkable. Kidneys are normal in size, without obstructing renal calculi or focal lesions. There is mild right-sided hydronephrosis. The urinary bladder is markedly distended and is otherwise  unremarkable. Stomach/Bowel: Stomach is within normal limits. Appendix appears normal. No evidence of bowel wall thickening, distention, or inflammatory changes. Vascular/Lymphatic: Aortic atherosclerosis. No enlarged abdominal or pelvic lymph nodes. Reproductive: Prostate is unremarkable. Other: No abdominal wall hernia or abnormality.  No abdominopelvic ascites. Musculoskeletal: No acute or significant osseous findings. IMPRESSION: 1. Mild right-sided hydronephrosis without evidence of obstructing renal calculi. 2. Markedly distended urinary bladder. 3. Aortic atherosclerosis. Electronically Signed   By: Suzen Dials M.D.   On: 03/10/2024 12:04    EKG: Personally reviewed by me which shows normal sinus rhythm   Consultant: None  Code Status: Full code  Microbiology none  Antibiotics: None  Family Communication:  Patients' condition and plan of care including tests being ordered have been discussed with the patient  who indicate understanding and agree with the plan.   Status is: Observation   Severity of Illness: The appropriate patient status for this patient is OBSERVATION. Observation status is judged to be reasonable and necessary in order to provide the required intensity of service to ensure the patient's safety. The patient's presenting symptoms, physical exam findings, and initial radiographic and laboratory data in the context of their medical condition is felt to place them at decreased risk for further clinical deterioration. Furthermore, it is anticipated that the patient will be medically stable for discharge from the hospital within 2 midnights of admission.   Signed, Vernal Alstrom, MD [facility] 03/10/2024

## 2024-03-10 NOTE — ED Notes (Signed)
 Per admitting team restart insulin  and administer long acting insulin , endotool calculated 5units/hr

## 2024-03-10 NOTE — ED Notes (Signed)
 Long acting insulin  pending pharmacy at this time

## 2024-03-10 NOTE — ED Notes (Signed)
 Assuming pt care, pt walked in for abd pain, n/v x 3 days prior to arrival, med hx. DM type 2, pt aaox4, mae, lethargic, skin warm/dry. BG 152. Pt resting in bed, call bell within reach.

## 2024-03-10 NOTE — ED Provider Notes (Signed)
 [place] EMERGENCY DEPARTMENT AT [facility] Provider Note   CSN: [id number] Arrival date & time: 03/10/24  1005     Patient presents with: Flank Pain   Joshua Escobar is a 53 y.o. male.  Patient with past history significant for diabetes presents to the emergency department concerns of flank pain.  States that he has been experiencing left-sided flank pain nausea, and vomiting for the last 2 days.  Denies any hematuria or dysuria.  No reported fevers.  Denies any prior history of kidney stones.  Does endorse that he has been having diarrhea as well that he describes as jellylike.  Denies any hematemesis, hematochezia, or melanotic stools.   Flank Pain       Prior to Admission medications   Medication Sig Start Date End Date Taking? Authorizing Provider  acetaminophen  (TYLENOL ) 325 MG tablet Take 2 tablets (650 mg total) by mouth every 6 (six) hours as needed for mild pain (pain score 1-3) or fever (or Fever >/= 101). 11/02/23   Danton Reyes DASEN, MD  aspirin  EC 81 MG tablet Take 1 tablet (81 mg total) by mouth daily. Swallow whole. 11/07/23   Patwardhan, Newman PARAS, MD  cholestyramine  (QUESTRAN ) 4 g packet Take 1 packet (4 g total) by mouth at bedtime. 07/03/23   Duanne Butler DASEN, MD  Continuous Glucose Sensor (FREESTYLE LIBRE 3 PLUS SENSOR) MISC Inject 1 each into the skin daily. 11/07/23   [provider]  insulin  lispro (HUMALOG  KWIKPEN) 100 UNIT/ML KwikPen Inject 8 Units into the skin 3 (three) times daily. 07/03/23   Duanne Butler DASEN, MD  LANTUS  SOLOSTAR 100 UNIT/ML Solostar Pen INJECT 30 UNITS INTO SKIN TWICE DAILY Patient taking differently: Inject 50 Units into the skin daily. 11/09/23   Duanne Butler DASEN, MD  meclizine  (ANTIVERT ) 25 MG tablet Take 1 tablet (25 mg total) by mouth 3 (three) times daily as needed for dizziness (vertigo). 10/05/23   Duanne Butler DASEN, MD  metoprolol  tartrate (LOPRESSOR ) 100 MG tablet Take 1 tablet (100 mg)  2 hours before Coronary  CT 11/07/23   Patwardhan, Newman PARAS, MD  ondansetron  (ZOFRAN ) 4 MG tablet Take 1 tablet (4 mg total) by mouth every 6 (six) hours as needed for up to 30 doses for nausea or vomiting. 10/27/23   Jerral Meth, MD  rosuvastatin  (CRESTOR ) 20 MG tablet Take 1 tablet (20 mg total) by mouth daily. 12/03/23   Duanne Butler DASEN, MD  VITAMIN D PO Take 1 tablet by mouth daily.    [provider]    Allergies: Patient has no known allergies.    Review of Systems  Genitourinary:  Positive for flank pain.  All other systems reviewed and are negative.   Updated Vital Signs BP (!) 136/97   Pulse 99   Temp 98.2 F (36.8 C)   Resp 19   Ht 5' 8 (1.727 m)   Wt 73.5 kg   SpO2 100%   BMI 24.63 kg/m   Physical Exam Vitals and nursing note reviewed.  Constitutional:      General: He is not in acute distress.    Appearance: He is well-developed. He is not ill-appearing.  HENT:     Head: Normocephalic and atraumatic.  Eyes:     Conjunctiva/sclera: Conjunctivae normal.  Cardiovascular:     Rate and Rhythm: Normal rate and regular rhythm.     Heart sounds: No murmur heard. Pulmonary:     Effort: Pulmonary effort is normal. No respiratory distress.  Breath sounds: Normal breath sounds. No wheezing or rales.  Abdominal:     General: Abdomen is flat. Bowel sounds are normal. There is no distension.     Palpations: Abdomen is soft.     Tenderness: There is no abdominal tenderness. There is left CVA tenderness. There is no right CVA tenderness or guarding.  Musculoskeletal:        General: No swelling.     Cervical back: Neck supple.  Skin:    General: Skin is warm and dry.     Capillary Refill: Capillary refill takes less than 2 seconds.  Neurological:     Mental Status: He is alert.  Psychiatric:        Mood and Affect: Mood normal.     (all labs ordered are listed, but only abnormal results are displayed) Labs Reviewed  COMPREHENSIVE METABOLIC PANEL WITH GFR - Abnormal;  Notable for the following components:      Result Value   CO2 19 (*)    Glucose, Bld 497 (*)    BUN 43 (*)    Creatinine, Ser 1.67 (*)    Total Bilirubin 1.3 (*)    GFR, Estimated 49 (*)    Anion gap 20 (*)    All other components within normal limits  CBC - Abnormal; Notable for the following components:   WBC 11.8 (*)    MCH 25.7 (*)    All other components within normal limits  URINALYSIS, ROUTINE W REFLEX MICROSCOPIC - Abnormal; Notable for the following components:   Glucose, UA >=500 (*)    Hgb urine dipstick SMALL (*)    Ketones, ur 20 (*)    Bacteria, UA RARE (*)    All other components within normal limits  BETA-HYDROXYBUTYRIC ACID - Abnormal; Notable for the following components:   Beta-Hydroxybutyric Acid 5.66 (*)    All other components within normal limits  BASIC METABOLIC PANEL WITH GFR - Abnormal; Notable for the following components:   Potassium 3.4 (*)    Glucose, Bld 339 (*)    BUN 40 (*)    Creatinine, Ser 1.63 (*)    Calcium  8.8 (*)    GFR, Estimated 50 (*)    All other components within normal limits  BASIC METABOLIC PANEL WITH GFR - Abnormal; Notable for the following components:   Glucose, Bld 165 (*)    BUN 37 (*)    Calcium  8.7 (*)    All other components within normal limits  BASIC METABOLIC PANEL WITH GFR - Abnormal; Notable for the following components:   Glucose, Bld 154 (*)    BUN 28 (*)    Calcium  8.7 (*)    All other components within normal limits  CBC - Abnormal; Notable for the following components:   WBC 11.2 (*)    MCH 25.5 (*)    All other components within normal limits  CBG MONITORING, ED - Abnormal; Notable for the following components:   Glucose-Capillary 473 (*)    All other components within normal limits  I-STAT VENOUS BLOOD GAS, ED - Abnormal; Notable for the following components:   pO2, Ven 59 (*)    All other components within normal limits  I-STAT CHEM 8, ED - Abnormal; Notable for the following components:   BUN 43  (*)    Creatinine, Ser 1.60 (*)    Glucose, Bld 507 (*)    All other components within normal limits  CBG MONITORING, ED - Abnormal; Notable for the following components:  Glucose-Capillary 419 (*)    All other components within normal limits  CBG MONITORING, ED - Abnormal; Notable for the following components:   Glucose-Capillary 365 (*)    All other components within normal limits  CBG MONITORING, ED - Abnormal; Notable for the following components:   Glucose-Capillary 243 (*)    All other components within normal limits  CBG MONITORING, ED - Abnormal; Notable for the following components:   Glucose-Capillary 211 (*)    All other components within normal limits  CBG MONITORING, ED - Abnormal; Notable for the following components:   Glucose-Capillary 162 (*)    All other components within normal limits  CBG MONITORING, ED - Abnormal; Notable for the following components:   Glucose-Capillary 225 (*)    All other components within normal limits  CBG MONITORING, ED - Abnormal; Notable for the following components:   Glucose-Capillary 152 (*)    All other components within normal limits  CBG MONITORING, ED - Abnormal; Notable for the following components:   Glucose-Capillary 123 (*)    All other components within normal limits  CBG MONITORING, ED - Abnormal; Notable for the following components:   Glucose-Capillary 167 (*)    All other components within normal limits  CBG MONITORING, ED - Abnormal; Notable for the following components:   Glucose-Capillary 215 (*)    All other components within normal limits  CBG MONITORING, ED - Abnormal; Notable for the following components:   Glucose-Capillary 240 (*)    All other components within normal limits  CBG MONITORING, ED - Abnormal; Notable for the following components:   Glucose-Capillary 183 (*)    All other components within normal limits  CBG MONITORING, ED - Abnormal; Notable for the following components:   Glucose-Capillary  151 (*)    All other components within normal limits  CBG MONITORING, ED - Abnormal; Notable for the following components:   Glucose-Capillary 109 (*)    All other components within normal limits  CBG MONITORING, ED - Abnormal; Notable for the following components:   Glucose-Capillary 162 (*)    All other components within normal limits  LIPASE, BLOOD  MAGNESIUM   BASIC METABOLIC PANEL WITH GFR  BASIC METABOLIC PANEL WITH GFR    EKG: None  Radiology: CT Renal Stone Study Result Date: 03/10/2024 CLINICAL DATA:  Flank pain. EXAM: CT ABDOMEN AND PELVIS WITHOUT CONTRAST TECHNIQUE: Multidetector CT imaging of the abdomen and pelvis was performed following the standard protocol without IV contrast. RADIATION DOSE REDUCTION: This exam was performed according to the departmental dose-optimization program which includes automated exposure control, adjustment of the mA and/or kV according to patient size and/or use of iterative reconstruction technique. COMPARISON:  Oct 26, 2023 FINDINGS: Lower chest: No acute abnormality. Hepatobiliary: No focal liver abnormality is seen. No gallstones, gallbladder wall thickening, or biliary dilatation. Pancreas: Unremarkable. No pancreatic ductal dilatation or surrounding inflammatory changes. Spleen: Normal in size without focal abnormality. Adrenals/Urinary Tract: Adrenal glands are unremarkable. Kidneys are normal in size, without obstructing renal calculi or focal lesions. There is mild right-sided hydronephrosis. The urinary bladder is markedly distended and is otherwise unremarkable. Stomach/Bowel: Stomach is within normal limits. Appendix appears normal. No evidence of bowel wall thickening, distention, or inflammatory changes. Vascular/Lymphatic: Aortic atherosclerosis. No enlarged abdominal or pelvic lymph nodes. Reproductive: Prostate is unremarkable. Other: No abdominal wall hernia or abnormality. No abdominopelvic ascites. Musculoskeletal: No acute or  significant osseous findings. IMPRESSION: 1. Mild right-sided hydronephrosis without evidence of obstructing renal calculi. 2. Markedly distended urinary  bladder. 3. Aortic atherosclerosis. Electronically Signed   By: Suzen Dials M.D.   On: 03/10/2024 12:04     Procedures   Medications Ordered in the ED  dextrose  50 % solution 0-50 mL (has no administration in time range)  aspirin  EC tablet 81 mg (81 mg Oral Given 03/10/24 1634)  sodium chloride  flush (NS) 0.9 % injection 3 mL (3 mLs Intravenous Not Given 03/10/24 2122)  sodium chloride  flush (NS) 0.9 % injection 3 mL (has no administration in time range)  0.9 %  sodium chloride  infusion (has no administration in time range)  acetaminophen  (TYLENOL ) tablet 650 mg (has no administration in time range)    Or  acetaminophen  (TYLENOL ) suppository 650 mg (has no administration in time range)  polyethylene glycol (MIRALAX  / GLYCOLAX ) packet 17 g (has no administration in time range)  ondansetron  (ZOFRAN ) tablet 4 mg (has no administration in time range)    Or  ondansetron  (ZOFRAN ) injection 4 mg (has no administration in time range)  hydrALAZINE  (APRESOLINE ) injection 5 mg (has no administration in time range)  heparin  injection 5,000 Units (5,000 Units Subcutaneous Given 03/11/24 0600)  insulin  glargine (LANTUS ) injection 30 Units (30 Units Subcutaneous Given 03/11/24 0027)  ondansetron  (ZOFRAN -ODT) disintegrating tablet 4 mg (4 mg Oral Given 03/10/24 1022)  morphine  (PF) 4 MG/ML injection 4 mg (4 mg Intravenous Given 03/10/24 1125)  lactated ringers  bolus 1,000 mL (0 mLs Intravenous Stopped 03/10/24 1228)  lactated ringers  bolus 1,000 mL (0 mLs Intravenous Stopped 03/10/24 1248)  potassium chloride  10 mEq in 100 mL IVPB (0 mEq Intravenous Stopped 03/10/24 2120)  potassium chloride  SA (KLOR-CON  M) CR tablet 40 mEq (40 mEq Oral Given 03/10/24 1634)    Clinical Course as of 03/11/24 0704  Mon Mar 10, 2024  1351 Spoke with Dr. Sonjia,  hospitalist, who will be admitting patient. [OZ]    Clinical Course User Index [OZ] Cecily Legrand LABOR, PA-C                                 Medical Decision Making Amount and/or Complexity of Data Reviewed Labs: ordered. Radiology: ordered.  Risk Prescription drug management. Decision regarding hospitalization.   This patient presents to the ED for concern of flank pain.  Differential diagnosis includes UTI, pyelonephritis, urolithiasis, bowel obstruction, diverticulitis, gastroenteritis   Lab Tests:  I Ordered, and personally interpreted labs.  The pertinent results include: CBC with slight leukocytosis at 11.2, CMP with evidence of AKI with creatinine up to 1.67 baseline 0.9.  GFR down to 49 with anion gap elevated at 20, lipase unremarkable at 25, UA no signs of infection but some ketonuria noted, CBG initially elevated at 497   Imaging Studies ordered:  I ordered imaging studies including CT renal stone study I independently visualized and interpreted imaging which showed negative for any acute findings but slight right-sided hydronephrosis I agree with the radiologist interpretation   Medicines ordered and prescription drug management:  I ordered medication including fluids, Zofran , morphine  for the dehydration, nausea, pain Reevaluation of the patient after these medicines showed that the patient improved I have reviewed the patients home medicines and have made adjustments as needed   Problem List / ED Course:  Patient with past history significant for type 2 diabetes presents emergency department with concerns of flank pain.  States that has been experiencing left-sided flank pain for the last 2 days with associated nausea, vomiting, diarrhea.  No  reported fevers.  Denies any sick contacts.  No history of kidney stones. Exam reveals an uncomfortable appearing 53 year old male with notable left CVA tenderness.  Right CVA unremarkable and no abdominal tenderness.  Normal  bowel sounds. Will proceed with renal evaluation for possible stone given extent of pain and symptoms.  Symptomatic treatment initiated with fluids, morphine , and Zofran .  Will reevaluate shortly. Patient with mild improvement in symptoms.  Mentation appears to be remaining stable but he is somewhat sleepy.  Patient has previously had episodes of DKA and although he is not acidotic, I am concerned he may be approaching this territory as he been out of his medication for the last several days due to his vomiting.  Will consult hospitalist for admission.  No obvious signs of urologic concern at this time to necessitate urology consultation. Spoke with Dr. Sonjia, hospitalist, who will be admitting patient.   Social Determinants of Health:  None  Final diagnoses:  AKI (acute kidney injury)  Type 2 diabetes mellitus with hyperglycemia, with long-term current use of insulin  [facility])    ED Discharge Orders     None          Cecily Legrand LABOR, PA-C 03/11/24 0704    Gennaro Duwaine CROME, DO 03/11/24 1121

## 2024-03-10 NOTE — ED Notes (Signed)
Unable to provide urine

## 2024-03-10 NOTE — ED Notes (Signed)
 BG 123, private message sent to admitting MD, per admitting MD orders to change from insulin  drip to insulin  advise night team, insulin  drip stopped at this time

## 2024-03-10 NOTE — ED Triage Notes (Signed)
 Pt c/o constant left flank pain, nausea and vomiting x 2 days.

## 2024-03-11 ENCOUNTER — Encounter (HOSPITAL_COMMUNITY)
Admission: EM | Disposition: A | Discharge status: Home / Self Care | Payer: Self-pay | Source: Home / Self Care | Attending: Hospitalist

## 2024-03-11 ENCOUNTER — Observation Stay (HOSPITAL_COMMUNITY)

## 2024-03-11 DIAGNOSIS — R9431 Abnormal electrocardiogram [ECG] [EKG]: Secondary | ICD-10-CM

## 2024-03-11 DIAGNOSIS — R079 Chest pain, unspecified: Secondary | ICD-10-CM | POA: Diagnosis not present

## 2024-03-11 DIAGNOSIS — R072 Precordial pain: Secondary | ICD-10-CM

## 2024-03-11 DIAGNOSIS — E1110 Type 2 diabetes mellitus with ketoacidosis without coma: Secondary | ICD-10-CM | POA: Diagnosis not present

## 2024-03-11 DIAGNOSIS — I10 Essential (primary) hypertension: Secondary | ICD-10-CM

## 2024-03-11 LAB — CBC
HCT: 42.5 % (ref 39.0–52.0)
Hemoglobin: 13.4 g/dL (ref 13.0–17.0)
MCH: 25.5 pg — ABNORMAL LOW (ref 26.0–34.0)
MCHC: 31.5 g/dL (ref 30.0–36.0)
MCV: 80.8 fL (ref 80.0–100.0)
Platelets: 190 K/uL (ref 150–400)
RBC: 5.26 MIL/uL (ref 4.22–5.81)
RDW: 13.8 % (ref 11.5–15.5)
WBC: 11.2 K/uL — ABNORMAL HIGH (ref 4.0–10.5)
nRBC: 0 % (ref 0.0–0.2)

## 2024-03-11 LAB — CBG MONITORING, ED
Glucose-Capillary: 109 mg/dL — ABNORMAL HIGH (ref 70–99)
Glucose-Capillary: 151 mg/dL — ABNORMAL HIGH (ref 70–99)
Glucose-Capillary: 162 mg/dL — ABNORMAL HIGH (ref 70–99)
Glucose-Capillary: 171 mg/dL — ABNORMAL HIGH (ref 70–99)
Glucose-Capillary: 183 mg/dL — ABNORMAL HIGH (ref 70–99)
Glucose-Capillary: 239 mg/dL — ABNORMAL HIGH (ref 70–99)
Glucose-Capillary: 240 mg/dL — ABNORMAL HIGH (ref 70–99)

## 2024-03-11 LAB — HEPARIN LEVEL (UNFRACTIONATED): Heparin Unfractionated: 0.1 [IU]/mL — ABNORMAL LOW (ref 0.30–0.70)

## 2024-03-11 LAB — GLUCOSE, CAPILLARY
Glucose-Capillary: 118 mg/dL — ABNORMAL HIGH (ref 70–99)
Glucose-Capillary: 131 mg/dL — ABNORMAL HIGH (ref 70–99)

## 2024-03-11 LAB — ECHOCARDIOGRAM COMPLETE
AR max vel: 2.35 cm2
AV Area VTI: 2.23 cm2
AV Area mean vel: 2.38 cm2
AV Mean grad: 5 mmHg
AV Peak grad: 9.9 mmHg
Ao pk vel: 1.57 m/s
Area-P 1/2: 4.96 cm2
Calc EF: 65.5 %
Height: 68 in
S' Lateral: 2.4 cm
Single Plane A2C EF: 67.8 %
Single Plane A4C EF: 63.6 %
Weight: 2592 [oz_av]

## 2024-03-11 LAB — BASIC METABOLIC PANEL WITH GFR
Anion gap: 10 (ref 5–15)
BUN: 28 mg/dL — ABNORMAL HIGH (ref 6–20)
CO2: 25 mmol/L (ref 22–32)
Calcium: 8.7 mg/dL — ABNORMAL LOW (ref 8.9–10.3)
Chloride: 105 mmol/L (ref 98–111)
Creatinine, Ser: 1.04 mg/dL (ref 0.61–1.24)
GFR, Estimated: >60 mL/min (ref >60)
Glucose, Bld: 154 mg/dL — ABNORMAL HIGH (ref 70–99)
Potassium: 4 mmol/L (ref 3.5–5.1)
Sodium: 140 mmol/L (ref 135–145)

## 2024-03-11 LAB — HEMOGLOBIN A1C
Hgb A1c MFr Bld: 12.7 % — ABNORMAL HIGH (ref 4.8–5.6)
Mean Plasma Glucose: 317.79 mg/dL

## 2024-03-11 LAB — MAGNESIUM: Magnesium: 2.4 mg/dL (ref 1.7–2.4)

## 2024-03-11 LAB — TROPONIN I (HIGH SENSITIVITY)
Troponin I (High Sensitivity): 5 ng/L (ref <18)
Troponin I (High Sensitivity): 5 ng/L (ref <18)

## 2024-03-11 SURGERY — LEFT HEART CATH AND CORONARY ANGIOGRAPHY
Anesthesia: LOCAL

## 2024-03-11 MED ORDER — VERAPAMIL HCL 2.5 MG/ML IV SOLN
INTRAVENOUS | Status: AC
  Filled 2024-03-11: qty 2

## 2024-03-11 MED ORDER — LIDOCAINE HCL (PF) 1 % IJ SOLN
INTRAMUSCULAR | Status: AC
  Filled 2024-03-11: qty 30

## 2024-03-11 MED ORDER — SODIUM CHLORIDE 0.9 % IV SOLN: 250.0000 mL | INTRAVENOUS | Status: DC | PRN

## 2024-03-11 MED ORDER — SODIUM CHLORIDE 0.9 % IV SOLN: 250.0000 mL | INTRAVENOUS | Status: AC | PRN

## 2024-03-11 MED ORDER — LIDOCAINE HCL (PF) 1 % IJ SOLN
INTRAMUSCULAR | Status: DC | PRN
  Administered 2024-03-11: 2 mL via INTRADERMAL

## 2024-03-11 MED ORDER — SODIUM CHLORIDE 0.9% FLUSH: 3.0000 mL | INTRAVENOUS | Status: DC | PRN

## 2024-03-11 MED ORDER — FENTANYL CITRATE (PF) 100 MCG/2ML IJ SOLN
INTRAMUSCULAR | Status: DC | PRN
  Administered 2024-03-11: 50 ug via INTRAVENOUS

## 2024-03-11 MED ORDER — HEPARIN (PORCINE) 25000 UT/250ML-% IV SOLN
1150.0000 [IU]/h | INTRAVENOUS | Status: DC
  Administered 2024-03-11: 900 [IU]/h via INTRAVENOUS
  Filled 2024-03-11: qty 250

## 2024-03-11 MED ORDER — FREE WATER
500.0000 mL | Freq: Once | Status: AC
  Administered 2024-03-11: 500 mL via ORAL

## 2024-03-11 MED ORDER — ACETAMINOPHEN 325 MG PO TABS: 650.0000 mg | ORAL_TABLET | ORAL | Status: DC | PRN

## 2024-03-11 MED ORDER — IOHEXOL 350 MG/ML SOLN
INTRAVENOUS | Status: DC | PRN
  Administered 2024-03-11: 15 mL

## 2024-03-11 MED ORDER — MIDAZOLAM HCL 2 MG/2ML IJ SOLN
INTRAMUSCULAR | Status: AC
  Filled 2024-03-11: qty 2

## 2024-03-11 MED ORDER — HEPARIN (PORCINE) IN NACL 1000-0.9 UT/500ML-% IV SOLN
INTRAVENOUS | Status: DC | PRN
  Administered 2024-03-11 (×2): 500 mL

## 2024-03-11 MED ORDER — MIDAZOLAM HCL 2 MG/2ML IJ SOLN
INTRAMUSCULAR | Status: DC | PRN
  Administered 2024-03-11: 1 mg via INTRAVENOUS

## 2024-03-11 MED ORDER — HEPARIN BOLUS VIA INFUSION
2000.0000 [IU] | Freq: Once | INTRAVENOUS | Status: DC
  Filled 2024-03-11: qty 2000

## 2024-03-11 MED ORDER — SODIUM CHLORIDE 0.9% FLUSH
3.0000 mL | Freq: Two times a day (BID) | INTRAVENOUS | Status: DC
  Administered 2024-03-11 – 2024-03-13 (×5): 3 mL via INTRAVENOUS
  Administered 2024-03-14: 10 mL via INTRAVENOUS

## 2024-03-11 MED ORDER — NITROGLYCERIN 0.4 MG SL SUBL
0.4000 mg | SUBLINGUAL_TABLET | SUBLINGUAL | Status: DC | PRN
  Administered 2024-03-11 (×2): 0.4 mg via SUBLINGUAL
  Filled 2024-03-11: qty 1

## 2024-03-11 MED ORDER — SODIUM CHLORIDE 0.9% FLUSH
3.0000 mL | Freq: Two times a day (BID) | INTRAVENOUS | Status: DC
  Administered 2024-03-11: 3 mL via INTRAVENOUS

## 2024-03-11 MED ORDER — PANTOPRAZOLE SODIUM 40 MG IV SOLR
40.0000 mg | INTRAVENOUS | Status: DC
  Administered 2024-03-11: 40 mg via INTRAVENOUS
  Filled 2024-03-11: qty 10

## 2024-03-11 MED ORDER — INSULIN ASPART 100 UNIT/ML IJ SOLN
0.0000 [IU] | INTRAMUSCULAR | Status: DC
  Administered 2024-03-11: 1 [IU] via SUBCUTANEOUS
  Administered 2024-03-11: 3 [IU] via SUBCUTANEOUS
  Administered 2024-03-13: 1 [IU] via SUBCUTANEOUS
  Administered 2024-03-13: 3 [IU] via SUBCUTANEOUS
  Administered 2024-03-13: 1 [IU] via SUBCUTANEOUS

## 2024-03-11 MED ORDER — ASPIRIN 81 MG PO CHEW: 81.0000 mg | CHEWABLE_TABLET | ORAL | Status: DC

## 2024-03-11 MED ORDER — FENTANYL CITRATE (PF) 100 MCG/2ML IJ SOLN
INTRAMUSCULAR | Status: AC
  Filled 2024-03-11: qty 2

## 2024-03-11 MED ORDER — HYDRALAZINE HCL 20 MG/ML IJ SOLN: 10.0000 mg | INTRAMUSCULAR | Status: AC | PRN

## 2024-03-11 MED ORDER — ASPIRIN 81 MG PO CHEW
324.0000 mg | CHEWABLE_TABLET | Freq: Once | ORAL | Status: AC
  Administered 2024-03-11: 324 mg via ORAL
  Filled 2024-03-11: qty 4

## 2024-03-11 MED ORDER — HEPARIN SODIUM (PORCINE) 1000 UNIT/ML IJ SOLN
INTRAMUSCULAR | Status: DC | PRN
  Administered 2024-03-11: 3500 [IU] via INTRAVENOUS

## 2024-03-11 SURGICAL SUPPLY — 6 items
CATH INFINITI AMBI 5FR TG (CATHETERS) IMPLANT
DEVICE RAD COMP TR BAND LRG (VASCULAR PRODUCTS) IMPLANT
GLIDESHEATH SLEND A-KIT 6F 22G (SHEATH) IMPLANT
GUIDEWIRE INQWIRE 1.5J.035X260 (WIRE) IMPLANT
PACK CARDIAC CATHETERIZATION (CUSTOM PROCEDURE TRAY) ×2 IMPLANT
SET ATX-X65L (MISCELLANEOUS) IMPLANT

## 2024-03-11 NOTE — ED Notes (Addendum)
 BG 109, Insulin  drip paused, private message sent to admitting team MD

## 2024-03-11 NOTE — H&P (View-Only) (Signed)
 Cardiology Consultation:  Patient ID: Joshua Escobar MRN: [id number]; DOB: 11/19/1970 Admit date: 03/10/2024 Date of Consult: 03/11/2024  Primary Care Provider: Duanne Butler DASEN, MD Primary Cardiologist: Newman JINNY Lawrence, MD  Primary Electrophysiologist:  None  Patient Profile:  Joshua Escobar is a 53 y.o. male with a hx of uncontrolled DM, recurrent pancreatitis who is being seen today for the evaluation of chest pain at the request of Dr. Donnamarie.  History of Present Illness:  Joshua Escobar is a 52 year old male with history of diabetes and recurrent pancreatitis who is presenting with nausea, vomiting, and abdominal pain.  She reports that 4 days ago, started having worsening GI symptoms and unable to tolerate p.o. intake.  Since then, he has not taking his insulin .  He denies any fever or chills.  He also denied chest pain during this time.  However, this morning, he started having severe 10 out of 10 left-sided chest pain that felt like tightness.  He denies any associated dyspnea.  He also denies diaphoresis.  He received nitro which improved his chest pain from 10 to about a 7.  He reported having a similar pain about 7 months ago and was seen in clinic.  At that time a coronary CTA was ordered that showed nonobstructive CAD.  He had not had symptoms since then.  He was ultimately admitted with DKA and was found to have an EKG that was concerning for ST elevation myocardial infarction.  Past Medical History: DM  Past Surgical History: None  Allergies:    No Known Allergies  Social History: 6-pack tobacco use  Family History:   Noncontributory  ROS:  All other ROS reviewed and negative. Pertinent positives noted in the HPI.     Physical Exam/Data:   Vitals:   03/11/24 0017 03/11/24 0400 03/11/24 0600 03/11/24 0748  BP: (!) 143/95 102/82 (!) 136/97 (!) 153/90  Pulse: 93 87 99 92  Resp: 14 16 19 16   Temp: 98.3 F (36.8 C) 98.2 F (36.8 C)  98 F (36.7 C)  TempSrc: Oral    Oral  SpO2: 97% 100% 100% 100%  Weight:      Height:        Intake/Output Summary (Last 24 hours) at 03/11/2024 0946 Last data filed at 03/10/2024 1347 Gross per 24 hour  Intake 6.76 ml  Output --  Net 6.76 ml       03/10/2024   10:16 AM 01/15/2024   11:23 AM 11/07/2023    1:41 PM  Last 3 Weights  Weight (lbs) 162 lb 162 lb 159 lb  Weight (kg) 73.483 kg 73.483 kg 72.122 kg    Body mass index is 24.63 kg/m.  General: Well nourished, well developed, in no acute distress HEENT: Atraumatic, no JVD Cardiac: Normal S1, S2; RRR; no murmurs, rubs, or gallops Lungs:CTAB, no wheezing, rhonchi or rales  Abd: Soft, nontender, no hepatomegaly  Ext: No edema, pulses 2+ Skin: Warm and dry, no rashes     Relevant CV Studies: Trop/BNP: 5 TTE 10/25: 60-65% LHC/RHC 2018: No CAD Coronary CTA 7/25: Non-obstructive CAD Assessment and Plan:  Joshua Escobar is a 53 y.o. male with a hx of uncontrolled DM, recurrent pancreatitis who is being seen today for the evaluation of chest pain at the request of Dr. Donnamarie.  #CP w/ ECG changes #DKA #Known non-obstructive CAD - Presenting with DKA and subsequently found to have severe left-sided chest pain.  EKG with some dynamic changes, however unequivocal for STEMI.  Initial  troponin at 5 and patient chest pain seems to have improved significantly with the nitro.  I discussed the case with the Cath Lab and the plan is to try to manage his pain medically and plan for angiogram at some point today.  TTE with normal EF and no wall motion abnormalities reassuring.  I suspect his chest pain could be demand in the setting of his DKA. - Continue aspirin , heparin  drip, rosuvastatin  20 mg - Will trend troponin - Currently n.p.o. and plan for left heart cath later today  Informed Consent   Shared Decision Making/Informed Consent The risks [stroke (1 in 1000), death (1 in 1000), kidney failure [usually temporary] (1 in 500), bleeding (1 in 200), allergic  reaction [possibly serious] (1 in 200)], benefits (diagnostic support and management of coronary artery disease) and alternatives of a cardiac catheterization were discussed in detail with Joshua Escobar and he is willing to proceed.     For questions or updates, please contact [facility] Please consult [email or web address] for contact info under    Signed, Joelle DEL. Ren Ny, MD, [facility]  [facility]  03/11/2024 9:46 AM

## 2024-03-11 NOTE — Progress Notes (Signed)
   03/11/24 1054  TOC Brief Assessment  Insurance and Status Reviewed  Patient has primary care physician Yes  Home environment has been reviewed From home  Prior level of function: Independent  Prior/Current Home Services No current home services  Social Drivers of Health Review SDOH reviewed no interventions necessary  Readmission risk has been reviewed Yes  Transition of care needs no transition of care needs at this time   Transition of Care Department [facility]) has reviewed patient and no other TOC needs have been identified at this time. We will continue to monitor patient advancement through interdisciplinary progression rounds. If new patient needs arise, please place a TOC consult.

## 2024-03-11 NOTE — ED Notes (Signed)
 Insulin  drip stopped per admitting md orders

## 2024-03-11 NOTE — Consult Note (Signed)
 Cardiology Consultation:  Patient ID: Joshua Escobar MRN: [id number]; DOB: 11/19/1970 Admit date: 03/10/2024 Date of Consult: 03/11/2024  Primary Care Provider: Duanne Butler DASEN, MD Primary Cardiologist: Newman JINNY Lawrence, MD  Primary Electrophysiologist:  None  Patient Profile:  Joshua Escobar is a 53 y.o. male with a hx of uncontrolled DM, recurrent pancreatitis who is being seen today for the evaluation of chest pain at the request of Dr. Donnamarie.  History of Present Illness:  Joshua Escobar is a 52 year old male with history of diabetes and recurrent pancreatitis who is presenting with nausea, vomiting, and abdominal pain.  She reports that 4 days ago, started having worsening GI symptoms and unable to tolerate p.o. intake.  Since then, he has not taking his insulin .  He denies any fever or chills.  He also denied chest pain during this time.  However, this morning, he started having severe 10 out of 10 left-sided chest pain that felt like tightness.  He denies any associated dyspnea.  He also denies diaphoresis.  He received nitro which improved his chest pain from 10 to about a 7.  He reported having a similar pain about 7 months ago and was seen in clinic.  At that time a coronary CTA was ordered that showed nonobstructive CAD.  He had not had symptoms since then.  He was ultimately admitted with DKA and was found to have an EKG that was concerning for ST elevation myocardial infarction.  Past Medical History: DM  Past Surgical History: None  Allergies:    No Known Allergies  Social History: 6-pack tobacco use  Family History:   Noncontributory  ROS:  All other ROS reviewed and negative. Pertinent positives noted in the HPI.     Physical Exam/Data:   Vitals:   03/11/24 0017 03/11/24 0400 03/11/24 0600 03/11/24 0748  BP: (!) 143/95 102/82 (!) 136/97 (!) 153/90  Pulse: 93 87 99 92  Resp: 14 16 19 16   Temp: 98.3 F (36.8 C) 98.2 F (36.8 C)  98 F (36.7 C)  TempSrc: Oral    Oral  SpO2: 97% 100% 100% 100%  Weight:      Height:        Intake/Output Summary (Last 24 hours) at 03/11/2024 0946 Last data filed at 03/10/2024 1347 Gross per 24 hour  Intake 6.76 ml  Output --  Net 6.76 ml       03/10/2024   10:16 AM 01/15/2024   11:23 AM 11/07/2023    1:41 PM  Last 3 Weights  Weight (lbs) 162 lb 162 lb 159 lb  Weight (kg) 73.483 kg 73.483 kg 72.122 kg    Body mass index is 24.63 kg/m.  General: Well nourished, well developed, in no acute distress HEENT: Atraumatic, no JVD Cardiac: Normal S1, S2; RRR; no murmurs, rubs, or gallops Lungs:CTAB, no wheezing, rhonchi or rales  Abd: Soft, nontender, no hepatomegaly  Ext: No edema, pulses 2+ Skin: Warm and dry, no rashes     Relevant CV Studies: Trop/BNP: 5 TTE 10/25: 60-65% LHC/RHC 2018: No CAD Coronary CTA 7/25: Non-obstructive CAD Assessment and Plan:  Joshua Escobar is a 53 y.o. male with a hx of uncontrolled DM, recurrent pancreatitis who is being seen today for the evaluation of chest pain at the request of Dr. Donnamarie.  #CP w/ ECG changes #DKA #Known non-obstructive CAD - Presenting with DKA and subsequently found to have severe left-sided chest pain.  EKG with some dynamic changes, however unequivocal for STEMI.  Initial  troponin at 5 and patient chest pain seems to have improved significantly with the nitro.  I discussed the case with the Cath Lab and the plan is to try to manage his pain medically and plan for angiogram at some point today.  TTE with normal EF and no wall motion abnormalities reassuring.  I suspect his chest pain could be demand in the setting of his DKA. - Continue aspirin , heparin  drip, rosuvastatin  20 mg - Will trend troponin - Currently n.p.o. and plan for left heart cath later today  Informed Consent   Shared Decision Making/Informed Consent The risks [stroke (1 in 1000), death (1 in 1000), kidney failure [usually temporary] (1 in 500), bleeding (1 in 200), allergic  reaction [possibly serious] (1 in 200)], benefits (diagnostic support and management of coronary artery disease) and alternatives of a cardiac catheterization were discussed in detail with Joshua Escobar and he is willing to proceed.     For questions or updates, please contact [facility] Please consult [email or web address] for contact info under    Signed, Joelle DEL. Ren Ny, MD, [facility]  [facility]  03/11/2024 9:46 AM

## 2024-03-11 NOTE — Progress Notes (Signed)
 PROGRESS NOTE  [name] FMW:[id number] DOB: 02-10-1971 DOA: 03/10/2024 PCP: Duanne Butler DASEN, MD  HPI/Recap of past 24 hours: Joshua Escobar is a 53 y.o. male with past medical history of type 2 diabetes with left-sided flank pain, nausea and vomiting for the last 2 to 3 days PTA, with some mild urgency and frequency.  Very noncompliant with his insulin  regimen.  In the ED, vital signs fairly stable. Labs were notable for i-STAT glucose of 500, creatinine elevated at 1.6.  Anion gap of 20, with noted acidosis, bilirubin slightly elevated 1.3, Beta hydroxybutyrate was elevated. CT renal study showed mild right-sided hydronephrosis without any renal calculi with distended urinary bladder.  UA negative for infection.  Patient admitted for further management.    Early this a.m., patient noted to have significant 10/10 left-sided chest pain, of which he describes as squeezing, with associated nausea but denies any vomiting.  Patient does report some generalized abdominal tenderness.  Cardiology consulted for concerning EKG changes.    Assessment/Plan: Active Problems:   DKA (diabetic ketoacidosis) (HCC)   DKA Diabetes mellitus type 2, uncontrolled with hyperglycemia Patient noncompliant Last A1c 12.7 S/p insulin  drip, switched to SSI, Lantus , Accu-Cheks, hypoglycemic protocol Monitor closely  Left-sided chest pain History of nonobstructive CAD HLD Possible cardiac, in the setting of uncontrolled DM Troponin 5-->5, flat trend EKG showed ST changes concerning for anterior septal infarct Echo showed EF of 60 to 65%, no regional wall motion abnormality Start ASA, nitroglycerin  SL, statins, heparin  drip Cardiology consulted, recommended n.p.o. and possible left heart cath later on today Telemetry  Generalized abdominal pain Associated with nausea/vomiting Afebrile, with mild leukocytosis LFTs WNL except for mildly elevated bilirubin, lipase WNL UA positive for glucose  greater than 500, ketones, otherwise unremarkable Chest x-ray pending CT renal stone showed mild right-sided hydronephrosis without nephrolithiasis, distended urinary bladder Bladder scan to rule out retention  Alcohol abuse Advised to quit  Tobacco abuse Advised to quit    Estimated body mass index is 24.63 kg/m as calculated from the following:   Height as of this encounter: 5' 8 (1.727 m).   Weight as of this encounter: 73.5 kg.     Code Status: Full  Family Communication: None at bedside  Disposition Plan: Status is: Observation The patient will require care spanning > 2 midnights and should be moved to inpatient because: Level of care      Consultants: Cardiology  Procedures: None  Antimicrobials: None  DVT prophylaxis: IV heparin    Objective: Vitals:   03/11/24 0017 03/11/24 0400 03/11/24 0600 03/11/24 0748  BP: (!) 143/95 102/82 (!) 136/97 (!) 153/90  Pulse: 93 87 99 92  Resp: 14 16 19 16   Temp: 98.3 F (36.8 C) 98.2 F (36.8 C)  98 F (36.7 C)  TempSrc: Oral   Oral  SpO2: 97% 100% 100% 100%  Weight:      Height:        Intake/Output Summary (Last 24 hours) at 03/11/2024 1120 Last data filed at 03/10/2024 1347 Gross per 24 hour  Intake 6.76 ml  Output --  Net 6.76 ml   Filed Weights   03/10/24 1016  Weight: 73.5 kg    Exam: General: NAD, acutely ill-appearing Cardiovascular: S1, S2 present Respiratory: CTAB Abdomen: Soft, tender, nondistended, bowel sounds present Musculoskeletal: No bilateral pedal edema noted Skin: Normal Psychiatry: Fair mood     Data Reviewed: CBC: Recent Labs  Lab 03/10/24 1018 03/10/24 1201 03/11/24 0420  WBC 11.8*  --  11.2*  HGB 14.2 14.6  15.0 13.4  HCT 44.7 43.0  44.0 42.5  MCV 81.0  --  80.8  PLT 239  --  190   Basic Metabolic Panel: Recent Labs  Lab 03/10/24 1018 03/10/24 1201 03/10/24 1331 03/10/24 1843 03/11/24 0304 03/11/24 0420  NA 138 138  139 136 139 140  --   K 3.8  3.9  4.0 3.4* 4.8 4.0  --   CL 99 100 104 105 105  --   CO2 19*  --  22 26 25   --   GLUCOSE 497* 507* 339* 165* 154*  --   BUN 43* 43* 40* 37* 28*  --   CREATININE 1.67* 1.60* 1.63* 1.19 1.04  --   CALCIUM  9.0  --  8.8* 8.7* 8.7*  --   MG  --   --   --   --   --  2.4   GFR: Estimated Creatinine Clearance: 80.4 mL/min (by C-G formula based on SCr of 1.04 mg/dL). Liver Function Tests: Recent Labs  Lab 03/10/24 1018  AST 19  ALT 21  ALKPHOS 108  BILITOT 1.3*  PROT 7.6  ALBUMIN 3.9   Recent Labs  Lab 03/10/24 1018  LIPASE 25   No results for input(s): AMMONIA in the last 168 hours. Coagulation Profile: No results for input(s): INR, PROTIME in the last 168 hours. Cardiac Enzymes: No results for input(s): CKTOTAL, CKMB, CKMBINDEX, TROPONINI in the last 168 hours. BNP (last 3 results) No results for input(s): PROBNP in the last 8760 hours. HbA1C: Recent Labs    03/11/24 0805  HGBA1C 12.7*   CBG: Recent Labs  Lab 03/11/24 0153 03/11/24 0302 03/11/24 0407 03/11/24 0702 03/11/24 0823  GLUCAP 183* 151* 109* 162* 171*   Lipid Profile: No results for input(s): CHOL, HDL, LDLCALC, TRIG, CHOLHDL, LDLDIRECT in the last 72 hours. Thyroid  Function Tests: No results for input(s): TSH, T4TOTAL, FREET4, T3FREE, THYROIDAB in the last 72 hours. Anemia Panel: No results for input(s): VITAMINB12, FOLATE, FERRITIN, TIBC, IRON, RETICCTPCT in the last 72 hours. Urine analysis:    Component Value Date/Time   COLORURINE YELLOW 03/10/2024 1052   APPEARANCEUR CLEAR 03/10/2024 1052   LABSPEC 1.026 03/10/2024 1052   PHURINE 5.0 03/10/2024 1052   GLUCOSEU >=500 (A) 03/10/2024 1052   HGBUR SMALL (A) 03/10/2024 1052   BILIRUBINUR NEGATIVE 03/10/2024 1052   KETONESUR 20 (A) 03/10/2024 1052   PROTEINUR NEGATIVE 03/10/2024 1052   UROBILINOGEN 0.2 06/10/2017 1607   NITRITE NEGATIVE 03/10/2024 1052   LEUKOCYTESUR NEGATIVE 03/10/2024 1052    Sepsis Labs: @LABRCNTIP (procalcitonin:4,lacticidven:4)  )No results found for this or any previous visit (from the past 240 hours).    Studies: ECHOCARDIOGRAM COMPLETE Result Date: 03/11/2024    ECHOCARDIOGRAM REPORT   Patient Name:   Joshua Escobar Date of Exam: 03/11/2024 Medical Rec #:  [id number]       Height:       68.0 in Accession #:    [id number]      Weight:       162.0 lb Date of Birth:  06/06/1970       BSA:          1.869 m Patient Age:    53 years        BP:           153/90 mmHg Patient Gender: M               HR:  98 bpm. Exam Location:  Inpatient Procedure: 2D Echo and Strain Analysis (Both Spectral and Color Flow Doppler            were utilized during procedure). Indications:    Acute Myocardial Infarction  History:        Patient has prior history of Echocardiogram examinations. Risk                 Factors:Hypertension.  Sonographer:    Charmaine Gaskins Referring Phys: [id number] Jaycen Vercher J Artin Mceuen IMPRESSIONS  1. Left ventricular ejection fraction, by estimation, is 60 to 65%. Left ventricular ejection fraction by 2D MOD biplane is 65.5 %. The left ventricle has normal function. The left ventricle has no regional wall motion abnormalities. There is mild concentric left ventricular hypertrophy. Left ventricular diastolic parameters were normal. The average left ventricular global longitudinal strain is -21.0 %. The global longitudinal strain is normal.  2. Right ventricular systolic function is normal. The right ventricular size is normal. Tricuspid regurgitation signal is inadequate for assessing PA pressure.  3. The mitral valve is grossly normal. Trivial mitral valve regurgitation. No evidence of mitral stenosis.  4. The aortic valve is tricuspid. Aortic valve regurgitation is not visualized. No aortic stenosis is present.  5. The inferior vena cava is normal in size with greater than 50% respiratory variability, suggesting right atrial pressure of 3 mmHg. FINDINGS  Left  Ventricle: Left ventricular ejection fraction, by estimation, is 60 to 65%. Left ventricular ejection fraction by 2D MOD biplane is 65.5 %. The left ventricle has normal function. The left ventricle has no regional wall motion abnormalities. The average left ventricular global longitudinal strain is -21.0 %. Strain was performed and the global longitudinal strain is normal. The left ventricular internal cavity size was normal in size. There is mild concentric left ventricular hypertrophy. Left ventricular diastolic parameters were normal. Right Ventricle: The right ventricular size is normal. No increase in right ventricular wall thickness. Right ventricular systolic function is normal. Tricuspid regurgitation signal is inadequate for assessing PA pressure. Left Atrium: Left atrial size was normal in size. Right Atrium: Right atrial size was normal in size. Pericardium: There is no evidence of pericardial effusion. Mitral Valve: The mitral valve is grossly normal. Trivial mitral valve regurgitation. No evidence of mitral valve stenosis. Tricuspid Valve: The tricuspid valve is grossly normal. Tricuspid valve regurgitation is trivial. No evidence of tricuspid stenosis. Aortic Valve: The aortic valve is tricuspid. Aortic valve regurgitation is not visualized. No aortic stenosis is present. Aortic valve mean gradient measures 5.0 mmHg. Aortic valve peak gradient measures 9.9 mmHg. Aortic valve area, by VTI measures 2.23 cm. Pulmonic Valve: The pulmonic valve was grossly normal. Pulmonic valve regurgitation is trivial. No evidence of pulmonic stenosis. Aorta: The aortic root and ascending aorta are structurally normal, with no evidence of dilitation. Venous: The inferior vena cava is normal in size with greater than 50% respiratory variability, suggesting right atrial pressure of 3 mmHg. IAS/Shunts: The atrial septum is grossly normal.  LEFT VENTRICLE PLAX 2D                        Biplane EF (MOD) LVIDd:         4.07  cm         LV Biplane EF:   Left LVIDs:         2.40 cm  ventricular LV PW:         1.15 cm                          ejection LV IVS:        1.10 cm                          fraction by LVOT diam:     2.05 cm                          2D MOD LV SV:         56                               biplane is LV SV Index:   30                               65.5 %. LVOT Area:     3.30 cm                                Diastology                                LV e' medial:    9.36 cm/s LV Volumes (MOD)               LV E/e' medial:  7.2 LV vol d, MOD    47.5 ml       LV e' lateral:   9.79 cm/s A2C:                           LV E/e' lateral: 6.9 LV vol d, MOD    51.4 ml A4C:                           2D Longitudinal LV vol s, MOD    15.3 ml       Strain A2C:                           2D Strain GLS   -21.0 % LV vol s, MOD    18.7 ml       Avg: A4C: LV SV MOD A2C:   32.2 ml LV SV MOD A4C:   51.4 ml LV SV MOD BP:    34.3 ml RIGHT VENTRICLE RV Basal diam:  2.50 cm RV Mid diam:    2.24 cm RV S prime:     14.30 cm/s LEFT ATRIUM             Index        RIGHT ATRIUM          Index LA diam:        2.92 cm 1.56 cm/m   RA Area:     9.02 cm LA Vol (A2C):   33.5 ml 17.93 ml/m  RA Volume:   15.10 ml 8.08 ml/m LA Vol (A4C):   36.5 ml 19.53 ml/m LA Biplane Vol: 34.9 ml 18.68 ml/m  AORTIC VALVE AV Area (Vmax):  2.35 cm AV Area (Vmean):   2.38 cm AV Area (VTI):     2.23 cm AV Vmax:           157.00 cm/s AV Vmean:          107.000 cm/s AV VTI:            0.252 m AV Peak Grad:      9.9 mmHg AV Mean Grad:      5.0 mmHg LVOT Vmax:         112.00 cm/s LVOT Vmean:        77.000 cm/s LVOT VTI:          0.170 m LVOT/AV VTI ratio: 0.67  AORTA Ao Root diam: 3.34 cm Ao Asc diam:  3.13 cm MITRAL VALVE MV Area (PHT): 4.96 cm    SHUNTS MV Decel Time: 153 msec    Systemic VTI:  0.17 m MV E velocity: 67.60 cm/s  Systemic Diam: 2.05 cm MV A velocity: 89.10 cm/s MV E/A ratio:  0.76 Darryle Decent MD Electronically signed by  Darryle Decent MD Signature Date/Time: 03/11/2024/9:20:19 AM    Final    CT Renal Stone Study Result Date: 03/10/2024 CLINICAL DATA:  Flank pain. EXAM: CT ABDOMEN AND PELVIS WITHOUT CONTRAST TECHNIQUE: Multidetector CT imaging of the abdomen and pelvis was performed following the standard protocol without IV contrast. RADIATION DOSE REDUCTION: This exam was performed according to the departmental dose-optimization program which includes automated exposure control, adjustment of the mA and/or kV according to patient size and/or use of iterative reconstruction technique. COMPARISON:  Oct 26, 2023 FINDINGS: Lower chest: No acute abnormality. Hepatobiliary: No focal liver abnormality is seen. No gallstones, gallbladder wall thickening, or biliary dilatation. Pancreas: Unremarkable. No pancreatic ductal dilatation or surrounding inflammatory changes. Spleen: Normal in size without focal abnormality. Adrenals/Urinary Tract: Adrenal glands are unremarkable. Kidneys are normal in size, without obstructing renal calculi or focal lesions. There is mild right-sided hydronephrosis. The urinary bladder is markedly distended and is otherwise unremarkable. Stomach/Bowel: Stomach is within normal limits. Appendix appears normal. No evidence of bowel wall thickening, distention, or inflammatory changes. Vascular/Lymphatic: Aortic atherosclerosis. No enlarged abdominal or pelvic lymph nodes. Reproductive: Prostate is unremarkable. Other: No abdominal wall hernia or abnormality. No abdominopelvic ascites. Musculoskeletal: No acute or significant osseous findings. IMPRESSION: 1. Mild right-sided hydronephrosis without evidence of obstructing renal calculi. 2. Markedly distended urinary bladder. 3. Aortic atherosclerosis. Electronically Signed   By: Suzen Dials M.D.   On: 03/10/2024 12:04    Scheduled Meds:  insulin  glargine  30 Units Subcutaneous BID   sodium chloride  flush  3 mL Intravenous Q12H    Continuous  Infusions:  sodium chloride      heparin  900 Units/hr (03/11/24 0821)     LOS: 0 days     Lebron JINNY Cage, MD [facility]  If 7PM-7AM, please contact night-coverage [email or web address] 03/11/2024, 11:20 AM

## 2024-03-11 NOTE — Interval H&P Note (Signed)
 History and Physical Interval Note:  03/11/2024 5:36 PM  Joshua Escobar  has presented today for surgery, with the diagnosis of chest pain.  The various methods of treatment have been discussed with the patient and family. After consideration of risks, benefits and other options for treatment, the patient has consented to  Procedure(s): LEFT HEART CATH AND CORONARY ANGIOGRAPHY (N/A) as a surgical intervention.  The patient's history has been reviewed, patient examined, no change in status, stable for surgery.  I have reviewed the patient's chart and labs.  Questions were answered to the patient's satisfaction.     Arnola Crittendon J Rica Heather

## 2024-03-11 NOTE — Inpatient Diabetes Management (Addendum)
 Inpatient Diabetes Program Recommendations  AACE/ADA: New Consensus Statement on Inpatient Glycemic Control  Target Ranges:  Prepandial:   less than 140 mg/dL      Peak postprandial:   less than 180 mg/dL (1-2 hours)      Critically ill patients:  140 - 180 mg/dL   Lab Results  Component Value Date   GLUCAP 171 (H) 03/11/2024   HGBA1C 12.7 (H) 03/11/2024    Latest Reference Range & Units 03/11/24 03:04  Potassium 3.5 - 5.1 mmol/L 4.0  Chloride 98 - 111 mmol/L 105  CO2 22 - 32 mmol/L 25  Glucose 70 - 99 mg/dL 845 (H)  BUN 6 - 20 mg/dL 28 (H)  Creatinine 9.38 - 1.24 mg/dL 8.95  Calcium  8.9 - 10.3 mg/dL 8.7 (L)  Anion gap 5 - 15  10   Review of Glycemic Control  Latest Reference Range & Units 03/10/24 23:27 03/11/24 00:43 03/11/24 01:53 03/11/24 03:02 03/11/24 04:07 03/11/24 07:02 03/11/24 08:23  Glucose-Capillary 70 - 99 mg/dL 784 (H) 759 (H) 816 (H) 151 (H) 109 (H) 162 (H) 171 (H)    Diabetes history: DM2  Outpatient Diabetes medications: Lantus  50 units daily and Humalog  8 units TID.   Current orders for Inpatient glycemic control: Transitioned from IV Insulin  to Lantus  30 units BID.   Inpatient Diabetes Program Recommendations:   Please consider Novolog  0-9 units TID and 0-5 units at bedtime.   May also consider adding meal coverage once diet is ordered.    Addendum @ 1400: Spoke with patient at bedside. Confirmed patient takes Lantus  50 units daily and Novolog  8 units TID.  Patient confirmed he has been taking insulin  regularly until he become sick over the last few days prior to admission. Made pt aware of A1C of 12.7% (Avg CBG of 318 mg/dl). Patient drowsy and not willing to discuss diabetes management at this time.   Thanks,  Joshua Search, RN, MSN, [facility]  Inpatient Diabetes Coordinator  Pager [phone] (8a-5p)

## 2024-03-11 NOTE — Progress Notes (Signed)
 Admission    Admission assessment completed by patient at bedside.   Other tasks to be completed by Primary RN.

## 2024-03-11 NOTE — ED Notes (Signed)
 Patient was given a cup of water.

## 2024-03-11 NOTE — ED Notes (Signed)
Checked patient cbg it was 31 notified RN of blood sugar patient is resting with call bell in reach

## 2024-03-11 NOTE — Progress Notes (Signed)
 PHARMACY - ANTICOAGULATION CONSULT NOTE  Pharmacy Consult for heparin   Indication: chest pain/ACS  No Known Allergies  Patient Measurements: Height: 5' 8 (172.7 cm) Weight: 73.5 kg (162 lb) IBW/kg (Calculated) : 68.4 HEPARIN  DW (KG): 73.5  Vital Signs: Temp: 98 F (36.7 C) (10/14 0748) Temp Source: Oral (10/14 0748) BP: 153/90 (10/14 0748) Pulse Rate: 92 (10/14 0748)  Labs: Recent Labs    03/10/24 1018 03/10/24 1201 03/10/24 1331 03/10/24 1843 03/11/24 0304 03/11/24 0420  HGB 14.2 14.6  15.0  --   --   --  13.4  HCT 44.7 43.0  44.0  --   --   --  42.5  PLT 239  --   --   --   --  190  CREATININE 1.67* 1.60* 1.63* 1.19 1.04  --     Estimated Creatinine Clearance: 80.4 mL/min (by C-G formula based on SCr of 1.04 mg/dL).   Medical History: Past Medical History:  Diagnosis Date   MI (myocardial infarction) (HCC) 10/2019   Migraine headache    Type II diabetes mellitus (HCC)     Assessment: Patient admitted with CC of nausea and vomiting. This AM, EKG obtained and trop ordered. HgB 13.4 and PLTs 190. Patient not on anticoagulation PTA. HgB 13.4 and PLTs 190. Pharmacy consulted to dose heparin  gtt.   Goal of Therapy:  Heparin  level 0.3-0.7 units/ml Monitor platelets by anticoagulation protocol: Yes   Plan:  No bolus with recent heparin  subcutaneous administration.  Start heparin  infusion at 900 units/hr Check anti-Xa level in 6 hours and daily while on heparin  Continue to monitor H&H and platelets D/c heparin  subcutaneous.   Powell Blush, PharmD, BCCCP  03/11/2024,7:53 AM

## 2024-03-11 NOTE — Progress Notes (Signed)
 PHARMACY - ANTICOAGULATION CONSULT NOTE  Pharmacy Consult for heparin   Indication: chest pain/ACS  No Known Allergies  Patient Measurements: Height: 5' 8 (172.7 cm) Weight: 66.9 kg (147 lb 8 oz) IBW/kg (Calculated) : 68.4 HEPARIN  DW (KG): 66.9  Vital Signs: Temp: 98.6 F (37 C) (10/14 1657) Temp Source: Oral (10/14 1657) BP: 140/89 (10/14 1657) Pulse Rate: 92 (10/14 1657)  Labs: Recent Labs    03/10/24 1018 03/10/24 1201 03/10/24 1331 03/10/24 1843 03/11/24 0304 03/11/24 0420 03/11/24 0650 03/11/24 0919 03/11/24 1531  HGB 14.2 14.6  15.0  --   --   --  13.4  --   --   --   HCT 44.7 43.0  44.0  --   --   --  42.5  --   --   --   PLT 239  --   --   --   --  190  --   --   --   HEPARINUNFRC  --   --   --   --   --   --   --   --  0.10*  CREATININE 1.67* 1.60* 1.63* 1.19 1.04  --   --   --   --   TROPONINIHS  --   --   --   --   --   --  5 5  --     Estimated Creatinine Clearance: 78.6 mL/min (by C-G formula based on SCr of 1.04 mg/dL).   Assessment: Patient admitted with CC of nausea and vomiting. This AM, EKG obtained and trop ordered. HgB 13.4 and PLTs 190. Patient not on anticoagulation PTA. HgB 13.4 and PLTs 190. Pharmacy consulted to dose heparin  gtt.   Heparin  level subtherapeutic (0.1) on infusion at 900 units/hr. No issues with line or bleeding reported per RN.  Goal of Therapy:  Heparin  level 0.3-0.7 units/ml Monitor platelets by anticoagulation protocol: Yes   Plan:  Bolus heparin  2000 units and increase heparin  infusion to 1150 units/hr.  Will f/u 6 hr heparin  level  Vito Ralph, PharmD, BCPS Please see amion for complete clinical pharmacist phone list 03/11/2024,5:04 PM

## 2024-03-12 ENCOUNTER — Encounter (HOSPITAL_COMMUNITY): Payer: Self-pay | Admitting: Cardiology

## 2024-03-12 DIAGNOSIS — E1110 Type 2 diabetes mellitus with ketoacidosis without coma: Secondary | ICD-10-CM | POA: Diagnosis not present

## 2024-03-12 DIAGNOSIS — R109 Unspecified abdominal pain: Secondary | ICD-10-CM | POA: Diagnosis not present

## 2024-03-12 DIAGNOSIS — R079 Chest pain, unspecified: Secondary | ICD-10-CM | POA: Diagnosis not present

## 2024-03-12 DIAGNOSIS — E785 Hyperlipidemia, unspecified: Secondary | ICD-10-CM | POA: Diagnosis not present

## 2024-03-12 DIAGNOSIS — E876 Hypokalemia: Secondary | ICD-10-CM | POA: Diagnosis not present

## 2024-03-12 DIAGNOSIS — E0810 Diabetes mellitus due to underlying condition with ketoacidosis without coma: Secondary | ICD-10-CM | POA: Diagnosis not present

## 2024-03-12 DIAGNOSIS — R0789 Other chest pain: Secondary | ICD-10-CM | POA: Diagnosis not present

## 2024-03-12 LAB — LIPID PANEL
Cholesterol: 142 mg/dL (ref 0–200)
HDL: 70 mg/dL (ref >40)
LDL Cholesterol: 60 mg/dL (ref 0–99)
Total CHOL/HDL Ratio: 2 ratio
Triglycerides: 59 mg/dL (ref <150)
VLDL: 12 mg/dL (ref 0–40)

## 2024-03-12 LAB — GLUCOSE, CAPILLARY
Glucose-Capillary: 108 mg/dL — ABNORMAL HIGH (ref 70–99)
Glucose-Capillary: 131 mg/dL — ABNORMAL HIGH (ref 70–99)
Glucose-Capillary: 142 mg/dL — ABNORMAL HIGH (ref 70–99)
Glucose-Capillary: 150 mg/dL — ABNORMAL HIGH (ref 70–99)
Glucose-Capillary: 58 mg/dL — ABNORMAL LOW (ref 70–99)
Glucose-Capillary: 63 mg/dL — ABNORMAL LOW (ref 70–99)
Glucose-Capillary: 84 mg/dL (ref 70–99)
Glucose-Capillary: 90 mg/dL (ref 70–99)
Glucose-Capillary: 91 mg/dL (ref 70–99)

## 2024-03-12 LAB — BASIC METABOLIC PANEL WITH GFR
Anion gap: 11 (ref 5–15)
BUN: 19 mg/dL (ref 6–20)
CO2: 24 mmol/L (ref 22–32)
Calcium: 8.5 mg/dL — ABNORMAL LOW (ref 8.9–10.3)
Chloride: 103 mmol/L (ref 98–111)
Creatinine, Ser: 0.85 mg/dL (ref 0.61–1.24)
GFR, Estimated: >60 mL/min (ref >60)
Glucose, Bld: 50 mg/dL — ABNORMAL LOW (ref 70–99)
Potassium: 2.9 mmol/L — ABNORMAL LOW (ref 3.5–5.1)
Sodium: 138 mmol/L (ref 135–145)

## 2024-03-12 LAB — CBC
HCT: 41.9 % (ref 39.0–52.0)
Hemoglobin: 13.4 g/dL (ref 13.0–17.0)
MCH: 25.5 pg — ABNORMAL LOW (ref 26.0–34.0)
MCHC: 32 g/dL (ref 30.0–36.0)
MCV: 79.7 fL — ABNORMAL LOW (ref 80.0–100.0)
Platelets: 205 K/uL (ref 150–400)
RBC: 5.26 MIL/uL (ref 4.22–5.81)
RDW: 13.3 % (ref 11.5–15.5)
WBC: 7.6 K/uL (ref 4.0–10.5)
nRBC: 0 % (ref 0.0–0.2)

## 2024-03-12 LAB — HEPARIN LEVEL (UNFRACTIONATED): Heparin Unfractionated: <0.1 [IU]/mL — ABNORMAL LOW (ref 0.30–0.70)

## 2024-03-12 MED ORDER — POTASSIUM CHLORIDE 10 MEQ/100ML IV SOLN
10.0000 meq | INTRAVENOUS | Status: AC
  Administered 2024-03-12 (×3): 10 meq via INTRAVENOUS
  Filled 2024-03-12 (×3): qty 100

## 2024-03-12 MED ORDER — INSULIN GLARGINE-YFGN 100 UNIT/ML ~~LOC~~ SOLN
20.0000 [IU] | Freq: Two times a day (BID) | SUBCUTANEOUS | Status: DC
  Administered 2024-03-12 – 2024-03-13 (×2): 20 [IU] via SUBCUTANEOUS
  Filled 2024-03-12 (×3): qty 0.2

## 2024-03-12 MED ORDER — PANTOPRAZOLE SODIUM 40 MG PO TBEC
40.0000 mg | DELAYED_RELEASE_TABLET | Freq: Every day | ORAL | Status: DC
  Administered 2024-03-12: 40 mg via ORAL
  Filled 2024-03-12: qty 2

## 2024-03-12 MED ORDER — PANTOPRAZOLE SODIUM 40 MG IV SOLR
40.0000 mg | Freq: Two times a day (BID) | INTRAVENOUS | Status: DC
  Administered 2024-03-12 – 2024-03-14 (×5): 40 mg via INTRAVENOUS
  Filled 2024-03-12 (×5): qty 10

## 2024-03-12 MED ORDER — INSULIN GLARGINE-YFGN 100 UNIT/ML ~~LOC~~ SOLN
30.0000 [IU] | Freq: Two times a day (BID) | SUBCUTANEOUS | Status: DC
  Filled 2024-03-12: qty 0.3

## 2024-03-12 MED ORDER — ENOXAPARIN SODIUM 40 MG/0.4ML IJ SOSY
40.0000 mg | PREFILLED_SYRINGE | INTRAMUSCULAR | Status: DC
  Administered 2024-03-12 – 2024-03-13 (×2): 40 mg via SUBCUTANEOUS
  Filled 2024-03-12 (×2): qty 0.4

## 2024-03-12 MED ORDER — ALUM & MAG HYDROXIDE-SIMETH 200-200-20 MG/5ML PO SUSP: 30.0000 mL | Freq: Four times a day (QID) | ORAL | Status: DC | PRN

## 2024-03-12 NOTE — Progress Notes (Addendum)
  Progress Note  Patient Name: Joshua Escobar Date of Encounter: 03/12/2024 [facility] Cardiologist: Newman JINNY Lawrence, MD   Interval Summary   Patient reported ongoing chest pain.  When asked to point at where his pain was located it appears like the pain is more abdominal in nature.  He also has had some nausea and vomiting this morning.  Vital Signs Vitals:   03/11/24 1823 03/11/24 1854 03/12/24 0012 03/12/24 0341  BP:  (!) 134/100 128/79 115/70  Pulse: (!) 0 89 93 85  Resp:   20 20  Temp:   98.8 F (37.1 C) 97.8 F (36.6 C)  TempSrc:   Oral Oral  SpO2:  98% 100% 99%  Weight:      Height:        Intake/Output Summary (Last 24 hours) at 03/12/2024 0728 Last data filed at 03/12/2024 0657 Gross per 24 hour  Intake 1716.26 ml  Output 650 ml  Net 1066.26 ml      03/11/2024    1:26 PM 03/10/2024   10:16 AM 01/15/2024   11:23 AM  Last 3 Weights  Weight (lbs) 147 lb 8 oz 162 lb 162 lb  Weight (kg) 66.906 kg 73.483 kg 73.483 kg      Telemetry/ECG  Normal sinus rhythm with heart rates in the 70s to 90- Personally Reviewed  Physical Exam  GEN: No acute distress.  Alert and orientated on room air. Neck: No JVD Cardiac: Chest is tender to palpation GI: Soft, tender in left and right lower quadrants. MS: No edema  Assessment & Plan   Chest pain Nonobstructive CAD Hyperlipidemia Patient presented to the emergency department in DKA and left-sided chest pain.  The patient also had some EKG changes that were concerning for ischemia. High-sensitivity troponins are negative x 2. Patient was started on IV heparin . Patient went to the Cath Lab yesterday and was found to have minimal nonobstructive CAD in the LAD.  The patient was recommended to be managed medically and the IV heparin  was stopped.  A lipid panel was ordered and showed an LDL of 60. Patient previously received nitroglycerin  and stated that it did not help his discomfort. Suspect that pain is  noncardiac in nature. Continue rosuvastatin  20 mg daily    [facility] will sign off.   The patient is ready for discharge today from a cardiac standpoint. Medication Recommendations: As above Other recommendations (labs, testing, etc): None Follow up as an outpatient: As needed For questions or updates, please contact [facility] Please consult [email or web address] for contact info under        Signed, Javar Eshbach, PA-C

## 2024-03-12 NOTE — Plan of Care (Signed)
  Problem: Tissue Perfusion: Goal: Adequacy of tissue perfusion will improve Outcome: Progressing   Problem: Elimination: Goal: Will not experience complications related to bowel motility Outcome: Progressing   Problem: Pain Managment: Goal: General experience of comfort will improve and/or be controlled Outcome: Progressing   Problem: Cardiovascular: Goal: Ability to achieve and maintain adequate cardiovascular perfusion will improve Outcome: Progressing

## 2024-03-12 NOTE — Progress Notes (Addendum)
 Progress Note   Patient: Joshua Escobar FMW:[id number] DOB: [birth year] DOA: 03/10/2024     0 DOS: the patient was seen and examined on 03/12/2024   Brief hospital course: Joshua Escobar is a 53 y.o. male with past medical history of type 2 diabetes with left-sided flank pain, nausea and vomiting for the last 2 to 3 days PTA, with some mild urgency and frequency.  Very noncompliant with his insulin  regimen.  In the ED, vital signs fairly stable. Labs were notable for i-STAT glucose of 500, creatinine elevated at 1.6.  Anion gap of 20, with noted acidosis, bilirubin slightly elevated 1.3, Beta hydroxybutyrate was elevated. CT renal study showed mild right-sided hydronephrosis without any renal calculi with distended urinary bladder.  UA negative for infection.  Patient admitted for further management.       Early this a.m., patient noted to have significant 10/10 left-sided chest pain, of which he describes as squeezing, with associated nausea but denies any vomiting.  Patient does report some generalized abdominal tenderness.  Cardiology consulted for concerning EKG changes.  Assessment and Plan: DKA Diabetes mellitus type 2, uncontrolled with hyperglycemia Patient noncompliant Last A1c 12.7 S/p insulin  drip, switched to SSI, Lantus , Accu-Cheks, hypoglycemic protocol Patient has 1 episode of hypoglycemia in 60s, decrease Lantus  to 20 units twice daily from 30 units twice daily 10/15 Monitor closely   Left-sided chest pain History of nonobstructive CAD HLD Possible cardiac, in the setting of uncontrolled DM Troponin 5-->5, flat trend EKG showed ST changes concerning for anterior septal infarct Echo showed EF of 60 to 65%, no regional wall motion abnormality Start ASA, nitroglycerin  SL, statins, heparin  drip Cardiology consulted, recommended n.p.o. and possible left heart cath later on today Telemetry   Generalized abdominal pain Associated with nausea/vomiting Afebrile, with mild  leukocytosis LFTs WNL except for mildly elevated bilirubin, lipase WNL UA positive for glucose greater than 500, ketones, otherwise unremarkable Chest x-ray pending CT renal stone showed mild right-sided hydronephrosis without nephrolithiasis, distended urinary bladder Bladder scan to rule out retention   Alcohol abuse Advised to quit   Tobacco abuse Advised to quit  Hypokalemia at 2.9 Replaced, will check potassium level in the morning         Subjective: Denies any fever chills nausea vomiting abdomen pain chest pain shortness of palpitations  Physical Exam: Vitals:   03/12/24 0341 03/12/24 0748 03/12/24 1149 03/12/24 1549  BP: 115/70 (!) 157/99 129/80 132/77  Pulse: 85 83 82 78  Resp: 20 18 18    Temp: 97.8 F (36.6 C) 98.9 F (37.2 C) 98.6 F (37 C) 98.9 F (37.2 C)  TempSrc: Oral Oral Oral Oral  SpO2: 99% 100% 100% 97%  Weight:      Height:       Constitutional: Alert, awake, calm, comfortable HEENT: Neck supple Respiratory: clear to auscultation bilaterally, no wheezing, no crackles. Normal respiratory effort. No accessory muscle use.  Cardiovascular: Regular rate and rhythm, no murmurs / rubs / gallops. No extremity edema. 2+ pedal pulses. No carotid bruits.  Abdomen: no tenderness, no masses palpated. No hepatosplenomegaly. Bowel sounds positive.  Musculoskeletal: no clubbing / cyanosis. No joint deformity upper and lower extremities. Good ROM, no contractures. Normal muscle tone.  Skin: no rashes, lesions, ulcers. No induration Neurologic: CN 2-12 grossly intact. Sensation intact, DTR normal. Strength 5/5 x all 4 extremities.  Psychiatric: Normal judgment and insight. Alert and oriented x 3. Normal mood.   Data Reviewed:  Potassium2.9  Family Communication: None  Disposition:  Status is: Observation The patient remains OBS appropriate and will d/c before 2 midnights.  Planned Discharge Destination: Home    Time spent: 35 minutes  Author: Namir Neto, MD 03/12/2024 5:10 PM  For on call review [email or web address].

## 2024-03-12 NOTE — Progress Notes (Signed)
 Hypoglycemic Event  CBG: 63  Treatment: 8 oz juice/soda  Symptoms: None  Follow-up CBG: Time:1735 CBG Result:91  Possible Reasons for Event: Inadequate meal intake  Comments/MD notified: keshab paudel,md    Jaylin Benzel R

## 2024-03-12 NOTE — Inpatient Diabetes Management (Signed)
 Inpatient Diabetes Program Recommendations  AACE/ADA: New Consensus Statement on Inpatient Glycemic Control (2015)  Target Ranges:  Prepandial:   less than 140 mg/dL      Peak postprandial:   less than 180 mg/dL (1-2 hours)      Critically ill patients:  140 - 180 mg/dL   Lab Results  Component Value Date   GLUCAP 84 03/12/2024   HGBA1C 12.7 (H) 03/11/2024    Review of Glycemic Control  Latest Reference Range & Units 03/11/24 20:15 03/12/24 00:09 03/12/24 04:27 03/12/24 05:01 03/12/24 08:37 03/12/24 12:07  Glucose-Capillary 70 - 99 mg/dL 868 (H) 90 58 (L) 857 (H) 131 (H) 84   Diabetes history: DM 2 Outpatient Diabetes medications:  FSL 3 Humalog  8 units tid  Lantus  50 units daily Current orders for Inpatient glycemic control:  Novolog  0-9 units q 4 hours Lantus  30 units bid  Inpatient Diabetes Program Recommendations:   Note low blood sugars.  Please consider reducing Lantus  to 25 units bid.    Thanks,  Randall Bullocks, RN, BC-ADM Inpatient Diabetes Coordinator Pager [phone]  (8a-5p)

## 2024-03-12 NOTE — Progress Notes (Signed)
 Checked right radial cath sight. Patient reported some tenderness to the sight. No significant swelling or hematoma present. Pulses present. Hand warm to touch. Instructed patient on cath sight care and patient expressed understanding and reported he had previously been informed about weight restrictions and cleaning instructions.  Will place detailed instructions below.   Radial Site Care Refer to this sheet in the next few weeks. These instructions provide you with information on caring for yourself after your procedure. Your caregiver may also give you more specific instructions. Your treatment has been planned according to current medical practices, but problems sometimes occur. Call your caregiver if you have any problems or questions after your procedure. HOME CARE INSTRUCTIONS You may shower the day after the procedure. Remove the bandage (dressing) and gently wash the site with plain soap and water . Gently pat the site dry.  Do not apply powder or lotion to the site.  Do not submerge the affected site in water  for 3 to 5 days.  Inspect the site at least twice daily.  Do not flex or bend the affected arm for 24 hours.  No lifting over 5 pounds (2.3 kg) for 5 days after your procedure.  Do not drive home if you are discharged the same day of the procedure. Have someone else drive you.  You may drive 24 hours after the procedure unless otherwise instructed by your caregiver.  What to expect: Any bruising will usually fade within 1 to 2 weeks.  Blood that collects in the tissue (hematoma) may be painful to the touch. It should usually decrease in size and tenderness within 1 to 2 weeks.  SEEK IMMEDIATE MEDICAL CARE IF: You have unusual pain at the radial site.  You have redness, warmth, swelling, or pain at the radial site.  You have drainage (other than a small amount of blood on the dressing).  You have chills.  You have a fever or persistent symptoms for more than 72 hours.  You have a  fever and your symptoms suddenly get worse.  Your arm becomes pale, cool, tingly, or numb.  You have heavy bleeding from the site. Hold pressure on the site.     Signed,  Morse Clause, PA-C 03/12/2024, 10:41 AM

## 2024-03-12 NOTE — Plan of Care (Signed)
  Problem: Education: Goal: Ability to describe self-care measures that may prevent or decrease complications (Diabetes Survival Skills Education) will improve Outcome: Not Progressing Note: Pt educated but refuses to eat to prevent hypogylcemia   Problem: Coping: Goal: Ability to adjust to condition or change in health will improve Outcome: Progressing   Problem: Fluid Volume: Goal: Ability to maintain a balanced intake and output will improve Outcome: Not Progressing   Problem: Metabolic: Goal: Ability to maintain appropriate glucose levels will improve Outcome: Not Progressing   Problem: Nutritional: Goal: Maintenance of adequate nutrition will improve Outcome: Not Progressing   Problem: Skin Integrity: Goal: Risk for impaired skin integrity will decrease Outcome: Adequate for Discharge   Problem: Tissue Perfusion: Goal: Adequacy of tissue perfusion will improve Outcome: Adequate for Discharge

## 2024-03-13 DIAGNOSIS — E785 Hyperlipidemia, unspecified: Secondary | ICD-10-CM | POA: Diagnosis present

## 2024-03-13 DIAGNOSIS — I2510 Atherosclerotic heart disease of native coronary artery without angina pectoris: Secondary | ICD-10-CM | POA: Diagnosis present

## 2024-03-13 DIAGNOSIS — E1165 Type 2 diabetes mellitus with hyperglycemia: Secondary | ICD-10-CM | POA: Diagnosis not present

## 2024-03-13 DIAGNOSIS — R9431 Abnormal electrocardiogram [ECG] [EKG]: Secondary | ICD-10-CM | POA: Diagnosis present

## 2024-03-13 DIAGNOSIS — E0810 Diabetes mellitus due to underlying condition with ketoacidosis without coma: Secondary | ICD-10-CM | POA: Diagnosis not present

## 2024-03-13 DIAGNOSIS — Z794 Long term (current) use of insulin: Secondary | ICD-10-CM | POA: Diagnosis not present

## 2024-03-13 DIAGNOSIS — K219 Gastro-esophageal reflux disease without esophagitis: Secondary | ICD-10-CM | POA: Diagnosis present

## 2024-03-13 DIAGNOSIS — Z91128 Patient's intentional underdosing of medication regimen for other reason: Secondary | ICD-10-CM | POA: Diagnosis not present

## 2024-03-13 DIAGNOSIS — E876 Hypokalemia: Secondary | ICD-10-CM | POA: Diagnosis not present

## 2024-03-13 DIAGNOSIS — N1330 Unspecified hydronephrosis: Secondary | ICD-10-CM | POA: Diagnosis present

## 2024-03-13 DIAGNOSIS — Z8249 Family history of ischemic heart disease and other diseases of the circulatory system: Secondary | ICD-10-CM | POA: Diagnosis not present

## 2024-03-13 DIAGNOSIS — F1010 Alcohol abuse, uncomplicated: Secondary | ICD-10-CM | POA: Diagnosis present

## 2024-03-13 DIAGNOSIS — T383X6A Underdosing of insulin and oral hypoglycemic [antidiabetic] drugs, initial encounter: Secondary | ICD-10-CM | POA: Diagnosis present

## 2024-03-13 DIAGNOSIS — I252 Old myocardial infarction: Secondary | ICD-10-CM | POA: Diagnosis not present

## 2024-03-13 DIAGNOSIS — Z833 Family history of diabetes mellitus: Secondary | ICD-10-CM | POA: Diagnosis not present

## 2024-03-13 DIAGNOSIS — B356 Tinea cruris: Secondary | ICD-10-CM | POA: Diagnosis present

## 2024-03-13 DIAGNOSIS — Z79899 Other long term (current) drug therapy: Secondary | ICD-10-CM | POA: Diagnosis not present

## 2024-03-13 DIAGNOSIS — K861 Other chronic pancreatitis: Secondary | ICD-10-CM | POA: Diagnosis present

## 2024-03-13 DIAGNOSIS — N179 Acute kidney failure, unspecified: Secondary | ICD-10-CM | POA: Diagnosis present

## 2024-03-13 DIAGNOSIS — E1110 Type 2 diabetes mellitus with ketoacidosis without coma: Secondary | ICD-10-CM | POA: Diagnosis present

## 2024-03-13 DIAGNOSIS — R0789 Other chest pain: Secondary | ICD-10-CM | POA: Diagnosis not present

## 2024-03-13 DIAGNOSIS — F17210 Nicotine dependence, cigarettes, uncomplicated: Secondary | ICD-10-CM | POA: Diagnosis present

## 2024-03-13 DIAGNOSIS — R109 Unspecified abdominal pain: Secondary | ICD-10-CM | POA: Diagnosis not present

## 2024-03-13 DIAGNOSIS — E11649 Type 2 diabetes mellitus with hypoglycemia without coma: Secondary | ICD-10-CM | POA: Diagnosis not present

## 2024-03-13 DIAGNOSIS — Z7982 Long term (current) use of aspirin: Secondary | ICD-10-CM | POA: Diagnosis not present

## 2024-03-13 DIAGNOSIS — R079 Chest pain, unspecified: Secondary | ICD-10-CM | POA: Diagnosis present

## 2024-03-13 LAB — GLUCOSE, CAPILLARY
Glucose-Capillary: 142 mg/dL — ABNORMAL HIGH (ref 70–99)
Glucose-Capillary: 208 mg/dL — ABNORMAL HIGH (ref 70–99)
Glucose-Capillary: 187 mg/dL — ABNORMAL HIGH (ref 70–99)
Glucose-Capillary: 43 mg/dL — CL (ref 70–99)
Glucose-Capillary: 51 mg/dL — ABNORMAL LOW (ref 70–99)
Glucose-Capillary: 208 mg/dL — ABNORMAL HIGH (ref 70–99)
Glucose-Capillary: 154 mg/dL — ABNORMAL HIGH (ref 70–99)

## 2024-03-13 LAB — BASIC METABOLIC PANEL WITH GFR
Anion gap: 8 (ref 5–15)
BUN: 13 mg/dL (ref 6–20)
CO2: 29 mmol/L (ref 22–32)
Calcium: 8.4 mg/dL — ABNORMAL LOW (ref 8.9–10.3)
Chloride: 97 mmol/L — ABNORMAL LOW (ref 98–111)
Creatinine, Ser: 1 mg/dL (ref 0.61–1.24)
GFR, Estimated: >60 mL/min (ref >60)
Glucose, Bld: 101 mg/dL — ABNORMAL HIGH (ref 70–99)
Potassium: 3.4 mmol/L — ABNORMAL LOW (ref 3.5–5.1)
Sodium: 134 mmol/L — ABNORMAL LOW (ref 135–145)

## 2024-03-13 LAB — MAGNESIUM: Magnesium: 2 mg/dL (ref 1.7–2.4)

## 2024-03-13 MED ORDER — INSULIN GLARGINE-YFGN 100 UNIT/ML ~~LOC~~ SOLN
25.0000 [IU] | Freq: Every day | SUBCUTANEOUS | Status: DC
  Filled 2024-03-13: qty 0.25

## 2024-03-13 MED ORDER — NYSTATIN 100000 UNIT/GM EX POWD
Freq: Two times a day (BID) | CUTANEOUS | Status: DC
  Filled 2024-03-13: qty 15

## 2024-03-13 MED ORDER — INSULIN ASPART 100 UNIT/ML IJ SOLN
0.0000 [IU] | Freq: Three times a day (TID) | INTRAMUSCULAR | Status: DC
  Administered 2024-03-14: 2 [IU] via SUBCUTANEOUS

## 2024-03-13 MED ORDER — POTASSIUM CHLORIDE 10 MEQ/100ML IV SOLN
10.0000 meq | INTRAVENOUS | Status: AC
  Administered 2024-03-13 (×3): 10 meq via INTRAVENOUS
  Filled 2024-03-13 (×2): qty 100

## 2024-03-13 NOTE — Inpatient Diabetes Management (Addendum)
 Inpatient Diabetes Program Recommendations  AACE/ADA: New Consensus Statement on Inpatient Glycemic Control (2015)  Target Ranges:  Prepandial:   less than 140 mg/dL      Peak postprandial:   less than 180 mg/dL (1-2 hours)      Critically ill patients:  140 - 180 mg/dL   Lab Results  Component Value Date   GLUCAP 187 (H) 03/13/2024   HGBA1C 12.7 (H) 03/11/2024    Review of Glycemic Control  Latest Reference Range & Units 03/12/24 23:56 03/13/24 04:29 03/13/24 08:08 03/13/24 10:34 03/13/24 10:52 03/13/24 11:25  Glucose-Capillary 70 - 99 mg/dL 849 (H) 857 (H) 791 (H) 43 (LL) 51 (L) 187 (H)   Diabetes history: DM 2 Outpatient Diabetes medications:  Lantus  50 units daily Current orders for Inpatient glycemic control:  Lantus  20 units bid Novolog  0-9 units q 4 hours  Inpatient Diabetes Program Recommendations:    Consider reducing Lantus  further to once daily.   Addendum 1430-Spoke to patient at bedside.  Discussed low blood sugars.  He states that he only takes 30-40 units of Lantus  at home due to  lows.  He also states that he reduced his meal coverage to 4 units tid at home.  He has worn a Dexcom in the past, and wants to resume when he goes home.  He has the app on his phone.  Will bring him a sample tomorrow.  Reiterated need for patient to prevent low blood sugars and check blood glucose more often.  Also discussed markedly elevated A1C.    Thanks,  Randall Bullocks, RN, BC-ADM Inpatient Diabetes Coordinator Pager [phone]  (8a-5p)

## 2024-03-13 NOTE — Progress Notes (Signed)
 Progress Note   Patient: Joshua Escobar FMW:[id number] DOB: [birth year] DOA: 03/10/2024     0 DOS: the patient was seen and examined on 03/13/2024   Brief hospital course: Joshua Escobar is a 53 y.o. male with past medical history of type 2 diabetes with left-sided flank pain, nausea and vomiting for the last 2 to 3 days PTA, with some mild urgency and frequency.  Very noncompliant with his insulin  regimen.  In the ED, vital signs fairly stable. Labs were notable for i-STAT glucose of 500, creatinine elevated at 1.6.  Anion gap of 20, with noted acidosis, bilirubin slightly elevated 1.3, Beta hydroxybutyrate was elevated. CT renal study showed mild right-sided hydronephrosis without any renal calculi with distended urinary bladder.  UA negative for infection.  Patient admitted for further management.       Early this a.m., patient noted to have significant 10/10 left-sided chest pain, of which he describes as squeezing, with associated nausea but denies any vomiting.  Patient does report some generalized abdominal tenderness.  Cardiology consulted for concerning EKG changes.  Assessment and Plan:  DKA Diabetes mellitus type 2, uncontrolled with hyperglycemia Patient noncompliant Last A1c 12.7 S/p insulin  drip, switched to SSI, Lantus , Accu-Cheks, hypoglycemic protocol Patient has 1 episode of hypoglycemia in 60s, decrease Lantus  to 20 units twice daily from 30 units twice daily 10/15, he again had hypoglycemia, 10/16: Insulin  changed to 25 at bedtime only 1 times daily.  His coverage was changed to 3 times daily. Monitor closely   Left-sided chest pain History of nonobstructive CAD HLD Possible cardiac, in the setting of uncontrolled DM Troponin 5-->5, flat trend EKG showed ST changes concerning for anterior septal infarct Echo showed EF of 60 to 65%, no regional wall motion abnormality Start ASA, nitroglycerin  SL, statins, heparin  drip Cardiology consulted, recommended n.p.o. and  possible left heart cath later on today Telemetry   Generalized abdominal pain Associated with nausea/vomiting Afebrile, with mild leukocytosis LFTs WNL except for mildly elevated bilirubin, lipase WNL UA positive for glucose greater than 500, ketones, otherwise unremarkable Chest x-ray pending CT renal stone showed mild right-sided hydronephrosis without nephrolithiasis, distended urinary bladder Bladder scan to rule out retention   Alcohol abuse Advised to quit   Tobacco abuse Advised to quit   Hypokalemia at 3.4 Replaced, will check potassium level in the morning  Left groin fungal infection Nystatin powder has been ordered.     Subjective: Episode of hypoglycemia  Physical Exam: Vitals:   03/13/24 0429 03/13/24 0811 03/13/24 1129 03/13/24 1619  BP: 100/66 96/77 (!) 93/53 121/69  Pulse:  84 97 88  Resp: 18 17 16 16   Temp: 98.7 F (37.1 C) 99 F (37.2 C) 97.7 F (36.5 C) 99.8 F (37.7 C)  TempSrc: Oral Oral Oral Oral  SpO2:  95% 99% 99%  Weight: 67.2 kg     Height:       Constitutional: Alert, awake, calm, comfortable HEENT: Neck supple Respiratory: clear to auscultation bilaterally, no wheezing, no crackles. Normal respiratory effort. No accessory muscle use.  Cardiovascular: Regular rate and rhythm, no murmurs / rubs / gallops. No extremity edema. 2+ pedal pulses. No carotid bruits.  Abdomen: no tenderness, no masses palpated. No hepatosplenomegaly. Bowel sounds positive.  Musculoskeletal: no clubbing / cyanosis. No joint deformity upper and lower extremities. Good ROM, no contractures. Normal muscle tone.  Skin: no rashes, lesions, ulcers. No induration Neurologic: CN 2-12 grossly intact. Sensation intact, DTR normal. Strength 5/5 x all 4 extremities.  Psychiatric:  Normal judgment and insight. Alert and oriented x 3. Normal mood.   Data Reviewed:  Hypoglycemia  Family Communication: None  Disposition: Status is: Inpatient Remains inpatient  appropriate because: Ongoing recovery from hypoglycemia  Planned Discharge Destination: Home    Time spent: 35 minutes  Author: Nena Rebel, MD 03/13/2024 4:58 PM  For on call review [email or web address].

## 2024-03-14 ENCOUNTER — Telehealth: Payer: Self-pay | Admitting: Family Medicine

## 2024-03-14 ENCOUNTER — Other Ambulatory Visit (HOSPITAL_COMMUNITY): Payer: Self-pay

## 2024-03-14 DIAGNOSIS — F17200 Nicotine dependence, unspecified, uncomplicated: Secondary | ICD-10-CM

## 2024-03-14 DIAGNOSIS — E1165 Type 2 diabetes mellitus with hyperglycemia: Secondary | ICD-10-CM

## 2024-03-14 DIAGNOSIS — R109 Unspecified abdominal pain: Secondary | ICD-10-CM

## 2024-03-14 DIAGNOSIS — R0789 Other chest pain: Secondary | ICD-10-CM

## 2024-03-14 DIAGNOSIS — E876 Hypokalemia: Secondary | ICD-10-CM

## 2024-03-14 DIAGNOSIS — E0810 Diabetes mellitus due to underlying condition with ketoacidosis without coma: Secondary | ICD-10-CM | POA: Diagnosis not present

## 2024-03-14 DIAGNOSIS — F1090 Alcohol use, unspecified, uncomplicated: Secondary | ICD-10-CM

## 2024-03-14 DIAGNOSIS — B356 Tinea cruris: Secondary | ICD-10-CM

## 2024-03-14 LAB — BASIC METABOLIC PANEL WITH GFR
Anion gap: 7 (ref 5–15)
BUN: 19 mg/dL (ref 6–20)
CO2: 25 mmol/L (ref 22–32)
Calcium: 8.3 mg/dL — ABNORMAL LOW (ref 8.9–10.3)
Chloride: 102 mmol/L (ref 98–111)
Creatinine, Ser: 1.17 mg/dL (ref 0.61–1.24)
GFR, Estimated: >60 mL/min (ref >60)
Glucose, Bld: 126 mg/dL — ABNORMAL HIGH (ref 70–99)
Potassium: 4 mmol/L (ref 3.5–5.1)
Sodium: 134 mmol/L — ABNORMAL LOW (ref 135–145)

## 2024-03-14 LAB — GLUCOSE, CAPILLARY
Glucose-Capillary: 192 mg/dL — ABNORMAL HIGH (ref 70–99)
Glucose-Capillary: 82 mg/dL (ref 70–99)

## 2024-03-14 LAB — LIPOPROTEIN A (LPA): Lipoprotein (a): 62.1 nmol/L — ABNORMAL HIGH (ref <75.0)

## 2024-03-14 MED ORDER — NYSTATIN 100000 UNIT/GM EX POWD: Freq: Two times a day (BID) | CUTANEOUS | 0 refills | Status: DC

## 2024-03-14 MED ORDER — ROSUVASTATIN CALCIUM 10 MG PO TABS
10.0000 mg | ORAL_TABLET | Freq: Every day | ORAL | 11 refills | Status: AC
  Filled 2024-03-14: qty 30, 30d supply, fill #0

## 2024-03-14 MED ORDER — DEXCOM G7 SENSOR MISC
3.0000 [IU] | 0 refills | Status: DC
  Filled 2024-03-14: qty 3, 30d supply, fill #0

## 2024-03-14 MED ORDER — INSULIN LISPRO (1 UNIT DIAL) 100 UNIT/ML (KWIKPEN)
3.0000 [IU] | PEN_INJECTOR | Freq: Three times a day (TID) | SUBCUTANEOUS | 11 refills | Status: AC
  Filled 2024-03-14: qty 3, 28d supply, fill #0

## 2024-03-14 MED ORDER — BAQSIMI ONE PACK 3 MG/DOSE NA POWD
3.0000 mg | Freq: Once | NASAL | 0 refills | Status: AC | PRN
  Filled 2024-03-14: qty 1, 30d supply, fill #0

## 2024-03-14 MED ORDER — POLYETHYLENE GLYCOL 3350 17 G PO PACK: 17.0000 g | PACK | Freq: Every day | ORAL | 0 refills | Status: AC | PRN

## 2024-03-14 MED ORDER — INSULIN PEN NEEDLE 32G X 4 MM MISC
1.0000 | 0 refills | Status: DC
  Filled 2024-03-14: qty 100, 90d supply, fill #0

## 2024-03-14 MED ORDER — INSULIN GLARGINE-YFGN 100 UNIT/ML ~~LOC~~ SOLN: 25.0000 [IU] | Freq: Every day | SUBCUTANEOUS | 11 refills | Status: DC

## 2024-03-14 NOTE — Discharge Summary (Signed)
 Physician Discharge Summary   Patient: Joshua Escobar MRN: [id number] DOB: Apr 01, 1971  Admit date:     03/10/2024  Discharge date: 03/14/24  Discharge Physician: Nena Rebel   PCP: Duanne Butler DASEN, MD   Recommendations at discharge:   Continue insulin  Lantus  and sliding scale Continue Protonix  oral twice a day Continue aspirin  and rosuvastatin  Follow-up with cardiology as scheduled  Discharge Diagnoses: Active Problems:   DKA (diabetic ketoacidosis) (HCC)  Resolved Problems:   * No resolved hospital problems. *  Hospital Course: Joshua Escobar is a 53 y.o. male with past medical history of type 2 diabetes with left-sided flank pain, nausea and vomiting for the last 2 to 3 days PTA, with some mild urgency and frequency.  Very noncompliant with his insulin  regimen.  In the ED, vital signs fairly stable. Labs were notable for i-STAT glucose of 500, creatinine elevated at 1.6.  Anion gap of 20, with noted acidosis, bilirubin slightly elevated 1.3, Beta hydroxybutyrate was elevated. CT renal study showed mild right-sided hydronephrosis without any renal calculi with distended urinary bladder.  UA negative for infection.  Patient admitted for further management.       Early this a.m., patient noted to have significant 10/10 left-sided chest pain, of which he describes as squeezing, with associated nausea but denies any vomiting.  Patient does report some generalized abdominal tenderness.  Cardiology consulted for concerning EKG changes.  Assessment and Plan:  DKA Diabetes mellitus type 2, uncontrolled with hyperglycemia Patient noncompliant Last A1c 12.7 S/p insulin  drip, switched to SSI, Lantus , Accu-Cheks, hypoglycemic protocol Patient was not using any medication at home for more than a week.  He was given initially Lantus  30 units twice daily but he ended up getting hypoglycemic incident.  Decreased Lantus  to 20 units twice daily again he ended up having hypoglycemic  incident.  Later discussed with the patient and patient advised that he was not using Lantus  regularly.  Now he has been placed on 25 units of Lantus  at bedtime with sliding scale.  Extensive counseling was done regarding compliance.  Left-sided chest pain History of nonobstructive CAD HLD Possible cardiac, in the setting of uncontrolled DM Troponin 5-->5, flat trend EKG showed ST changes concerning for anterior septal infarct Echo showed EF of 60 to 65%, no regional wall motion abnormality Start ASA, nitroglycerin  SL, statins, initially treated with heparin  S/p LHC, nonobstructive coronaries, advised aspirin  and Crestor   Generalized abdominal pain Workup negative, improved likely GERD placed on Protonix  oral  Alcohol abuse Advised to quit   Tobacco abuse Advised to quit   Hypokalemia  Replaced, resolved   Left groin fungal infection Nystatin powder has been ordered.  Improved he will continue put on nystatin at home.        Consultants: Cardiology Procedures performed: Left heart catheterization Disposition: Home Diet recommendation:  Discharge Diet Orders (From admission, onward)     Start     Ordered   03/14/24 0000  Diet Carb Modified        03/14/24 1116           Carb modified diet DISCHARGE MEDICATION: Allergies as of 03/14/2024   No Known Allergies      Medication List     STOP taking these medications    Lantus  SoloStar 100 UNIT/ML Solostar Pen Generic drug: insulin  glargine       TAKE these medications    aspirin  EC 81 MG tablet Take 1 tablet (81 mg total) by mouth daily. Swallow whole.  cholestyramine  4 g packet Commonly known as: Questran  Take 1 packet (4 g total) by mouth at bedtime.   FreeStyle Libre 3 Plus Sensor Misc Inject 1 each into the skin daily.   insulin  glargine-yfgn 100 UNIT/ML injection Commonly known as: SEMGLEE  Inject 0.25 mLs (25 Units total) into the skin at bedtime.   insulin  lispro 100 UNIT/ML  KwikPen Commonly known as: HumaLOG  KwikPen Inject 8 Units into the skin 3 (three) times daily. What changed: how much to take   meclizine  25 MG tablet Commonly known as: ANTIVERT  Take 1 tablet (25 mg total) by mouth 3 (three) times daily as needed for dizziness (vertigo).   nystatin powder Commonly known as: MYCOSTATIN/NYSTOP Apply topically 2 (two) times daily.   ondansetron  4 MG tablet Commonly known as: ZOFRAN  Take 1 tablet (4 mg total) by mouth every 6 (six) hours as needed for up to 30 doses for nausea or vomiting.   pantoprazole  40 MG tablet Commonly known as: Protonix  Take 1 tablet (40 mg total) by mouth 2 (two) times daily before a meal.   polyethylene glycol 17 g packet Commonly known as: MIRALAX  / GLYCOLAX  Take 17 g by mouth daily as needed for mild constipation.   rosuvastatin  10 MG tablet Commonly known as: Crestor  Take 1 tablet (10 mg total) by mouth daily.        Discharge Exam: Filed Weights   03/11/24 1326 03/13/24 0429 03/14/24 0547  Weight: 66.9 kg 67.2 kg 66.4 kg   Constitutional: Alert, awake, calm, comfortable HEENT: Neck supple Respiratory: clear to auscultation bilaterally, no wheezing, no crackles. Normal respiratory effort. No accessory muscle use.  Cardiovascular: Regular rate and rhythm, no murmurs / rubs / gallops. No extremity edema. 2+ pedal pulses. No carotid bruits.  Abdomen: no tenderness, no masses palpated. No hepatosplenomegaly. Bowel sounds positive.  Musculoskeletal: no clubbing / cyanosis. No joint deformity upper and lower extremities. Good ROM, no contractures. Normal muscle tone.  Skin: no rashes, lesions, ulcers. No induration Neurologic: CN 2-12 grossly intact. Sensation intact, DTR normal. Strength 5/5 x all 4 extremities.  Psychiatric: Normal judgment and insight. Alert and oriented x 3. Normal mood.    Condition at discharge: stable  The results of significant diagnostics from this hospitalization (including imaging,  microbiology, ancillary and laboratory) are listed below for reference.   Imaging Studies: CARDIAC CATHETERIZATION Result Date: 03/11/2024 Coronary angiography 03/11/2024: LM: Normal LAD: Minimal luminal irregularities Lcx: Co-dominant. No significant disease RCA: Co-dominant. No significant disease LVEDP 13 mmHg Conclusion: No significant epicardial coronary artery disease Constant chest pain with normal troponin make MINOCA unlikely. Consider noncardiac etiology for chest pain. Newman JINNY Lawrence, MD   DG CHEST PORT 1 VIEW Result Date: 03/11/2024 CLINICAL DATA:  Chest pain. EXAM: PORTABLE CHEST 1 VIEW COMPARISON:  June 21, 2023 FINDINGS: The heart size and mediastinal contours are within normal limits. Both lungs are clear. The visualized skeletal structures are unremarkable. IMPRESSION: No active disease. Electronically Signed   By: Suzen Dials M.D.   On: 03/11/2024 12:28   ECHOCARDIOGRAM COMPLETE Result Date: 03/11/2024    ECHOCARDIOGRAM REPORT   Patient Name:   Joshua Escobar Date of Exam: 03/11/2024 Medical Rec #:  [id number]       Height:       68.0 in Accession #:    [id number]      Weight:       162.0 lb Date of Birth:  1970-12-14       BSA:  1.869 m Patient Age:    53 years        BP:           153/90 mmHg Patient Gender: M               HR:           98 bpm. Exam Location:  Inpatient Procedure: 2D Echo and Strain Analysis (Both Spectral and Color Flow Doppler            were utilized during procedure). Indications:    Acute Myocardial Infarction  History:        Patient has prior history of Echocardiogram examinations. Risk                 Factors:Hypertension.  Sonographer:    Charmaine Gaskins Referring Phys: [id number] NKEIRUKA J EZENDUKA IMPRESSIONS  1. Left ventricular ejection fraction, by estimation, is 60 to 65%. Left ventricular ejection fraction by 2D MOD biplane is 65.5 %. The left ventricle has normal function. The left ventricle has no regional wall motion  abnormalities. There is mild concentric left ventricular hypertrophy. Left ventricular diastolic parameters were normal. The average left ventricular global longitudinal strain is -21.0 %. The global longitudinal strain is normal.  2. Right ventricular systolic function is normal. The right ventricular size is normal. Tricuspid regurgitation signal is inadequate for assessing PA pressure.  3. The mitral valve is grossly normal. Trivial mitral valve regurgitation. No evidence of mitral stenosis.  4. The aortic valve is tricuspid. Aortic valve regurgitation is not visualized. No aortic stenosis is present.  5. The inferior vena cava is normal in size with greater than 50% respiratory variability, suggesting right atrial pressure of 3 mmHg. FINDINGS  Left Ventricle: Left ventricular ejection fraction, by estimation, is 60 to 65%. Left ventricular ejection fraction by 2D MOD biplane is 65.5 %. The left ventricle has normal function. The left ventricle has no regional wall motion abnormalities. The average left ventricular global longitudinal strain is -21.0 %. Strain was performed and the global longitudinal strain is normal. The left ventricular internal cavity size was normal in size. There is mild concentric left ventricular hypertrophy. Left ventricular diastolic parameters were normal. Right Ventricle: The right ventricular size is normal. No increase in right ventricular wall thickness. Right ventricular systolic function is normal. Tricuspid regurgitation signal is inadequate for assessing PA pressure. Left Atrium: Left atrial size was normal in size. Right Atrium: Right atrial size was normal in size. Pericardium: There is no evidence of pericardial effusion. Mitral Valve: The mitral valve is grossly normal. Trivial mitral valve regurgitation. No evidence of mitral valve stenosis. Tricuspid Valve: The tricuspid valve is grossly normal. Tricuspid valve regurgitation is trivial. No evidence of tricuspid stenosis.  Aortic Valve: The aortic valve is tricuspid. Aortic valve regurgitation is not visualized. No aortic stenosis is present. Aortic valve mean gradient measures 5.0 mmHg. Aortic valve peak gradient measures 9.9 mmHg. Aortic valve area, by VTI measures 2.23 cm. Pulmonic Valve: The pulmonic valve was grossly normal. Pulmonic valve regurgitation is trivial. No evidence of pulmonic stenosis. Aorta: The aortic root and ascending aorta are structurally normal, with no evidence of dilitation. Venous: The inferior vena cava is normal in size with greater than 50% respiratory variability, suggesting right atrial pressure of 3 mmHg. IAS/Shunts: The atrial septum is grossly normal.  LEFT VENTRICLE PLAX 2D  Biplane EF (MOD) LVIDd:         4.07 cm         LV Biplane EF:   Left LVIDs:         2.40 cm                          ventricular LV PW:         1.15 cm                          ejection LV IVS:        1.10 cm                          fraction by LVOT diam:     2.05 cm                          2D MOD LV SV:         56                               biplane is LV SV Index:   30                               65.5 %. LVOT Area:     3.30 cm                                Diastology                                LV e' medial:    9.36 cm/s LV Volumes (MOD)               LV E/e' medial:  7.2 LV vol d, MOD    47.5 ml       LV e' lateral:   9.79 cm/s A2C:                           LV E/e' lateral: 6.9 LV vol d, MOD    51.4 ml A4C:                           2D Longitudinal LV vol s, MOD    15.3 ml       Strain A2C:                           2D Strain GLS   -21.0 % LV vol s, MOD    18.7 ml       Avg: A4C: LV SV MOD A2C:   32.2 ml LV SV MOD A4C:   51.4 ml LV SV MOD BP:    34.3 ml RIGHT VENTRICLE RV Basal diam:  2.50 cm RV Mid diam:    2.24 cm RV S prime:     14.30 cm/s LEFT ATRIUM             Index        RIGHT ATRIUM          Index  LA diam:        2.92 cm 1.56 cm/m   RA Area:     9.02 cm LA Vol (A2C):   33.5 ml  17.93 ml/m  RA Volume:   15.10 ml 8.08 ml/m LA Vol (A4C):   36.5 ml 19.53 ml/m LA Biplane Vol: 34.9 ml 18.68 ml/m  AORTIC VALVE AV Area (Vmax):    2.35 cm AV Area (Vmean):   2.38 cm AV Area (VTI):     2.23 cm AV Vmax:           157.00 cm/s AV Vmean:          107.000 cm/s AV VTI:            0.252 m AV Peak Grad:      9.9 mmHg AV Mean Grad:      5.0 mmHg LVOT Vmax:         112.00 cm/s LVOT Vmean:        77.000 cm/s LVOT VTI:          0.170 m LVOT/AV VTI ratio: 0.67  AORTA Ao Root diam: 3.34 cm Ao Asc diam:  3.13 cm MITRAL VALVE MV Area (PHT): 4.96 cm    SHUNTS MV Decel Time: 153 msec    Systemic VTI:  0.17 m MV E velocity: 67.60 cm/s  Systemic Diam: 2.05 cm MV A velocity: 89.10 cm/s MV E/A ratio:  0.76 Darryle Decent MD Electronically signed by Darryle Decent MD Signature Date/Time: 03/11/2024/9:20:19 AM    Final    CT Renal Stone Study Result Date: 03/10/2024 CLINICAL DATA:  Flank pain. EXAM: CT ABDOMEN AND PELVIS WITHOUT CONTRAST TECHNIQUE: Multidetector CT imaging of the abdomen and pelvis was performed following the standard protocol without IV contrast. RADIATION DOSE REDUCTION: This exam was performed according to the departmental dose-optimization program which includes automated exposure control, adjustment of the mA and/or kV according to patient size and/or use of iterative reconstruction technique. COMPARISON:  Oct 26, 2023 FINDINGS: Lower chest: No acute abnormality. Hepatobiliary: No focal liver abnormality is seen. No gallstones, gallbladder wall thickening, or biliary dilatation. Pancreas: Unremarkable. No pancreatic ductal dilatation or surrounding inflammatory changes. Spleen: Normal in size without focal abnormality. Adrenals/Urinary Tract: Adrenal glands are unremarkable. Kidneys are normal in size, without obstructing renal calculi or focal lesions. There is mild right-sided hydronephrosis. The urinary bladder is markedly distended and is otherwise unremarkable. Stomach/Bowel: Stomach is  within normal limits. Appendix appears normal. No evidence of bowel wall thickening, distention, or inflammatory changes. Vascular/Lymphatic: Aortic atherosclerosis. No enlarged abdominal or pelvic lymph nodes. Reproductive: Prostate is unremarkable. Other: No abdominal wall hernia or abnormality. No abdominopelvic ascites. Musculoskeletal: No acute or significant osseous findings. IMPRESSION: 1. Mild right-sided hydronephrosis without evidence of obstructing renal calculi. 2. Markedly distended urinary bladder. 3. Aortic atherosclerosis. Electronically Signed   By: Suzen Dials M.D.   On: 03/10/2024 12:04    Microbiology: Results for orders placed or performed during the hospital encounter of 06/21/23  Resp panel by RT-PCR (RSV, Flu A&B, Covid) Anterior Nasal Swab     Status: Abnormal   Collection Time: 06/21/23  6:44 PM   Specimen: Anterior Nasal Swab  Result Value Ref Range Status   SARS Coronavirus 2 by RT PCR NEGATIVE NEGATIVE Final   Influenza A by PCR POSITIVE (A) NEGATIVE Final   Influenza B by PCR NEGATIVE NEGATIVE Final    Comment: (NOTE) The Xpert Xpress SARS-CoV-2/FLU/RSV plus assay is intended as an aid in the diagnosis of influenza  from Nasopharyngeal swab specimens and should not be used as a sole basis for treatment. Nasal washings and aspirates are unacceptable for Xpert Xpress SARS-CoV-2/FLU/RSV testing.  Fact Sheet for Patients: BloggerCourse.com  Fact Sheet for Healthcare Providers: SeriousBroker.it  This test is not yet approved or cleared by the [place]  FDA and has been authorized for detection and/or diagnosis of SARS-CoV-2 by FDA under an Emergency Use Authorization (EUA). This EUA will remain in effect (meaning this test can be used) for the duration of the COVID-19 declaration under Section 564(b)(1) of the Act, 21 U.S.C. section 360bbb-3(b)(1), unless the authorization is terminated or revoked.      Resp Syncytial Virus by PCR NEGATIVE NEGATIVE Final    Comment: (NOTE) Fact Sheet for Patients: BloggerCourse.com  Fact Sheet for Healthcare Providers: SeriousBroker.it  This test is not yet approved or cleared by the [place]  FDA and has been authorized for detection and/or diagnosis of SARS-CoV-2 by FDA under an Emergency Use Authorization (EUA). This EUA will remain in effect (meaning this test can be used) for the duration of the COVID-19 declaration under Section 564(b)(1) of the Act, 21 U.S.C. section 360bbb-3(b)(1), unless the authorization is terminated or revoked.  Performed at [facility], [address], [place], [place] [address]     Labs: CBC: Recent Labs  Lab 03/10/24 1018 03/10/24 1201 03/11/24 0420 03/12/24 0422  WBC 11.8*  --  11.2* 7.6  HGB 14.2 14.6  15.0 13.4 13.4  HCT 44.7 43.0  44.0 42.5 41.9  MCV 81.0  --  80.8 79.7*  PLT 239  --  190 205   Basic Metabolic Panel: Recent Labs  Lab 03/10/24 1843 03/11/24 0304 03/11/24 0420 03/12/24 0422 03/13/24 0519 03/14/24 0418  NA 139 140  --  138 134* 134*  K 4.8 4.0  --  2.9* 3.4* 4.0  CL 105 105  --  103 97* 102  CO2 26 25  --  24 29 25   GLUCOSE 165* 154*  --  50* 101* 126*  BUN 37* 28*  --  19 13 19   CREATININE 1.19 1.04  --  0.85 1.00 1.17  CALCIUM  8.7* 8.7*  --  8.5* 8.4* 8.3*  MG  --   --  2.4  --  2.0  --    Liver Function Tests: Recent Labs  Lab 03/10/24 1018  AST 19  ALT 21  ALKPHOS 108  BILITOT 1.3*  PROT 7.6  ALBUMIN 3.9   CBG: Recent Labs  Lab 03/13/24 1052 03/13/24 1125 03/13/24 1622 03/13/24 2118 03/14/24 0755  GLUCAP 51* 187* 208* 154* 192*    Discharge time spent: greater than 30 minutes.  Signed: Nena Rebel, MD [facility] 03/14/2024

## 2024-03-14 NOTE — Plan of Care (Signed)

## 2024-03-14 NOTE — Plan of Care (Signed)
 Problem: Education: Goal: Ability to describe self-care measures that may prevent or decrease complications (Diabetes Survival Skills Education) will improve 03/14/2024 1133 by Marylu Randine NOVAK, RN Outcome: Adequate for Discharge 03/14/2024 1108 by Marylu Randine NOVAK, RN Outcome: Progressing Goal: Individualized Educational Video(s) 03/14/2024 1133 by Marylu Randine NOVAK, RN Outcome: Adequate for Discharge 03/14/2024 1108 by Marylu Randine NOVAK, RN Outcome: Progressing   Problem: Coping: Goal: Ability to adjust to condition or change in health will improve 03/14/2024 1133 by Marylu Randine NOVAK, RN Outcome: Adequate for Discharge 03/14/2024 1108 by Marylu Randine NOVAK, RN Outcome: Progressing   Problem: Fluid Volume: Goal: Ability to maintain a balanced intake and output will improve 03/14/2024 1133 by Marylu Randine NOVAK, RN Outcome: Adequate for Discharge 03/14/2024 1108 by Marylu Randine NOVAK, RN Outcome: Progressing   Problem: Health Behavior/Discharge Planning: Goal: Ability to identify and utilize available resources and services will improve 03/14/2024 1133 by Marylu Randine NOVAK, RN Outcome: Adequate for Discharge 03/14/2024 1108 by Marylu Randine NOVAK, RN Outcome: Progressing Goal: Ability to manage health-related needs will improve 03/14/2024 1133 by Marylu Randine NOVAK, RN Outcome: Adequate for Discharge 03/14/2024 1108 by Marylu Randine NOVAK, RN Outcome: Progressing   Problem: Metabolic: Goal: Ability to maintain appropriate glucose levels will improve 03/14/2024 1133 by Marylu Randine NOVAK, RN Outcome: Adequate for Discharge 03/14/2024 1108 by Marylu Randine NOVAK, RN Outcome: Progressing   Problem: Nutritional: Goal: Maintenance of adequate nutrition will improve 03/14/2024 1133 by Marylu Randine NOVAK, RN Outcome: Adequate for Discharge 03/14/2024 1108 by Marylu Randine NOVAK, RN Outcome: Progressing Goal: Progress toward achieving an optimal weight will improve 03/14/2024 1133 by Marylu Randine NOVAK, RN Outcome:  Adequate for Discharge 03/14/2024 1108 by Marylu Randine NOVAK, RN Outcome: Progressing   Problem: Skin Integrity: Goal: Risk for impaired skin integrity will decrease 03/14/2024 1133 by Marylu Randine NOVAK, RN Outcome: Adequate for Discharge 03/14/2024 1108 by Marylu Randine NOVAK, RN Outcome: Progressing   Problem: Tissue Perfusion: Goal: Adequacy of tissue perfusion will improve 03/14/2024 1133 by Marylu Randine NOVAK, RN Outcome: Adequate for Discharge 03/14/2024 1108 by Marylu Randine NOVAK, RN Outcome: Progressing   Problem: Education: Goal: Knowledge of General Education information will improve Description: Including pain rating scale, medication(s)/side effects and non-pharmacologic comfort measures 03/14/2024 1133 by Marylu Randine NOVAK, RN Outcome: Adequate for Discharge 03/14/2024 1108 by Marylu Randine NOVAK, RN Outcome: Progressing   Problem: Health Behavior/Discharge Planning: Goal: Ability to manage health-related needs will improve 03/14/2024 1133 by Marylu Randine NOVAK, RN Outcome: Adequate for Discharge 03/14/2024 1108 by Marylu Randine NOVAK, RN Outcome: Progressing   Problem: Clinical Measurements: Goal: Ability to maintain clinical measurements within normal limits will improve 03/14/2024 1133 by Marylu Randine NOVAK, RN Outcome: Adequate for Discharge 03/14/2024 1108 by Marylu Randine NOVAK, RN Outcome: Progressing Goal: Will remain free from infection 03/14/2024 1133 by Marylu Randine NOVAK, RN Outcome: Adequate for Discharge 03/14/2024 1108 by Marylu Randine NOVAK, RN Outcome: Progressing Goal: Diagnostic test results will improve 03/14/2024 1133 by Marylu Randine NOVAK, RN Outcome: Adequate for Discharge 03/14/2024 1108 by Marylu Randine NOVAK, RN Outcome: Progressing Goal: Respiratory complications will improve 03/14/2024 1133 by Marylu Randine NOVAK, RN Outcome: Adequate for Discharge 03/14/2024 1108 by Marylu Randine NOVAK, RN Outcome: Progressing Goal: Cardiovascular complication will be avoided 03/14/2024 1133  by Marylu Randine NOVAK, RN Outcome: Adequate for Discharge 03/14/2024 1108 by Marylu Randine NOVAK, RN Outcome: Progressing   Problem: Activity: Goal: Risk for activity intolerance will decrease 03/14/2024 1133 by Marylu Randine NOVAK, RN Outcome: Adequate for Discharge  03/14/2024 1108 by Marylu Randine NOVAK, RN Outcome: Progressing   Problem: Nutrition: Goal: Adequate nutrition will be maintained 03/14/2024 1133 by Marylu Randine NOVAK, RN Outcome: Adequate for Discharge 03/14/2024 1108 by Marylu Randine NOVAK, RN Outcome: Progressing   Problem: Coping: Goal: Level of anxiety will decrease 03/14/2024 1133 by Marylu Randine NOVAK, RN Outcome: Adequate for Discharge 03/14/2024 1108 by Marylu Randine NOVAK, RN Outcome: Progressing   Problem: Elimination: Goal: Will not experience complications related to bowel motility 03/14/2024 1133 by Marylu Randine NOVAK, RN Outcome: Adequate for Discharge 03/14/2024 1108 by Marylu Randine NOVAK, RN Outcome: Progressing Goal: Will not experience complications related to urinary retention 03/14/2024 1133 by Marylu Randine NOVAK, RN Outcome: Adequate for Discharge 03/14/2024 1108 by Marylu Randine NOVAK, RN Outcome: Progressing   Problem: Pain Managment: Goal: General experience of comfort will improve and/or be controlled 03/14/2024 1133 by Marylu Randine NOVAK, RN Outcome: Adequate for Discharge 03/14/2024 1108 by Marylu Randine NOVAK, RN Outcome: Progressing   Problem: Safety: Goal: Ability to remain free from injury will improve 03/14/2024 1133 by Marylu Randine NOVAK, RN Outcome: Adequate for Discharge 03/14/2024 1108 by Marylu Randine NOVAK, RN Outcome: Progressing   Problem: Skin Integrity: Goal: Risk for impaired skin integrity will decrease 03/14/2024 1133 by Marylu Randine NOVAK, RN Outcome: Adequate for Discharge 03/14/2024 1108 by Marylu Randine NOVAK, RN Outcome: Progressing   Problem: Education: Goal: Understanding of CV disease, CV risk reduction, and recovery process will improve 03/14/2024  1133 by Marylu Randine NOVAK, RN Outcome: Adequate for Discharge 03/14/2024 1108 by Marylu Randine NOVAK, RN Outcome: Progressing Goal: Individualized Educational Video(s) 03/14/2024 1133 by Marylu Randine NOVAK, RN Outcome: Adequate for Discharge 03/14/2024 1108 by Marylu Randine NOVAK, RN Outcome: Progressing   Problem: Activity: Goal: Ability to return to baseline activity level will improve 03/14/2024 1133 by Marylu Randine NOVAK, RN Outcome: Adequate for Discharge 03/14/2024 1108 by Marylu Randine NOVAK, RN Outcome: Progressing   Problem: Cardiovascular: Goal: Ability to achieve and maintain adequate cardiovascular perfusion will improve 03/14/2024 1133 by Marylu Randine NOVAK, RN Outcome: Adequate for Discharge 03/14/2024 1108 by Marylu Randine NOVAK, RN Outcome: Progressing Goal: Vascular access site(s) Level 0-1 will be maintained 03/14/2024 1133 by Marylu Randine NOVAK, RN Outcome: Adequate for Discharge 03/14/2024 1108 by Marylu Randine NOVAK, RN Outcome: Progressing   Problem: Health Behavior/Discharge Planning: Goal: Ability to safely manage health-related needs after discharge will improve 03/14/2024 1133 by Marylu Randine NOVAK, RN Outcome: Adequate for Discharge 03/14/2024 1108 by Marylu Randine NOVAK, RN Outcome: Progressing

## 2024-03-14 NOTE — Telephone Encounter (Unsigned)
 Copied from CRM [id number]. Topic: Clinical - Prescription Issue >> Mar 14, 2024  5:42 PM Delon T wrote: Reason for CRM: insulin  glargine-yfgn (SEMGLEE ) 100 UNIT/ML injection - hospital doctor changed to this insulin , insurance does not want to cover it- completely out of insulin - [phone]

## 2024-03-14 NOTE — Inpatient Diabetes Management (Signed)
 Inpatient Diabetes Program Recommendations  AACE/ADA: New Consensus Statement on Inpatient Glycemic Control (2015)  Target Ranges:  Prepandial:   less than 140 mg/dL      Peak postprandial:   less than 180 mg/dL (1-2 hours)      Critically ill patients:  140 - 180 mg/dL   Lab Results  Component Value Date   GLUCAP 82 03/14/2024   HGBA1C 12.7 (H) 03/11/2024    Review of Glycemic Control  Latest Reference Range & Units 03/13/24 10:52 03/13/24 11:25 03/13/24 16:22 03/13/24 21:18 03/14/24 07:55 03/14/24 11:48  Glucose-Capillary 70 - 99 mg/dL 51 (L) 812 (H) 791 (H) 154 (H) 192 (H) 82   Diabetes history: DM 2 Outpatient Diabetes medications:  Lantus  50 units daily Current orders for Inpatient glycemic control:  Semglee  25 units daily Novolog  0-9 units tid with meals   Inpatient Diabetes Program Recommendations:    Gave patient sample CGM- Dexcom G7 for d/c home.  He is familiar with how to use and has the app downloaded on his phone We reviewed importance of avoiding low blood sugars and treatment of hypoglycemia.  Also discussed use of Intranasal glucagon in case of severe low.  He lives with his brother states he will tell him about hypoglycemia treatment as well. Explained that the Baqsimi is for if CBG drops and he is unable to eat/drink to treat.  Baqsimi can be given and 911 called for severe low blood sugar.  Patient appreciative of information.   Thanks,  Randall Bullocks, RN, BC-ADM Inpatient Diabetes Coordinator Pager [phone]  (8a-5p) Discharge Recommendations: Other recommendations: Needs Prescription for Dexcom G7 sensors- [id number] Long acting recommendations: Insulin  Glargine (LANTUS ) Solostar Pen 25 units daily  Short acting recommendations:  Correction coverage ONLY Insulin  aspart (NOVOLOG ) FlexPen  Sensitive Scale.   Hypoglycemia treatment recommendations: Baqsimi 3mg    Use Adult Diabetes Insulin  Treatment Post Discharge order set.

## 2024-03-17 ENCOUNTER — Telehealth (HOSPITAL_COMMUNITY): Payer: Self-pay

## 2024-03-17 ENCOUNTER — Emergency Department (HOSPITAL_COMMUNITY)

## 2024-03-17 ENCOUNTER — Ambulatory Visit: Payer: Self-pay

## 2024-03-17 ENCOUNTER — Other Ambulatory Visit (HOSPITAL_COMMUNITY): Payer: Self-pay

## 2024-03-17 ENCOUNTER — Encounter (HOSPITAL_COMMUNITY): Payer: Self-pay

## 2024-03-17 ENCOUNTER — Telehealth: Payer: Self-pay

## 2024-03-17 ENCOUNTER — Other Ambulatory Visit: Payer: Self-pay

## 2024-03-17 ENCOUNTER — Emergency Department (HOSPITAL_COMMUNITY)
Admission: EM | Admit: 2024-03-17 | Discharge: 2024-03-17 | Disposition: A | Discharge status: Home / Self Care | Attending: Emergency Medicine | Admitting: Emergency Medicine

## 2024-03-17 DIAGNOSIS — I808 Phlebitis and thrombophlebitis of other sites: Secondary | ICD-10-CM

## 2024-03-17 DIAGNOSIS — I82611 Acute embolism and thrombosis of superficial veins of right upper extremity: Secondary | ICD-10-CM | POA: Diagnosis not present

## 2024-03-17 DIAGNOSIS — M7989 Other specified soft tissue disorders: Secondary | ICD-10-CM | POA: Diagnosis not present

## 2024-03-17 DIAGNOSIS — L02413 Cutaneous abscess of right upper limb: Secondary | ICD-10-CM | POA: Diagnosis present

## 2024-03-17 DIAGNOSIS — Z794 Long term (current) use of insulin: Secondary | ICD-10-CM | POA: Insufficient documentation

## 2024-03-17 DIAGNOSIS — Z87891 Personal history of nicotine dependence: Secondary | ICD-10-CM | POA: Insufficient documentation

## 2024-03-17 DIAGNOSIS — L02419 Cutaneous abscess of limb, unspecified: Secondary | ICD-10-CM

## 2024-03-17 DIAGNOSIS — E1165 Type 2 diabetes mellitus with hyperglycemia: Secondary | ICD-10-CM | POA: Diagnosis not present

## 2024-03-17 DIAGNOSIS — Z7982 Long term (current) use of aspirin: Secondary | ICD-10-CM | POA: Diagnosis not present

## 2024-03-17 LAB — CBC WITH DIFFERENTIAL/PLATELET
Abs Immature Granulocytes: 0.02 K/uL (ref 0.00–0.07)
Basophils Absolute: 0.1 K/uL (ref 0.0–0.1)
Basophils Relative: 1 %
Eosinophils Absolute: 0.3 K/uL (ref 0.0–0.5)
Eosinophils Relative: 3 %
HCT: 40.9 % (ref 39.0–52.0)
Hemoglobin: 12 g/dL — ABNORMAL LOW (ref 13.0–17.0)
Immature Granulocytes: 0 %
Lymphocytes Relative: 33 %
Lymphs Abs: 2.8 K/uL (ref 0.7–4.0)
MCH: 24.7 pg — ABNORMAL LOW (ref 26.0–34.0)
MCHC: 29.3 g/dL — ABNORMAL LOW (ref 30.0–36.0)
MCV: 84.2 fL (ref 80.0–100.0)
Monocytes Absolute: 0.9 K/uL (ref 0.1–1.0)
Monocytes Relative: 11 %
Neutro Abs: 4.4 K/uL (ref 1.7–7.7)
Neutrophils Relative %: 52 %
Platelets: 288 K/uL (ref 150–400)
RBC: 4.86 MIL/uL (ref 4.22–5.81)
RDW: 13.4 % (ref 11.5–15.5)
WBC: 8.4 K/uL (ref 4.0–10.5)
nRBC: 0 % (ref 0.0–0.2)

## 2024-03-17 LAB — COMPREHENSIVE METABOLIC PANEL WITH GFR
ALT: 23 U/L (ref 0–44)
AST: 31 U/L (ref 15–41)
Albumin: 4 g/dL (ref 3.5–5.0)
Alkaline Phosphatase: 86 U/L (ref 38–126)
Anion gap: 9 (ref 5–15)
BUN: 9 mg/dL (ref 6–20)
CO2: 21 mmol/L — ABNORMAL LOW (ref 22–32)
Calcium: 8.9 mg/dL (ref 8.9–10.3)
Chloride: 104 mmol/L (ref 98–111)
Creatinine, Ser: 0.95 mg/dL (ref 0.61–1.24)
GFR, Estimated: >60 mL/min (ref >60)
Glucose, Bld: 340 mg/dL — ABNORMAL HIGH (ref 70–99)
Potassium: 5.3 mmol/L — ABNORMAL HIGH (ref 3.5–5.1)
Sodium: 135 mmol/L (ref 135–145)
Total Bilirubin: 0.4 mg/dL (ref 0.0–1.2)
Total Protein: 7.5 g/dL (ref 6.5–8.1)

## 2024-03-17 LAB — CBG MONITORING, ED
Glucose-Capillary: 281 mg/dL — ABNORMAL HIGH (ref 70–99)
Glucose-Capillary: 328 mg/dL — ABNORMAL HIGH (ref 70–99)
Glucose-Capillary: 309 mg/dL — ABNORMAL HIGH (ref 70–99)

## 2024-03-17 MED ORDER — DOXYCYCLINE HYCLATE 100 MG PO TABS
100.0000 mg | ORAL_TABLET | Freq: Once | ORAL | Status: AC
  Administered 2024-03-17: 100 mg via ORAL
  Filled 2024-03-17: qty 1

## 2024-03-17 MED ORDER — LANTUS SOLOSTAR 100 UNIT/ML ~~LOC~~ SOPN: 25.0000 [IU] | PEN_INJECTOR | Freq: Every day | SUBCUTANEOUS | 99 refills | Status: AC

## 2024-03-17 MED ORDER — DOXYCYCLINE HYCLATE 100 MG PO CAPS
100.0000 mg | ORAL_CAPSULE | Freq: Two times a day (BID) | ORAL | 0 refills | Status: DC
Start: 2024-03-17 — End: 2024-03-24

## 2024-03-17 MED ORDER — INSULIN ASPART PROT & ASPART (70-30 MIX) 100 UNIT/ML ~~LOC~~ SUSP
7.0000 [IU] | Freq: Once | SUBCUTANEOUS | Status: AC
  Administered 2024-03-17: 7 [IU] via SUBCUTANEOUS
  Filled 2024-03-17: qty 10

## 2024-03-17 NOTE — ED Notes (Signed)
 Pt complained of wait  time.  Informed provider.  Informed pt that provider is waiting on labs.

## 2024-03-17 NOTE — ED Provider Triage Note (Signed)
 Emergency Medicine Provider Triage Evaluation Note  GLYNDON TURSI , a 53 y.o. male  was evaluated in triage.  Pt complains of RUE AC wound. Patient was hospitalized last week for complications of diabetes, when he was discharged on Friday he noted a painful sore to his R [facility] which has progressively worsened, he believes this is an infection from where his IV was placed. He endorses a fever of 103 last night for which he took Tylenol  and it resolved. He states he is still working on getting his insulin  straightened out, no other complaints.  Review of Systems  Positive: As above  Negative: As above  Physical Exam  BP (!) 155/98 (BP Location: Left Arm)   Pulse 84   Temp 98.2 F (36.8 C) (Oral)   Resp 16   SpO2 100%  Gen:   Awake, no distress   Resp:  Normal effort  MSK:   Moves extremities without difficulty  Other:  ~2-3cm Erythematous area of warmth/fluctuance noted to R AC, not actively draining but with small opening, 2+ radial pulse  Medical Decision Making  Medically screening exam initiated at 12:25 PM.  Appropriate orders placed.  CALIN FANTROY was informed that the remainder of the evaluation will be completed by another provider, this initial triage assessment does not replace that evaluation, and the importance of remaining in the ED until their evaluation is complete.     Glendia Rocky SAILOR, [place] 03/17/24 1228

## 2024-03-17 NOTE — ED Provider Notes (Signed)
 [place] EMERGENCY DEPARTMENT AT [facility] Provider Note   CSN: [id number] Arrival date & time: 03/17/24  9053     Patient presents with: Wound Infection   Joshua Escobar is a 53 y.o. male.   53 year old male presenting with wound infection.  Patient complains of RUE AC wound, was hospitalized last week for complications of diabetes, when he was discharged on Friday he noted a painful sore to his R [facility] which has progressively worsened, he believes this is an infection from where his IV was placed. He endorses a fever last night of either 100.3 or 103 for which he took Tylenol  and it resolved, he has not taken Tylenol /Ibuprofen today and his fever has not returned. He reports that he has not taken any of his insulin  today. He is not currently on antibiotics.            Prior to Admission medications   Medication Sig Start Date End Date Taking? Authorizing Provider  aspirin  EC 81 MG tablet Take 1 tablet (81 mg total) by mouth daily. Swallow whole. Patient not taking: Reported on 03/11/2024 11/07/23   Elmira Newman PARAS, MD  cholestyramine  (QUESTRAN ) 4 g packet Take 1 packet (4 g total) by mouth at bedtime. 07/03/23   Duanne Butler DASEN, MD  Continuous Glucose Sensor (DEXCOM G7 SENSOR) MISC Use as directed. 03/14/24   Roann Gouty, MD  Continuous Glucose Sensor (FREESTYLE LIBRE 3 PLUS SENSOR) MISC Inject 1 each into the skin daily. 11/07/23   [provider]  Glucagon (BAQSIMI ONE PACK) 3 MG/DOSE POWD Place 3 mg into the nose once as needed for up to 1 dose (low blood sugar). 03/14/24   Roann Gouty, MD  insulin  glargine (LANTUS  SOLOSTAR) 100 UNIT/ML Solostar Pen Inject 25 Units into the skin daily. Inject 0.25mLs (25 Units total) into the skin at bedtime 03/17/24   Duanne Butler DASEN, MD  insulin  lispro (HUMALOG  KWIKPEN) 100 UNIT/ML KwikPen Inject 3 Units into the skin 3 (three) times daily. 03/14/24   Paudel, Keshab, MD  Insulin  Pen Needle 32G X 4 MM MISC Use  to inject insulin  into skin as directed. 03/14/24   Duanne Butler DASEN, MD  meclizine  (ANTIVERT ) 25 MG tablet Take 1 tablet (25 mg total) by mouth 3 (three) times daily as needed for dizziness (vertigo). 10/05/23   Duanne Butler DASEN, MD  nystatin (MYCOSTATIN/NYSTOP) powder Apply topically 2 (two) times daily. 03/14/24   Roann Gouty, MD  ondansetron  (ZOFRAN ) 4 MG tablet Take 1 tablet (4 mg total) by mouth every 6 (six) hours as needed for up to 30 doses for nausea or vomiting. 10/27/23   Jerral Meth, MD  pantoprazole  (PROTONIX ) 40 MG tablet Take 1 tablet (40 mg total) by mouth 2 (two) times daily before a meal. 03/14/24 03/14/25  Roann Gouty, MD  polyethylene glycol (MIRALAX  / GLYCOLAX ) 17 g packet Take 17 g by mouth daily as needed for mild constipation. 03/14/24   Roann Gouty, MD  rosuvastatin  (CRESTOR ) 10 MG tablet Take 1 tablet (10 mg total) by mouth daily. 03/14/24 03/14/25  Roann Gouty, MD    Allergies: Patient has no known allergies.    Review of Systems  Updated Vital Signs  Vitals:   03/17/24 0949 03/17/24 1335 03/17/24 1338 03/17/24 1736  BP: (!) 155/98 105/75  (!) 133/113  Pulse: 84 95  84  Resp: 16 16  18   Temp: 98.2 F (36.8 C)  98.2 F (36.8 C) 98.6 F (37 C)  TempSrc: Oral  Oral Oral  SpO2: 100% 98%  99%     Physical Exam Vitals and nursing note reviewed.  HENT:     Head: Normocephalic.  Eyes:     Extraocular Movements: Extraocular movements intact.  Cardiovascular:     Rate and Rhythm: Normal rate.     Pulses:          Radial pulses are 2+ on the right side and 2+ on the left side.  Pulmonary:     Effort: Pulmonary effort is normal.  Musculoskeletal:     Cervical back: Normal range of motion.     Comments: Moves all extremities spontaneously without difficulty RUE: Full ROM at the elbow, joint space is without erythema/warmth   Skin:    General: Skin is warm and dry.     Comments: RUE: 2-3cm area of erythema/warmth/fluctuance to R AC,  minimal opening without drainage.  Neurological:     Mental Status: He is alert and oriented to person, place, and time.     (all labs ordered are listed, but only abnormal results are displayed) Labs Reviewed  CBC WITH DIFFERENTIAL/PLATELET - Abnormal; Notable for the following components:      Result Value   Hemoglobin 12.0 (*)    MCH 24.7 (*)    MCHC 29.3 (*)    All other components within normal limits  COMPREHENSIVE METABOLIC PANEL WITH GFR - Abnormal; Notable for the following components:   Potassium 5.3 (*)    CO2 21 (*)    Glucose, Bld 340 (*)    All other components within normal limits  CBG MONITORING, ED - Abnormal; Notable for the following components:   Glucose-Capillary 281 (*)    All other components within normal limits    EKG: None  Radiology: No results found.   Procedures   Medications Ordered in the ED - No data to display                                  Medical Decision Making This patient presents to the ED for concern of wound infection, this involves an extensive number of treatment options, and is a complaint that carries with it a high risk of complications and morbidity.  The differential diagnosis includes abscess vs cellulitis vs erysipelas, DVT vs superficial thrombophlebitis, uncontrolled T2DM   Co morbidities that complicate the patient evaluation  T2DM with recent hospitalization for DKA   Additional history obtained:  Additional history obtained from record review External records from outside source obtained and reviewed including previous hospital discharge summary   Lab Tests:  I Ordered, and personally interpreted labs.  The pertinent results include: CBC notable for hemoglobin of 12 which is reduced from most recent baseline of 13.4, no leukocytosis. CMP notable for potassium of 5.3 which is elevated from recent baseline of 4, with hyperglycemia in the 340's, normal anion gap. CBG improved to ~300 after administration of  insulin .   Imaging Studies ordered:  I ordered imaging studies including RUE US   I independently visualized and interpreted imaging which showed  Right: No evidence of deep vein thrombosis in the upper extremity. Findings consistent with acute superficial vein thrombosis involving a short segment of the right cephalic vein at the [facility] extending into the distal upper arm. Enlarged lymph node noted in the distal upper arm. Mixed echoes noted at [facility] at site of concern, possibly consistent with soft tissue infection. Further imaging may be warranted  if clinically indicated. Left: No evidence of thrombosis in the subclavian.  I agree with the radiologist interpretation   Cardiac Monitoring: / EKG:  The patient was maintained on a cardiac monitor.  I personally viewed and interpreted the cardiac monitored which showed an underlying rhythm of: NSR   Problem List / ED Course / Critical interventions / Medication management  I ordered medication including subQ insulin  for hyperglycemia and doxycycline   for suspected abscess  Reevaluation of the patient after these medicines showed that the patient improved I have reviewed the patients home medicines and have made adjustments as needed   Social Determinants of Health:  Former tobacco use    Test / Admission - Considered:  Physical exam is notable as above.  Patient does have what appears to be the beginning of an abscess forming to his right [facility], there is full range of motion at the right elbow and does not appear to be any extension of the infection into the joint itself, as there is no erythema/warmth/swelling of the olecranon/joint space. Labs are reassuring, no leukocytosis, he is afebrile and without tachycardia, low suspicion for systemic illness based on these reassuring findings. Patient was mildly hyperkalemic, however creatinine is normal, I suspect that his potassium will continue to normalize with administration of insulin , as he was  found to be hypokalemic during recent hospitalization. US  notable for findings consistent with soft tissue skin infection as above, as well as lymphadenopathy and superficial thrombophlebitis, no DVT.  Patient was recently hospitalized with DKA and was found to be hyperglycemic in the emergency department today, however labs are reassuring with a normal anion gap, I do not suspect that he is in DKA at this time but he does admit that he did not take his insulin  prior to coming to the emergency department today, 7 units of subcutaneous insulin  was administered and CBG did improve.  I discussed the patient's insulin  regimen in depth with him, he states that he is going to the pharmacy once he is discharged to pick up the remainder of his insulin  so that he can get back on his regimen as previously directed at the time of his hospital discharge. Will start doxycycline  for suspected R AC abscess, unfortunately due to location of abscess/proximity to significant vasculature I do not feel that I&D would be appropriate. I recommend that patient follow-up with his PCP this week for a wound check, he voiced understanding and is in agreement with this plan. Strict return precautions discussed.    Amount and/or Complexity of Data Reviewed Labs: ordered.  Risk Prescription drug management.        Final diagnoses:  Abscess of antecubital fossa  Superficial thrombophlebitis of right upper extremity    ED Discharge Orders          Ordered    doxycycline  (VIBRAMYCIN ) 100 MG capsule  2 times daily        03/17/24 [address], [place] 03/17/24 1828    Franklyn Sid SAILOR, MD 03/18/24 [phone]

## 2024-03-17 NOTE — Telephone Encounter (Signed)
 Triaged to ED, pt refused. Called CAL and confirmed recommendation for ED/cannot be seen in office. This RN provided rationale for pt likely requiring a higher level of intervention than available in the primary care setting, high r/o severe infection d/t DM. Unclear if pt will comply.  FYI Only or Action Required?: FYI only for provider.  Patient was last seen in primary care on 01/15/2024 by Aletha Bene, MD.  Called Nurse Triage reporting Blood Sugar Problem and Wound Infection.  Symptoms began several days ago.  Interventions attempted: Nothing.  Symptoms are: rapidly worsening.  Triage Disposition: Call PCP Now, Go to ED or PCP/Alternative with Approval  Patient/caregiver understands and will follow disposition?:   Reason for Disposition  Black (necrotic) or blisters develop in wound  Patient sounds very sick or weak to the triager  [1] Blood glucose > 300 mg/dL (83.2 mmol/L) AND [7] two or more times in a row  Answer Assessment - Initial Assessment Questions Patient reports that he has been out of his insulin  x1 week. Was in the hospital on 03/08/24-03/14/24. States insurance will not cover the insulin  that he was d/c with. Requesting to be prescribed a different insulin . States BS today is 372 fasted and reports polyuria.   Also reports infection at IV site. Described as 2 holes with black around it and it has yellow stuff coming out of it. Temp has been as high as 103 F.   1. LOCATION: Where is the wound located?      Right AC  2. WOUND APPEARANCE: What does the wound look like?      Red, swollen, black and with prurulent discharge.  3. ONSET: When did it start to look infected?      03/15/24  6. MECHANISM: How did the wound start, what was the cause?     At IV site  7. FEVER: Do you have a fever? If Yes, ask: What is your temperature, how was it measured, and when did it start?     103 F  8. OTHER SYMPTOMS: Do you have any other symptoms? (e.g.,  shaking chills, weakness, rash elsewhere on body)     Chills, fever  Answer Assessment - Initial Assessment Questions 1. BLOOD GLUCOSE: What is your blood glucose level?      372 fasted  2. ONSET: When did you check the blood glucose?     This morning  3. INSULIN : Do you take insulin ? What type of insulin (s) do you use? What is the mode of delivery? (syringe, pen; injection or pump)?      States has been out x 1 week  4. OTHER SYMPTOMS: Do you have any symptoms? (e.g., fever, frequent urination, difficulty breathing, dizziness, weakness, vomiting)     Polyuria  Protocols used: Wound Infection-A-AH, Diabetes - High Blood Sugar-A-AH Copied from CRM #[id number]. Topic: Clinical - Red Word Triage >> Mar 17, 2024  8:01 AM Joshua Escobar wrote: Red Word that prompted transfer to Nurse Triage: Patient is calling to report that he has been without insulin  since Friday - sugars running 300-400. Reporting that he right arm is infected. IV in arm in the hospital. Reporting pain, swelling with yellow puss coming out of arm. Scared to return back to the hospital.  Fever chills last night.

## 2024-03-17 NOTE — ED Triage Notes (Signed)
 Pt reports with a wound to a right ac iv site. Pt reports that it started looking like that on Friday. Pt recently hospitalized.

## 2024-03-17 NOTE — ED Notes (Signed)
 Tried to get vitals, pt outside at this time.

## 2024-03-17 NOTE — Telephone Encounter (Signed)
 Pt is currently at ED. Mychart msg sent.

## 2024-03-17 NOTE — Progress Notes (Signed)
 VASCULAR LAB    Right upper extremity venous duplex has been performed.  See CV proc for preliminary results.  Gave verbal report   Kanani Mowbray, RVT 03/17/2024, 4:55 PM

## 2024-03-17 NOTE — Discharge Instructions (Addendum)
 Follow-up with your PCP this week for a wound check.  Start doxycycline , 1 tablet by mouth twice daily for 7 days, you were given the first dose of this antibiotic in the emergency department today. Continue your insulin  regimen as previously directed.  Continue ibuprofen/Tylenol  as needed for pain. Return to the emergency department if your symptoms worsen.

## 2024-03-17 NOTE — Telephone Encounter (Signed)
 FYI Only or Action Required?: FYI only for provider.  Patient was last seen in primary care on 01/15/2024 by Aletha Bene, MD.  Called Nurse Triage reporting Blood Sugar Problem.  Symptoms began several days ago.  Interventions attempted: Other: in ED.  Symptoms are: stable.  Triage Disposition: Information or Advice Only Call  Patient/caregiver understands and will follow disposition?: Yes Reason for Disposition  [1] Caller is not with the adult (patient) AND [2] probable NON-URGENT symptoms  Answer Assessment - Initial Assessment Questions Myles Shuck PCP cannot go into the ED to evaluate patient, and cannot be seen in PCP office without appointment, and none are available because the office closes in 1 hour. Myles Shuck that patient is in ED and has had blood work and blood sugar checks done and will be evaluated and treated by the physician there. Shuck stated  this makes no sense and hung up.  1. REASON FOR CALL: What is the main reason for your call? or How can I best help you?     Patient's sister Shuck calling due to patient being at ED all day and not being seen. Wanted Dr. Duanne to go to ED to see patient or have patient go to PCP office today.  2. SYMPTOMS : Do you have any symptoms?      Elevated Blood sugar, possible infection from post IV site.  Protocols used: Information Only Call - No Triage-A-AH  Copied from CRM [id number]. Topic: Clinical - Red Word Triage >> Mar 17, 2024  3:39 PM Olam RAMAN wrote: Red Word that prompted transfer to Nurse Triage: Same issue as earlier. Reconfirmed Patient is calling to report that he has been without insulin  since Friday - sugars running 300-400.  Reporting that he right arm is infected. IV in arm in the hospital. Reporting pain, swelling with yellow puss coming out of arm. Scared to return back to the hospital.  Fever chills last night.  Pt is at hospital since 8 am and has not been seen yet

## 2024-03-17 NOTE — Telephone Encounter (Signed)
 Pt currently at ED. Mychart msg sent.

## 2024-03-17 NOTE — Telephone Encounter (Signed)
 Copied from CRM #[id number]. Topic: Clinical - Prescription Issue >> Mar 17, 2024  9:56 AM Pinkey ORN wrote: Reason for CRM: Insulin  >> Mar 17, 2024  9:57 AM Pinkey ORN wrote: Patient states he still hasn't been able to get his insulin . States he went all weekend without it, please follow up with patient.

## 2024-03-17 NOTE — Telephone Encounter (Signed)
 Pharmacy Patient Advocate Encounter  Insurance verification completed.    The patient is insured through [facility]. Patient has [employer], may use a copay card, and/or apply for patient assistance if available.    Ran test claim for Novolog  100unit pen and the current 30 day co-pay is $27.01  Ran test claim for Lantus  100unit pen and the current 30 day co-pay is $18.67  This test claim was processed through [employer]- copay amounts may vary at other pharmacies due to [employer], or as the patient moves through the different stages of their insurance plan.

## 2024-03-19 NOTE — Telephone Encounter (Signed)
 Error

## 2024-03-20 ENCOUNTER — Telehealth: Payer: Self-pay | Admitting: Pharmacy Technician

## 2024-03-20 ENCOUNTER — Other Ambulatory Visit (HOSPITAL_COMMUNITY): Payer: Self-pay

## 2024-03-20 NOTE — Telephone Encounter (Signed)
 Pharmacy Patient Advocate Encounter   Received notification from Onbase that prior authorization for FreeStyle Libre 3 Plus Sensor is required/requested.   Insurance verification completed.   The patient is insured through [facility].   Per test claim: PA required; PA submitted to above mentioned insurance via Latent Key/confirmation #/EOC [facility] Status is pending

## 2024-03-20 NOTE — Telephone Encounter (Signed)
 Pharmacy Patient Advocate Encounter  Received notification from [employer] that Prior Authorization for FreeStyle Libre 3 Plus Sensor has been DENIED.  Full denial letter will be uploaded to the media tab. See denial reason below.    PA #/Case ID/Reference #: [id number]

## 2024-03-24 ENCOUNTER — Encounter: Payer: Self-pay | Admitting: Family Medicine

## 2024-03-24 ENCOUNTER — Ambulatory Visit: Admitting: Family Medicine

## 2024-03-24 VITALS — BP 138/84 | HR 88 | Temp 98.3°F | Ht 68.0 in | Wt 145.0 lb

## 2024-03-24 DIAGNOSIS — E119 Type 2 diabetes mellitus without complications: Secondary | ICD-10-CM

## 2024-03-24 DIAGNOSIS — Z794 Long term (current) use of insulin: Secondary | ICD-10-CM

## 2024-03-24 DIAGNOSIS — L02413 Cutaneous abscess of right upper limb: Secondary | ICD-10-CM

## 2024-03-24 MED ORDER — DOXYCYCLINE HYCLATE 100 MG PO CAPS: 100.0000 mg | ORAL_CAPSULE | Freq: Two times a day (BID) | ORAL | 0 refills | Status: AC

## 2024-03-24 NOTE — Progress Notes (Signed)
 Subjective:    Patient ID: Joshua Escobar, male    DOB: 05-Apr-1971, 53 y.o.   MRN: [id number]  HPI Admit date:     03/10/2024  Discharge date: 03/14/24  Discharge Physician: Nena Rebel    PCP: Duanne Butler DASEN, MD    Recommendations at discharge:    Continue insulin  Lantus  and sliding scale Continue Protonix  oral twice a day Continue aspirin  and rosuvastatin  Follow-up with cardiology as scheduled   Discharge Diagnoses: Active Problems:   DKA (diabetic ketoacidosis) (HCC)   Resolved Problems:   * No resolved hospital problems. *   Hospital Course: Joshua Escobar is a 53 y.o. male with past medical history of type 2 diabetes with left-sided flank pain, nausea and vomiting for the last 2 to 3 days PTA, with some mild urgency and frequency.  Very noncompliant with his insulin  regimen.  In the ED, vital signs fairly stable. Labs were notable for i-STAT glucose of 500, creatinine elevated at 1.6.  Anion gap of 20, with noted acidosis, bilirubin slightly elevated 1.3, Beta hydroxybutyrate was elevated. CT renal study showed mild right-sided hydronephrosis without any renal calculi with distended urinary bladder.  UA negative for infection.  Patient admitted for further management.       Early this a.m., patient noted to have significant 10/10 left-sided chest pain, of which he describes as squeezing, with associated nausea but denies any vomiting.  Patient does report some generalized abdominal tenderness.  Cardiology consulted for concerning EKG changes.   Assessment and Plan:   DKA Diabetes mellitus type 2, uncontrolled with hyperglycemia Patient noncompliant Last A1c 12.7 S/p insulin  drip, switched to SSI, Lantus , Accu-Cheks, hypoglycemic protocol Patient was not using any medication at home for more than a week.  He was given initially Lantus  30 units twice daily but he ended up getting hypoglycemic incident.  Decreased Lantus  to 20 units twice daily again he ended up  having hypoglycemic incident.  Later discussed with the patient and patient advised that he was not using Lantus  regularly.  Now he has been placed on 25 units of Lantus  at bedtime with sliding scale.  Extensive counseling was done regarding compliance.   Left-sided chest pain History of nonobstructive CAD HLD Possible cardiac, in the setting of uncontrolled DM Troponin 5-->5, flat trend EKG showed ST changes concerning for anterior septal infarct Echo showed EF of 60 to 65%, no regional wall motion abnormality Start ASA, nitroglycerin  SL, statins, initially treated with heparin  S/p LHC, nonobstructive coronaries, advised aspirin  and Crestor    Generalized abdominal pain Workup negative, improved likely GERD placed on Protonix  oral   Alcohol abuse Advised to quit   Tobacco abuse Advised to quit   Hypokalemia  Replaced, resolved   Left groin fungal infection Nystatin powder has been ordered.  Improved he will continue put on nystatin at home.       03/24/24 Patient is here today for follow-up.  He is currently taking 25 units of Lantus  in the morning and 3 to 5 units of Humalog  with meals.  He states that his 2-hour postprandial sugars are typically under 170 however his fasting sugar in the morning is around 220.  He also complains of pain and soreness around the abscess on his right antecubital fossa.  There is a 3 cm x 2 cm subcutaneous hematoma where the patient previously had an IV.  He was started on doxycycline  in the emergency room and he states that the redness and swelling has improved but it  is still extremely tender to the touch.  He is requesting incision and drainage. Past Medical History:  Diagnosis Date   MI (myocardial infarction) (HCC) 10/2019   Migraine headache    Type II diabetes mellitus (HCC)    Past Surgical History:  Procedure Laterality Date   BIOPSY  11/16/2020   Procedure: BIOPSY;  Surgeon: Eartha Angelia Sieving, MD;  [facility]: [facility];   Service: Gastroenterology;;   COLONOSCOPY WITH PROPOFOL  N/A 09/11/2020   Procedure: COLONOSCOPY WITH PROPOFOL ;  Surgeon: Eartha Angelia Sieving, MD;  Location: [facility];  Service: Gastroenterology;  Laterality: N/A;   ESOPHAGOGASTRODUODENOSCOPY (EGD) WITH PROPOFOL  N/A 11/16/2020   Procedure: ESOPHAGOGASTRODUODENOSCOPY (EGD) WITH PROPOFOL ;  Surgeon: Eartha Angelia Sieving, MD;  Location: [facility];  Service: Gastroenterology;  Laterality: N/A;  12:10   LEFT HEART CATH AND CORONARY ANGIOGRAPHY N/A 11/27/2016   Procedure: Left Heart Cath and Coronary Angiography;  Surgeon: Court Dorn PARAS, MD;  Location: [facility] INVASIVE CV LAB;  Service: Cardiovascular;  Laterality: N/A;   LEFT HEART CATH AND CORONARY ANGIOGRAPHY N/A 03/11/2024   Procedure: LEFT HEART CATH AND CORONARY ANGIOGRAPHY;  Surgeon: Elmira Newman PARAS, MD;  Location: [facility] INVASIVE CV LAB;  Service: Cardiovascular;  Laterality: N/A;   Current Outpatient Medications on File Prior to Visit  Medication Sig Dispense Refill   aspirin  EC 81 MG tablet Take 1 tablet (81 mg total) by mouth daily. Swallow whole. (Patient not taking: Reported on 03/11/2024)     cholestyramine  (QUESTRAN ) 4 g packet Take 1 packet (4 g total) by mouth at bedtime. 30 each 12   Continuous Glucose Sensor (DEXCOM G7 SENSOR) MISC Use as directed. 3 each 0   Continuous Glucose Sensor (FREESTYLE LIBRE 3 PLUS SENSOR) MISC Inject 1 each into the skin daily.     doxycycline  (VIBRAMYCIN ) 100 MG capsule Take 1 capsule (100 mg total) by mouth 2 (two) times daily for 7 days. 13 capsule 0   Glucagon (BAQSIMI ONE PACK) 3 MG/DOSE POWD Place 3 mg into the nose once as needed for up to 1 dose (low blood sugar). 6 each 0   insulin  glargine (LANTUS  SOLOSTAR) 100 UNIT/ML Solostar Pen Inject 25 Units into the skin daily. Inject 0.25mLs (25 Units total) into the skin at bedtime 15 mL PRN   insulin  lispro (HUMALOG  KWIKPEN) 100 UNIT/ML KwikPen Inject 3 Units into the skin 3 (three) times  daily. 15 mL 11   Insulin  Pen Needle 32G X 4 MM MISC Use to inject insulin  into skin as directed. 100 each 0   meclizine  (ANTIVERT ) 25 MG tablet Take 1 tablet (25 mg total) by mouth 3 (three) times daily as needed for dizziness (vertigo). 30 tablet 0   nystatin (MYCOSTATIN/NYSTOP) powder Apply topically 2 (two) times daily. 15 g 0   ondansetron  (ZOFRAN ) 4 MG tablet Take 1 tablet (4 mg total) by mouth every 6 (six) hours as needed for up to 30 doses for nausea or vomiting. 30 tablet 0   pantoprazole  (PROTONIX ) 40 MG tablet Take 1 tablet (40 mg total) by mouth 2 (two) times daily before a meal. 30 tablet 1   polyethylene glycol (MIRALAX  / GLYCOLAX ) 17 g packet Take 17 g by mouth daily as needed for mild constipation. 14 each 0   rosuvastatin  (CRESTOR ) 10 MG tablet Take 1 tablet (10 mg total) by mouth daily. 30 tablet 11   No current facility-administered medications on file prior to visit.   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    Social Drivers of Corporate Investment Banker Strain: Low Risk  (06/08/2023)   Overall Financial Resource Strain  (CARDIA)    Difficulty of Paying Living Expenses: Not hard at all  Food Insecurity: No Food Insecurity (03/11/2024)   Hunger Vital Sign    Worried About Running Out of Food in the Last Year: Never true    Ran Out of Food in the Last Year: Never true  Transportation Needs: No Transportation Needs (03/11/2024)   PRAPARE - Administrator, Civil Service (Medical): No    Lack of Transportation (Non-Medical): No  Physical Activity: Sufficiently Active (06/08/2023)   Exercise Vital Sign    Days of Exercise per Week: 7 days    Minutes of Exercise per Session: 60 min  Stress: No Stress Concern Present (06/08/2023)   [employer] of Occupational Health - Occupational Stress Questionnaire    Feeling of Stress : Not at all  Social Connections: Socially Isolated (06/08/2023)   Social Connection and Isolation Panel    Frequency of Communication with Friends and Family: More than three times a week    Frequency of Social Gatherings with Friends and Family: More than three times a week    Attends Religious Services: Never    Database Administrator or Organizations: No    Attends Engineer, Structural: Not on file    Marital Status: Divorced  Intimate Partner Violence: Not At Risk (03/11/2024)   Humiliation, Afraid, Rape, and Kick questionnaire    Fear of Current or Ex-Partner: No    Emotionally Abused: No    Physically Abused: No    Sexually Abused: No        Review of Systems     Objective:   Physical Exam Vitals reviewed.  Constitutional:      Appearance: Normal appearance. He is normal weight.  Cardiovascular:     Rate and Rhythm: Normal rate and regular rhythm.     Heart sounds: Normal heart sounds.  Pulmonary:     Effort: Pulmonary effort is normal.     Breath sounds: Normal breath sounds.  Neurological:     Mental Status: He is alert.   Tender and sore 3 cm x 2 cm hematoma in the antecubital fossa of the right elbow.  Very tender to the touch         Assessment & Plan:  Insulin  dependent type 2 diabetes mellitus (HCC)  Abscess of right arm Although better, there is concern for residual infection.  I  anesthetized the skin overlying the abscess in his right antecubital fossa with 0.1% lidocaine .  I made a 1 cm vertical incision directly in the center of the hematoma and open the incision with a pair of hemostats.  The majority of the drainage was bloody however there was purulent material that was released.  The entire hematoma was evacuated.  The opening and abscess cavity was then cleansed thoroughly with Q-tips soaked in half and peroxide.  The wound was covered with petroleum gauze, 4 x 4 gauze, and wrapped in Coban.  Continue doxycycline  100 mg twice daily for 7 days and recheck in 1 week or sooner if worse.  Increase Lantus  to 30 units subcu daily to achieve fasting blood sugars closer to 130.  Continue 3 to 5 units with meals as long as 2-hour postprandial sugars are under 200.  Follow-up as planned with endocrinology

## 2024-04-07 ENCOUNTER — Ambulatory Visit: Admitting: Family Medicine

## 2024-05-07 ENCOUNTER — Other Ambulatory Visit: Payer: Self-pay | Admitting: Family Medicine

## 2024-05-07 NOTE — Telephone Encounter (Unsigned)
 Copied from CRM #[id number]. Topic: Clinical - Medication Refill >> May 07, 2024  4:17 PM Victoria B wrote: Medication: Continuous Glucose Sensor (DEXCOM G7 SENSOR) MISC  Has the patient contacted their pharmacy? no (Agent: If no, request that the patient contact the pharmacy for the refill. If patient does not wish to contact the pharmacy document the reason why and proceed with request.) (Agent: If yes, when and what did the pharmacy advise?)contact pcp  This is the patient's preferred pharmacy:  [facility] - [place], [place] - [address] [place] [place] [address] Phone: [phone] Fax: [phone]  Is this the correct pharmacy for this prescription? yes    Has the prescription been filled recently? no  Is the patient out of the medication? yes  Has the patient been seen for an appointment in the last year OR does the patient have an upcoming appointment? yes  Can we respond through MyChart? yes  Agent: Please be advised that Rx refills may take up to 3 business days. We ask that you follow-up with your pharmacy.

## 2024-05-09 ENCOUNTER — Other Ambulatory Visit (HOSPITAL_COMMUNITY): Payer: Self-pay

## 2024-05-09 MED ORDER — DEXCOM G7 SENSOR MISC: 3.0000 [IU] | 0 refills | Status: AC

## 2024-05-09 NOTE — Telephone Encounter (Signed)
 Requested medication (s) are due for refill today: yes  Requested medication (s) are on the active medication list: yes  Last refill:  03/14/24  Future visit scheduled: no  Notes to clinic:  Unable to refill per protocol, last refill by another provider.      Requested Prescriptions  Pending Prescriptions Disp Refills   Continuous Glucose Sensor (DEXCOM G7 SENSOR) MISC 3 each 0    Sig: Use as directed.     Endocrinology: Diabetes - Testing Supplies Passed - 05/09/2024 12:25 PM      Passed - Valid encounter within last 12 months    Recent Outpatient Visits           1 month ago Insulin  dependent type 2 diabetes mellitus (HCC)   [place] [facility] Duanne Butler DASEN, MD   3 months ago Insulin  dependent type 2 diabetes mellitus (HCC)   [place] [facility] Aletha Bene, MD   6 months ago Type 2 diabetes mellitus with hyperglycemia, with long-term current use of insulin  [facility])   [place] [facility] Duanne Butler DASEN, MD   7 months ago Type 2 diabetes mellitus with hyperglycemia, with long-term current use of insulin  [facility])   [place] [facility] Duanne Butler DASEN, MD   10 months ago Type 2 diabetes mellitus with hyperglycemia, with long-term current use of insulin  [facility])   [facility] Pickard, Butler DASEN, MD       Future Appointments             In 2 weeks Therisa Benton PARAS, NP [facility]

## 2024-05-13 ENCOUNTER — Other Ambulatory Visit (HOSPITAL_COMMUNITY): Payer: Self-pay

## 2024-05-21 ENCOUNTER — Telehealth: Payer: Self-pay

## 2024-05-21 NOTE — Telephone Encounter (Signed)
 Copied from CRM [id number]. Topic: Clinical - Prescription Issue >> May 21, 2024  9:57 AM Terri MATSU wrote: Reason for CRM: Patients sister Ronal is calling regarding patients medication DEXCOM G7 SENSOR; pharmacy stated they are just waiting on Dr.Pickards to sign off for it and patient needs medication seeing that he is out of it. Can we get that filled today if possible.

## 2024-05-26 NOTE — Patient Instructions (Incomplete)

## 2024-05-28 ENCOUNTER — Ambulatory Visit: Payer: Self-pay | Admitting: Nurse Practitioner

## 2024-05-28 DIAGNOSIS — Z794 Long term (current) use of insulin: Secondary | ICD-10-CM

## 2024-05-28 DIAGNOSIS — E1165 Type 2 diabetes mellitus with hyperglycemia: Secondary | ICD-10-CM

## 2024-05-28 NOTE — Progress Notes (Deleted)
 "                                                                        Endocrinology Consult Note       05/28/2024, 7:12 AM   Subjective:    Patient ID: Joshua Escobar, male    DOB: 1971-02-24.  NIEL PERETTI is being seen in consultation for management of currently uncontrolled symptomatic diabetes requested by  Duanne Butler DASEN, MD.   Past Medical History:  Diagnosis Date   MI (myocardial infarction) (HCC) 10/2019   Migraine headache    Type II diabetes mellitus (HCC)     Past Surgical History:  Procedure Laterality Date   BIOPSY  11/16/2020   Procedure: BIOPSY;  Surgeon: Eartha Angelia Sieving, MD;  Location: [facility];  Service: Gastroenterology;;   COLONOSCOPY WITH PROPOFOL  N/A 09/11/2020   Procedure: COLONOSCOPY WITH PROPOFOL ;  Surgeon: Eartha Angelia Sieving, MD;  Location: [facility];  Service: Gastroenterology;  Laterality: N/A;   ESOPHAGOGASTRODUODENOSCOPY (EGD) WITH PROPOFOL  N/A 11/16/2020   Procedure: ESOPHAGOGASTRODUODENOSCOPY (EGD) WITH PROPOFOL ;  Surgeon: Eartha Angelia Sieving, MD;  Location: [facility];  Service: Gastroenterology;  Laterality: N/A;  12:10   LEFT HEART CATH AND CORONARY ANGIOGRAPHY N/A 11/27/2016   Procedure: Left Heart Cath and Coronary Angiography;  Surgeon: Court Dorn PARAS, MD;  Location: [facility] INVASIVE CV LAB;  Service: Cardiovascular;  Laterality: N/A;   LEFT HEART CATH AND CORONARY ANGIOGRAPHY N/A 03/11/2024   Procedure: LEFT HEART CATH AND CORONARY ANGIOGRAPHY;  Surgeon: Elmira Newman PARAS, MD;  Location: [facility] CV LAB;  Service: Cardiovascular;  Laterality: N/A;    Social History   Socioeconomic History   Marital status: Single    Spouse name: Not on file   Number of children: 2   Years of education: 12   Highest education level: GED or equivalent  Occupational History   Occupation: Naval Architect   Occupation: BUILF [employer]    Employer: [employer]  Tobacco Use   Smoking status: Former     Current packs/day: 0.25    Average packs/day: 0.3 packs/day for 23.3 years (6.0 ttl pk-yrs)    Types: Cigarettes   Smokeless tobacco: Never  Vaping Use   Vaping status: Former   Substances: Nicotine, Flavoring  Substance and Sexual Activity   Alcohol use: Yes    Alcohol/week: 1.0 standard drink of alcohol    Types: 1 Cans of beer per week    Comment: weekends only   Drug use: No   Sexual activity: Not Currently  Other Topics Concern   Not on file  Social History Narrative   Fun/Hobby: Basketball, fish, bowling.    Social Drivers of Health   Tobacco Use: Medium Risk (03/17/2024)   Patient History    Smoking Tobacco Use: Former    Smokeless Tobacco Use: Never    Passive Exposure: Not on file  Financial Resource Strain: Low Risk (06/08/2023)   Overall Financial Resource Strain (CARDIA)    Difficulty of Paying Living Expenses: Not hard at all  Food Insecurity: No Food Insecurity (03/11/2024)   Epic    Worried About Radiation Protection Practitioner of Food in the Last Year: Never true    [employer] of Food in  the Last Year: Never true  Transportation Needs: No Transportation Needs (03/11/2024)   Epic    Lack of Transportation (Medical): No    Lack of Transportation (Non-Medical): No  Physical Activity: Sufficiently Active (06/08/2023)   Exercise Vital Sign    Days of Exercise per Week: 7 days    Minutes of Exercise per Session: 60 min  Stress: No Stress Concern Present (06/08/2023)   [employer] of Occupational Health - Occupational Stress Questionnaire    Feeling of Stress : Not at all  Social Connections: Socially Isolated (06/08/2023)   Social Connection and Isolation Panel    Frequency of Communication with Friends and Family: More than three times a week    Frequency of Social Gatherings with Friends and Family: More than three times a week    Attends Religious Services: Never    Database Administrator or Organizations: No    Attends Engineer, Structural: Not on file    Marital  Status: Divorced  Depression (PHQ2-9): Low Risk (10/12/2023)   Depression (PHQ2-9)    PHQ-2 Score: 0  Alcohol Screen: Not on file  Housing: Low Risk (03/11/2024)   Epic    Unable to Pay for Housing in the Last Year: No    Number of Times Moved in the Last Year: 0    Homeless in the Last Year: No  Utilities: Not At Risk (03/11/2024)   Epic    Threatened with loss of utilities: No  Health Literacy: Not on file    Family History  Problem Relation Age of Onset   Diabetes Mellitus II Sister    Diabetes Sister    Diabetes Mellitus II Brother    Diabetes Brother    Diabetes Mellitus II Mother    Diabetes Mother    Diabetes Mellitus II Father    Diabetes Father    Heart disease Father     Outpatient Encounter Medications as of 05/28/2024  Medication Sig   aspirin  EC 81 MG tablet Take 1 tablet (81 mg total) by mouth daily. Swallow whole.   cholestyramine  (QUESTRAN ) 4 g packet Take 1 packet (4 g total) by mouth at bedtime.   Continuous Glucose Sensor (DEXCOM G7 SENSOR) MISC Use as directed.   Glucagon  (BAQSIMI  ONE PACK) 3 MG/DOSE POWD Place 3 mg into the nose once as needed for up to 1 dose (low blood sugar).   insulin  glargine (LANTUS  SOLOSTAR) 100 UNIT/ML Solostar Pen Inject 25 Units into the skin daily. Inject 0.25mLs (25 Units total) into the skin at bedtime   insulin  lispro (HUMALOG  KWIKPEN) 100 UNIT/ML KwikPen Inject 3 Units into the skin 3 (three) times daily.   Insulin  Pen Needle 32G X 4 MM MISC Use to inject insulin  into skin as directed.   meclizine  (ANTIVERT ) 25 MG tablet Take 1 tablet (25 mg total) by mouth 3 (three) times daily as needed for dizziness (vertigo).   nystatin  (MYCOSTATIN /NYSTOP ) powder Apply topically 2 (two) times daily.   ondansetron  (ZOFRAN ) 4 MG tablet Take 1 tablet (4 mg total) by mouth every 6 (six) hours as needed for up to 30 doses for nausea or vomiting.   pantoprazole  (PROTONIX ) 40 MG tablet Take 1 tablet (40 mg total) by mouth 2 (two) times daily  before a meal.   polyethylene glycol (MIRALAX  / GLYCOLAX ) 17 g packet Take 17 g by mouth daily as needed for mild constipation.   rosuvastatin  (CRESTOR ) 10 MG tablet Take 1 tablet (10 mg total) by mouth daily.  No facility-administered encounter medications on file as of 05/28/2024.    ALLERGIES: Allergies[1]  VACCINATION STATUS: Immunization History  Administered Date(s) Administered   DTP 02/07/1972, 04/11/1972   Influenza,inj,Quad PF,6+ Mos 06/11/2017, 03/21/2018, 04/07/2019   OPV 02/07/1972, 04/11/1972   PFIZER(Purple Top)SARS-COV-2 Vaccination 11/05/2019, 12/03/2019   PNEUMOCOCCAL CONJUGATE-20 01/15/2024   Pneumococcal Polysaccharide-23 02/27/2019   Tdap 08/28/2017    Diabetes He presents for his initial diabetic visit. He has type 2 diabetes mellitus. Hypoglycemia symptoms include nervousness/anxiousness, sweats and tremors. There are no diabetic associated symptoms. There are no hypoglycemic complications. (DKA in past, pancreatitis) Risk factors for coronary artery disease include diabetes mellitus, family history, male sex, hypertension and tobacco exposure. Current diabetic treatment includes intensive insulin  program. He is compliant with treatment some of the time. His weight is fluctuating minimally. He is following a generally unhealthy diet. When asked about meal planning, he reported none. He has not had a previous visit with a dietitian. He participates in exercise intermittently. An ACE inhibitor/angiotensin II receptor blocker is not being taken. He does not see a podiatrist.Eye exam is current.     Review of systems  Constitutional: + Minimally fluctuating body weight, current There is no height or weight on file to calculate BMI., no fatigue, no subjective hyperthermia, no subjective hypothermia Eyes: no blurry vision, no xerophthalmia ENT: no sore throat, no nodules palpated in throat, no dysphagia/odynophagia, no hoarseness Cardiovascular: no chest pain, no  shortness of breath, no palpitations, no leg swelling Respiratory: no cough, no shortness of breath Gastrointestinal: no nausea/vomiting/diarrhea Musculoskeletal: no muscle/joint aches Skin: no rashes, no hyperemia Neurological: no tremors, no numbness, no tingling, no dizziness Psychiatric: no depression, no anxiety  Objective:     There were no vitals taken for this visit.  Wt Readings from Last 3 Encounters:  03/24/24 145 lb (65.8 kg)  03/14/24 146 lb 4.8 oz (66.4 kg)  01/15/24 162 lb (73.5 kg)     BP Readings from Last 3 Encounters:  03/24/24 138/84  03/17/24 (!) 133/113  03/14/24 96/63     Physical Exam- Limited  Constitutional:  There is no height or weight on file to calculate BMI. , not in acute distress, normal state of mind Eyes:  EOMI, no exophthalmos Neck: Supple Thyroid : No gross goiter Cardiovascular: RRR, no murmurs, rubs, or gallops, no edema Respiratory: Adequate breathing efforts, no crackles, rales, rhonchi, or wheezing Musculoskeletal: no gross deformities, strength intact in all four extremities, no gross restriction of joint movements Skin:  no rashes, no hyperemia Neurological: no tremor with outstretched hands   Diabetic Foot Exam - Simple   No data filed      CMP ( most recent) CMP     Component Value Date/Time   NA 135 03/17/2024 1123   K 5.3 (H) 03/17/2024 1123   CL 104 03/17/2024 1123   CO2 21 (L) 03/17/2024 1123   GLUCOSE 340 (H) 03/17/2024 1123   BUN 9 03/17/2024 1123   CREATININE 0.95 03/17/2024 1123   CREATININE 0.95 10/12/2023 1455   CALCIUM  8.9 03/17/2024 1123   PROT 7.5 03/17/2024 1123   ALBUMIN 4.0 03/17/2024 1123   AST 31 03/17/2024 1123   ALT 23 03/17/2024 1123   ALKPHOS 86 03/17/2024 1123   BILITOT 0.4 03/17/2024 1123   EGFR 97 10/12/2023 1455   GFRNONAA >60 03/17/2024 1123     Diabetic Labs (most recent): Lab Results  Component Value Date   HGBA1C 12.7 (H) 03/11/2024   HGBA1C 11.9 (H) 10/12/2023   HGBA1C  >  14.0 (H) 06/08/2023   MICROALBUR 0.2 11/19/2019     Lipid Panel ( most recent) Lipid Panel     Component Value Date/Time   CHOL 142 03/12/2024 0422   CHOL 139 11/07/2023 1457   TRIG 59 03/12/2024 0422   HDL 70 03/12/2024 0422   HDL 82 11/07/2023 1457   CHOLHDL 2.0 03/12/2024 0422   VLDL 12 03/12/2024 0422   LDLCALC 60 03/12/2024 0422   LDLCALC 43 11/07/2023 1457   LDLCALC 95 11/19/2019 1237   LABVLDL 14 11/07/2023 1457      Lab Results  Component Value Date   TSH 2.562 06/14/2023   TSH 1.530 11/16/2020   TSH 1.13 11/19/2019   TSH 2.01 08/29/2018   TSH 1.225 11/25/2016   TSH 1.356 06/30/2013           Assessment & Plan:   1) Type 2 diabetes mellitus with hyperglycemia, with long-term current use of insulin  (HCC) (Primary)    - Vinie DELENA Diver has currently uncontrolled symptomatic type 2 DM since *** years of age, with most recent A1c of 12.7 %.   -Recent labs reviewed.  - I had a long discussion with him about the progressive nature of diabetes and the pathology behind its complications. -his diabetes is complicated by hx pancreatitis and DKA and he remains at a high risk for more acute and chronic complications which include CAD, CVA, CKD, retinopathy, and neuropathy. These are all discussed in detail with him.  The following Lifestyle Medicine recommendations according to American College of Lifestyle Medicine [facility]) were discussed and offered to patient and he agrees to start the journey:  A. Whole Foods, Plant-based plate comprising of fruits and vegetables, plant-based proteins, whole-grain carbohydrates was discussed in detail with the patient.   A list for source of those nutrients were also provided to the patient.  Patient will use only water  or unsweetened tea for hydration. B.  The need to stay away from risky substances including alcohol, smoking; obtaining 7 to 9 hours of restorative sleep, at least 150 minutes of moderate intensity exercise weekly,  the importance of healthy social connections,  and stress reduction techniques were discussed. C.  A full color page of  Calorie density of various food groups per pound showing examples of each food groups was provided to the patient.  - I have counseled him on diet and weight management by adopting a carbohydrate restricted/protein rich diet. Patient is encouraged to switch to unprocessed or minimally processed complex starch and increased protein intake (animal or plant source), fruits, and vegetables. -  he is advised to stick to a routine mealtimes to eat 3 meals a day and avoid unnecessary snacks (to snack only to correct hypoglycemia).   - he acknowledges that there is a room for improvement in his food and drink choices. - Suggestion is made for him to avoid simple carbohydrates from his diet including Cakes, Sweet Desserts, Ice Cream, Soda (diet and regular), Sweet Tea, Candies, Chips, Cookies, Store Bought Juices, Alcohol in Excess of 1-2 drinks a day, Artificial Sweeteners, Coffee Creamer, and Sugar-free Products. This will help patient to have more stable blood glucose profile and potentially avoid unintended weight gain.  - I have approached him with the following individualized plan to manage his diabetes and patient agrees:    -he is encouraged to start/continue monitoring glucose 4 times daily (using his CGM), before meals and before bed, and to call the clinic if he has readings less than 70 or  above 300 for 3 tests in a row.  - he is warned not to take insulin  without proper monitoring per orders. - Adjustment parameters are given to him for hypo and hyperglycemia in writing.  - he is advised to continue ***, therapeutically suitable for patient .  - he is not an ideal candidate for incretin therapy given hx of pancreatitis.  - Specific targets for  A1c; LDL, HDL, and Triglycerides were discussed with the patient.  2) Blood Pressure /Hypertension:  his blood pressure is  controlled to target.   he is advised to continue his current medications as prescribed by PCP.  3) Lipids/Hyperlipidemia:    Review of his recent lipid panel from 03/12/24 showed controlled LDL at 60 .  he is advised to continue Crestor  10 mg daily at bedtime.  Side effects and precautions discussed with him.  4)  Weight/Diet:  his There is no height or weight on file to calculate BMI.  -  clearly complicating his diabetes care.   he is NOT a candidate for major weight loss. I discussed with him the fact that loss of 5 - 10% of his  current body weight will have the most impact on his diabetes management.  Exercise, and detailed carbohydrates information provided  -  detailed on discharge instructions.  5) Chronic Care/Health Maintenance: -he is not on ACEI/ARB and is on Statin medications and is encouraged to initiate and continue to follow up with Ophthalmology, Dentist, Podiatrist at least yearly or according to recommendations, and advised to *** stay away from smoking. I have recommended yearly flu vaccine and pneumonia vaccine at least every 5 years; moderate intensity exercise for up to 150 minutes weekly; and sleep for at least 7 hours a day.  - he is advised to maintain close follow up with Duanne Butler DASEN, MD for primary care needs, as well as his other providers for optimal and coordinated care.   - Time spent in this patient care: 60 min, which was spent in counseling him about his diabetes and the rest reviewing his blood glucose logs, discussing his hypoglycemia and hyperglycemia episodes, reviewing his current and previous labs/studies (including abstraction from other facilities) and medications doses and developing a long term treatment plan based on the latest standards of care/guidelines; and documenting his care.    Please refer to Patient Instructions for Blood Glucose Monitoring and Insulin /Medications Dosing Guide in media tab for additional information. Please also refer  to Patient Self Inventory in the Media tab for reviewed elements of pertinent patient history.  Vinie DELENA Diver participated in the discussions, expressed understanding, and voiced agreement with the above plans.  All questions were answered to his satisfaction. he is encouraged to contact clinic should he have any questions or concerns prior to his return visit.     Follow up plan: - No follow-ups on file.    Benton Rio, [facility] [address] [place], [place] [address] Phone: [phone] Fax: [phone]  05/28/2024, 7:12 AM       [1] No Known Allergies  "

## 2024-07-03 ENCOUNTER — Ambulatory Visit: Admitting: Family Medicine

## 2024-07-03 ENCOUNTER — Encounter: Payer: Self-pay | Admitting: Family Medicine

## 2024-07-03 VITALS — BP 120/78 | HR 84 | Temp 98.4°F | Ht 68.0 in | Wt 157.0 lb

## 2024-07-03 DIAGNOSIS — Z794 Long term (current) use of insulin: Secondary | ICD-10-CM

## 2024-07-03 DIAGNOSIS — E1165 Type 2 diabetes mellitus with hyperglycemia: Secondary | ICD-10-CM

## 2024-07-03 NOTE — Progress Notes (Signed)
 "  Subjective:    Patient ID: Joshua Escobar, male    DOB: [birth year], 54 y.o.   MRN: [id number]  HPI Patient has a history of insulin -dependent diabetes mellitus.  He has been admitted multiple times with DKA which has been due to not taking his insulin .  He states that he has been taking his insulin  consistently.  He is supposedly on 30 units of Lantus  once a day and 8 units of NovoLog  with meals 3 times a day.  I reviewed his glucose meter.  His time in range is 20%.  80% of the time he is above range.  The last 2 weeks he has been averaging well above 291 and close to 400 for the last few days.  He states that he is taking his medication.  He reports polyuria and dry mouth.  Over the last 14 days, his blood sugars have been between 300 and 400 consistently with no episodes of hypoglycemia or even normal sugars despite taking 30 units of Lantus  and 6 units of rapid acting insulin  3 times a day.  Ironically, the patient has hypoglycemia in the hospital and given 30 units of Lantus   He reports numbness in his right hand for 3 weeks.  He has a positive Tinel's sign.  He has a positive Phalen sign.  The numbness extends from his wrist to his fingertips only in the right hand.  There is no muscle weakness.  Muscle strength is 5/5 equal and symmetric in the upper extremities.  He has normal biceps reflex, biceps reflex, and brachial radialis reflex Past Medical History:  Diagnosis Date   MI (myocardial infarction) (HCC) 10/2019   Migraine headache    Type II diabetes mellitus (HCC)    Past Surgical History:  Procedure Laterality Date   BIOPSY  11/16/2020   Procedure: BIOPSY;  Surgeon: Eartha Angelia Sieving, MD;  Location: [facility];  Service: Gastroenterology;;   COLONOSCOPY WITH PROPOFOL  N/A 09/11/2020   Procedure: COLONOSCOPY WITH PROPOFOL ;  Surgeon: Eartha Angelia Sieving, MD;  Location: [facility];  Service: Gastroenterology;  Laterality: N/A;   ESOPHAGOGASTRODUODENOSCOPY (EGD) WITH  PROPOFOL  N/A 11/16/2020   Procedure: ESOPHAGOGASTRODUODENOSCOPY (EGD) WITH PROPOFOL ;  Surgeon: Eartha Angelia Sieving, MD;  Location: [facility];  Service: Gastroenterology;  Laterality: N/A;  12:10   LEFT HEART CATH AND CORONARY ANGIOGRAPHY N/A 11/27/2016   Procedure: Left Heart Cath and Coronary Angiography;  Surgeon: Court Dorn PARAS, MD;  Location: [facility];  Service: Cardiovascular;  Laterality: N/A;   LEFT HEART CATH AND CORONARY ANGIOGRAPHY N/A 03/11/2024   Procedure: LEFT HEART CATH AND CORONARY ANGIOGRAPHY;  Surgeon: Elmira Newman PARAS, MD;  Location: [facility];  Service: Cardiovascular;  Laterality: N/A;   Current Outpatient Medications on File Prior to Visit  Medication Sig Dispense Refill   cholestyramine  (QUESTRAN ) 4 g packet Take 1 packet (4 g total) by mouth at bedtime. 30 each 12   Continuous Glucose Sensor (DEXCOM G7 SENSOR) MISC Use as directed. 3 each 0   Glucagon  (BAQSIMI  ONE PACK) 3 MG/DOSE POWD Place 3 mg into the nose once as needed for up to 1 dose (low blood sugar). 6 each 0   insulin  glargine (LANTUS  SOLOSTAR) 100 UNIT/ML Solostar Pen Inject 25 Units into the skin daily. Inject 0.25mLs (25 Units total) into the skin at bedtime 15 mL PRN   insulin  lispro (HUMALOG  KWIKPEN) 100 UNIT/ML KwikPen Inject 3 Units into the skin 3 (three) times daily. 15 mL 11  pantoprazole  (PROTONIX ) 40 MG tablet Take 1 tablet (40 mg total) by mouth 2 (two) times daily before a meal. 30 tablet 1   polyethylene glycol (MIRALAX  / GLYCOLAX ) 17 g packet Take 17 g by mouth daily as needed for mild constipation. 14 each 0   rosuvastatin  (CRESTOR ) 10 MG tablet Take 1 tablet (10 mg total) by mouth daily. 30 tablet 11   No current facility-administered medications on file prior to visit.   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    Social Drivers of Health   Tobacco Use: Medium Risk (07/03/2024)   Patient History    Smoking Tobacco Use: Former    Smokeless Tobacco Use: Never    Passive Exposure: Not on file  Financial Resource Strain: Low Risk (06/08/2023)   Overall Financial Resource Strain (CARDIA)    Difficulty of Paying Living Expenses: Not hard at all  Food Insecurity: No Food Insecurity (03/11/2024)   Epic    Worried About Programme Researcher, Broadcasting/film/video in the Last Year: Never true    Ran Out of Food in the Last Year: Never true  Transportation Needs: No Transportation Needs (03/11/2024)   Epic    Lack of Transportation (Medical): No    Lack of Transportation (Non-Medical): No  Physical Activity: Sufficiently Active (06/08/2023)   Exercise Vital Sign    Days of Exercise per Week: 7 days    Minutes of Exercise per Session: 60 min  Stress: No Stress Concern Present (06/08/2023)   [employer] of Occupational Health - Occupational Stress Questionnaire    Feeling of Stress : Not at all  Social Connections: Socially Isolated (06/08/2023)   Social Connection and Isolation Panel    Frequency of Communication with Friends and Family: More than three times a week    Frequency of Social Gatherings with Friends and  Family: More than three times a week    Attends Religious Services: Never    Database Administrator or Organizations: No    Attends Banker Meetings: Not on file    Marital Status: Divorced  Intimate Partner Violence: Not At Risk (03/11/2024)   Epic    Fear of Current or Ex-Partner: No    Emotionally Abused: No    Physically Abused: No    Sexually Abused: No  Depression (PHQ2-9): Low Risk (10/12/2023)   Depression (PHQ2-9)    PHQ-2 Score: 0  Alcohol Screen: Not on file  Housing: Low Risk (03/11/2024)   Epic    Unable to  Pay for Housing in the Last Year: No    Number of Times Moved in the Last Year: 0    Homeless in the Last Year: No  Utilities: Not At Risk (03/11/2024)   Epic    Threatened with loss of utilities: No  Health Literacy: Not on file        Review of Systems     Objective:   Physical Exam Vitals reviewed.  Constitutional:      Appearance: Normal appearance. He is normal weight.  Cardiovascular:     Rate and Rhythm: Normal rate and regular rhythm.     Heart sounds: Normal heart sounds.  Pulmonary:     Effort: Pulmonary effort is normal.     Breath sounds: Normal breath sounds.  Neurological:     Mental Status: He is alert.        Assessment & Plan:  Type 2 diabetes mellitus with hyperglycemia, with long-term current use of insulin  (HCC) - Plan: Basic Metabolic Panel Without GFR Patient appears to have carpal tunnel syndrome.  However his sugars are so out of control that we need to get this manage before we can send the patient for surgery to correct this.  I question if he is actually taking his insulin  based on his previous hospitalization.  His sugars are out of control.  He insist that he is taking 30 units of Lantus  daily.  For that reason I have instructed him to increase to 40 units of Lantus  daily and monitor his blood sugar.  He can continue taking 6 units of rapid insulin  with meals.  Will continue to increase his basal insulin   until his sugars fall below 200.  We will make gross adjustments every 2 to 3 days until sugars are less than 200.  He will notify me every 2 to 3 days how his sugars are doing.  Check BMP to monitor for signs of DKA. "

## 2024-07-04 ENCOUNTER — Ambulatory Visit: Payer: Self-pay | Admitting: Family Medicine

## 2024-07-04 LAB — BASIC METABOLIC PANEL WITHOUT GFR
BUN: 21 mg/dL (ref 7–25)
CO2: 28 mmol/L (ref 20–32)
Calcium: 8.9 mg/dL (ref 8.6–10.3)
Chloride: 101 mmol/L (ref 98–110)
Creat: 1.17 mg/dL (ref 0.70–1.30)
Glucose, Bld: 545 mg/dL (ref 65–99)
Potassium: 5 mmol/L (ref 3.5–5.3)
Sodium: 136 mmol/L (ref 135–146)
